# Patient Record
Sex: Female | Born: 1950 | Race: White | Hispanic: No | Smoking: Former smoker
Health system: Southern US, Community
[De-identification: ages and names within clinical notes are randomized; demographics above are authoritative.]

## PROBLEM LIST (undated history)

## (undated) DIAGNOSIS — J449 Chronic obstructive pulmonary disease, unspecified: Secondary | ICD-10-CM

## (undated) DIAGNOSIS — H9190 Unspecified hearing loss, unspecified ear: Secondary | ICD-10-CM

## (undated) DIAGNOSIS — C801 Malignant (primary) neoplasm, unspecified: Secondary | ICD-10-CM

## (undated) DIAGNOSIS — I10 Essential (primary) hypertension: Secondary | ICD-10-CM

## (undated) DIAGNOSIS — E119 Type 2 diabetes mellitus without complications: Secondary | ICD-10-CM

## (undated) DIAGNOSIS — M549 Dorsalgia, unspecified: Secondary | ICD-10-CM

## (undated) DIAGNOSIS — M5416 Radiculopathy, lumbar region: Secondary | ICD-10-CM

## (undated) DIAGNOSIS — G8929 Other chronic pain: Secondary | ICD-10-CM

## (undated) DIAGNOSIS — F419 Anxiety disorder, unspecified: Secondary | ICD-10-CM

## (undated) HISTORY — PX: TONSILLECTOMY: SUR1361

---

## 2000-08-24 ENCOUNTER — Encounter: Admission: RE | Admit: 2000-08-24 | Discharge: 2000-08-24 | Payer: Self-pay | Admitting: Internal Medicine

## 2000-09-06 ENCOUNTER — Ambulatory Visit (HOSPITAL_COMMUNITY): Admission: RE | Admit: 2000-09-06 | Discharge: 2000-09-06 | Payer: Self-pay | Admitting: Internal Medicine

## 2000-09-14 ENCOUNTER — Encounter: Admission: RE | Admit: 2000-09-14 | Discharge: 2000-09-14 | Payer: Self-pay | Admitting: Internal Medicine

## 2000-09-20 ENCOUNTER — Encounter: Admission: RE | Admit: 2000-09-20 | Discharge: 2000-09-27 | Payer: Self-pay | Admitting: Internal Medicine

## 2000-10-19 ENCOUNTER — Encounter: Admission: RE | Admit: 2000-10-19 | Discharge: 2000-10-19 | Payer: Self-pay | Admitting: Hematology and Oncology

## 2001-01-05 ENCOUNTER — Encounter: Admission: RE | Admit: 2001-01-05 | Discharge: 2001-01-05 | Payer: Self-pay | Admitting: Internal Medicine

## 2001-02-27 ENCOUNTER — Encounter: Payer: Self-pay | Admitting: Emergency Medicine

## 2001-02-27 ENCOUNTER — Emergency Department (HOSPITAL_COMMUNITY): Admission: EM | Admit: 2001-02-27 | Discharge: 2001-02-27 | Payer: Self-pay | Admitting: Emergency Medicine

## 2001-03-01 ENCOUNTER — Encounter: Admission: RE | Admit: 2001-03-01 | Discharge: 2001-03-01 | Payer: Self-pay | Admitting: Internal Medicine

## 2001-03-01 ENCOUNTER — Ambulatory Visit (HOSPITAL_COMMUNITY): Admission: RE | Admit: 2001-03-01 | Discharge: 2001-03-01 | Payer: Self-pay | Admitting: Internal Medicine

## 2001-05-17 ENCOUNTER — Encounter: Admission: RE | Admit: 2001-05-17 | Discharge: 2001-05-17 | Payer: Self-pay | Admitting: Internal Medicine

## 2001-06-12 ENCOUNTER — Emergency Department (HOSPITAL_COMMUNITY): Admission: EM | Admit: 2001-06-12 | Discharge: 2001-06-12 | Payer: Self-pay | Admitting: Emergency Medicine

## 2001-06-12 ENCOUNTER — Encounter: Payer: Self-pay | Admitting: *Deleted

## 2001-08-05 ENCOUNTER — Emergency Department (HOSPITAL_COMMUNITY): Admission: EM | Admit: 2001-08-05 | Discharge: 2001-08-05 | Payer: Self-pay | Admitting: Emergency Medicine

## 2001-08-27 ENCOUNTER — Encounter: Admission: RE | Admit: 2001-08-27 | Discharge: 2001-08-27 | Payer: Self-pay | Admitting: Internal Medicine

## 2001-08-30 ENCOUNTER — Emergency Department (HOSPITAL_COMMUNITY): Admission: EM | Admit: 2001-08-30 | Discharge: 2001-08-30 | Payer: Self-pay

## 2001-11-12 ENCOUNTER — Encounter: Admission: RE | Admit: 2001-11-12 | Discharge: 2001-11-12 | Payer: Self-pay | Admitting: Internal Medicine

## 2002-06-07 ENCOUNTER — Encounter: Admission: RE | Admit: 2002-06-07 | Discharge: 2002-06-07 | Payer: Self-pay | Admitting: Internal Medicine

## 2002-07-08 ENCOUNTER — Encounter: Admission: RE | Admit: 2002-07-08 | Discharge: 2002-07-08 | Payer: Self-pay | Admitting: Internal Medicine

## 2002-09-09 ENCOUNTER — Emergency Department (HOSPITAL_COMMUNITY): Admission: EM | Admit: 2002-09-09 | Discharge: 2002-09-09 | Payer: Self-pay | Admitting: Emergency Medicine

## 2002-11-18 ENCOUNTER — Encounter: Admission: RE | Admit: 2002-11-18 | Discharge: 2002-11-18 | Payer: Self-pay | Admitting: Internal Medicine

## 2003-10-02 ENCOUNTER — Encounter: Admission: RE | Admit: 2003-10-02 | Discharge: 2003-10-02 | Payer: Self-pay | Admitting: Internal Medicine

## 2004-02-02 ENCOUNTER — Emergency Department (HOSPITAL_COMMUNITY): Admission: EM | Admit: 2004-02-02 | Discharge: 2004-02-02 | Payer: Self-pay | Admitting: Emergency Medicine

## 2004-02-02 IMAGING — CR DG KNEE COMPLETE 4+V*R*
4 series · 4 of 4 positions shown · non-contrast
Comparison: none

CLINICAL DATA: Right knee pain.  
 RIGHT KNEE FOUR VIEWS
 Mild to moderate degenerative changes with joint space narrowing, osteophytosis in all three compartments.  Minimal irregularity of the tibial spines, likely degenerative.  Small knee effusion is present.  No acute fracture, subluxation or dislocation.  
 IMPRESSION
 Mild to moderate degenerative changes in the right knee with small knee effusion.  No definite acute bony abnormality.  Consider further evaluation or follow-up as indicated.

[view not recorded (1 of 4)]
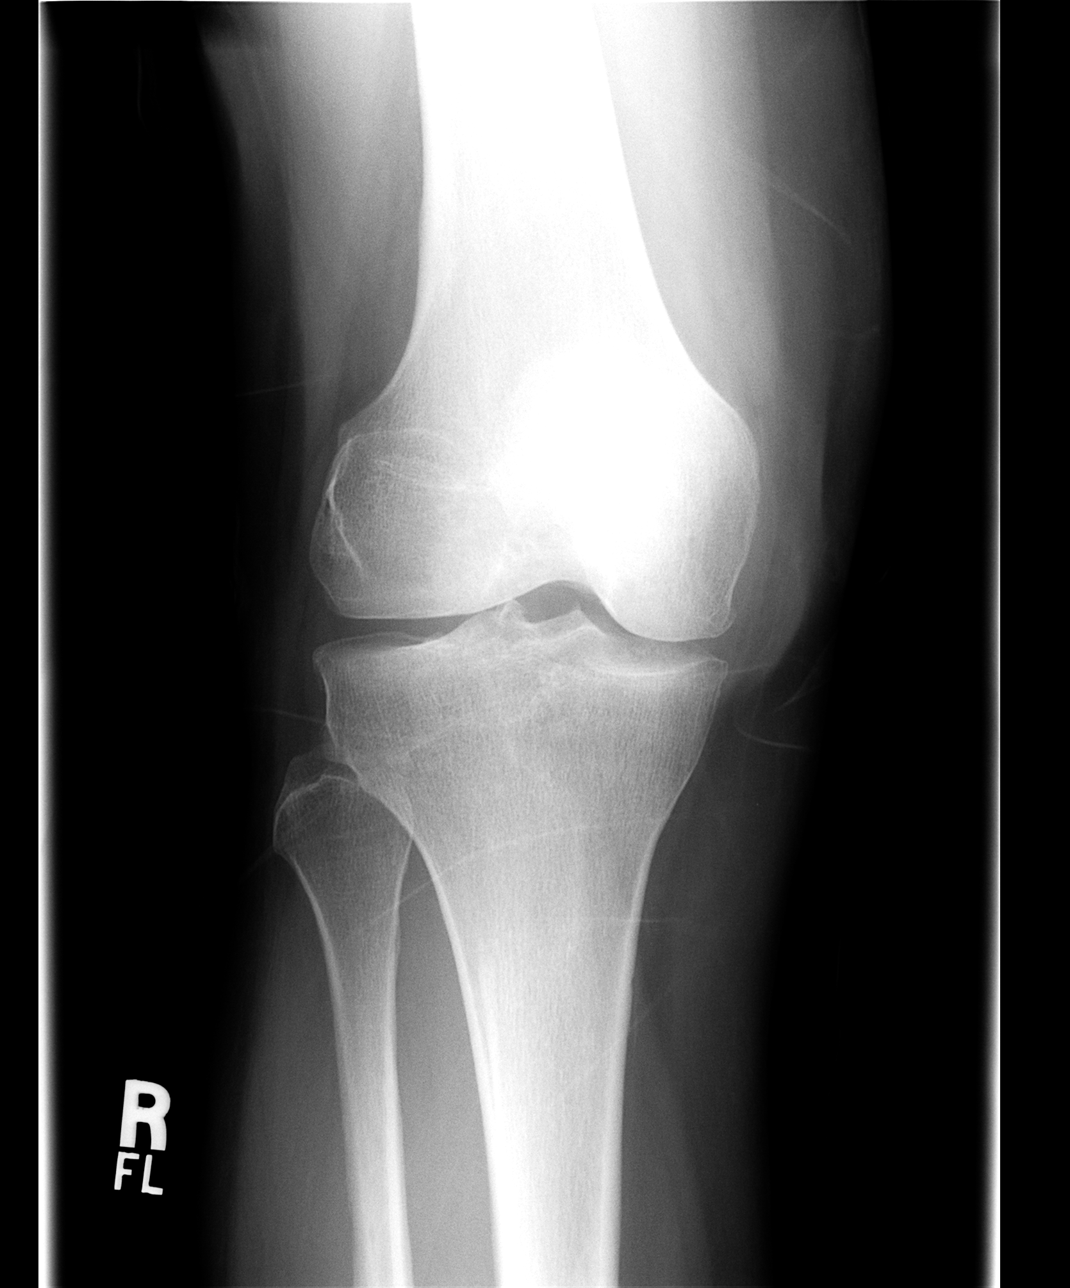

[view not recorded (2 of 4)]
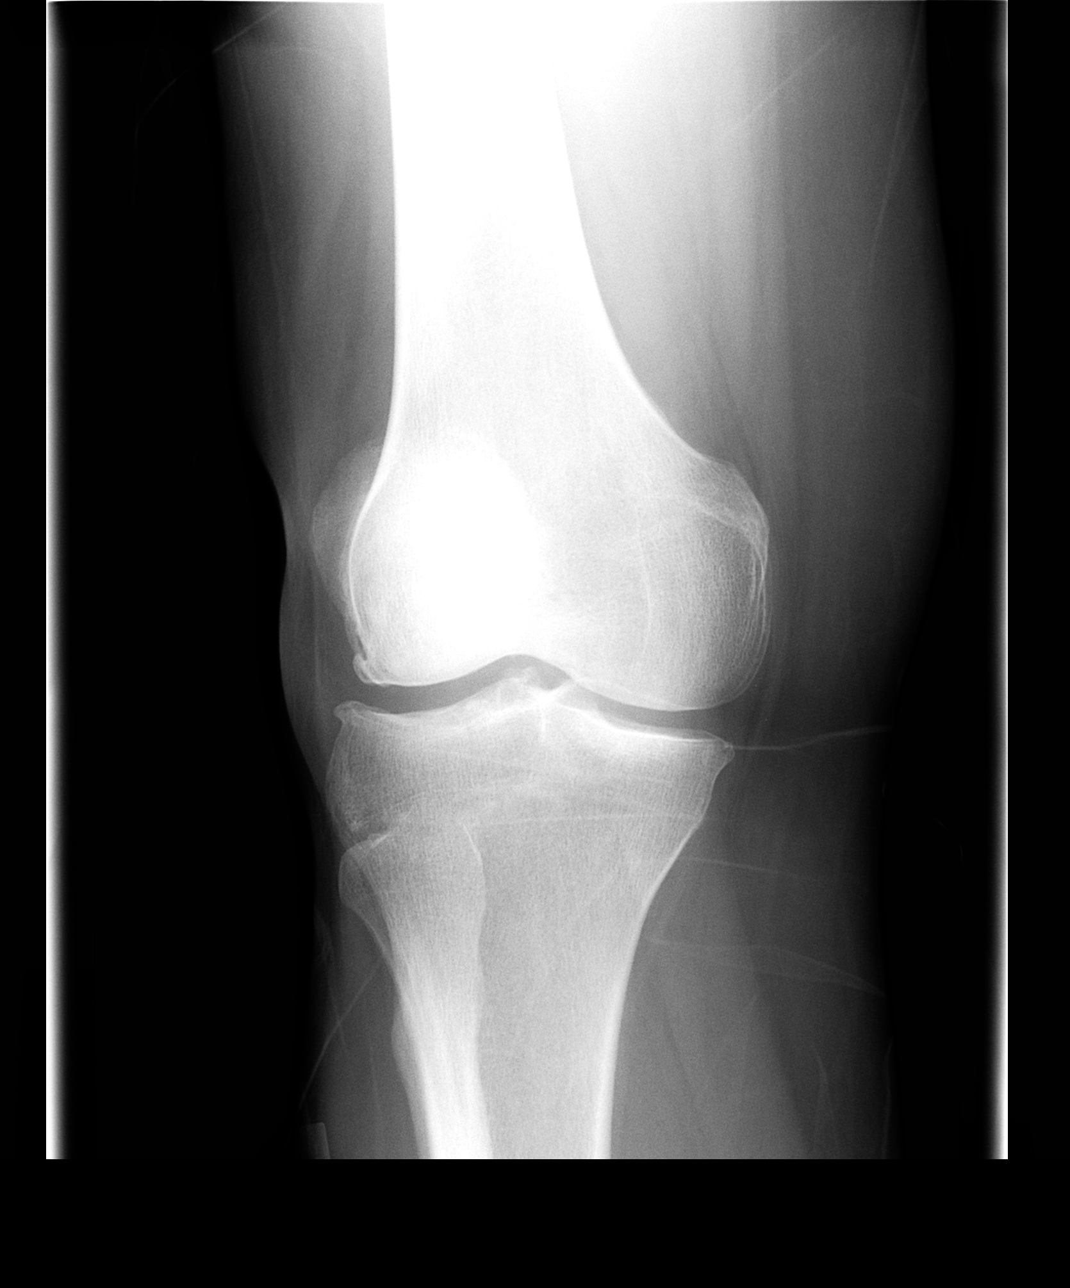

[view not recorded (3 of 4)]
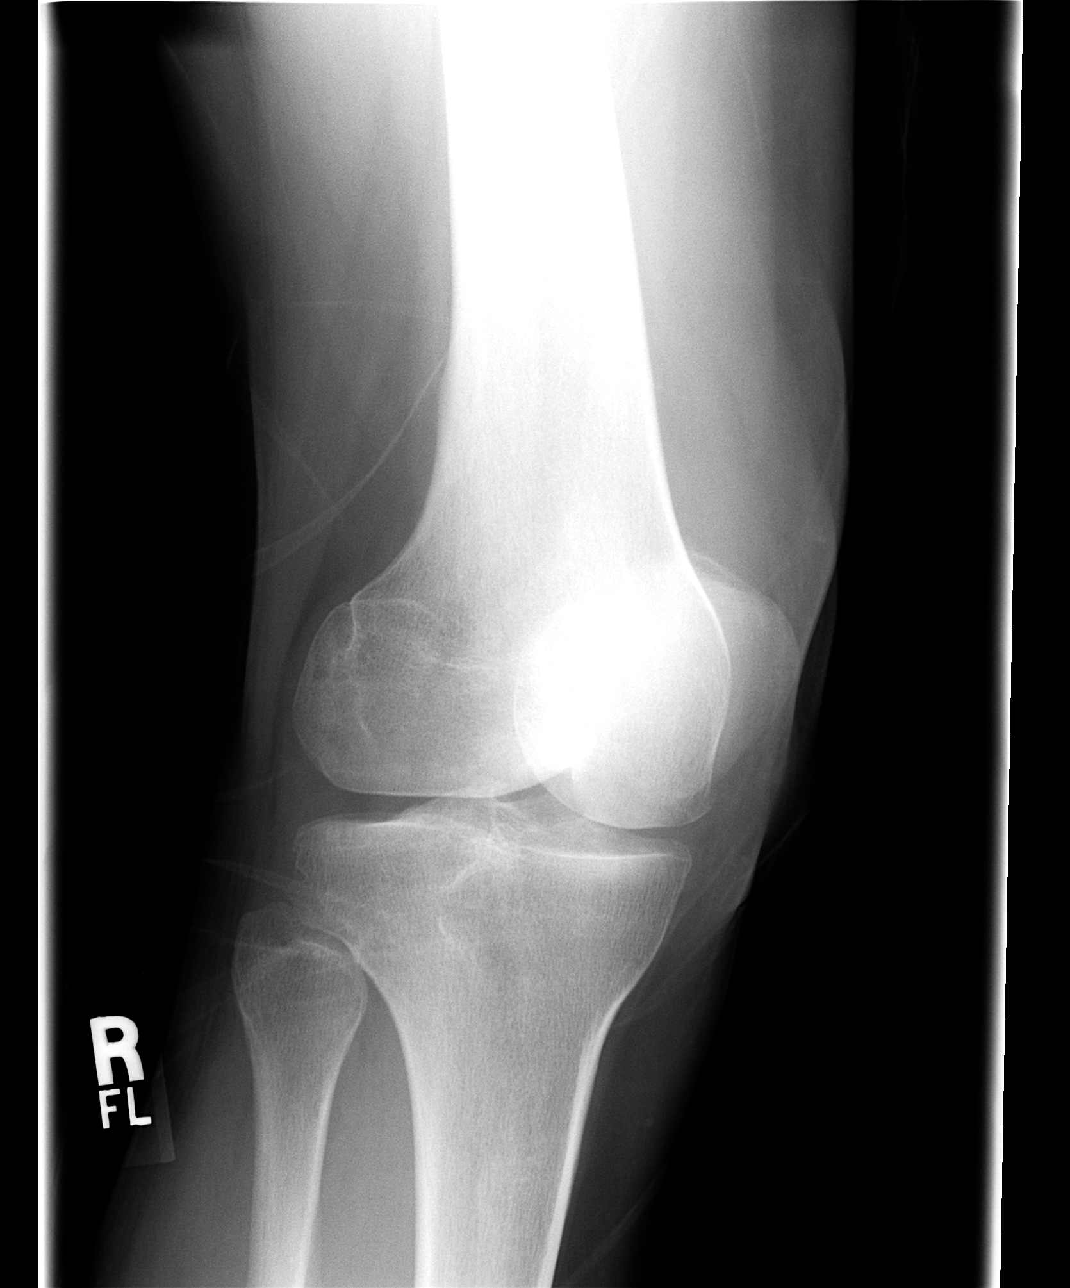

[view not recorded (4 of 4)]
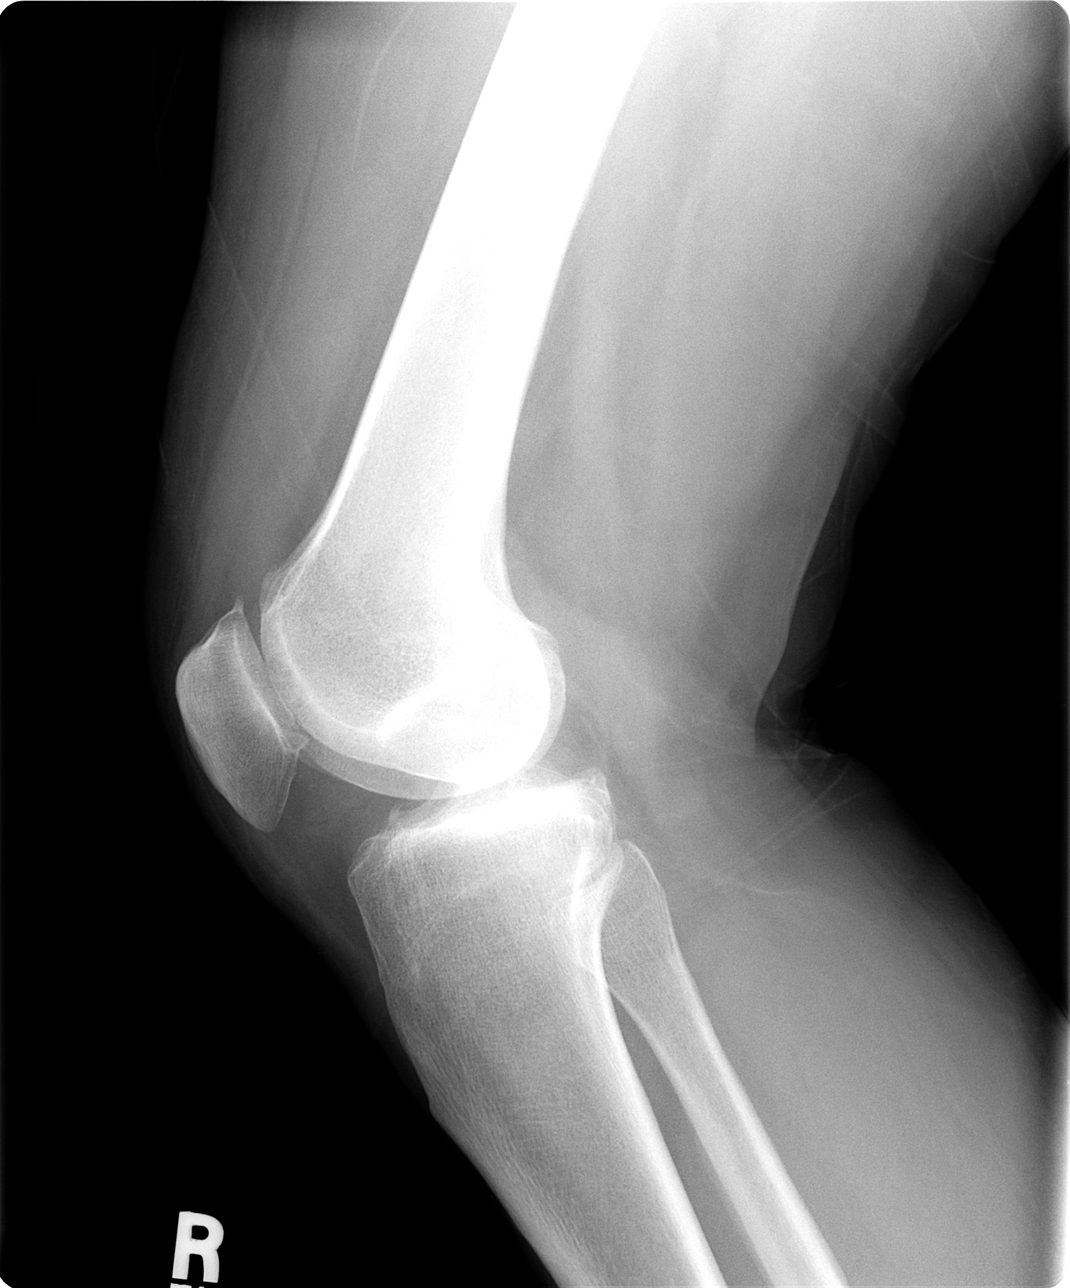

[4 of 4 positions shown; findings below may reference images not displayed]

## 2004-06-21 ENCOUNTER — Ambulatory Visit (HOSPITAL_COMMUNITY): Admission: RE | Admit: 2004-06-21 | Discharge: 2004-06-21 | Payer: Self-pay | Admitting: Internal Medicine

## 2004-06-21 IMAGING — CR DG LUMBAR SPINE COMPLETE 4+V
5 series · 5 of 5 positions shown · non-contrast
Comparison: none

CLINICAL DATA: Low back and knee pain.
RIGHT KNEE FOUR VIEW
There are mild degenerative changes present with early joint space narrowing and osteophyte formation.  Small joint effusion present.  No acute bony abnormality.  Specifically, no evidence of fracture, subluxation, or dislocation.  Degenerative changes are stable since [DATE].  Previously described small joint effusion has decreased on today?s study.
IMPRESSION
Early degenerative changes.  Small joint effusion.  No acute bony abnormality.
LUMBAR SPINE FOUR VIEWS
There are degenerative disc changes with disc space narrowing and osteophyte formation most pronounced at L2-3 and L5-S1.  Early vacuum disc noted at these levels as well.  No evidence of fracture or malalignment.  
Degenerative disc disease L2-3 and L5-S1.  No acute bony abnormality.

[view not recorded (1 of 5)]
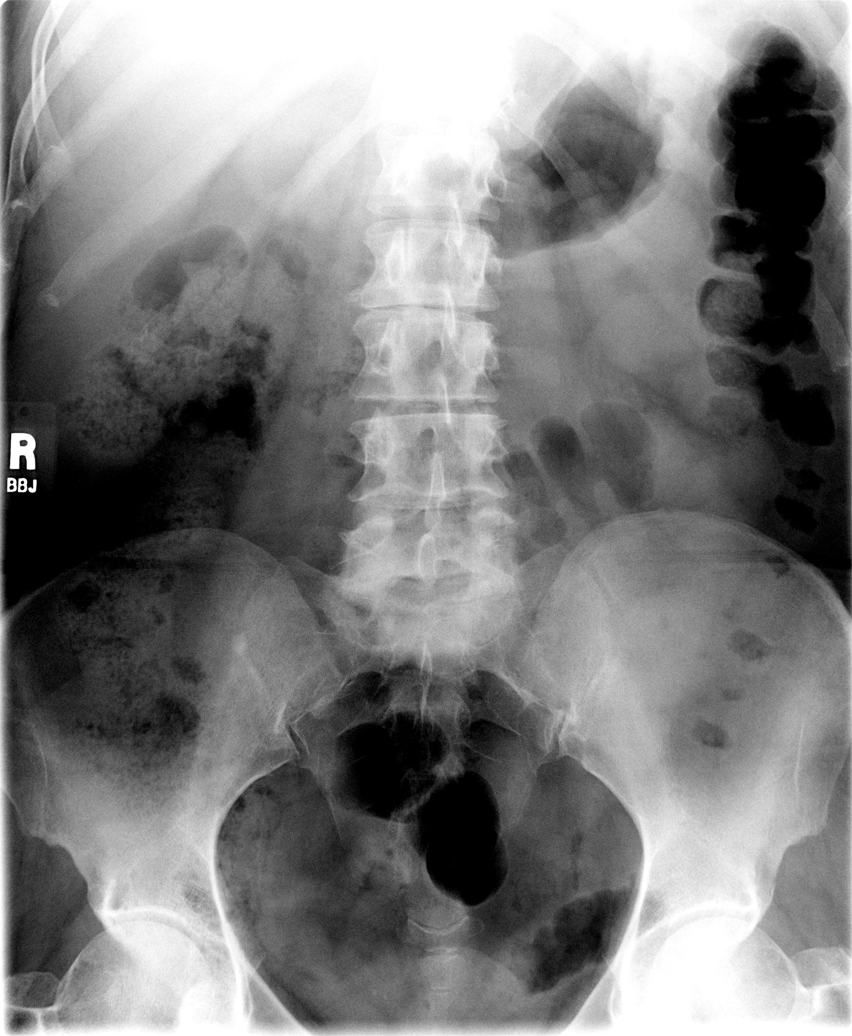

[view not recorded (2 of 5)]
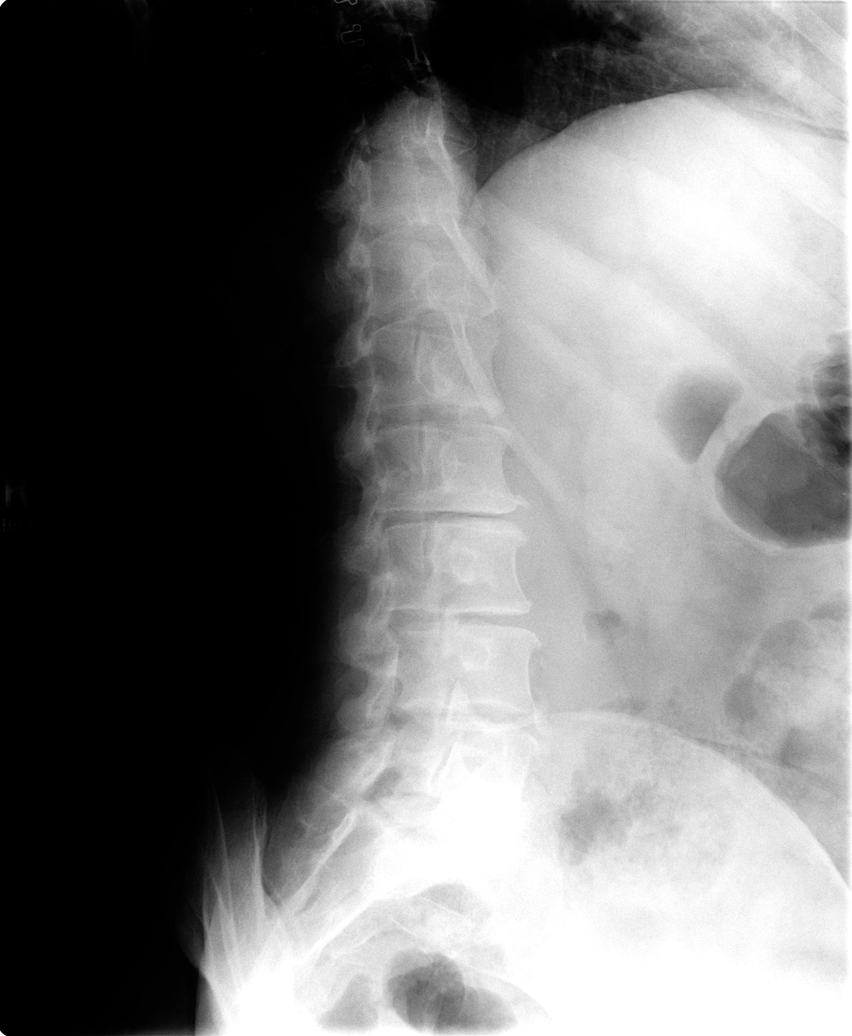

[view not recorded (3 of 5)]
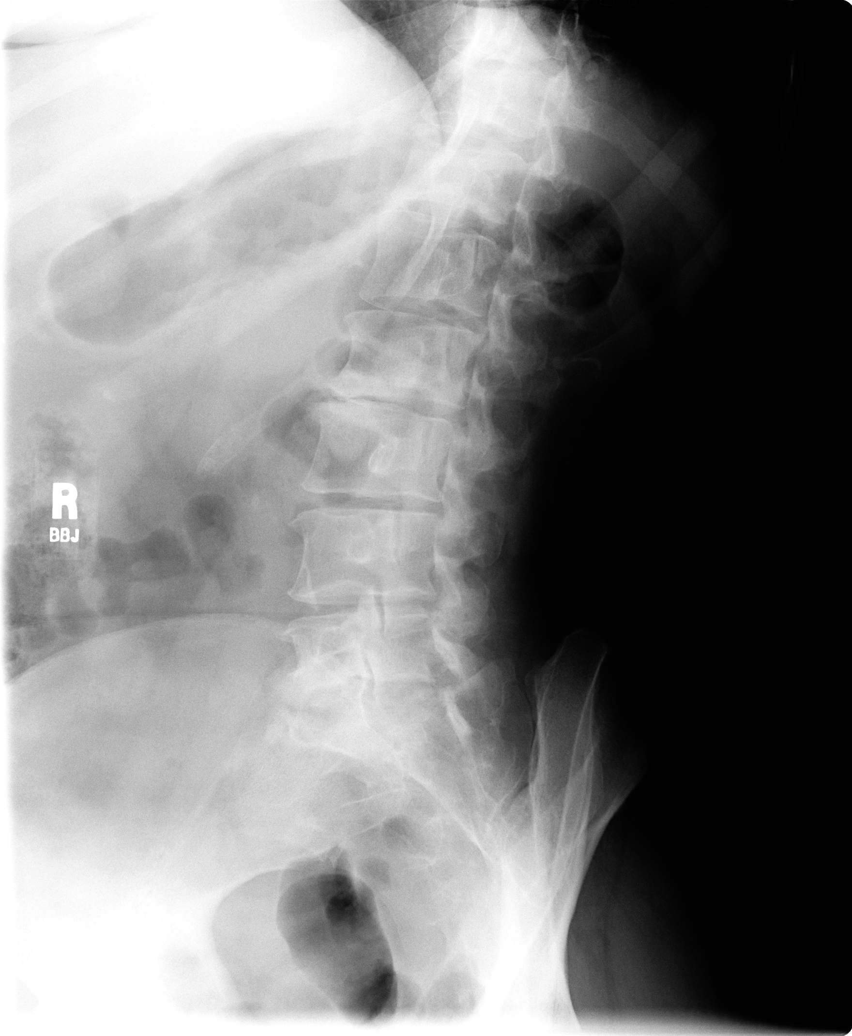

[view not recorded (4 of 5)]
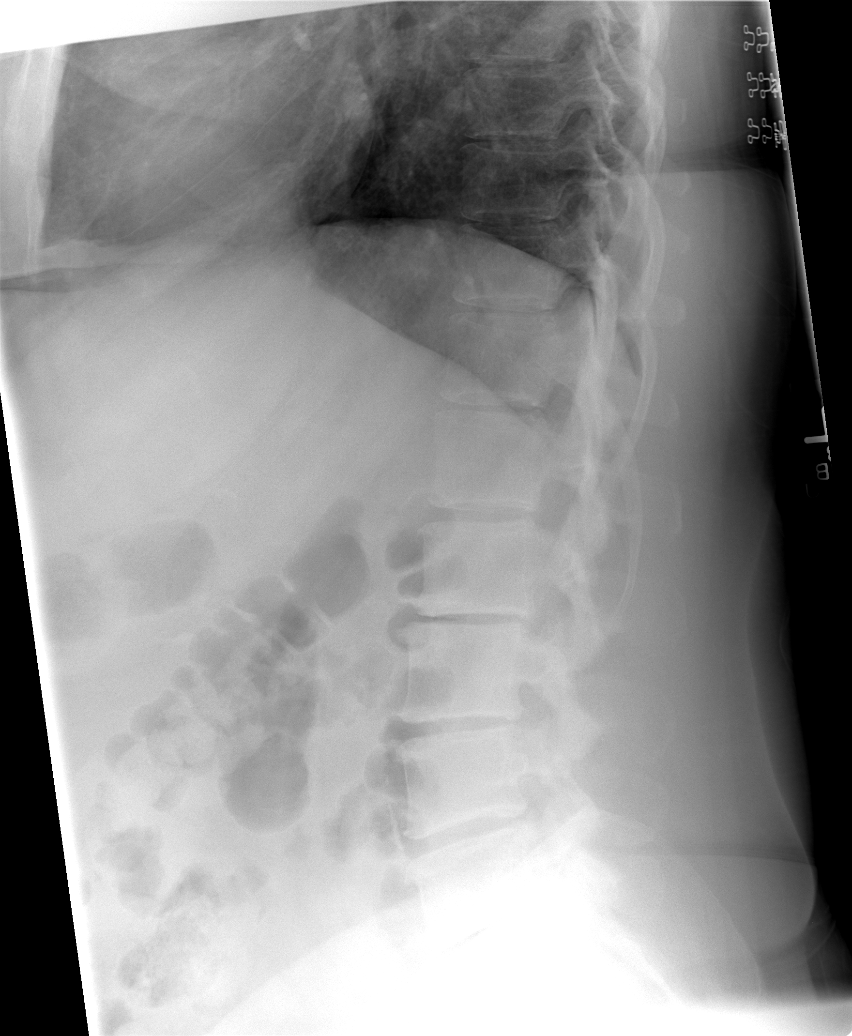

[view not recorded (5 of 5)]
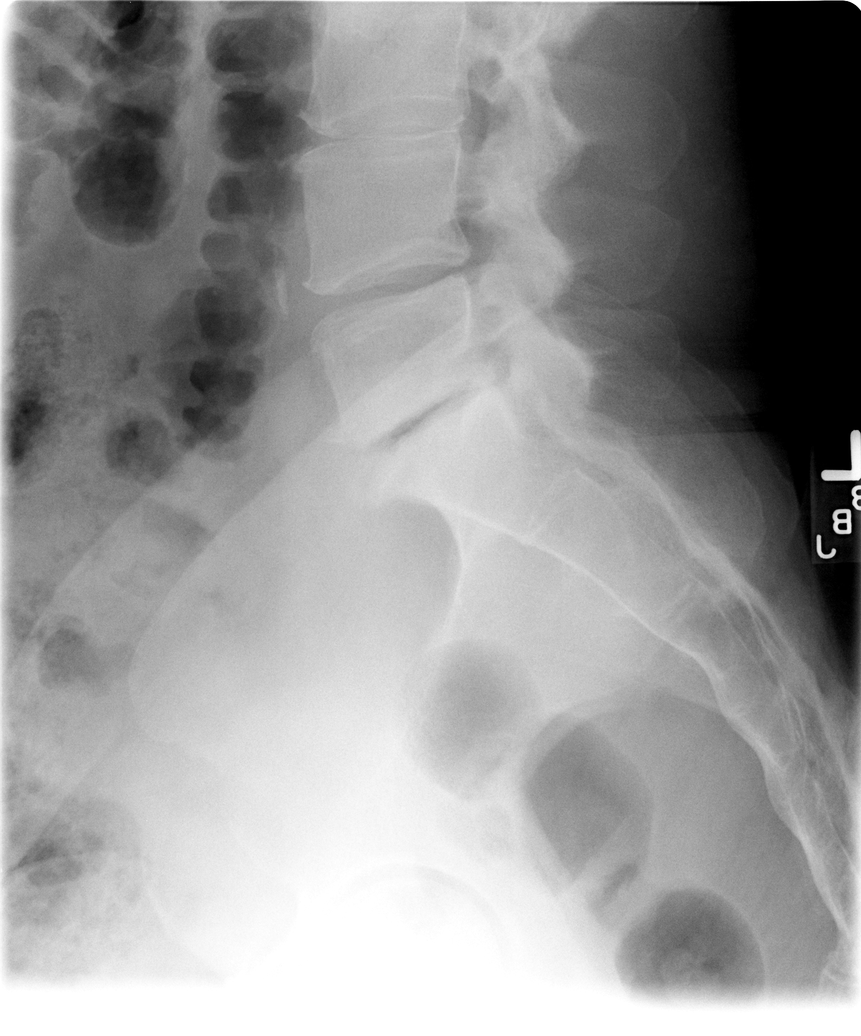

[5 of 5 positions shown; findings below may reference images not displayed]

## 2004-06-21 IMAGING — CR DG KNEE COMPLETE 4+V*R*
4 series · 4 of 4 positions shown · non-contrast
Comparison: none

CLINICAL DATA: Low back and knee pain.
RIGHT KNEE FOUR VIEW
There are mild degenerative changes present with early joint space narrowing and osteophyte formation.  Small joint effusion present.  No acute bony abnormality.  Specifically, no evidence of fracture, subluxation, or dislocation.  Degenerative changes are stable since [DATE].  Previously described small joint effusion has decreased on today?s study.
IMPRESSION
Early degenerative changes.  Small joint effusion.  No acute bony abnormality.
LUMBAR SPINE FOUR VIEWS
There are degenerative disc changes with disc space narrowing and osteophyte formation most pronounced at L2-3 and L5-S1.  Early vacuum disc noted at these levels as well.  No evidence of fracture or malalignment.  
Degenerative disc disease L2-3 and L5-S1.  No acute bony abnormality.

[view not recorded (1 of 4)]
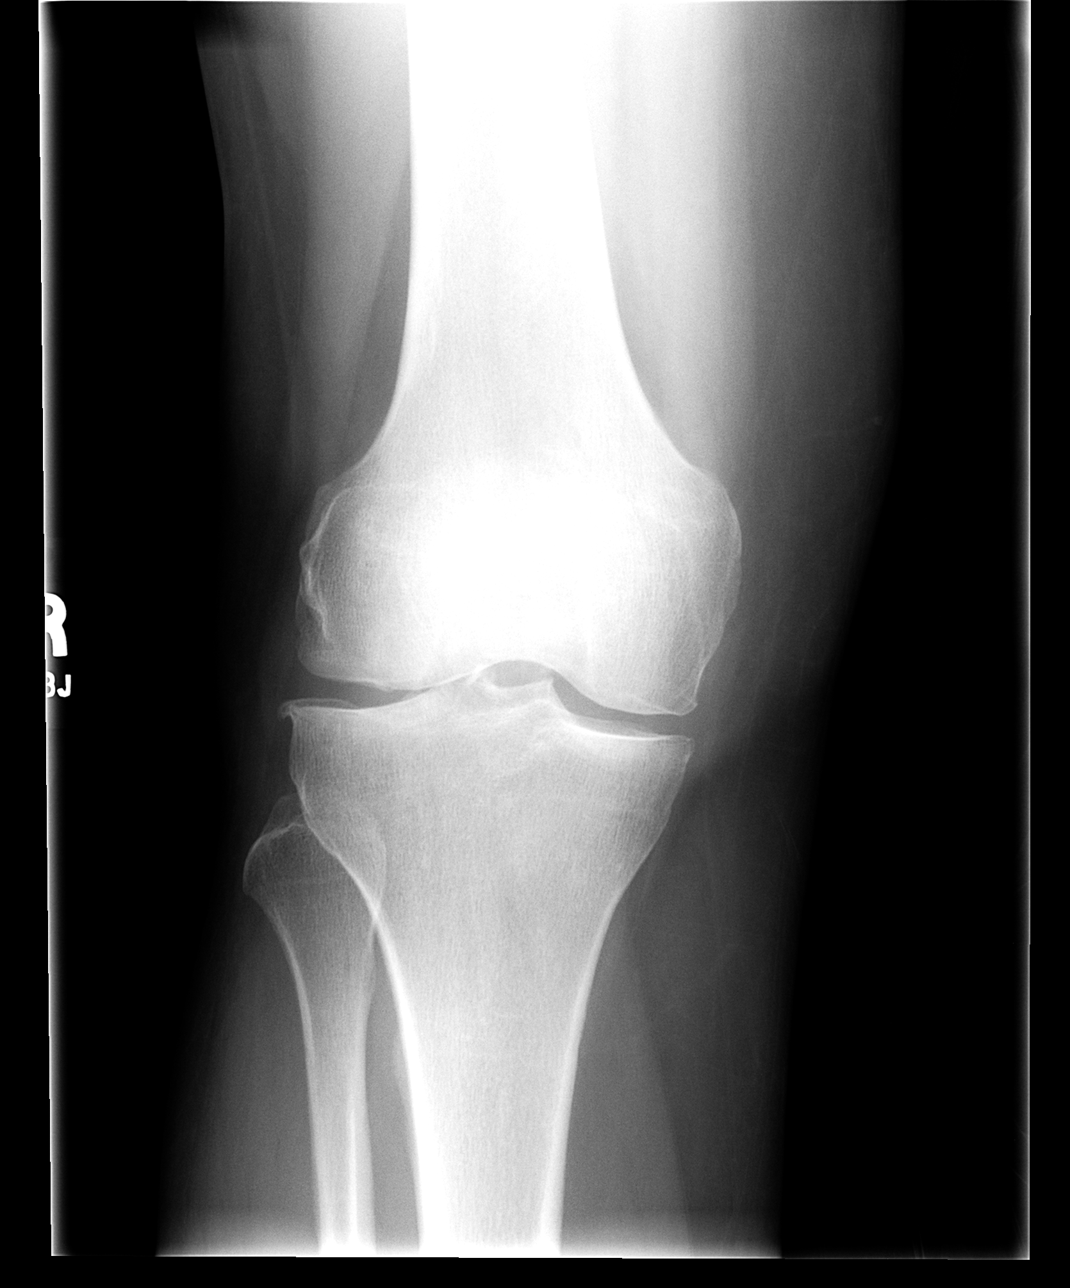

[view not recorded (2 of 4)]
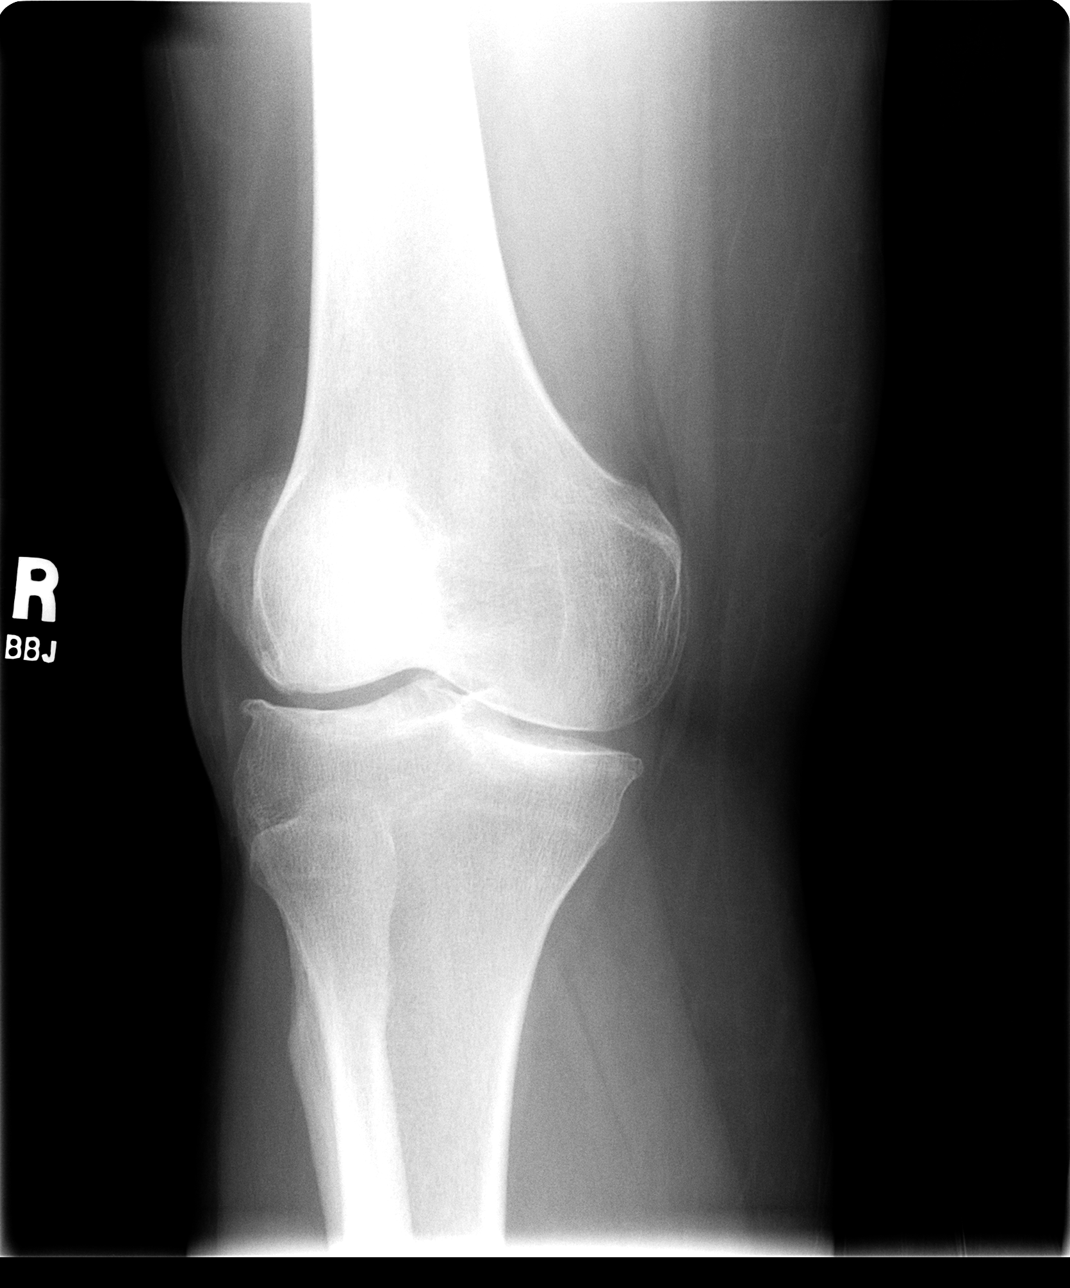

[view not recorded (3 of 4)]
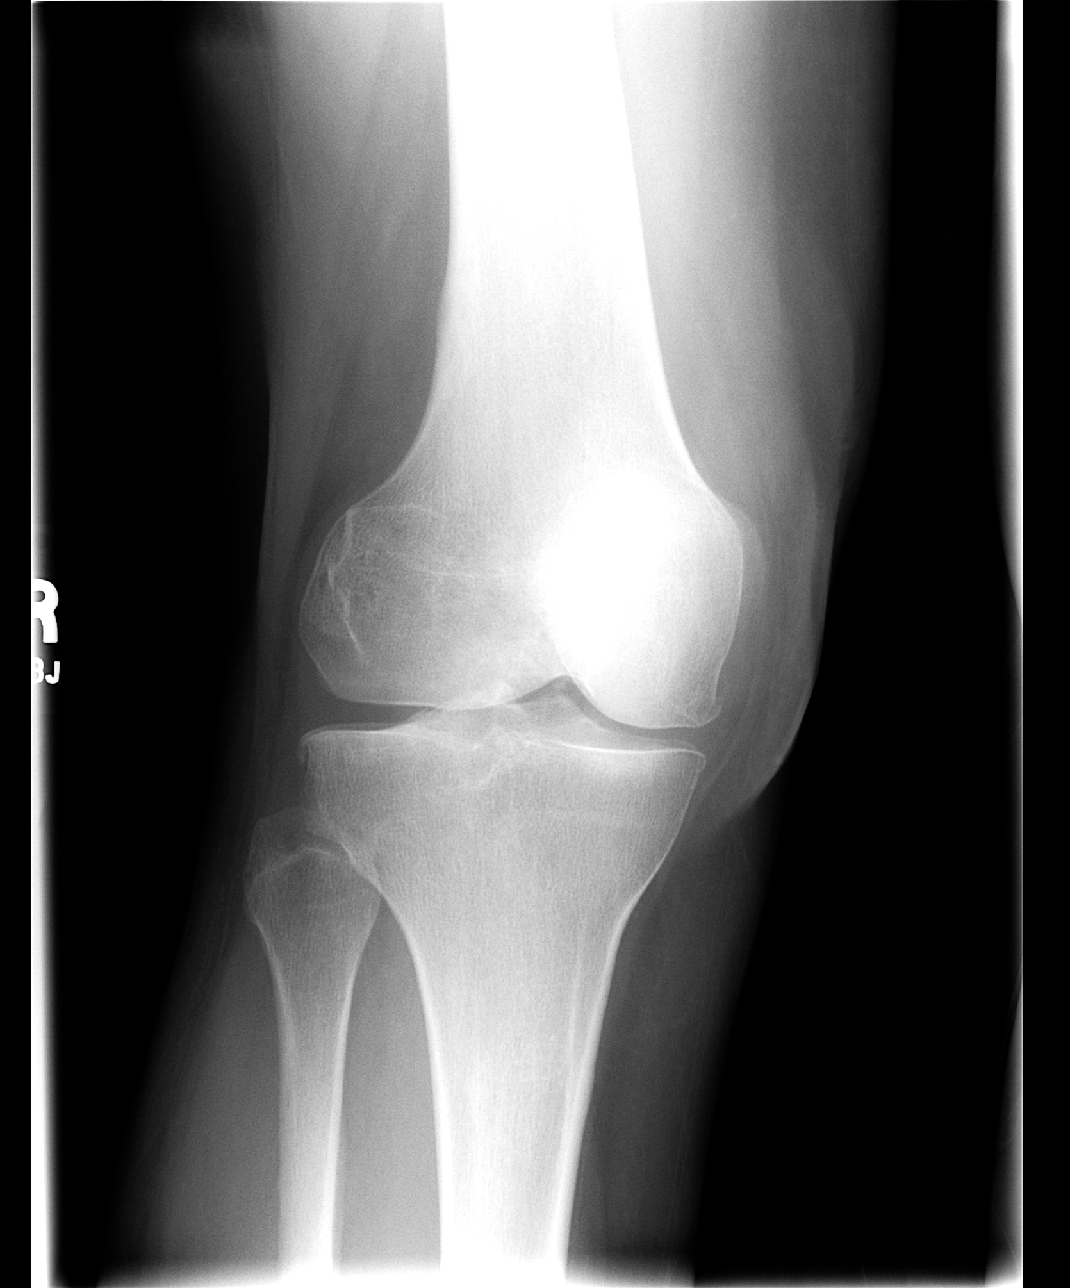

[view not recorded (4 of 4)]
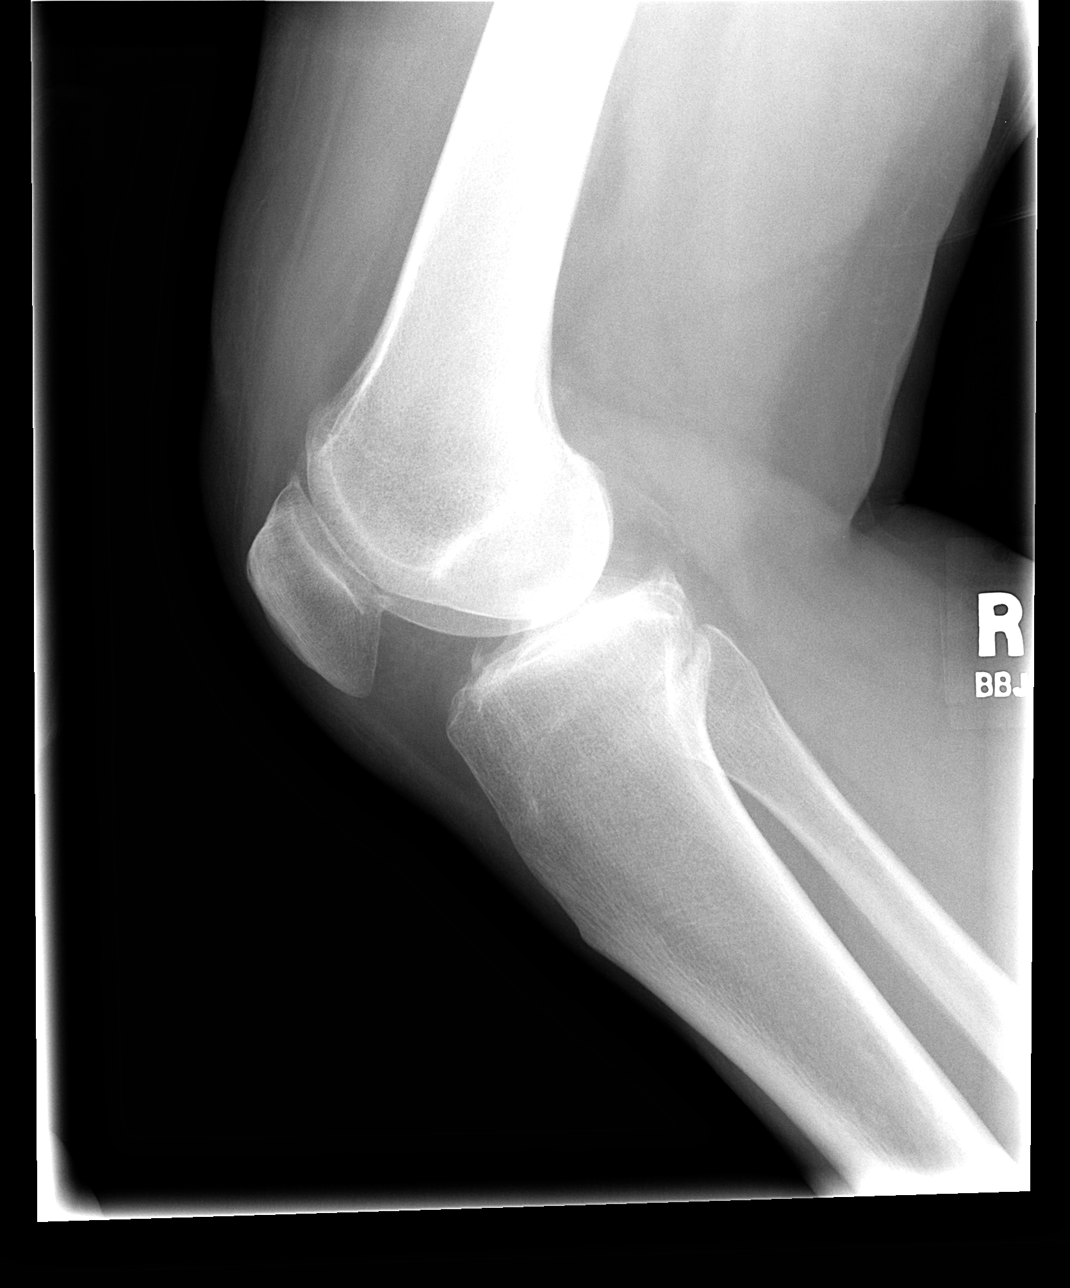

[4 of 4 positions shown; findings below may reference images not displayed]

## 2004-08-31 ENCOUNTER — Emergency Department (HOSPITAL_COMMUNITY): Admission: EM | Admit: 2004-08-31 | Discharge: 2004-08-31 | Payer: Self-pay | Admitting: Emergency Medicine

## 2004-08-31 IMAGING — CR DG RIBS W/ CHEST 3+V*L*
4 series · 4 of 4 positions shown · non-contrast
Comparison: none

CLINICAL DATA: Left rib pain, status post fall.
 LEFT RIBS WITH CHEST ? [DATE] 
 The heart and mediastinal contours are within normal limits.  The lungs are clear.  No effusions.  
 No left-sided rib fracture is identified.  
 IMPRESSION
 No evidence of left rib fracture.
 CERVICAL SPINE ? 5 VIEWS ? [DATE] 
 There are degenerative disk disease changes most pronounced at C5-6 and C6-7 with disk space narrowing and osteophyte formation.  Left-sided neural foraminal narrowing is noted at C5-6 and C6-7.  No acute bony abnormality.  Specifically no evidence of fracture or malalignment.
 1.  Significant cervical spondylosis particularly at C5-6 and C6-7.  This does cause left-sided neural foraminal narrowing at these levels.
 2.  No acute bony abnormality.

[view not recorded (1 of 4)]
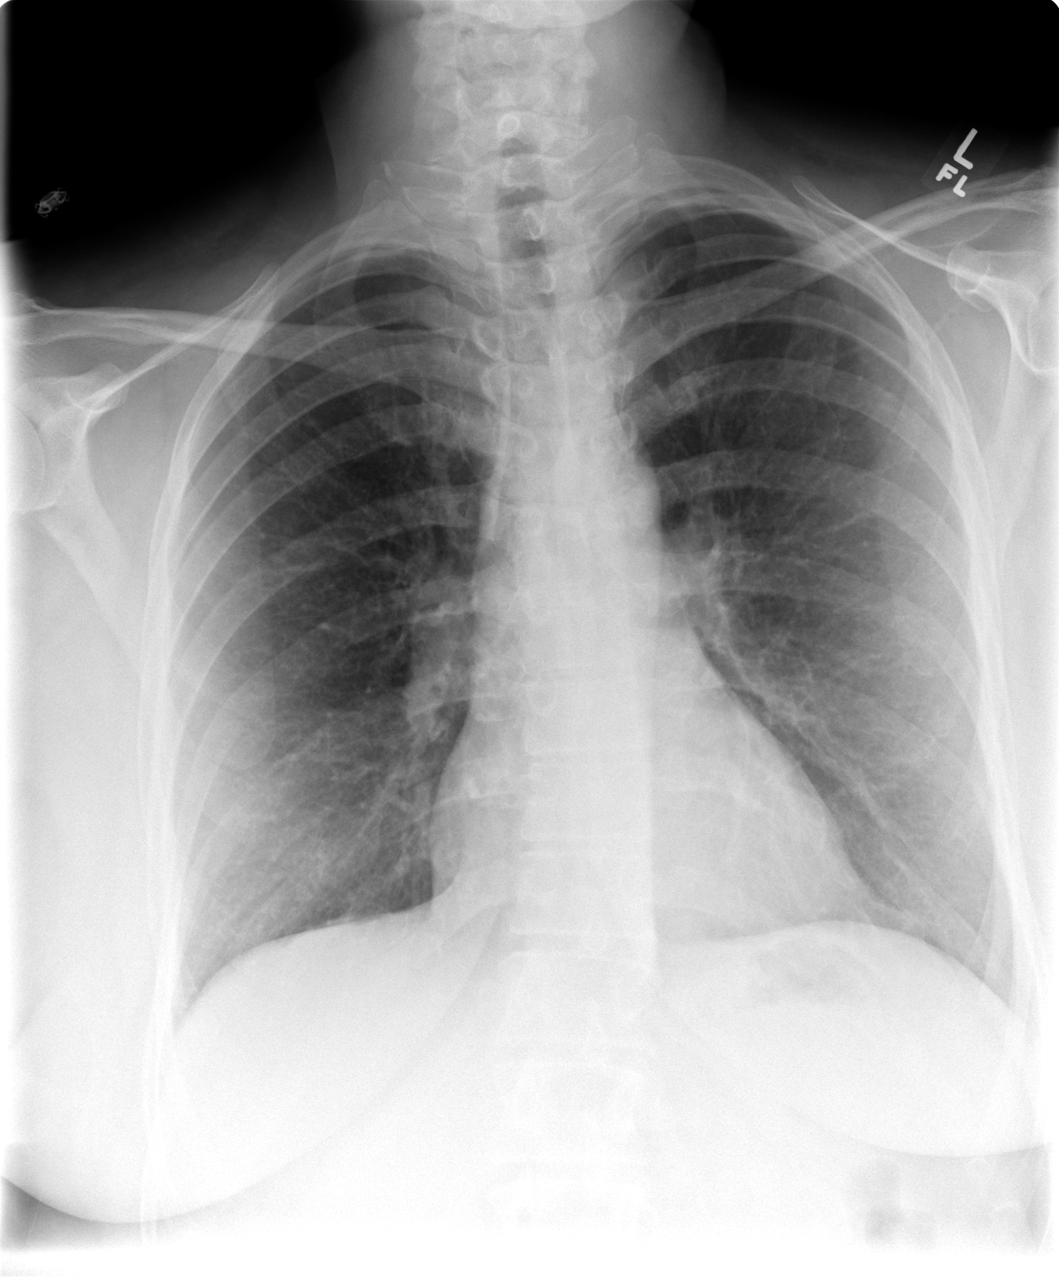

[view not recorded (2 of 4)]
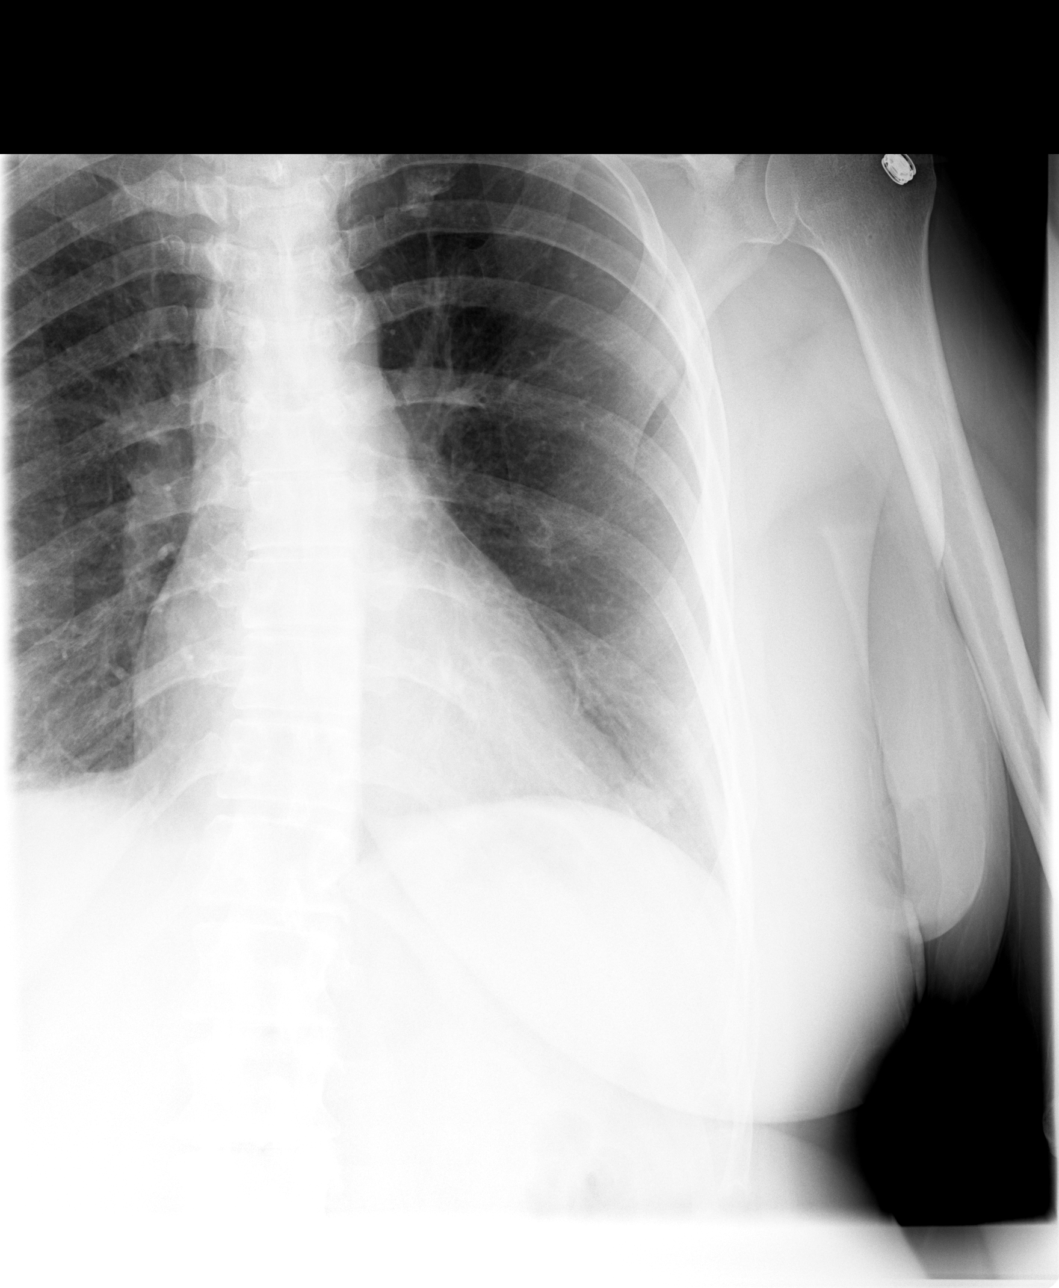

[view not recorded (3 of 4)]
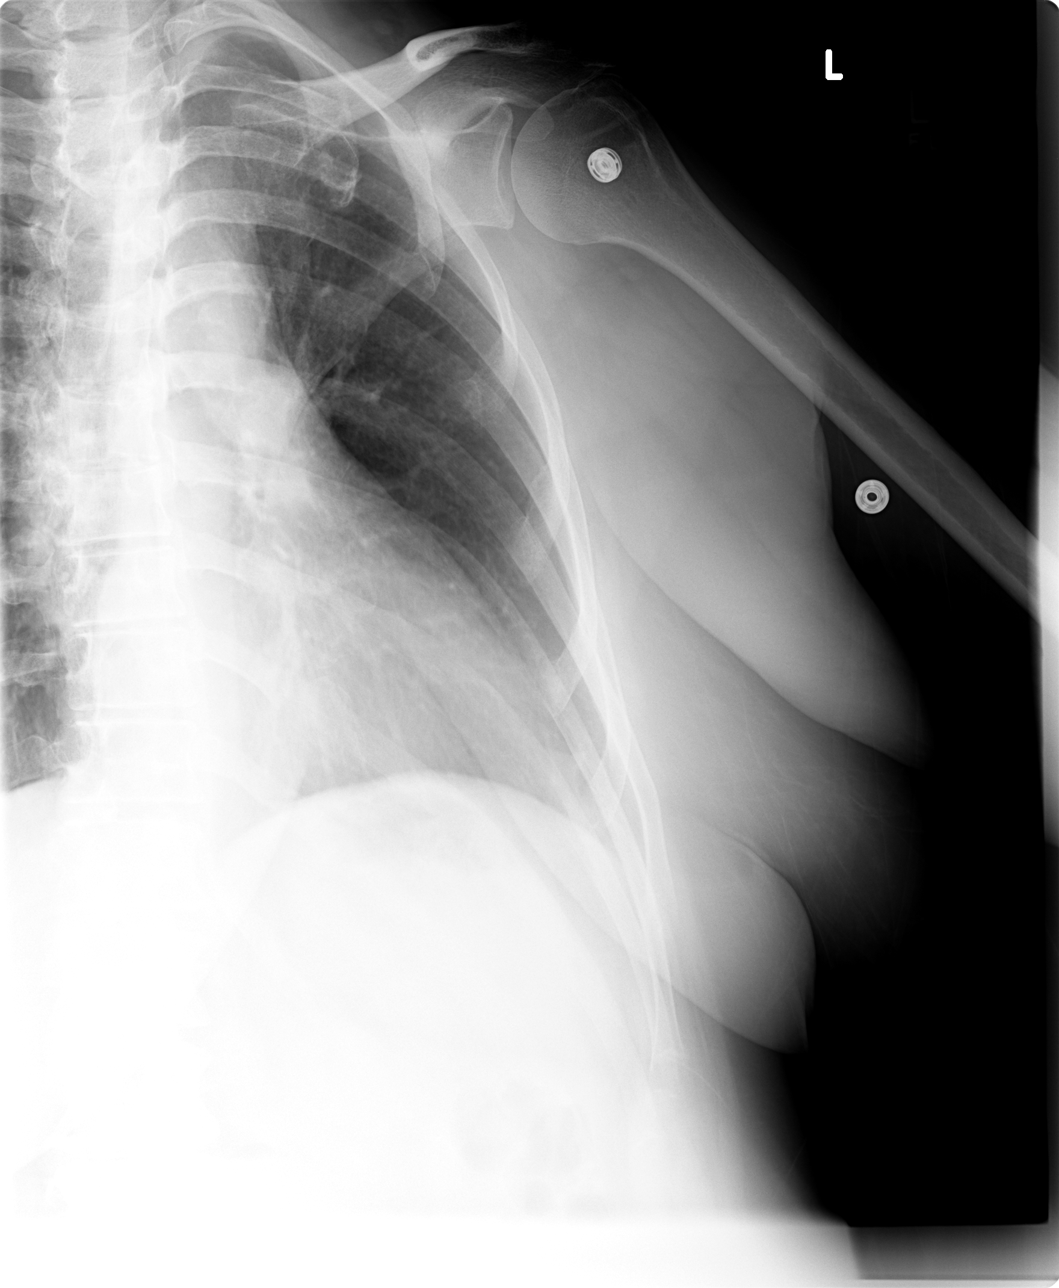

[view not recorded (4 of 4)]
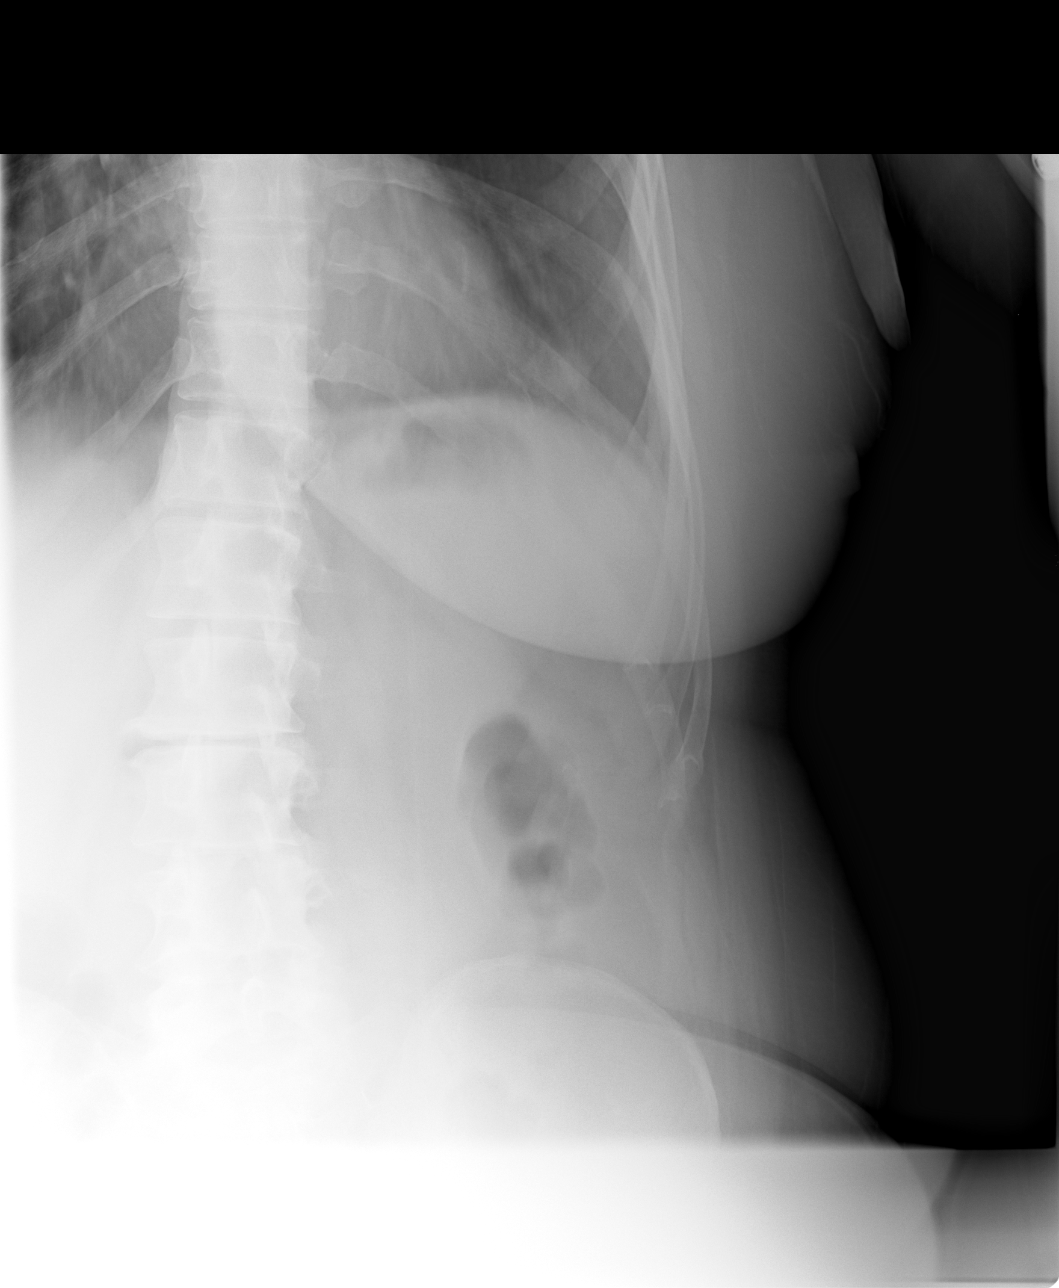

[4 of 4 positions shown; findings below may reference images not displayed]

## 2004-08-31 IMAGING — CR DG SHOULDER 2+V*L*
3 series · 3 of 3 positions shown · non-contrast
Comparison: none

CLINICAL DATA: Status post fall with left shoulder pain.
 LEFT SHOULDER (THREE VIEWS)
 There is significant degenerative changes at the left acromioclavicular joint.  Irregularity within the humeral head at the rotator cuff insertion is felt to represent degenerative changes as well rather than acute bony abnormality.  No evidence of fracture, subluxation or dislocation.
CLINICAL DATA: Fell.  Hip pain.

 PELVIS (ONE VIEW)
 There is no evidence of fracture or diastasis. No other significant bone or soft tissue abnormalities are identified.
 IMPRESSION
 Normal study.

[view not recorded (1 of 3)]
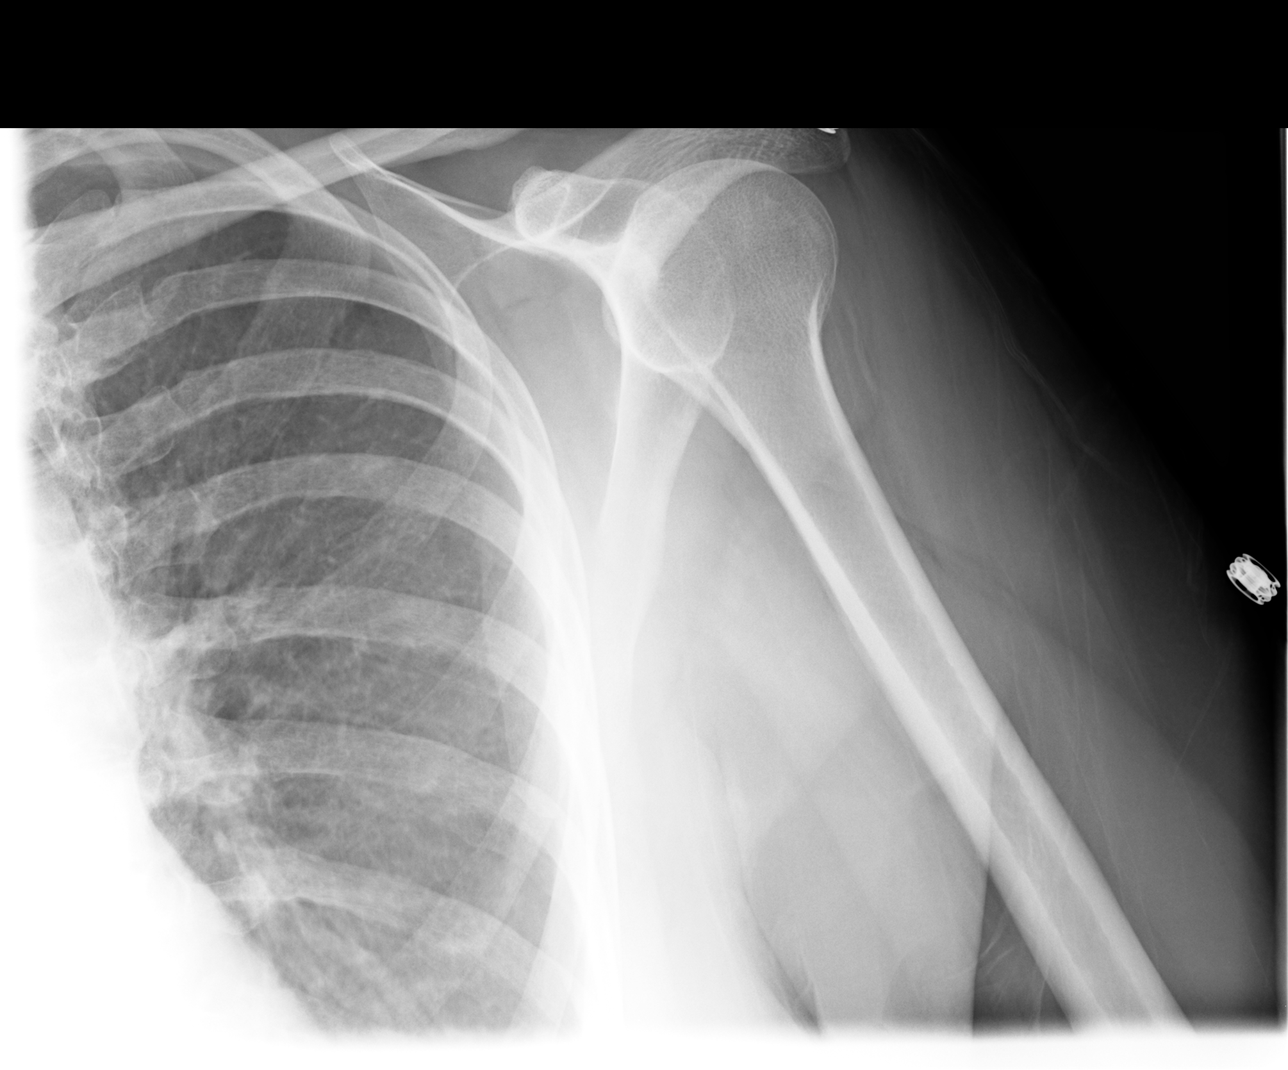

[view not recorded (2 of 3)]
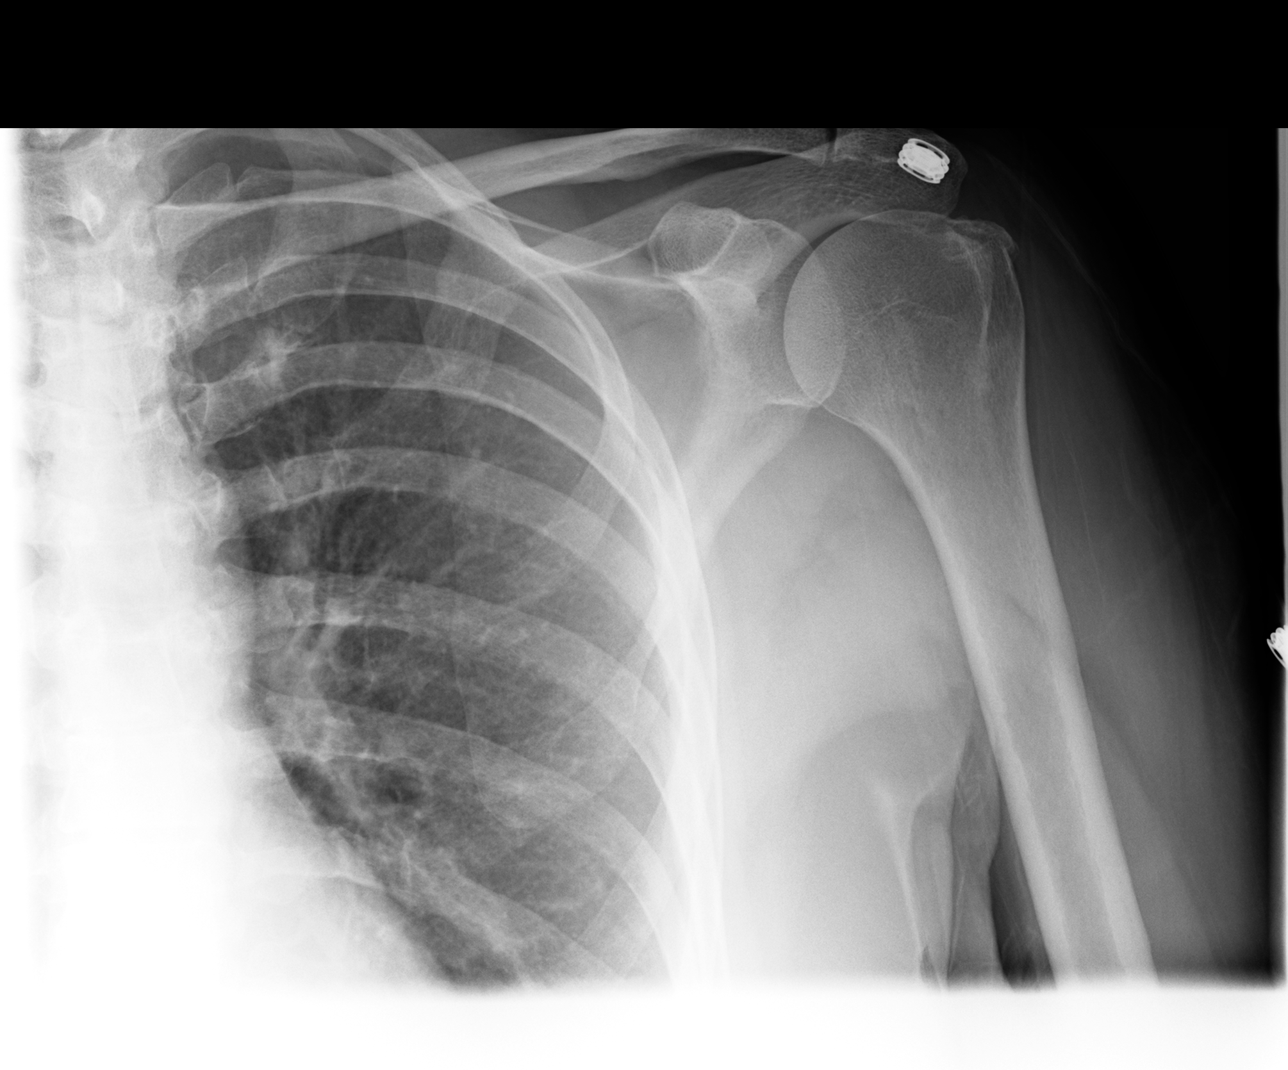

[view not recorded (3 of 3)]
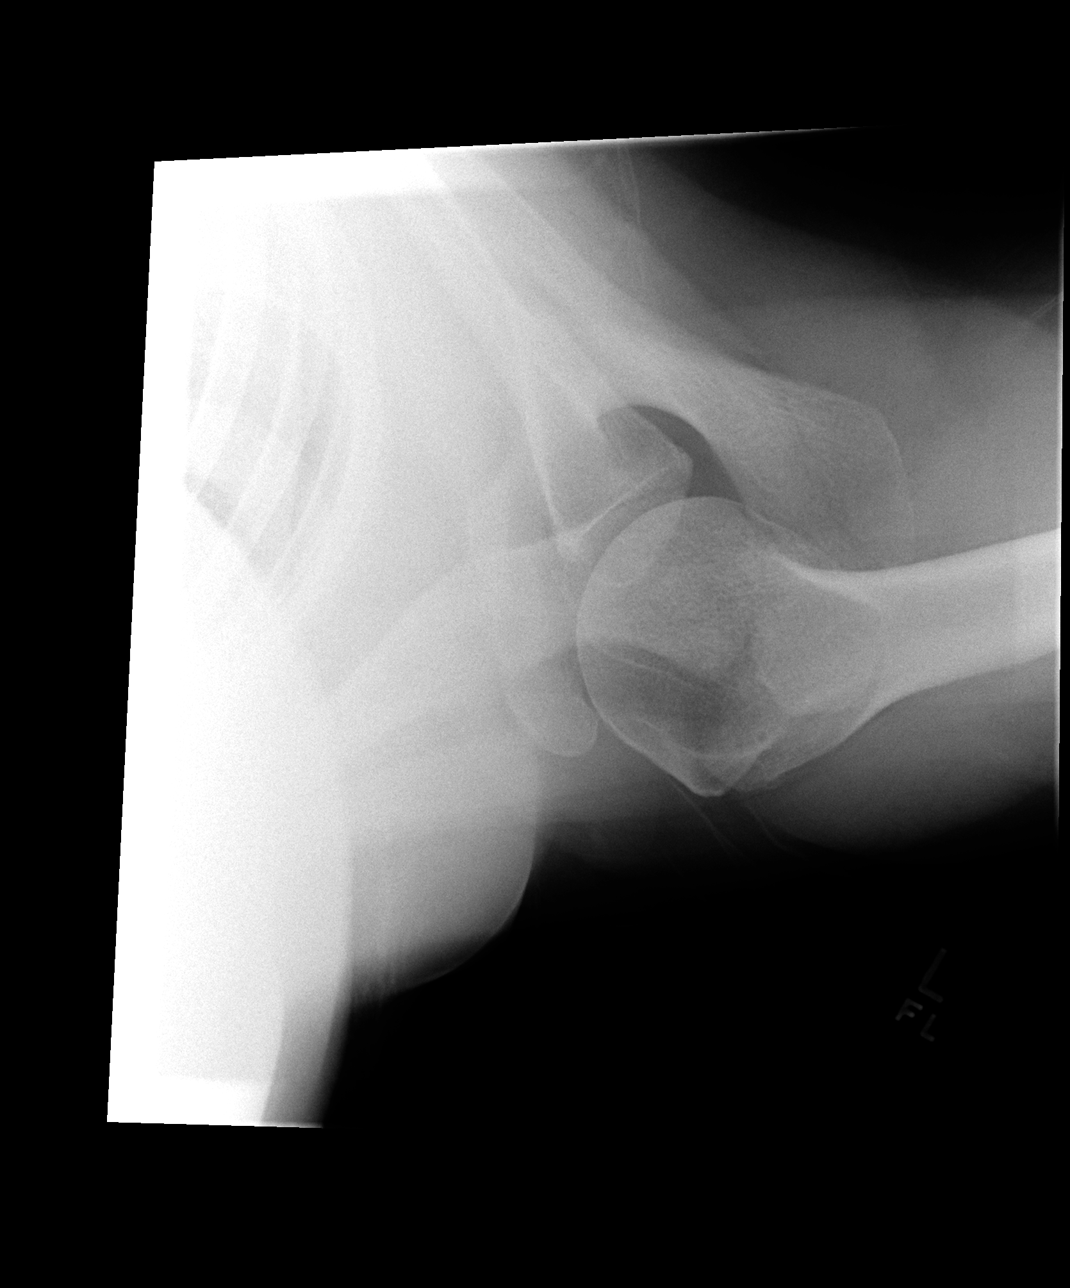

[3 of 3 positions shown; findings below may reference images not displayed]

IMPRESSION: Degenerative changes in the left AC joint.  No acute bony abnormality.

## 2004-08-31 IMAGING — CR DG CERVICAL SPINE COMPLETE 4+V
6 series · 6 of 6 positions shown · non-contrast
Comparison: none

CLINICAL DATA: Left rib pain, status post fall.
 LEFT RIBS WITH CHEST ? [DATE] 
 The heart and mediastinal contours are within normal limits.  The lungs are clear.  No effusions.  
 No left-sided rib fracture is identified.  
 IMPRESSION
 No evidence of left rib fracture.
 CERVICAL SPINE ? 5 VIEWS ? [DATE] 
 There are degenerative disk disease changes most pronounced at C5-6 and C6-7 with disk space narrowing and osteophyte formation.  Left-sided neural foraminal narrowing is noted at C5-6 and C6-7.  No acute bony abnormality.  Specifically no evidence of fracture or malalignment.
 1.  Significant cervical spondylosis particularly at C5-6 and C6-7.  This does cause left-sided neural foraminal narrowing at these levels.
 2.  No acute bony abnormality.

[view not recorded (1 of 6)]
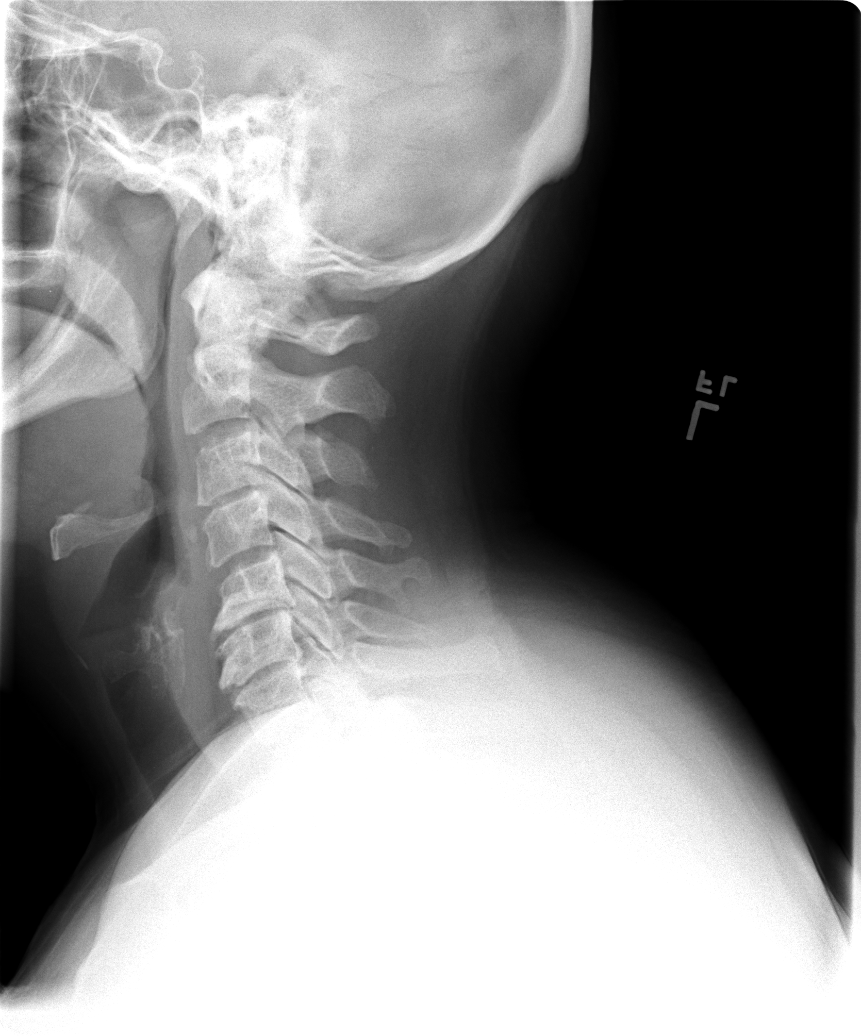

[view not recorded (2 of 6)]
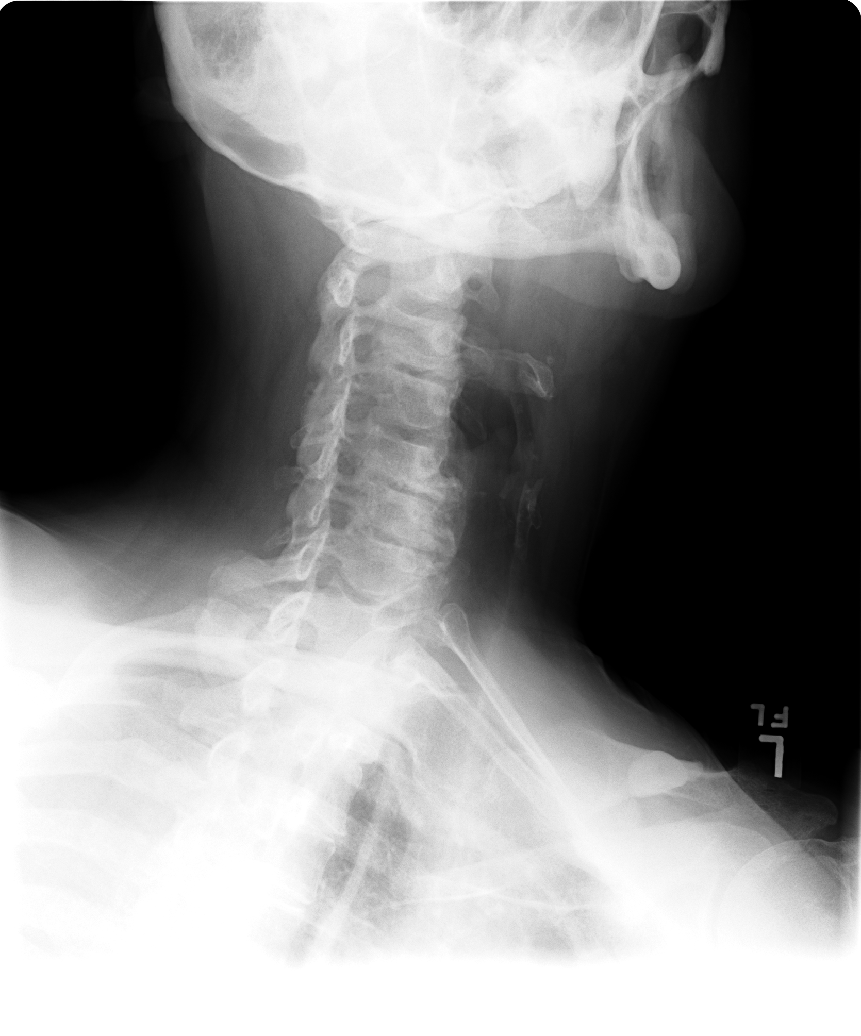

[view not recorded (3 of 6)]
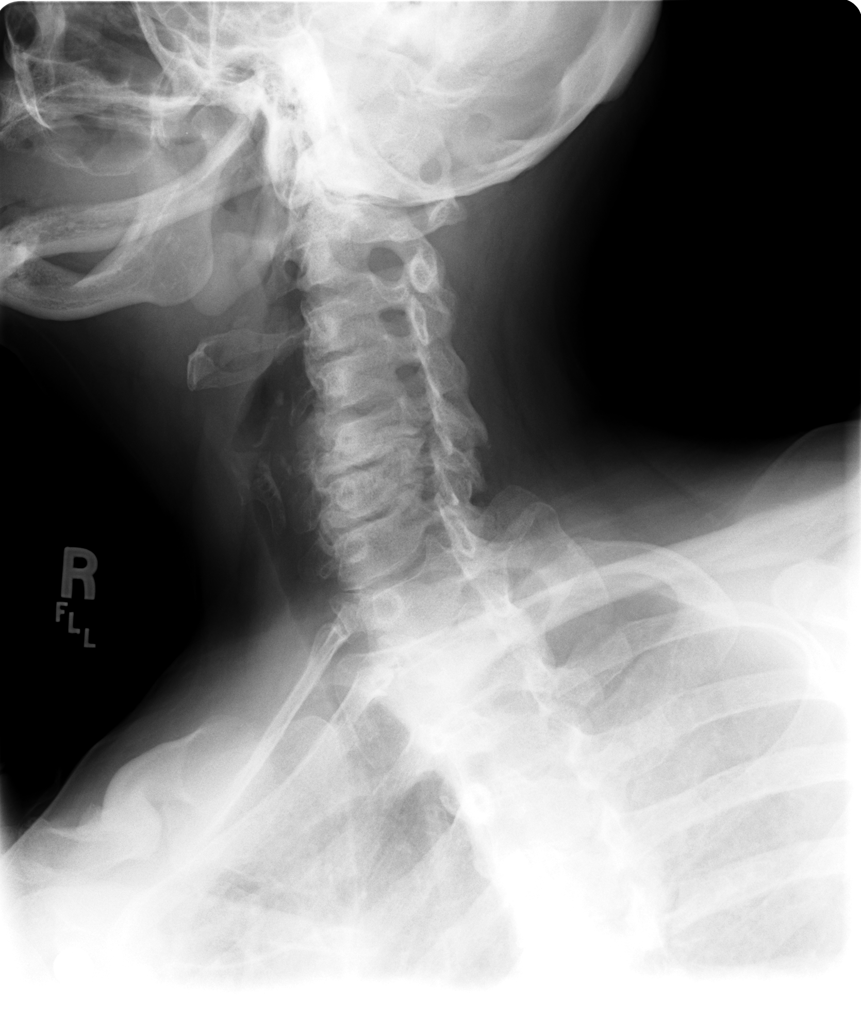

[view not recorded (4 of 6)]
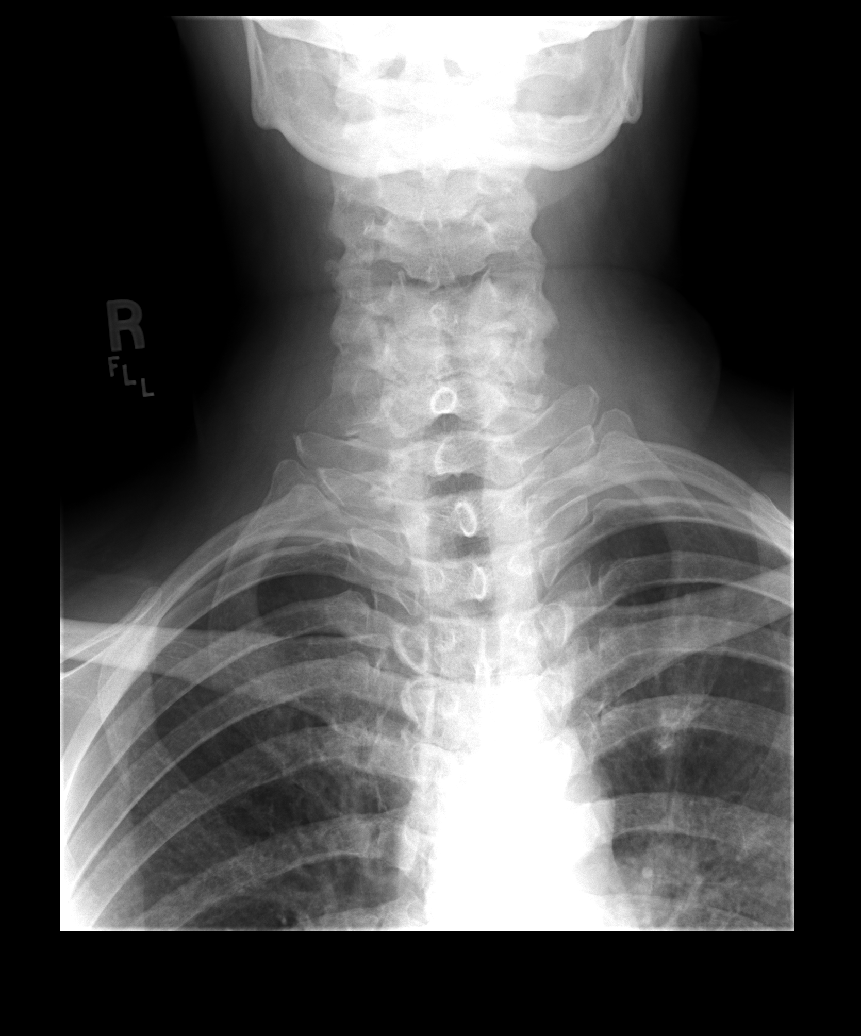

[view not recorded (5 of 6)]
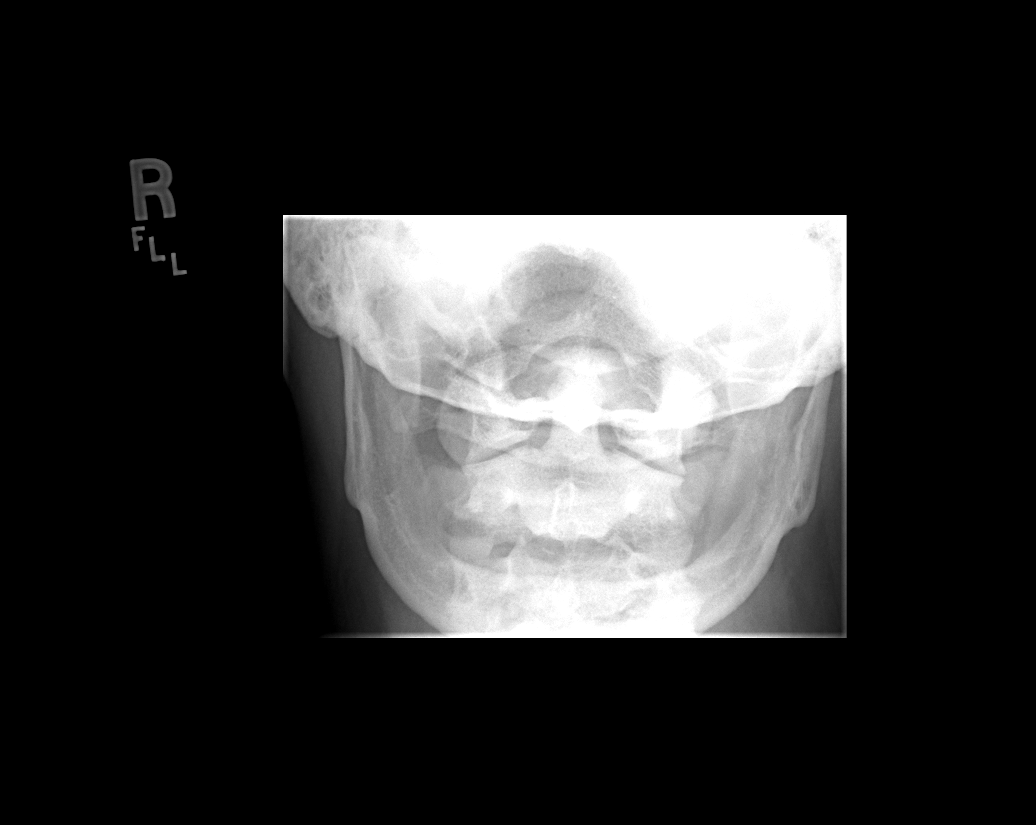

[view not recorded (6 of 6)]
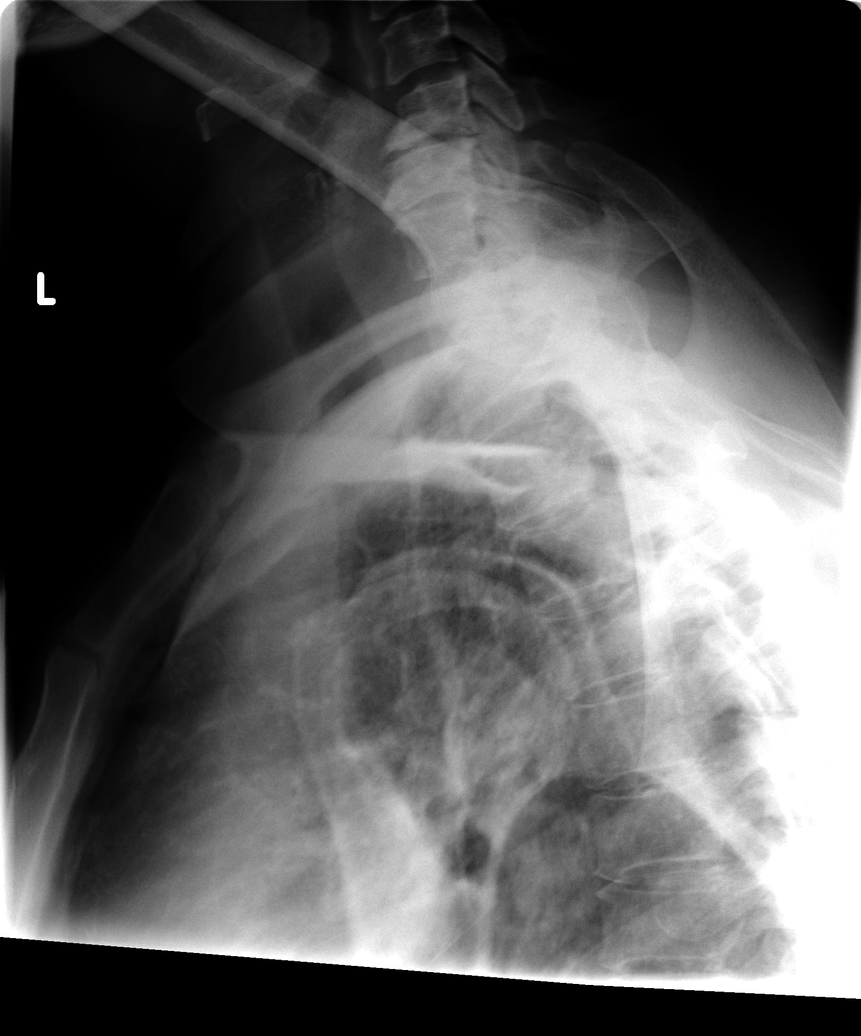

[6 of 6 positions shown; findings below may reference images not displayed]

## 2004-08-31 IMAGING — CR DG PELVIS 1-2V
1 series · 1 of 1 positions shown · non-contrast
Comparison: none

CLINICAL DATA: Status post fall with left shoulder pain.
 LEFT SHOULDER (THREE VIEWS)
 There is significant degenerative changes at the left acromioclavicular joint.  Irregularity within the humeral head at the rotator cuff insertion is felt to represent degenerative changes as well rather than acute bony abnormality.  No evidence of fracture, subluxation or dislocation.
CLINICAL DATA: Fell.  Hip pain.

 PELVIS (ONE VIEW)
 There is no evidence of fracture or diastasis. No other significant bone or soft tissue abnormalities are identified.
 IMPRESSION
 Normal study.

[view not recorded]
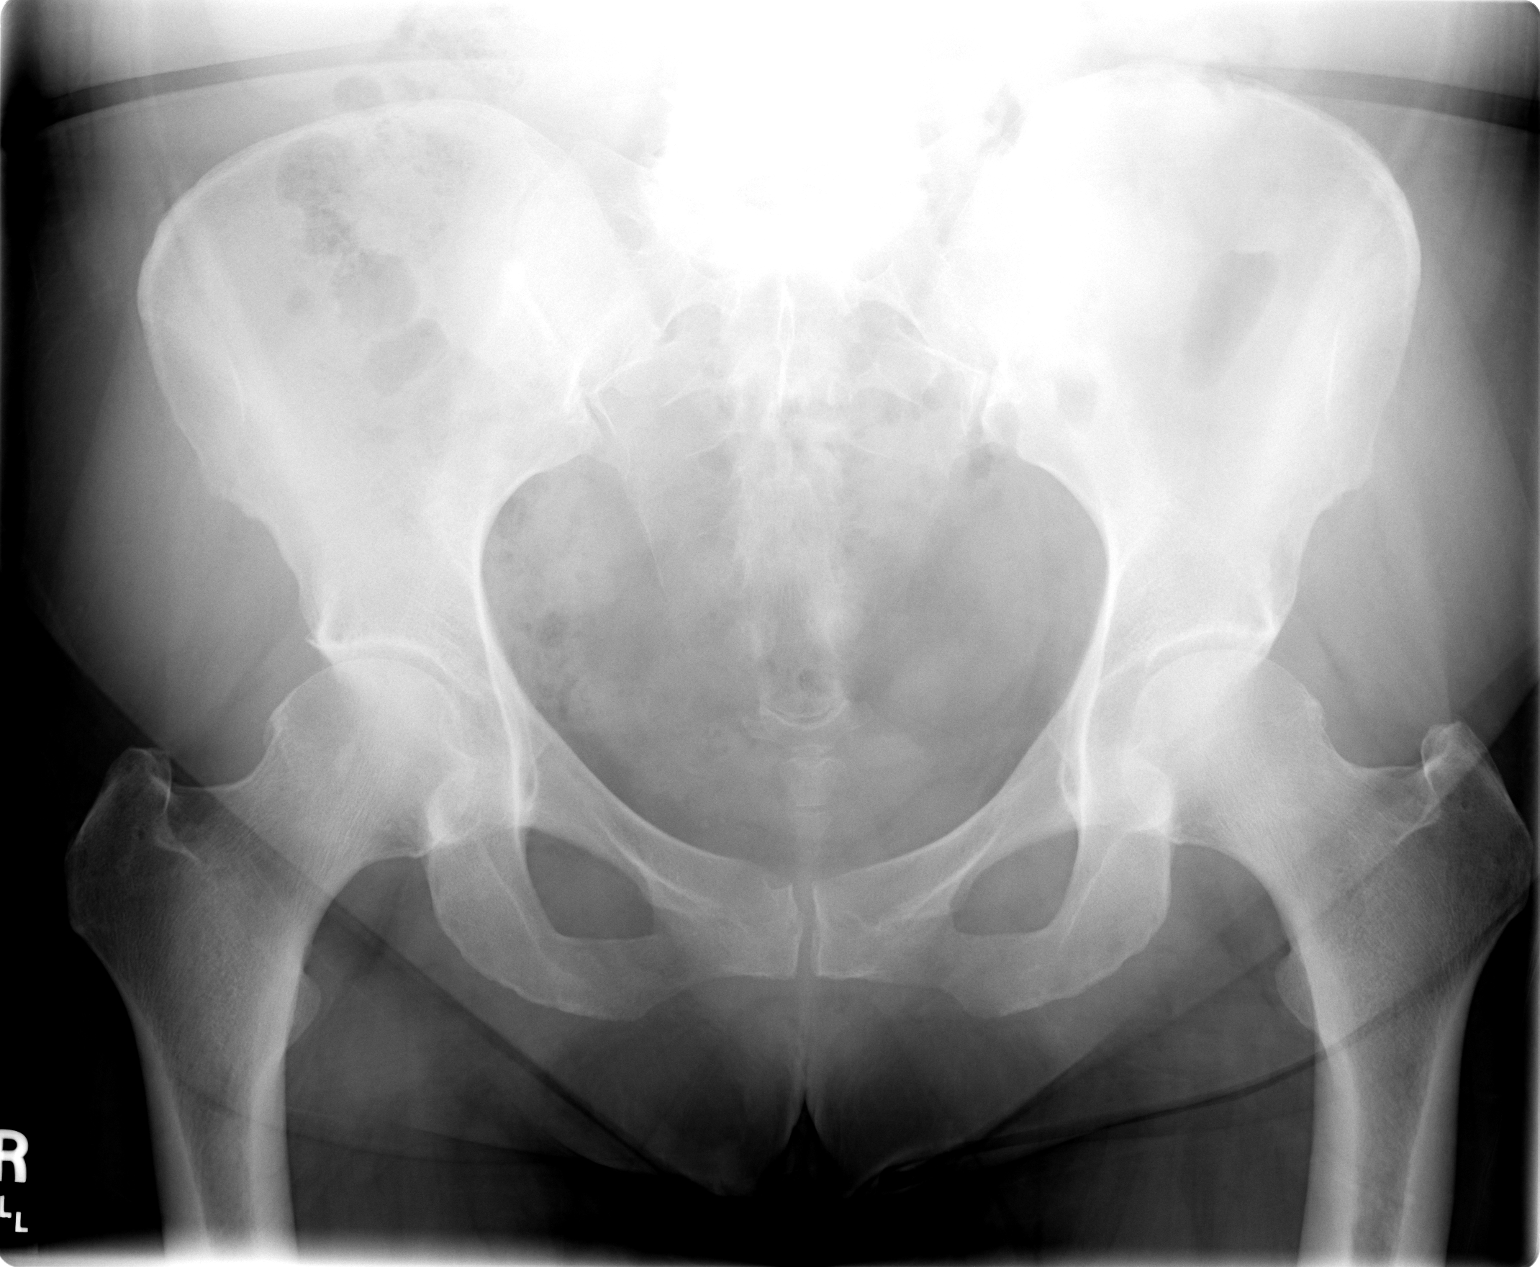

[1 of 1 positions shown; findings below may reference images not displayed]

IMPRESSION: Degenerative changes in the left AC joint.  No acute bony abnormality.

## 2004-09-28 ENCOUNTER — Ambulatory Visit (HOSPITAL_COMMUNITY): Admission: RE | Admit: 2004-09-28 | Discharge: 2004-09-28 | Payer: Self-pay | Admitting: Internal Medicine

## 2006-06-04 ENCOUNTER — Emergency Department (HOSPITAL_COMMUNITY): Admission: EM | Admit: 2006-06-04 | Discharge: 2006-06-04 | Payer: Self-pay | Admitting: Emergency Medicine

## 2006-07-31 ENCOUNTER — Emergency Department (HOSPITAL_COMMUNITY): Admission: EM | Admit: 2006-07-31 | Discharge: 2006-07-31 | Payer: Self-pay | Admitting: Emergency Medicine

## 2006-08-08 ENCOUNTER — Emergency Department (HOSPITAL_COMMUNITY): Admission: EM | Admit: 2006-08-08 | Discharge: 2006-08-08 | Payer: Self-pay | Admitting: Emergency Medicine

## 2006-08-27 ENCOUNTER — Emergency Department (HOSPITAL_COMMUNITY): Admission: EM | Admit: 2006-08-27 | Discharge: 2006-08-27 | Payer: Self-pay | Admitting: Emergency Medicine

## 2006-11-28 ENCOUNTER — Emergency Department (HOSPITAL_COMMUNITY): Admission: EM | Admit: 2006-11-28 | Discharge: 2006-11-28 | Payer: Self-pay | Admitting: Emergency Medicine

## 2006-12-22 ENCOUNTER — Emergency Department (HOSPITAL_COMMUNITY): Admission: EM | Admit: 2006-12-22 | Discharge: 2006-12-22 | Payer: Self-pay | Admitting: Emergency Medicine

## 2007-01-26 ENCOUNTER — Emergency Department (HOSPITAL_COMMUNITY): Admission: EM | Admit: 2007-01-26 | Discharge: 2007-01-26 | Payer: Self-pay | Admitting: Emergency Medicine

## 2007-01-26 IMAGING — CR DG CHEST 1V PORT
1 series · 1 of 1 positions shown · non-contrast
Comparison: none

HISTORY: Asthma, dyspnea

PORTABLE CHEST ONE VIEW:
Portable exam [P5] hours compared to [DATE]
Normal heart size, mediastinal contours, and pulmonary vascularity.
Minimal chronic peribronchial thickening.
Minimal subsegmental atelectasis left base.
Lungs otherwise clear.
Bones unremarkable.

[view not recorded]
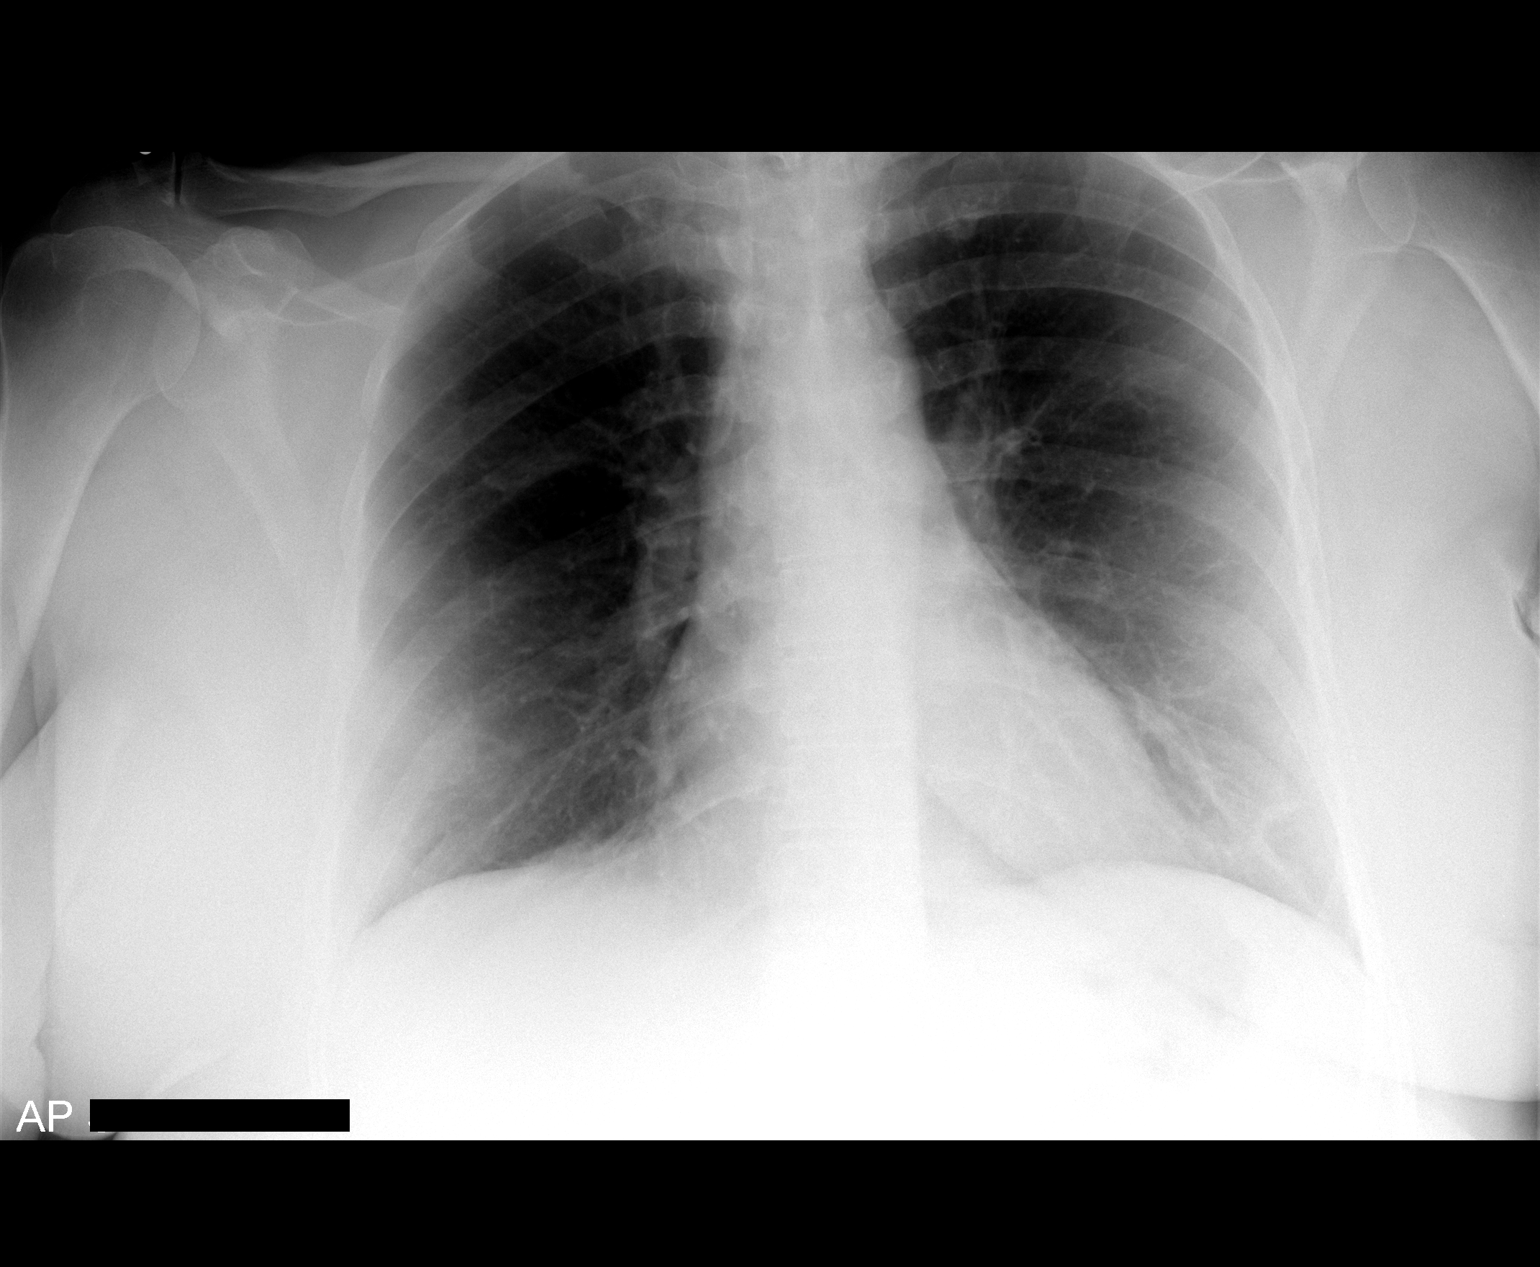

[1 of 1 positions shown; findings below may reference images not displayed]

IMPRESSION: Minimal bronchitic changes.
Minimal subsegmental atelectasis left base.

## 2007-02-01 ENCOUNTER — Emergency Department (HOSPITAL_COMMUNITY): Admission: EM | Admit: 2007-02-01 | Discharge: 2007-02-01 | Payer: Self-pay | Admitting: Emergency Medicine

## 2007-02-18 ENCOUNTER — Emergency Department (HOSPITAL_COMMUNITY): Admission: EM | Admit: 2007-02-18 | Discharge: 2007-02-18 | Payer: Self-pay | Admitting: Emergency Medicine

## 2007-02-18 IMAGING — CR DG WRIST COMPLETE 3+V*L*
4 series · 4 of 4 positions shown · non-contrast
Comparison: none

CLINICAL DATA: Left wrist pain, trauma.  
 LEFT WRIST - 4 VIEW:

[view not recorded (1 of 4)]
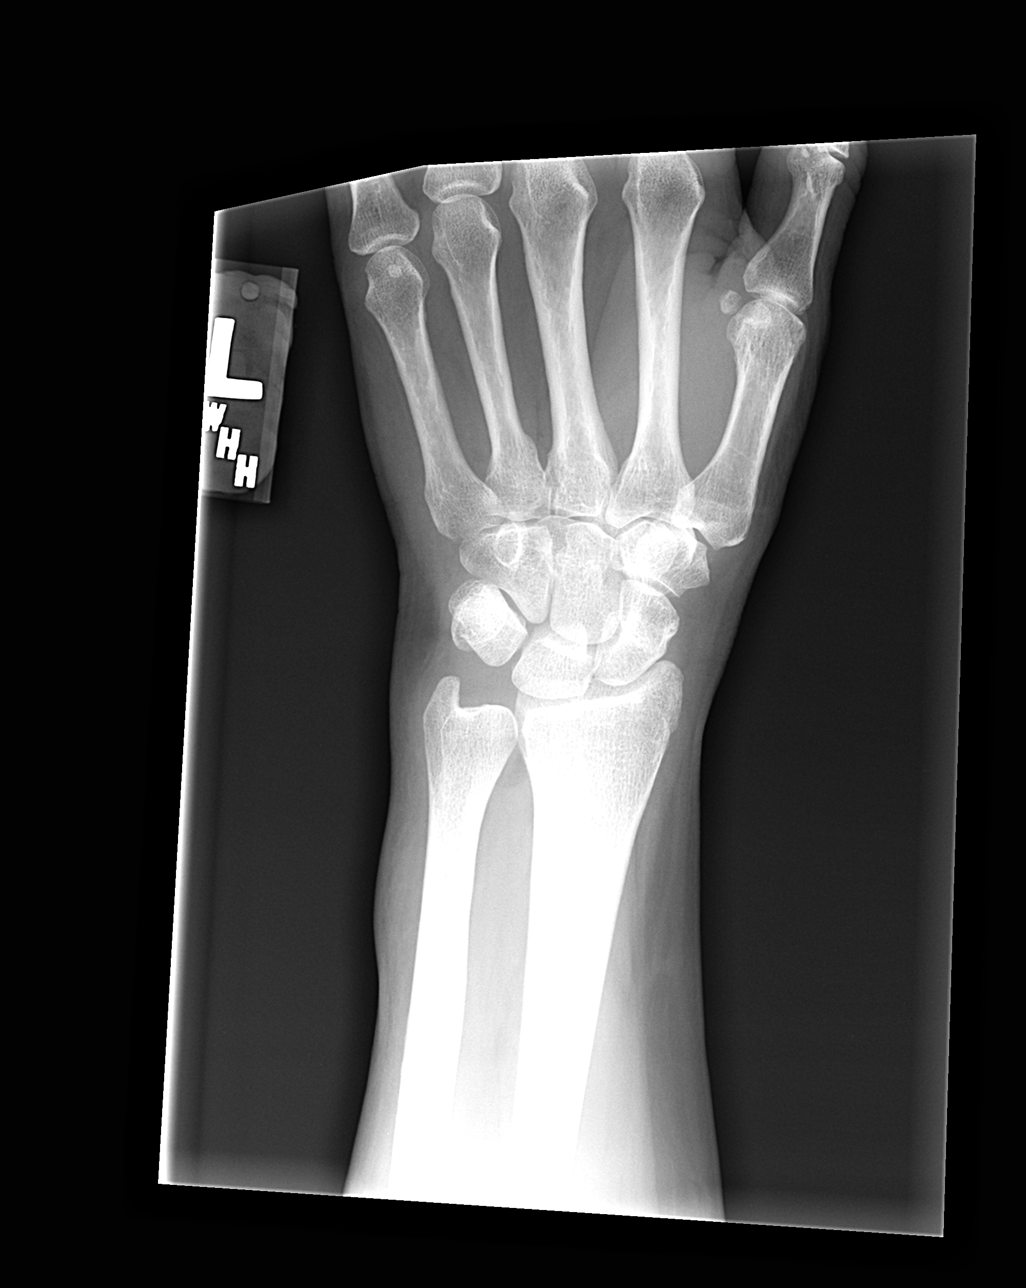

[view not recorded (2 of 4)]
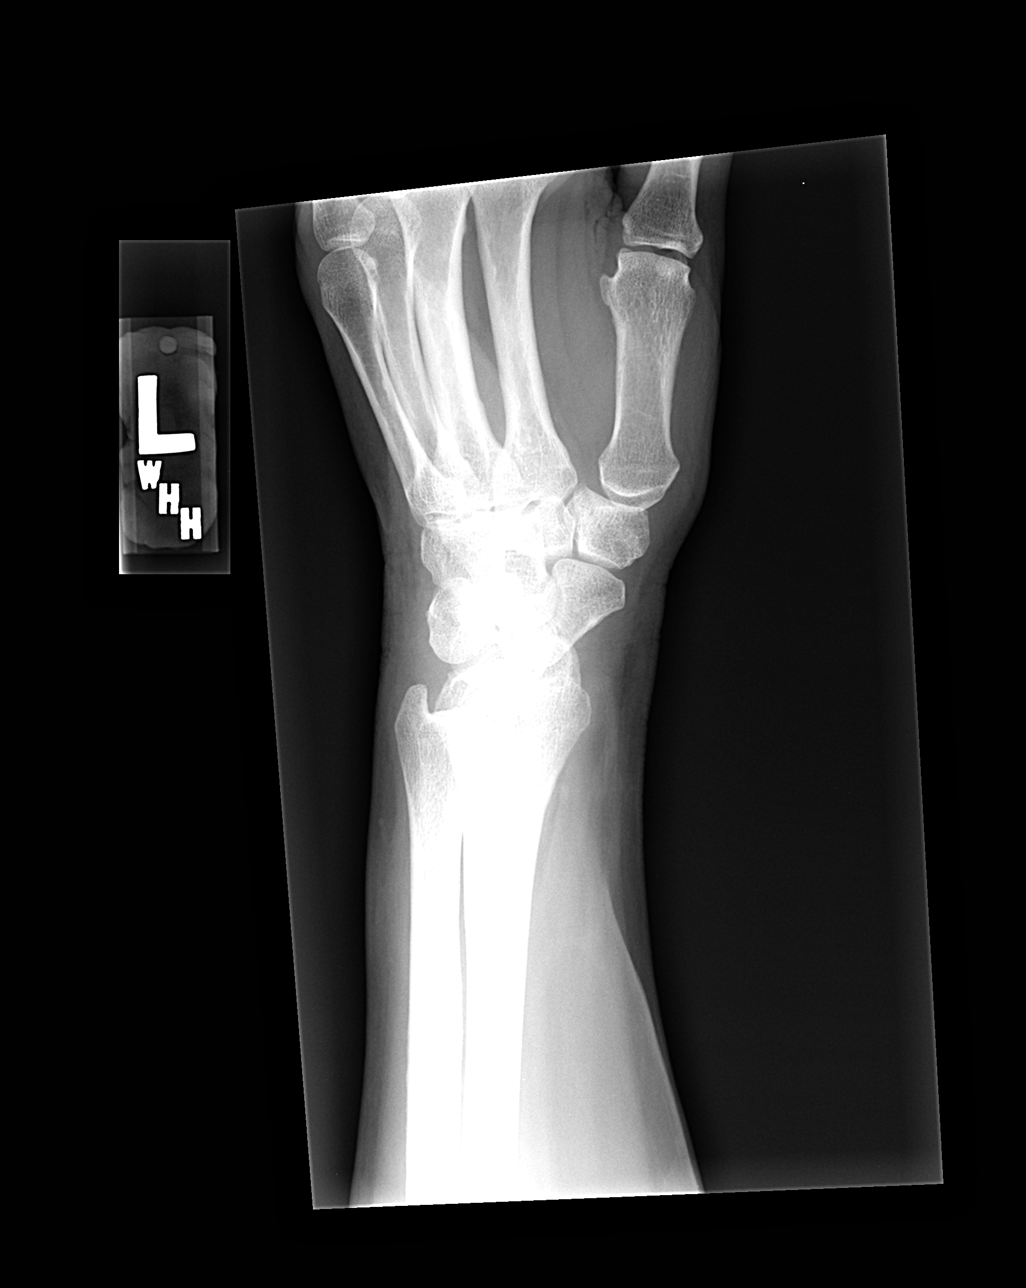

[view not recorded (3 of 4)]
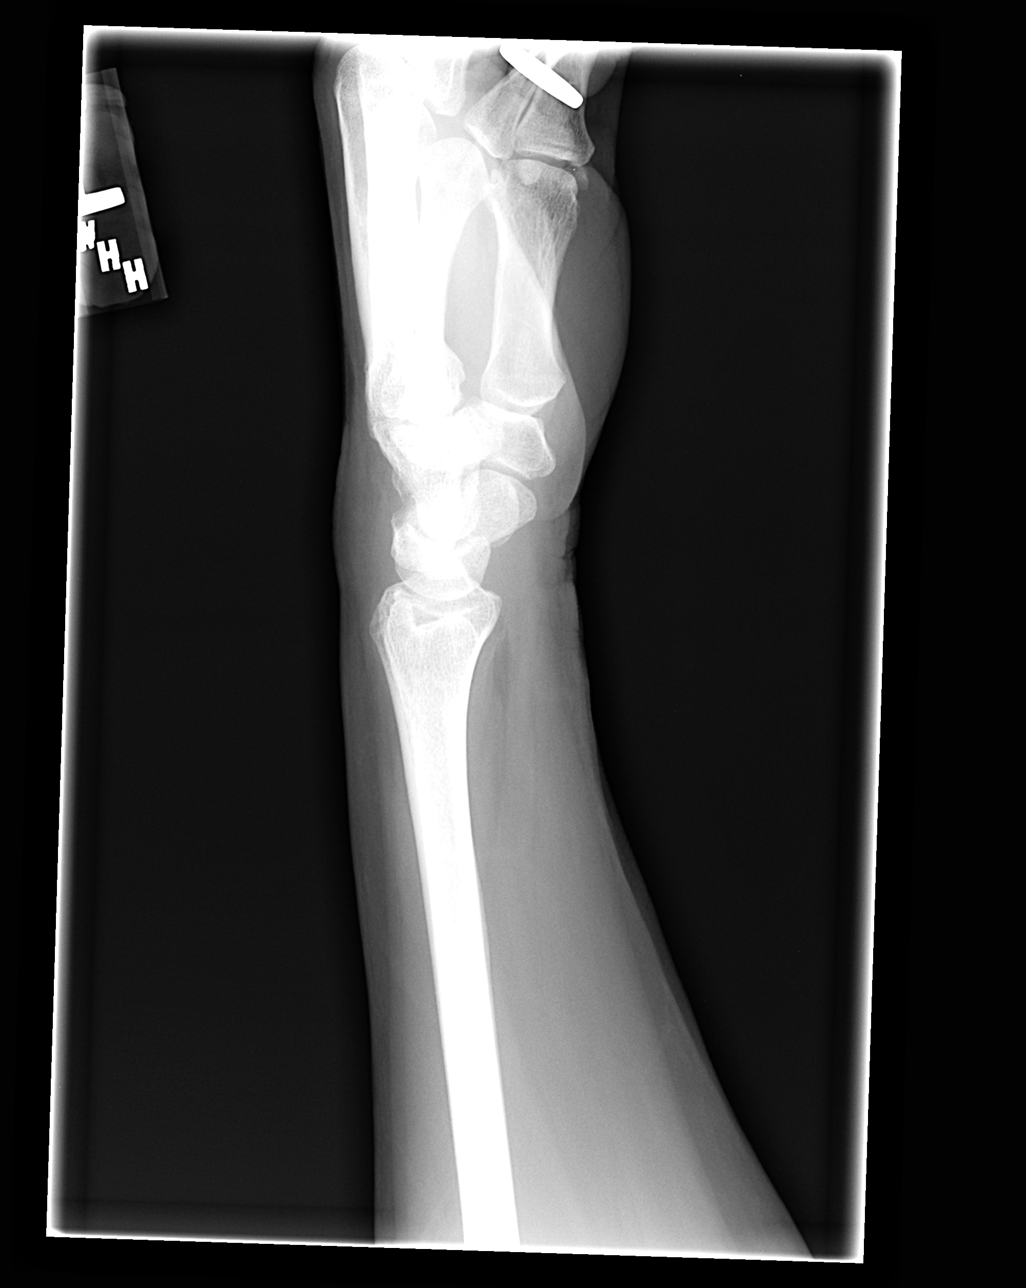

[view not recorded (4 of 4)]
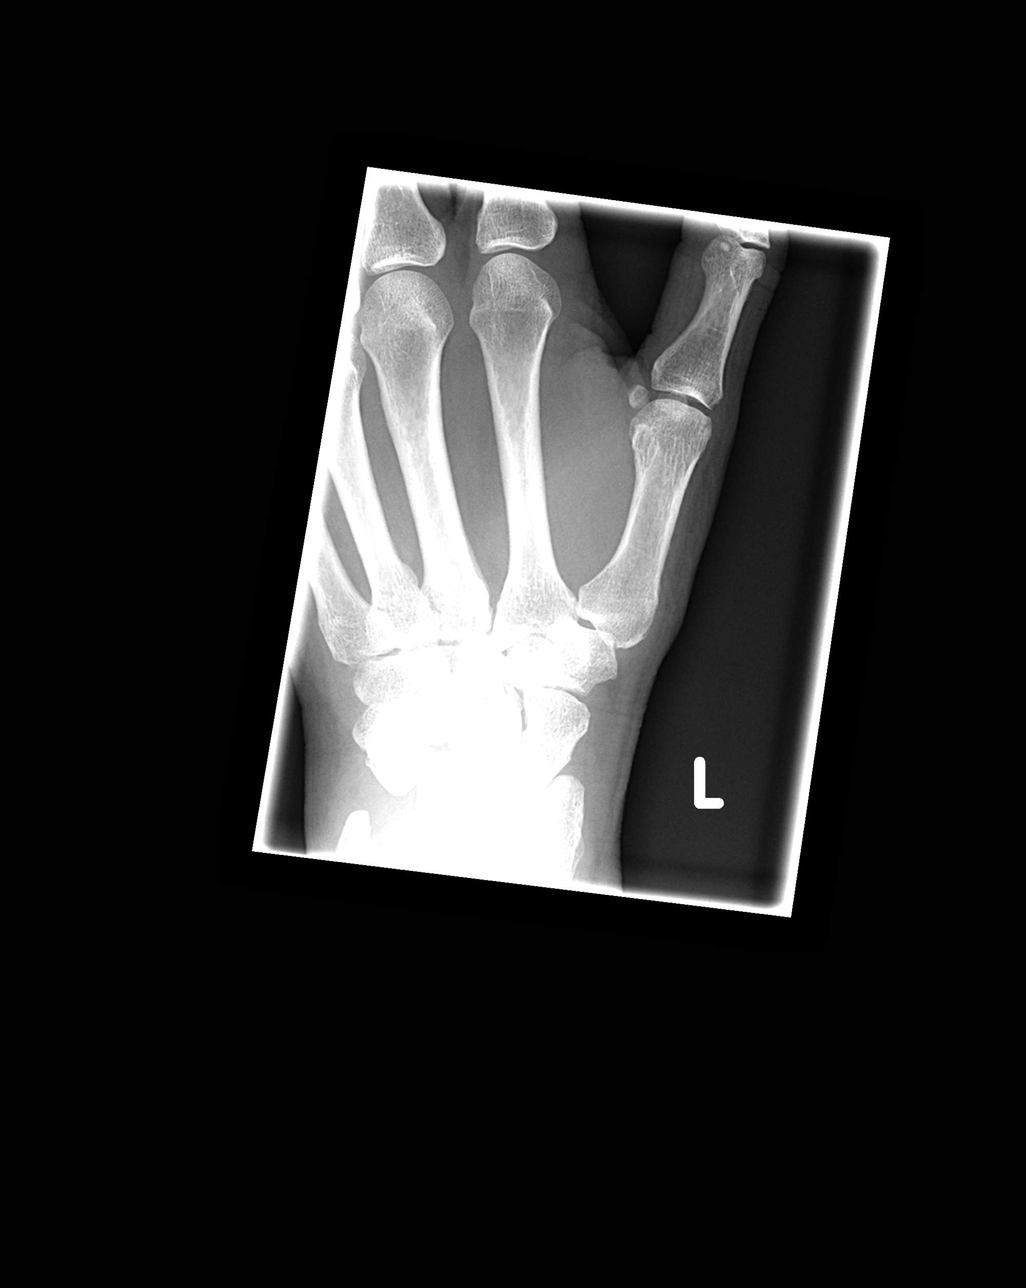

[4 of 4 positions shown; findings below may reference images not displayed]

FINDINGS: There is no evidence of fracture or dislocation.  There is no evidence of arthropathy or other focal bone abnormality.  Soft tissues are unremarkable.
IMPRESSION: Negative.

## 2007-02-21 ENCOUNTER — Emergency Department (HOSPITAL_COMMUNITY): Admission: EM | Admit: 2007-02-21 | Discharge: 2007-02-21 | Payer: Self-pay | Admitting: Emergency Medicine

## 2007-02-24 ENCOUNTER — Emergency Department (HOSPITAL_COMMUNITY): Admission: EM | Admit: 2007-02-24 | Discharge: 2007-02-24 | Payer: Self-pay | Admitting: Emergency Medicine

## 2007-02-27 ENCOUNTER — Ambulatory Visit (HOSPITAL_COMMUNITY): Payer: Self-pay | Admitting: Emergency Medicine

## 2007-02-27 ENCOUNTER — Encounter (HOSPITAL_COMMUNITY): Admission: RE | Admit: 2007-02-27 | Discharge: 2007-03-29 | Payer: Self-pay | Admitting: Emergency Medicine

## 2007-03-02 ENCOUNTER — Emergency Department (HOSPITAL_COMMUNITY): Admission: EM | Admit: 2007-03-02 | Discharge: 2007-03-02 | Payer: Self-pay | Admitting: Emergency Medicine

## 2007-04-08 ENCOUNTER — Emergency Department (HOSPITAL_COMMUNITY): Admission: EM | Admit: 2007-04-08 | Discharge: 2007-04-08 | Payer: Self-pay | Admitting: Emergency Medicine

## 2007-04-29 ENCOUNTER — Emergency Department (HOSPITAL_COMMUNITY): Admission: EM | Admit: 2007-04-29 | Discharge: 2007-04-30 | Payer: Self-pay | Admitting: Emergency Medicine

## 2007-05-13 ENCOUNTER — Emergency Department (HOSPITAL_COMMUNITY): Admission: EM | Admit: 2007-05-13 | Discharge: 2007-05-13 | Payer: Self-pay | Admitting: Emergency Medicine

## 2007-05-28 ENCOUNTER — Emergency Department (HOSPITAL_COMMUNITY): Admission: EM | Admit: 2007-05-28 | Discharge: 2007-05-28 | Payer: Self-pay | Admitting: Emergency Medicine

## 2007-06-30 ENCOUNTER — Emergency Department (HOSPITAL_COMMUNITY): Admission: EM | Admit: 2007-06-30 | Discharge: 2007-06-30 | Payer: Self-pay | Admitting: Emergency Medicine

## 2007-08-25 ENCOUNTER — Emergency Department (HOSPITAL_COMMUNITY): Admission: EM | Admit: 2007-08-25 | Discharge: 2007-08-25 | Payer: Self-pay | Admitting: Emergency Medicine

## 2007-08-25 IMAGING — CR DG CHEST 1V PORT
1 series · 1 of 1 positions shown · non-contrast
Comparison: [DATE].

CLINICAL DATA: Short of breath.
 PORTABLE CHEST ? 1 VIEW ? [DATE] ? [QQ] HOURS:

[view not recorded]
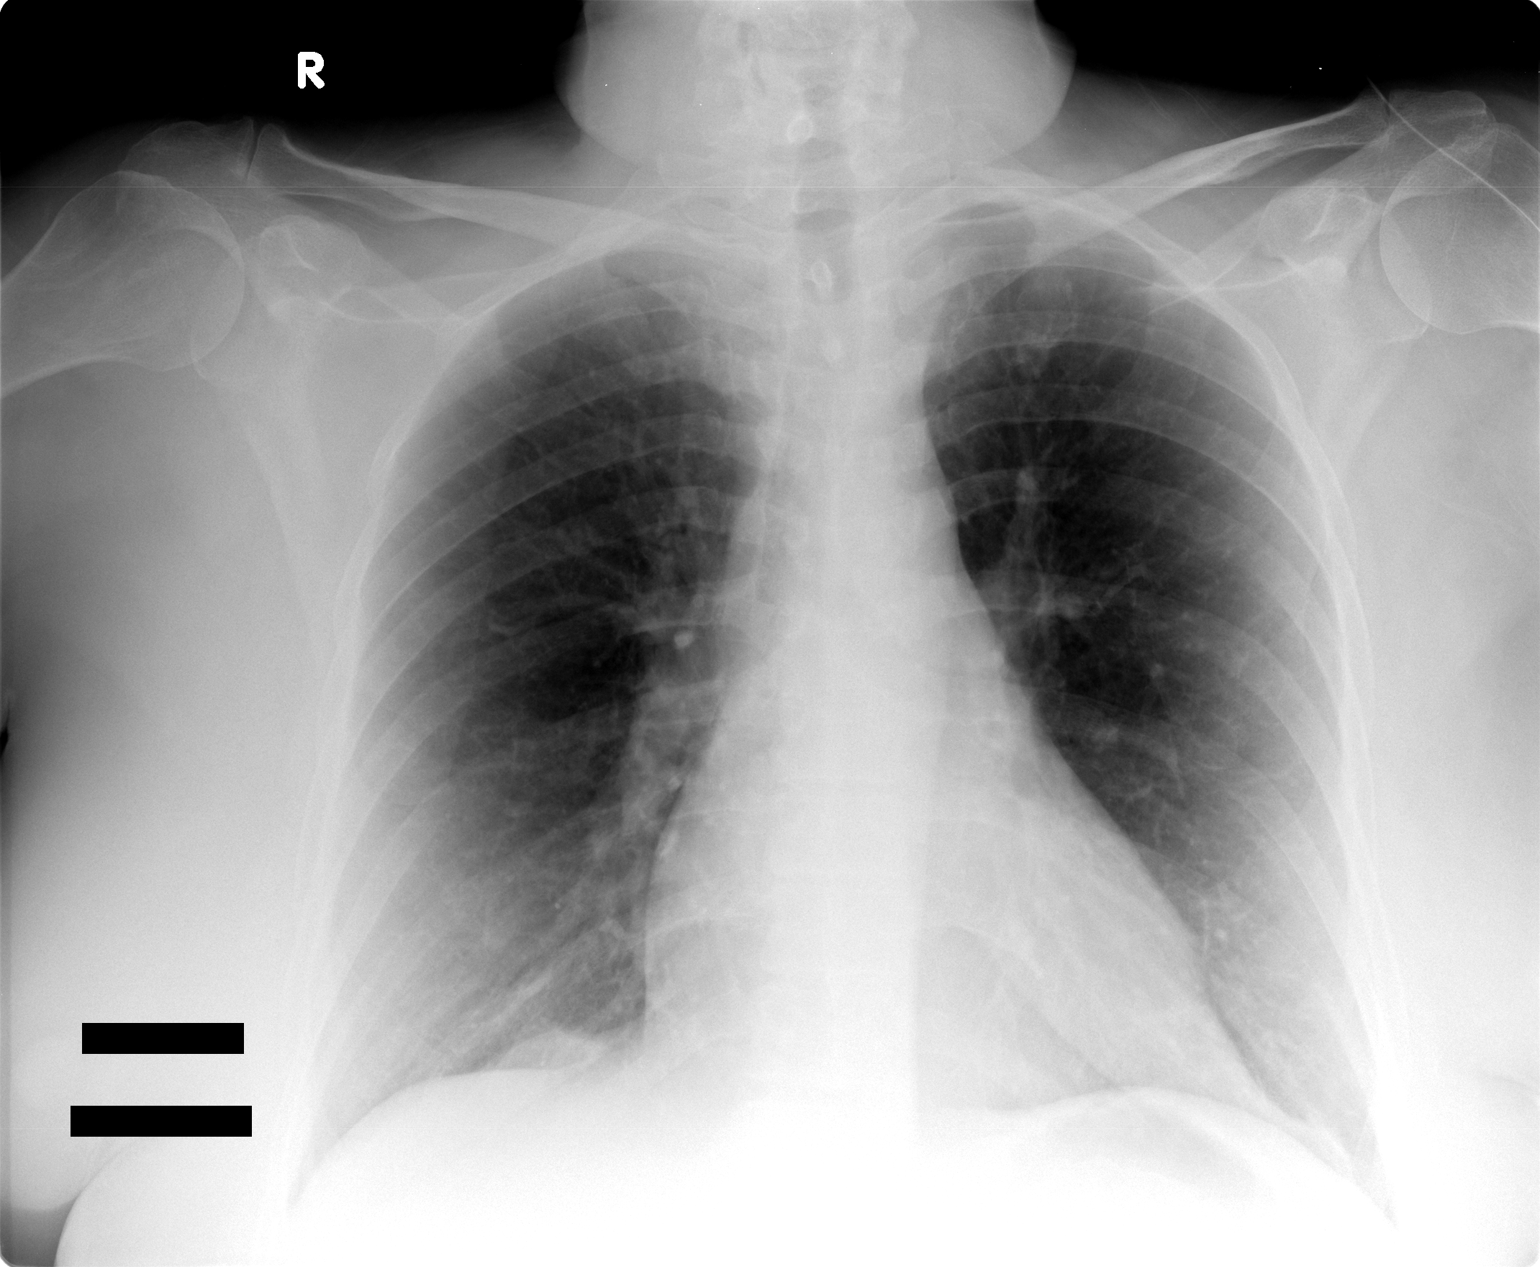

[1 of 1 positions shown; findings below may reference images not displayed]

FINDINGS: The lungs are clear.  The heart is within normal limits in size.  No bony abnormality is seen.
IMPRESSION: No active lung disease.

## 2007-09-16 ENCOUNTER — Emergency Department (HOSPITAL_COMMUNITY): Admission: EM | Admit: 2007-09-16 | Discharge: 2007-09-16 | Payer: Self-pay | Admitting: Hematology and Oncology

## 2008-05-23 ENCOUNTER — Emergency Department (HOSPITAL_COMMUNITY): Admission: EM | Admit: 2008-05-23 | Discharge: 2008-05-23 | Payer: Self-pay | Admitting: Emergency Medicine

## 2008-08-24 ENCOUNTER — Emergency Department (HOSPITAL_COMMUNITY): Admission: EM | Admit: 2008-08-24 | Discharge: 2008-08-24 | Payer: Self-pay | Admitting: Emergency Medicine

## 2008-08-24 IMAGING — CR DG CHEST 2V
2 series · 2 of 2 positions shown · non-contrast
Comparison: [DATE]

CLINICAL DATA: Shortness of breath

CHEST - 2 VIEW

[view not recorded (1 of 2)]
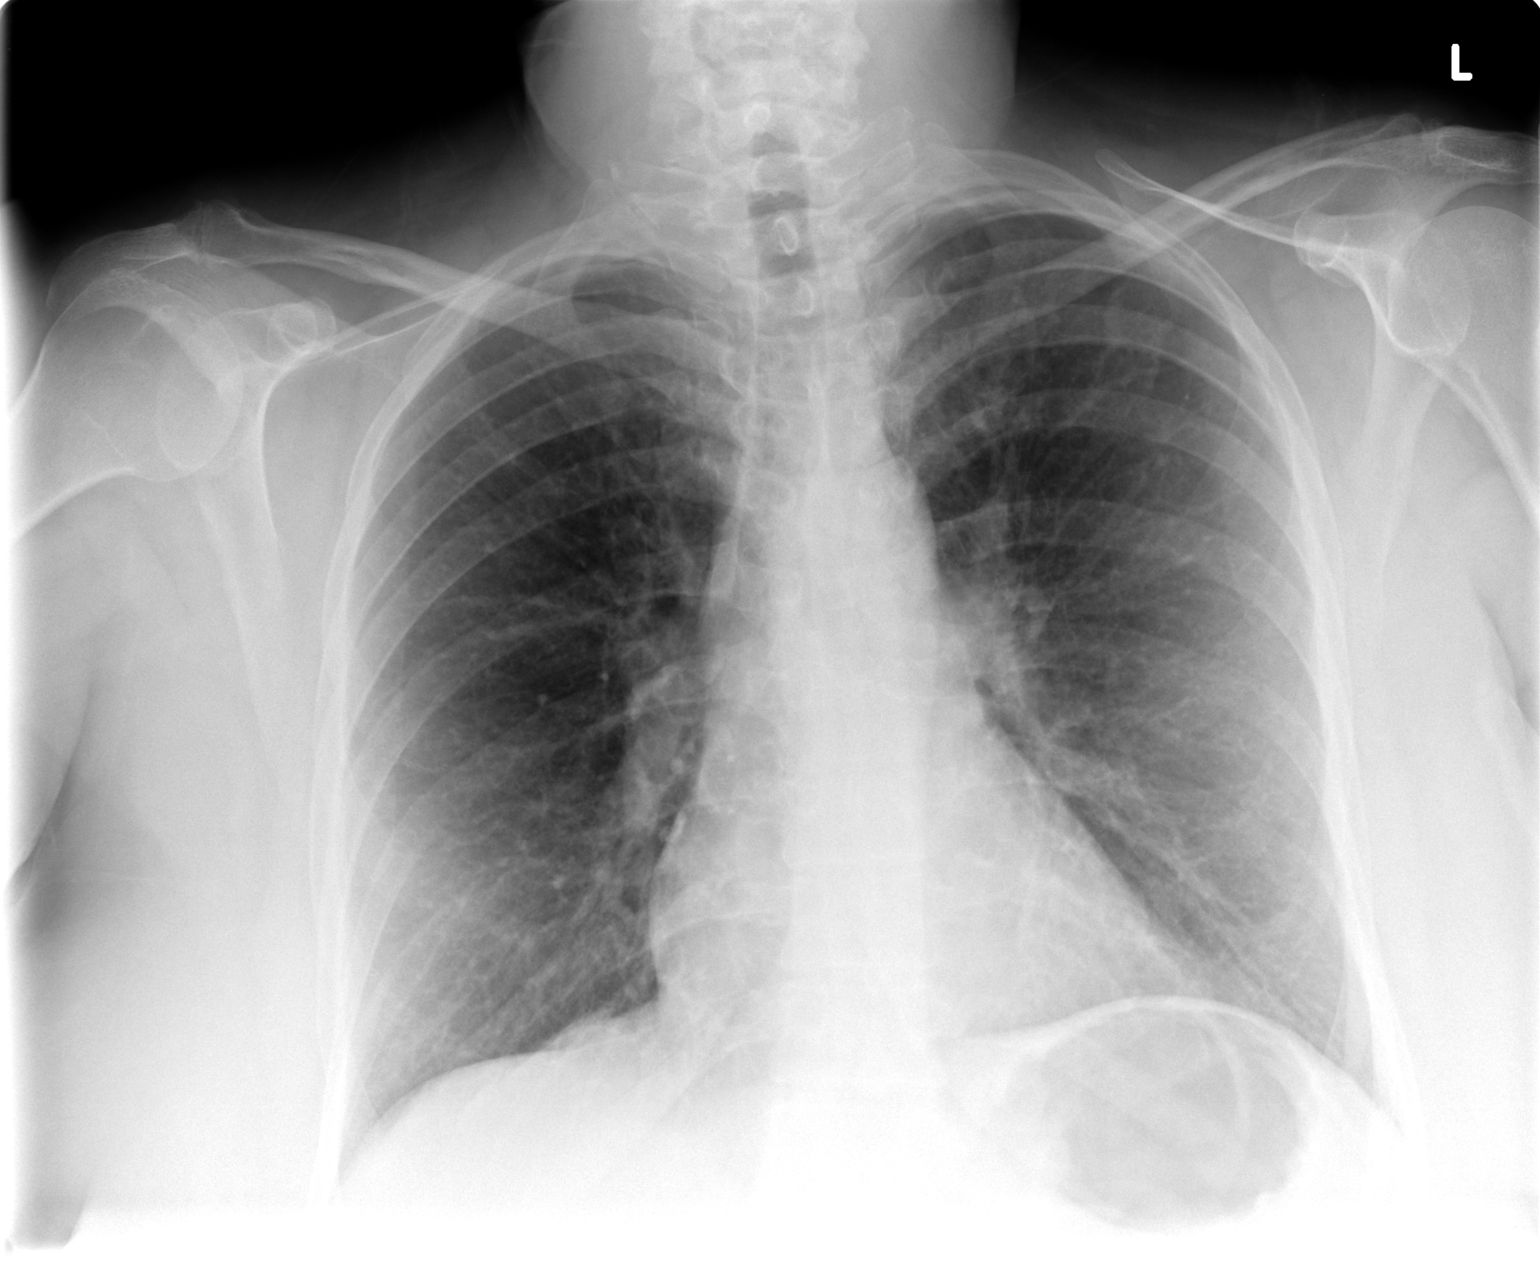

[view not recorded (2 of 2)]
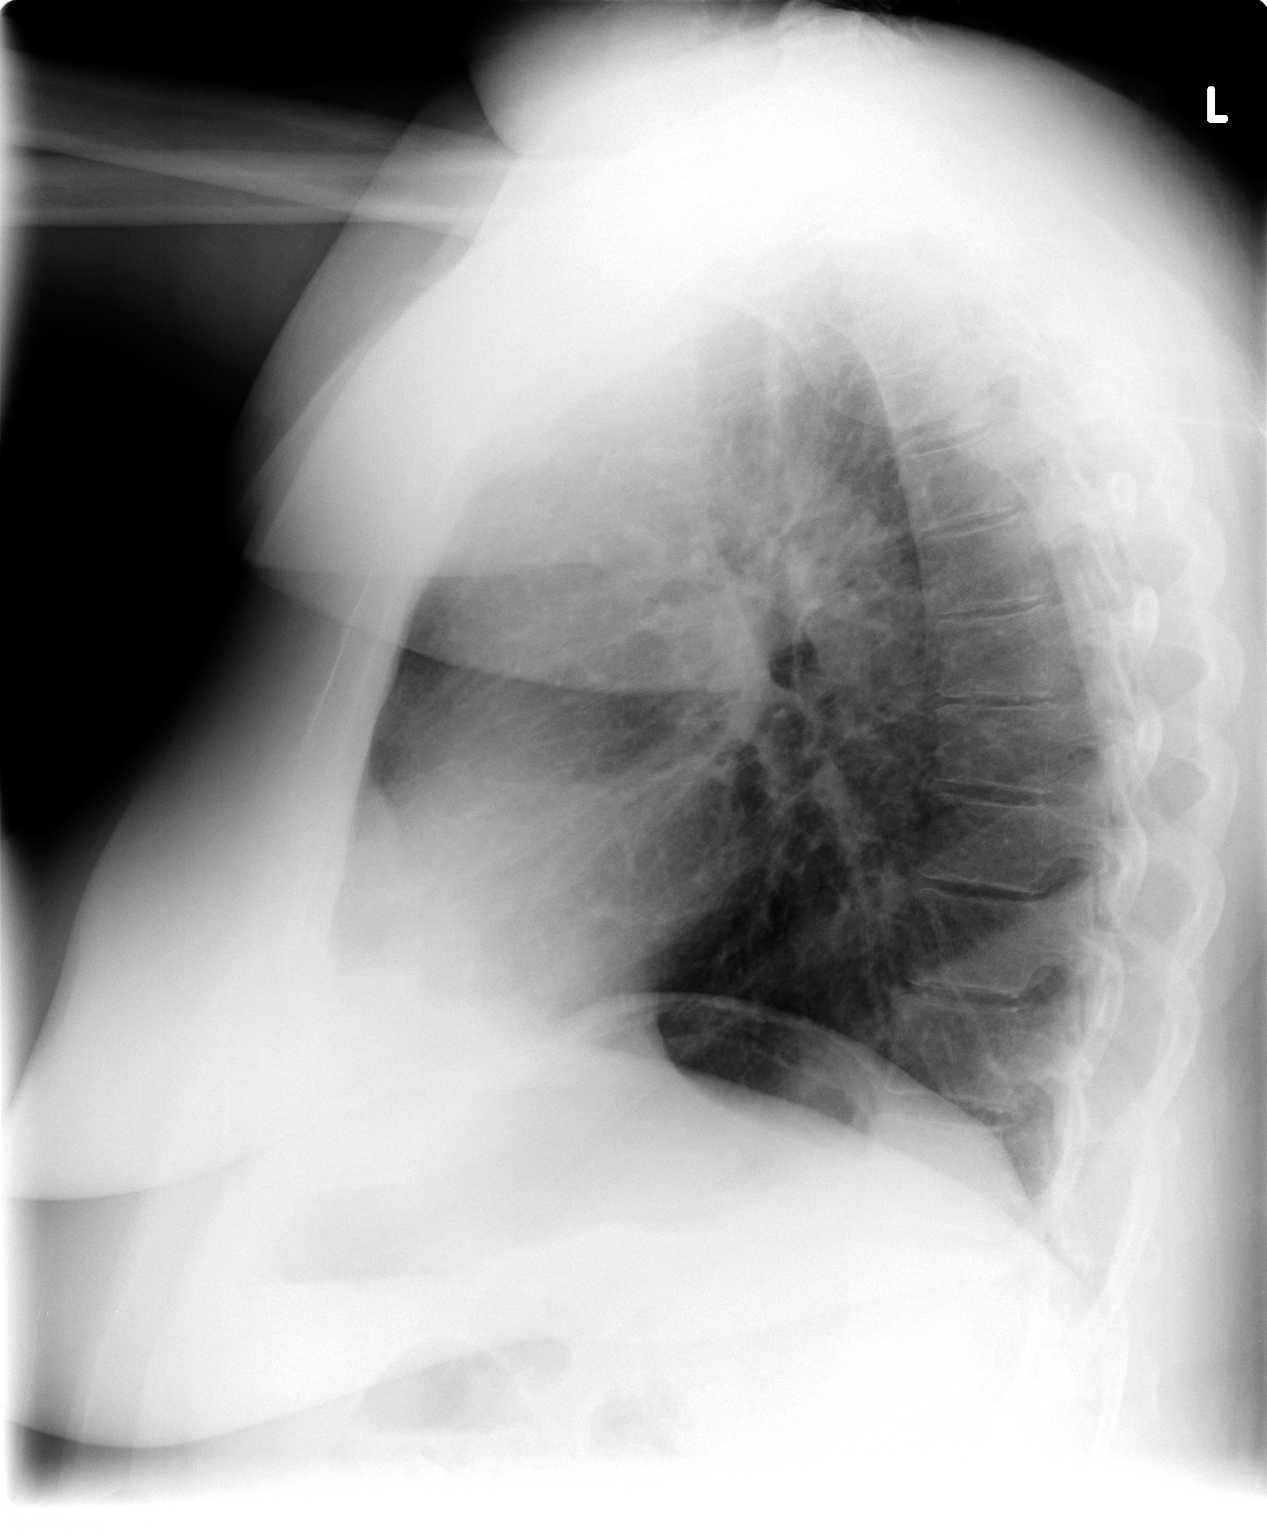

[2 of 2 positions shown; findings below may reference images not displayed]

FINDINGS: Cardiomediastinal silhouette is stable.  No acute
infiltrate or pleural effusion.  No pulmonary edema.  Bony thorax
is unremarkable.
IMPRESSION: Stable chest.  No active disease.

## 2009-01-11 ENCOUNTER — Emergency Department (HOSPITAL_COMMUNITY): Admission: EM | Admit: 2009-01-11 | Discharge: 2009-01-11 | Payer: Self-pay | Admitting: Emergency Medicine

## 2009-01-11 IMAGING — CR DG CHEST 2V
2 series · 2 of 2 positions shown · non-contrast
Comparison: [DATE]

CLINICAL DATA: Shortness of breath.  Cough.

CHEST - 2 VIEW

[view not recorded (1 of 2)]
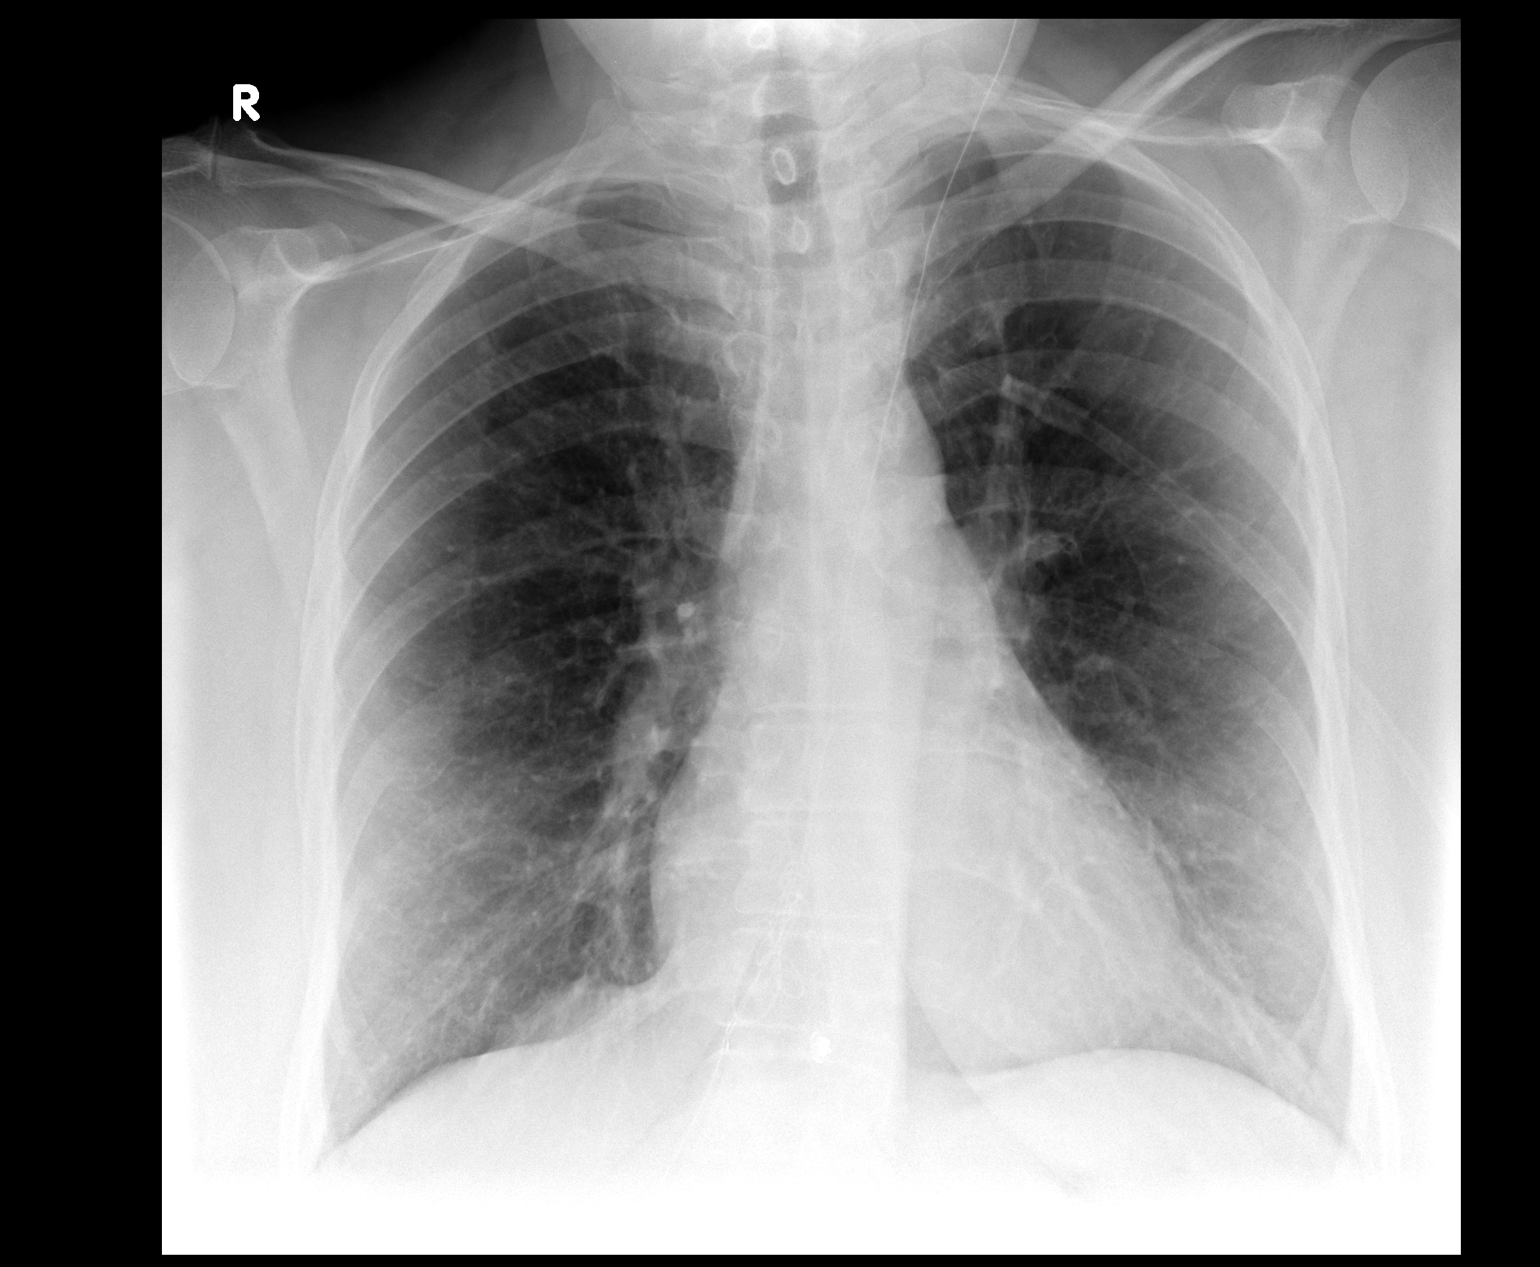

[view not recorded (2 of 2)]
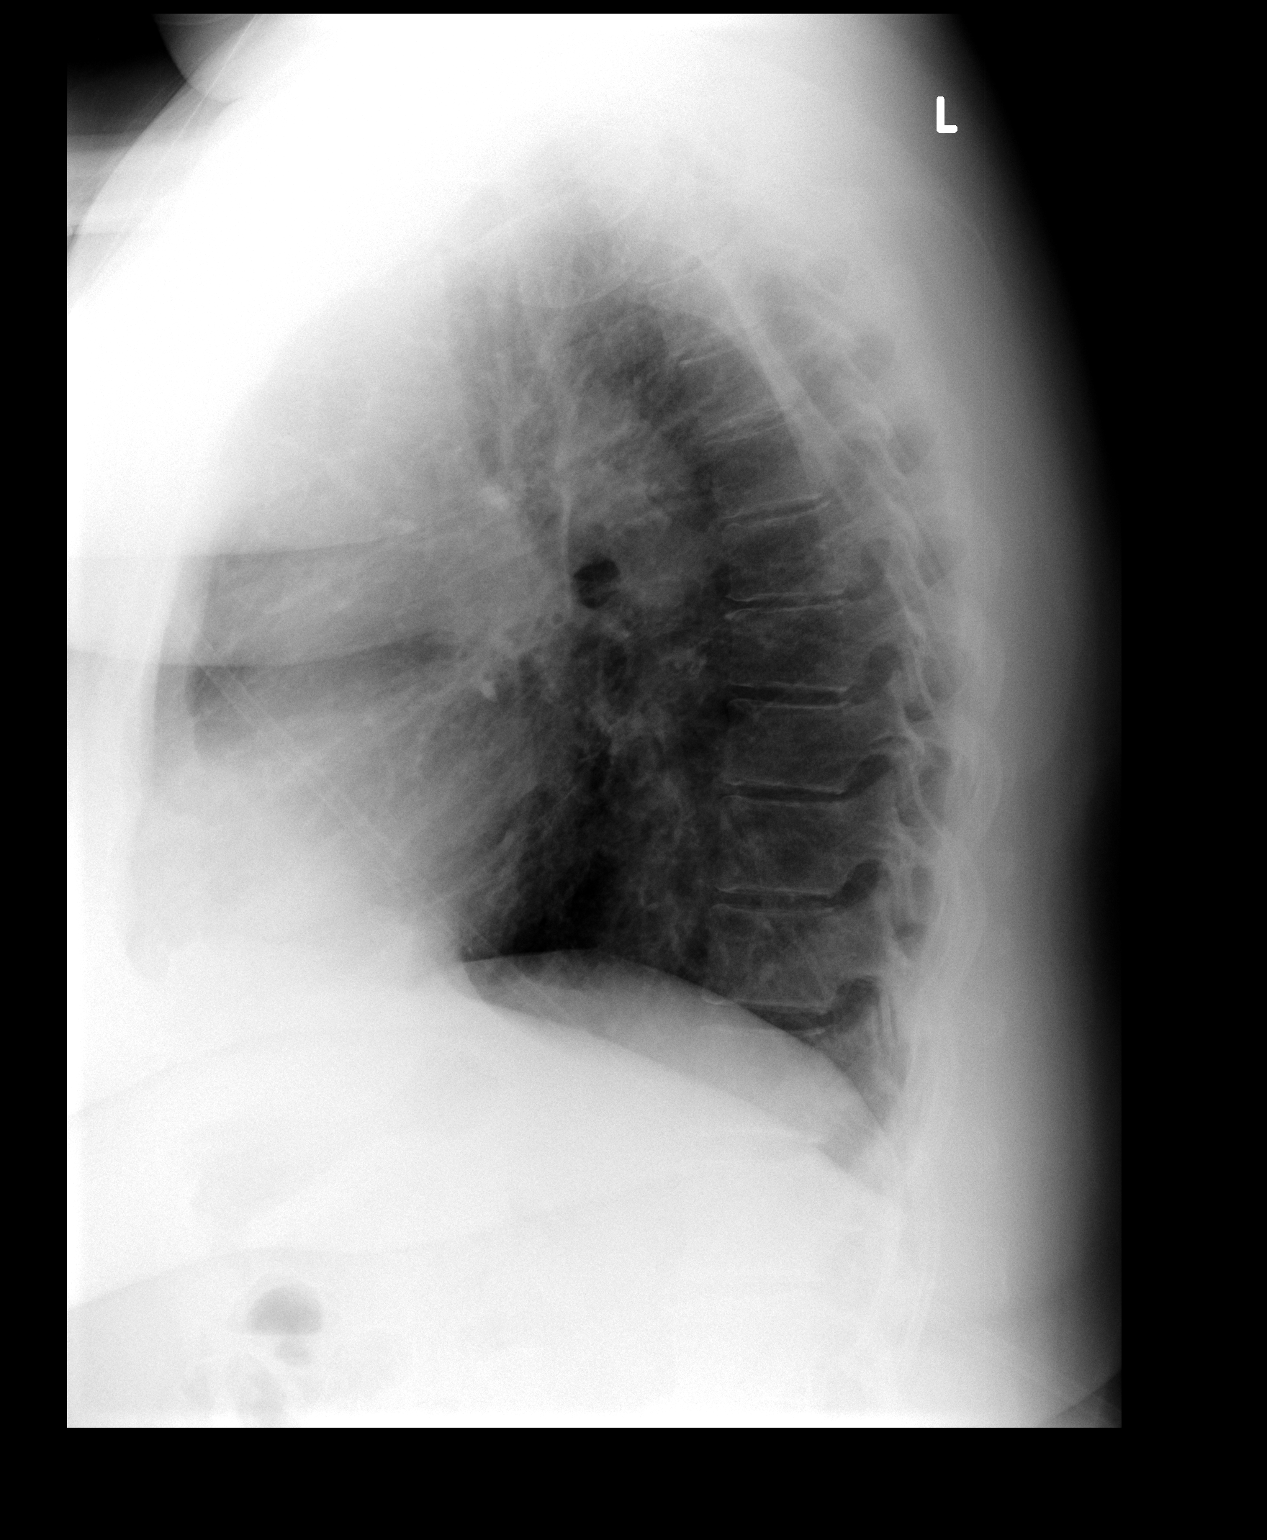

[2 of 2 positions shown; findings below may reference images not displayed]

FINDINGS: The heart size and mediastinal contours are within normal
limits.  Both lungs are clear.  The visualized skeletal structures
are unremarkable.
IMPRESSION: No active cardiopulmonary disease.

## 2009-04-29 ENCOUNTER — Ambulatory Visit: Payer: Self-pay | Admitting: Internal Medicine

## 2009-04-29 ENCOUNTER — Encounter: Admission: RE | Admit: 2009-04-29 | Discharge: 2009-04-29 | Payer: Self-pay | Admitting: Otolaryngology

## 2009-04-29 DIAGNOSIS — J4489 Other specified chronic obstructive pulmonary disease: Secondary | ICD-10-CM | POA: Insufficient documentation

## 2009-04-29 DIAGNOSIS — I10 Essential (primary) hypertension: Secondary | ICD-10-CM

## 2009-04-29 DIAGNOSIS — J449 Chronic obstructive pulmonary disease, unspecified: Secondary | ICD-10-CM

## 2009-04-29 DIAGNOSIS — R635 Abnormal weight gain: Secondary | ICD-10-CM

## 2009-04-29 IMAGING — CT CT ORBIT/TEMPORAL/IAC W/O CM
4 of 5 series · 17 of 30 positions shown, 19 images · IV contrast (agent unspecified)
Comparison: None.

CLINICAL DATA: Decreased hearing, with pain and drainage from left
ear.

CT TEMPORAL BONES WITHOUT CONTRAST:
TECHNIQUE: Axial and coronal plane CT imaging of the petrous
temporal bones was performed with thin-collimation image
reconstruction.  No intravenous contrast was administered.
Multiplanar CT image recontructions were also generated.

[Series 3: rt/mag/bone · axial · 0.19mm/px · z∈[-24,+11]mm · 5 of 168 slices shown, 7 images]
[im 28/168  brain]
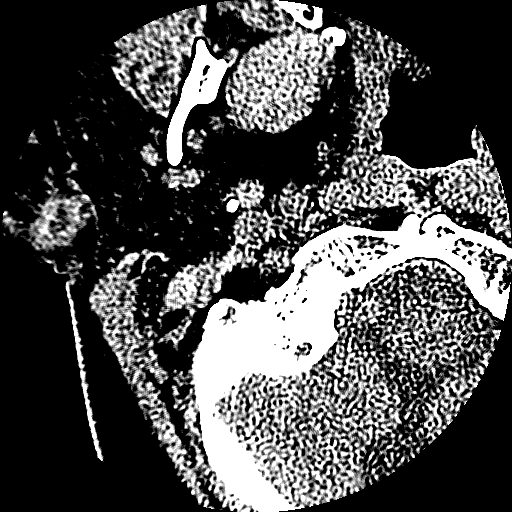
[im 28/168  bone]
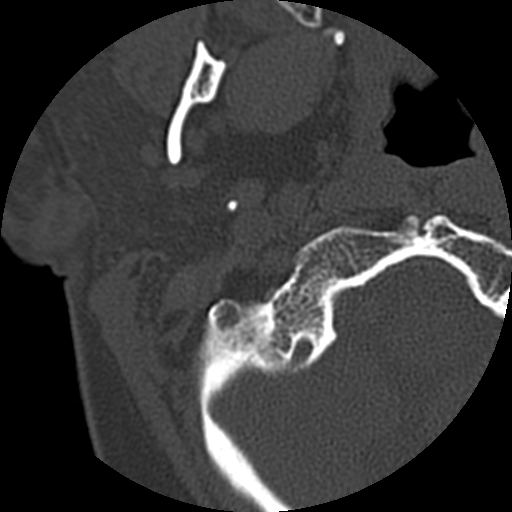
[im 56/168  bone]
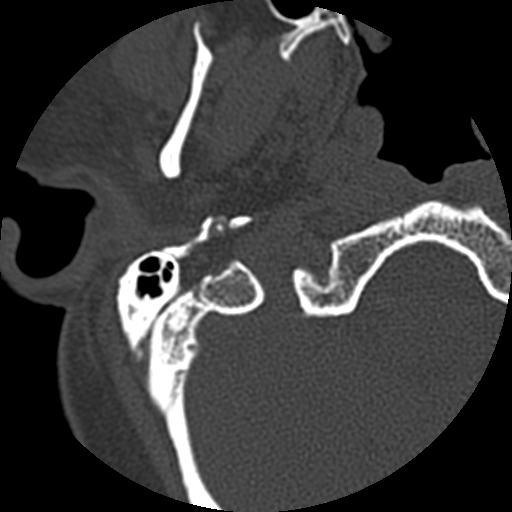
[im 84/168  bone]
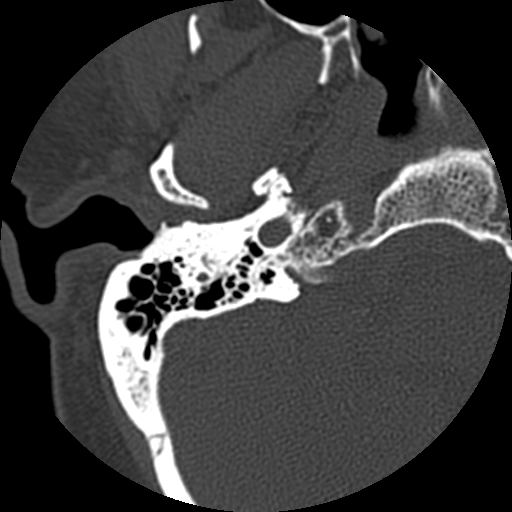
[im 112/168  bone]
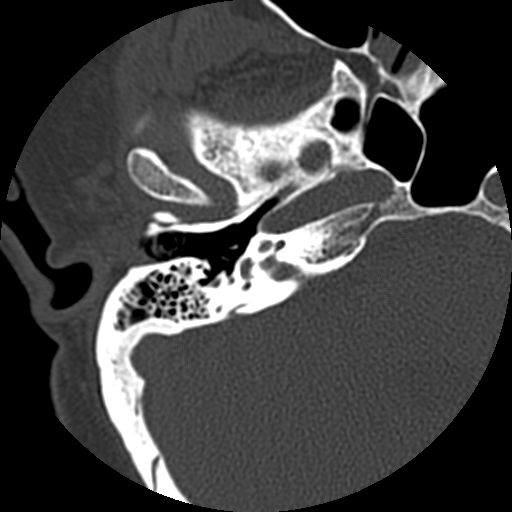
[im 140/168  brain]
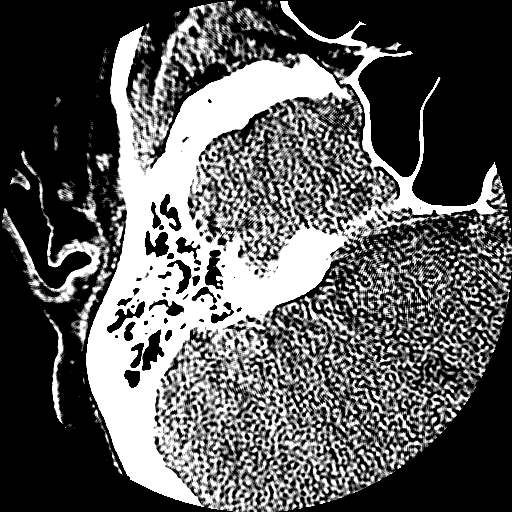
[im 140/168  bone]
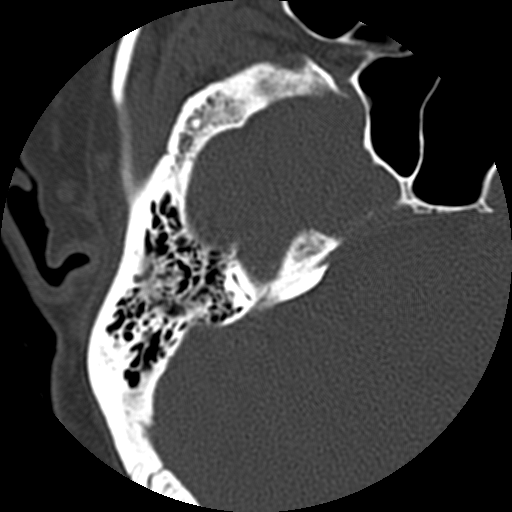

[Series 4: lt/mag/bone · axial · 0.19mm/px · z∈[-24,+11]mm · 5 of 168 slices shown]
[im 28/168  bone]
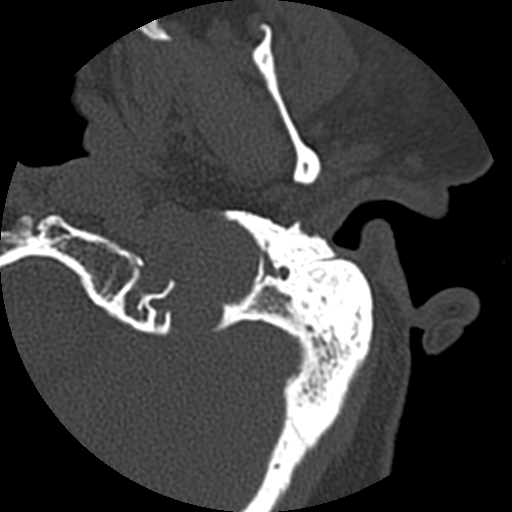
[im 56/168  bone]
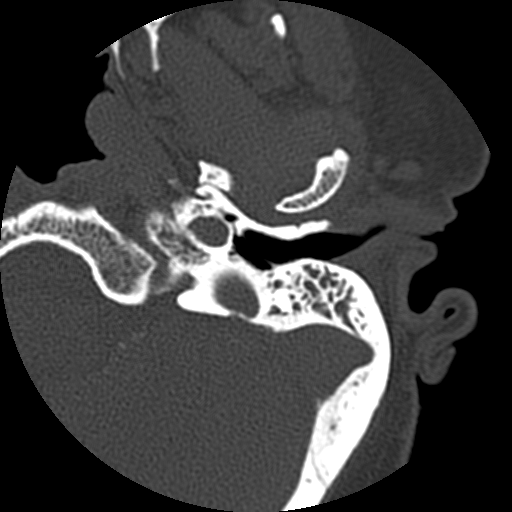
[im 84/168  bone]
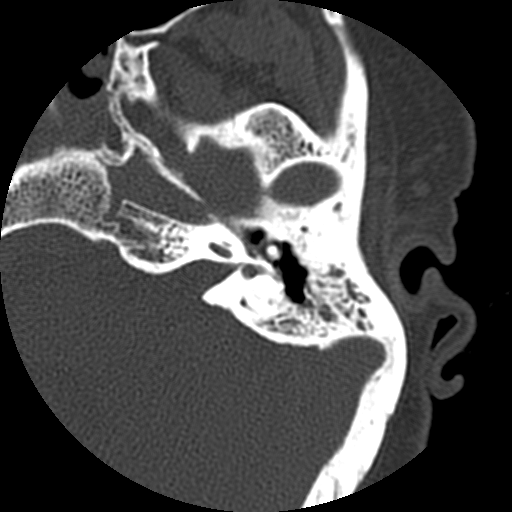
[im 112/168  bone]
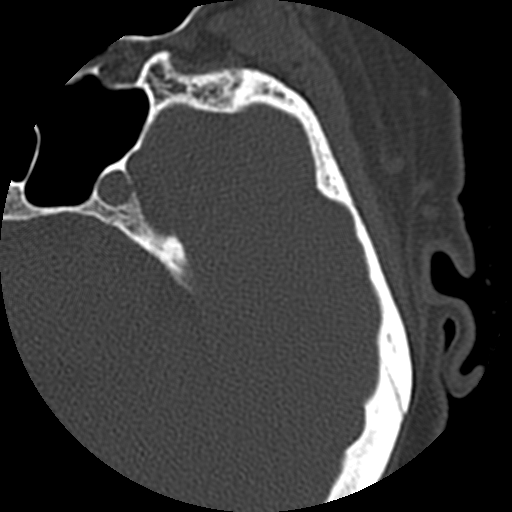
[im 140/168  bone]
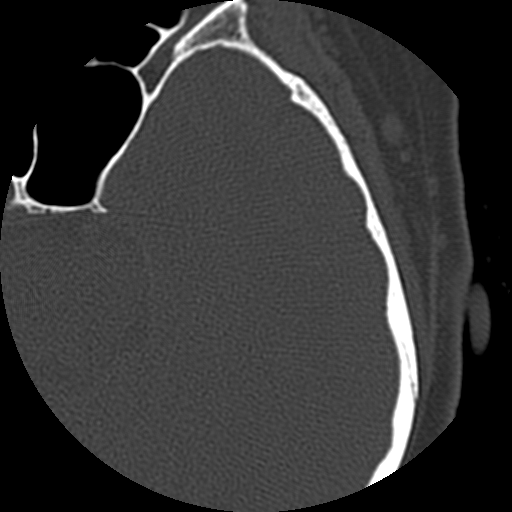

[Series 300: rt cor temp bone · coronal · 0.19mm/px · 2 of 158 slices shown]
[im 32/158  bone]
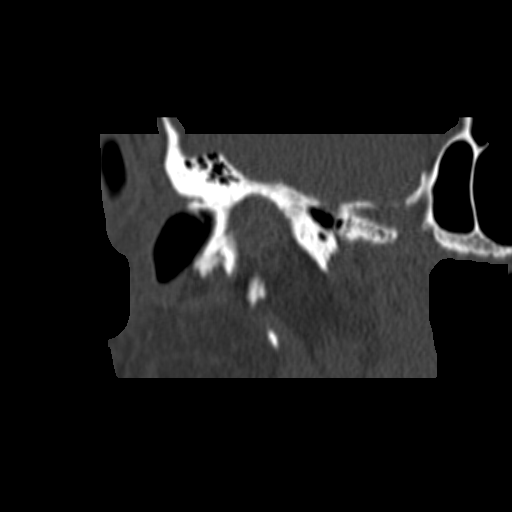
[im 63/158  bone]
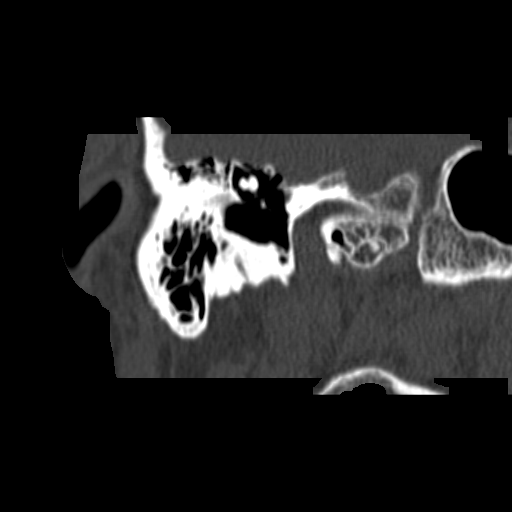

[Series 400: lt cor temp bone · coronal · 0.19mm/px · 5 of 162 slices shown]
[im 27/162  bone]
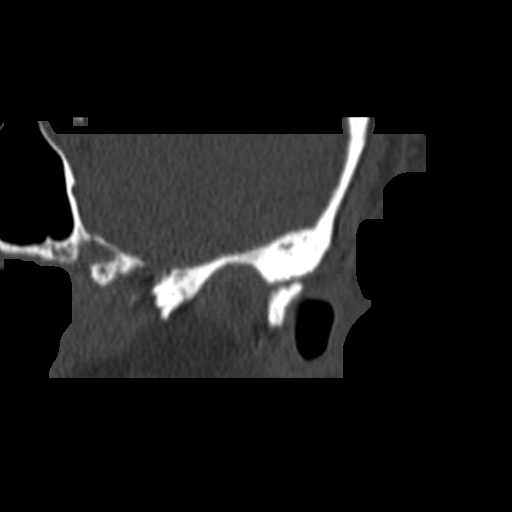
[im 54/162  bone]
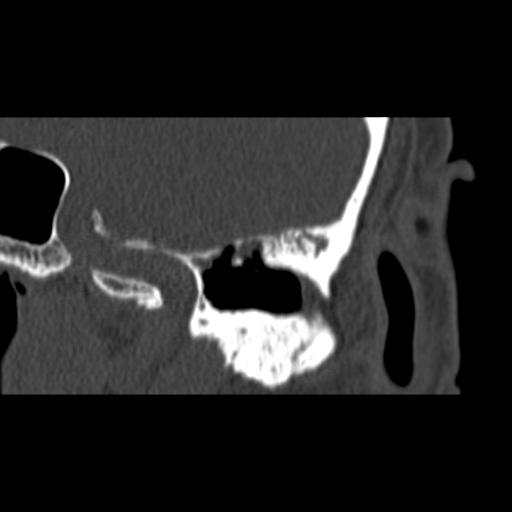
[im 81/162  bone]
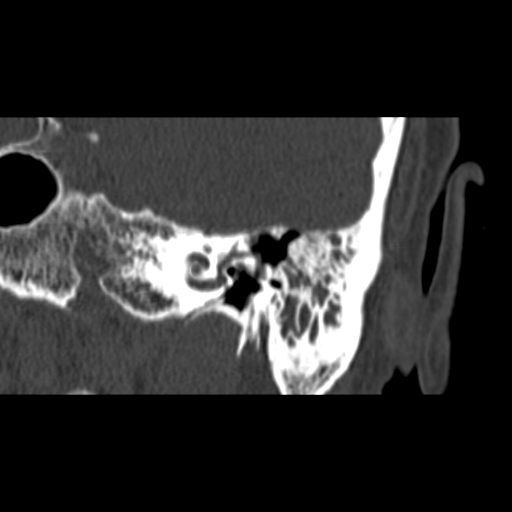
[im 108/162  bone]
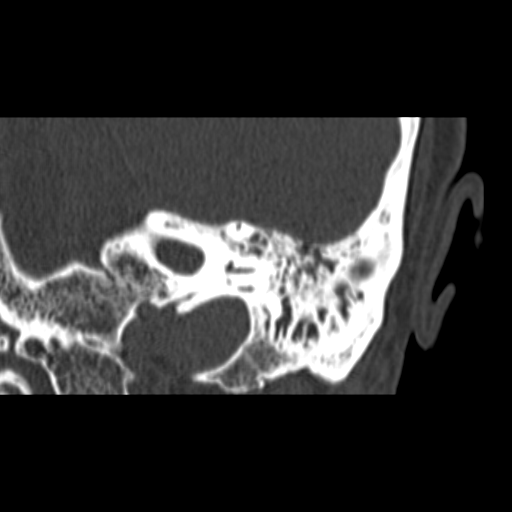
[im 135/162  bone]
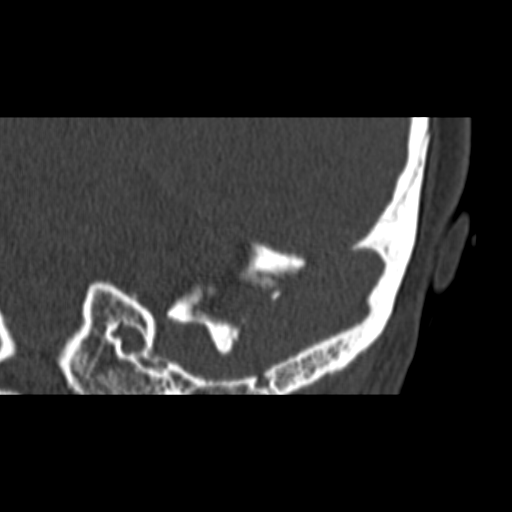

[17 of 30 positions shown; findings below may reference images not displayed]

FINDINGS: On the RIGHT, there is moderate cerumen in the external
canal.  The tympanic membrane is   normally positioned without
perforation.  The ossicles are normal.  There is no middle ear
fluid.  There is a minimal amount of uncomplicated mastoid fluid.
Inner ear structures are normal.

On the LEFT, the external canal is widely patent.  The left
tympanic membrane is thin but retracted. There may be a perforation
superiorly. There is fluid  and/or cholesteatoma surrounding the
head of the malleus.  I do not see definite erosion of the malleus
or other ossicles, however there is soft tissue is adjacent to the
tegmen tympani.  There is no definite bony tegmental destruction.
The mastoid is filled with a significant amount of fluid.  There is
some coalescence of mastoid air cells with a likely cholesteatoma.
The inner ear structures on the left are normal.
IMPRESSION: Constellation of findings in the left middle ear and mastoid
suggest cholesteatoma in one or both locations; while there is
definite coalescence of mastoid air cells, I see no clear-cut
destruction of the ossicles or tegmen tympani/mastoideum.  See
comments above.

## 2009-04-29 IMAGING — CR DG CHEST 2V
2 series · 2 of 2 positions shown · non-contrast
Comparison: [DATE]

CLINICAL DATA: COPD, cough, shortness of breath, chest pain,
smoker, hypertension

CHEST - 2 VIEW

[view not recorded (1 of 2)]
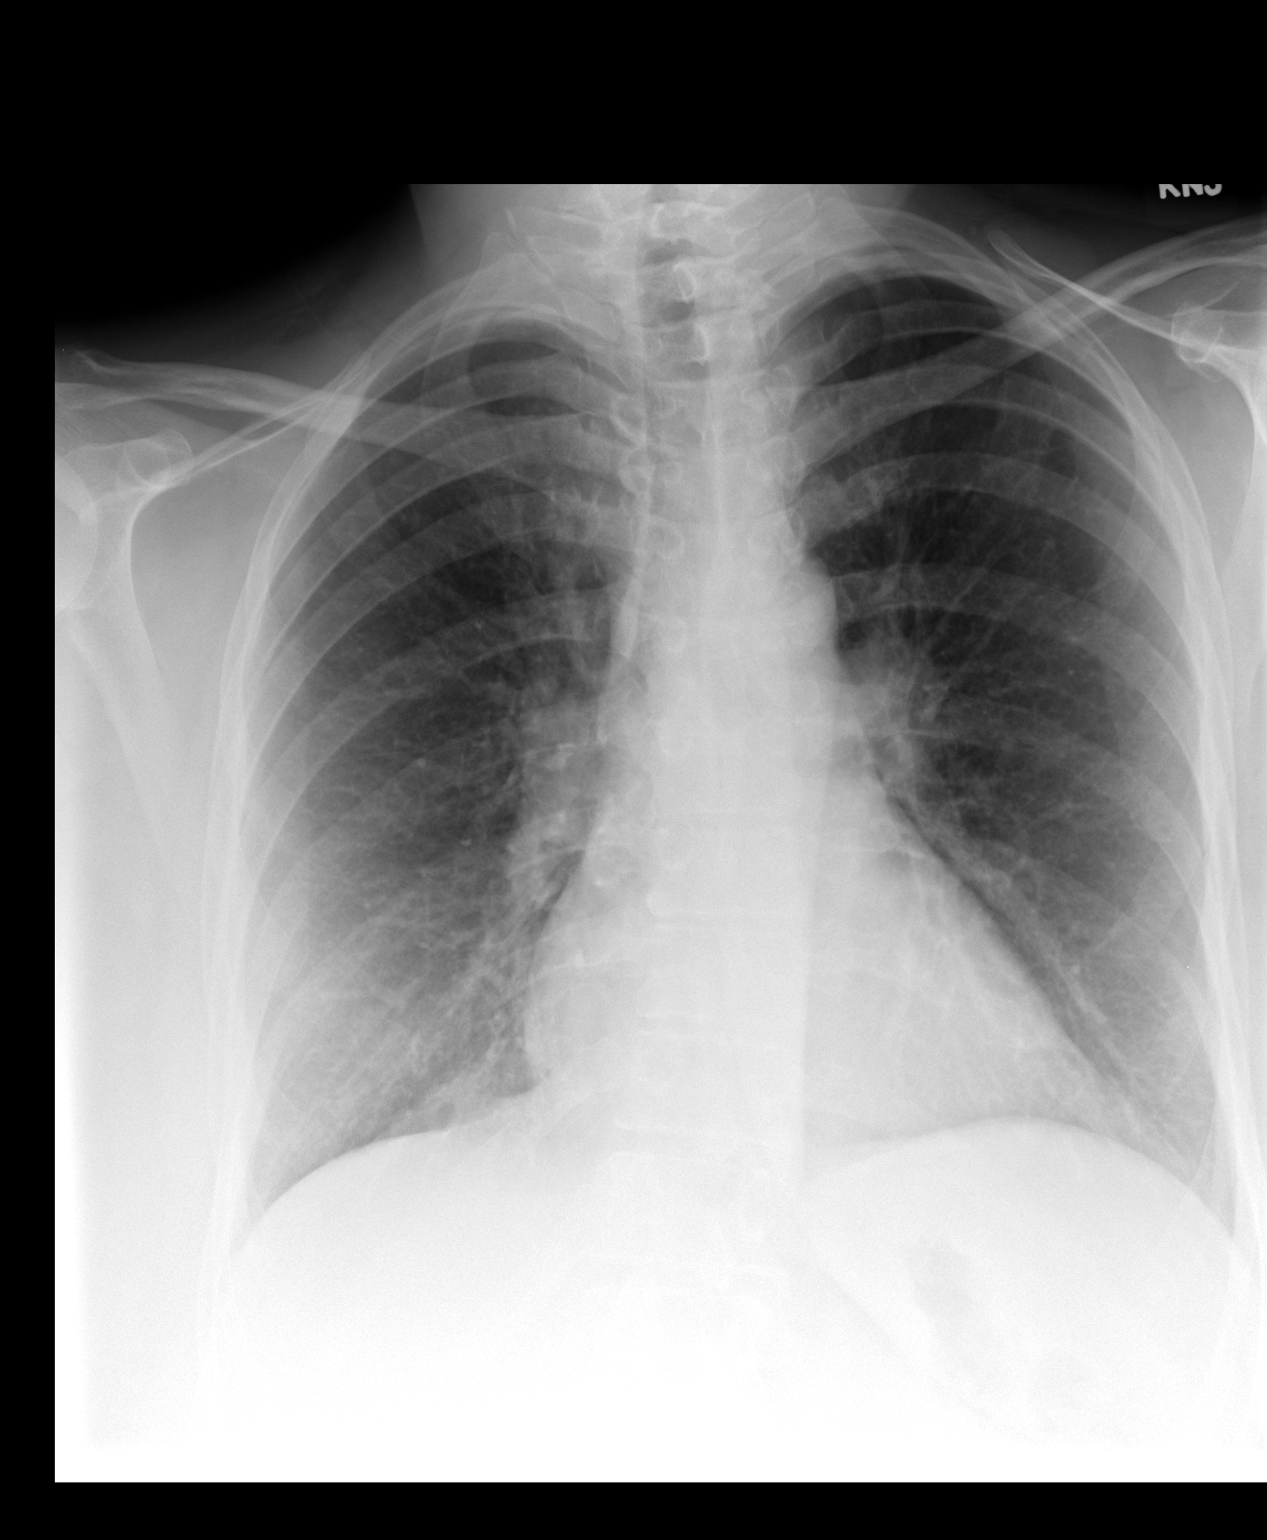

[view not recorded (2 of 2)]
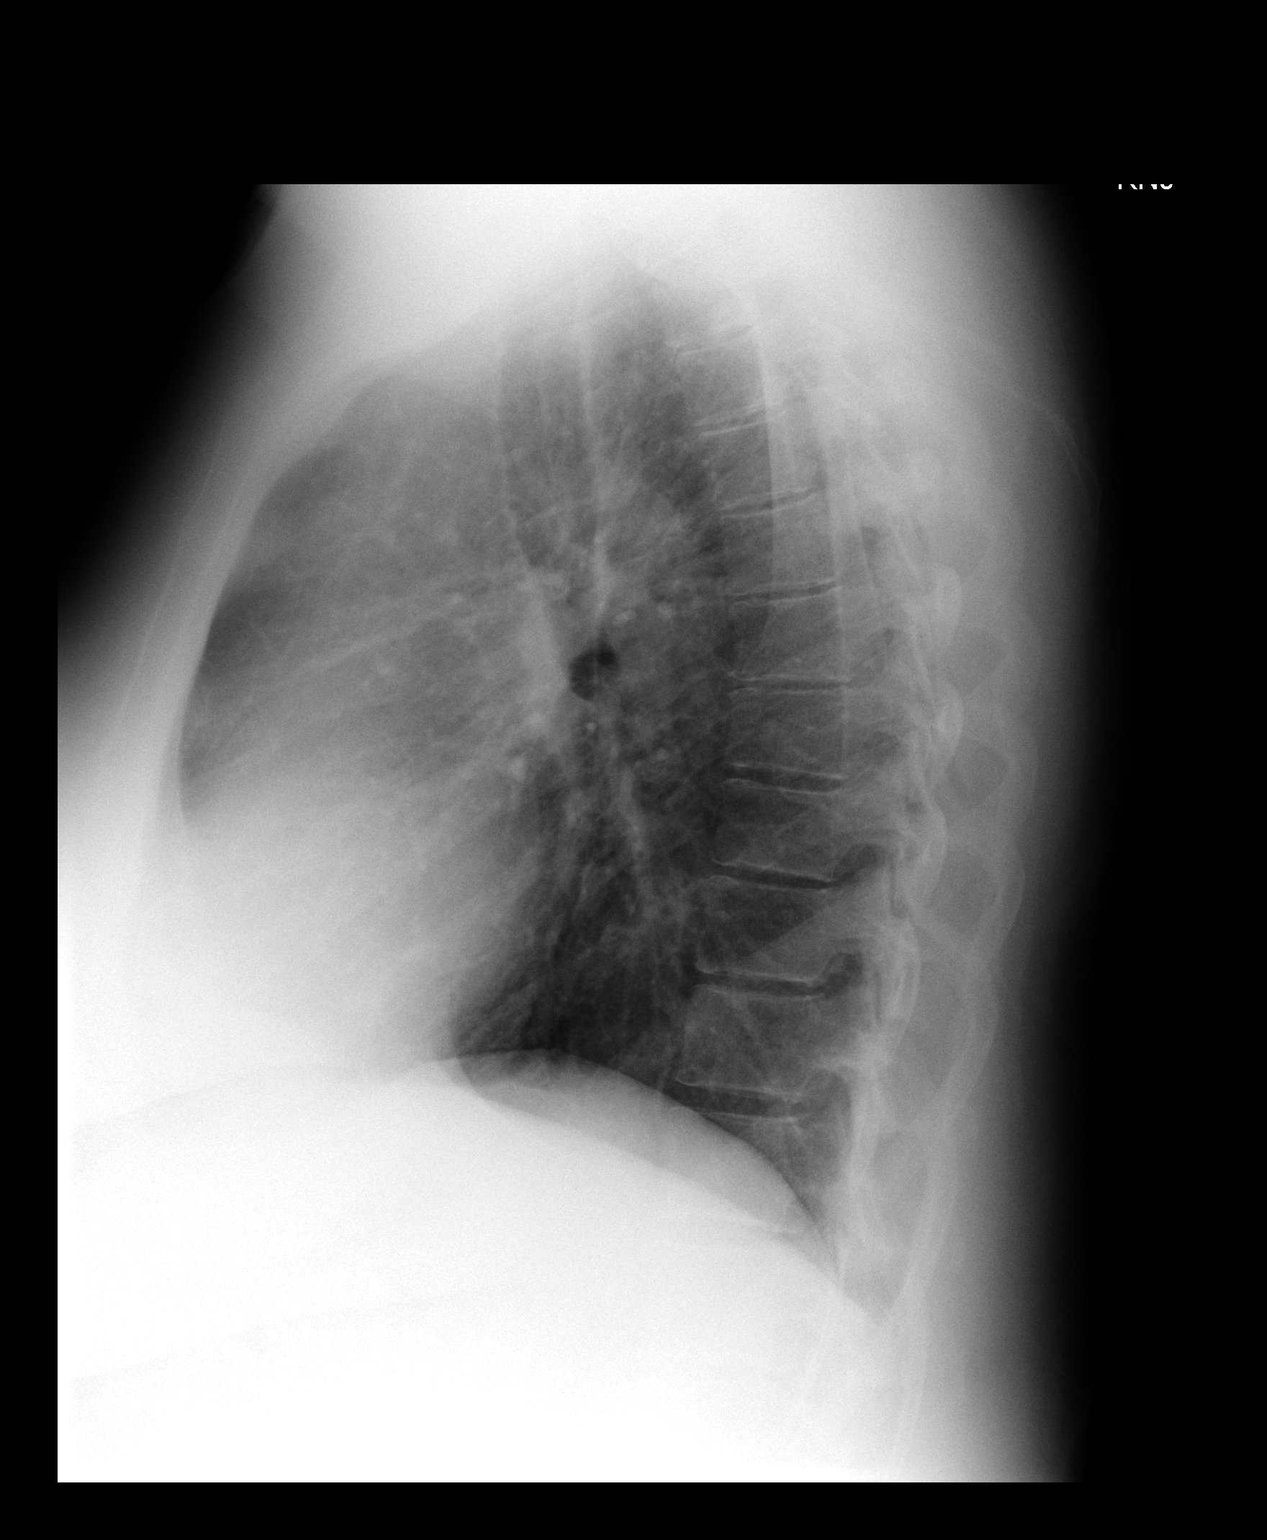

[2 of 2 positions shown; findings below may reference images not displayed]

FINDINGS: Upper normal heart size.
Normal mediastinal contours and pulmonary vascularity.
Lungs clear.
Bones unremarkable.
IMPRESSION: No acute abnormalities.

## 2009-12-12 ENCOUNTER — Emergency Department (HOSPITAL_COMMUNITY): Admission: EM | Admit: 2009-12-12 | Discharge: 2009-12-12 | Payer: Self-pay | Admitting: Emergency Medicine

## 2009-12-18 ENCOUNTER — Emergency Department (HOSPITAL_COMMUNITY): Admission: EM | Admit: 2009-12-18 | Discharge: 2009-12-18 | Payer: Self-pay | Admitting: Emergency Medicine

## 2009-12-18 IMAGING — CT CT CHEST W/O CM
2 of 3 series · 15 of 36 positions shown, 18 images · non-contrast
Comparison: Chest radiograph [DATE]

CLINICAL DATA: Abnormal chest x-ray, question left upper lobe
pulmonary nodule

CT CHEST WITHOUT CONTRAST
TECHNIQUE: Multidetector CT imaging of the chest was performed
following the standard protocol without IV contrast. Breast shield
utilized.  Sagittal and coronal MPR images reconstructed from axial
data set.

[Series 2: chestroutine 5.0 b40f · axial · 0.80mm/px · z∈[+770,+1020]mm · 12 of 60 slices shown, 15 images]
[im 5/60  mediastinal]
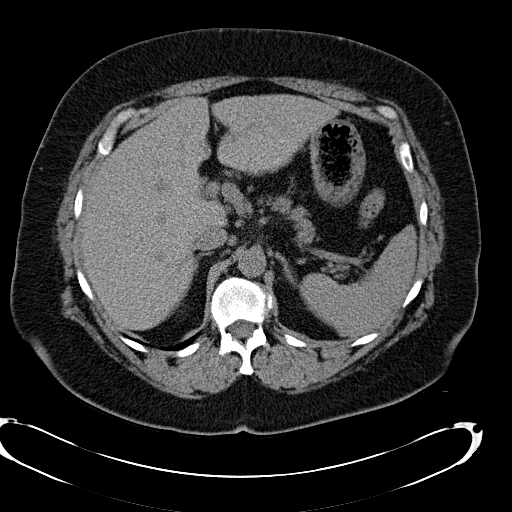
[im 5/60  lung]
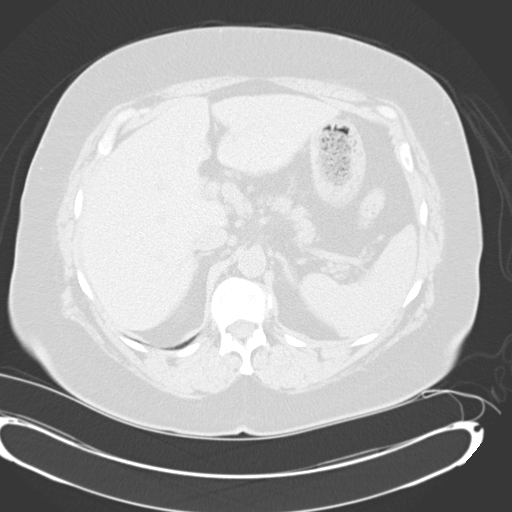
[im 9/60  lung]
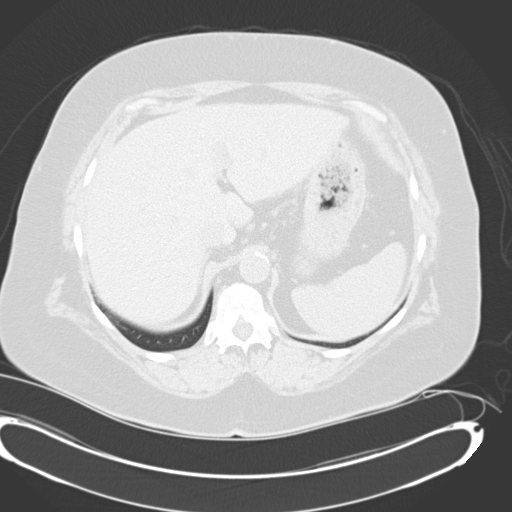
[im 14/60  lung]
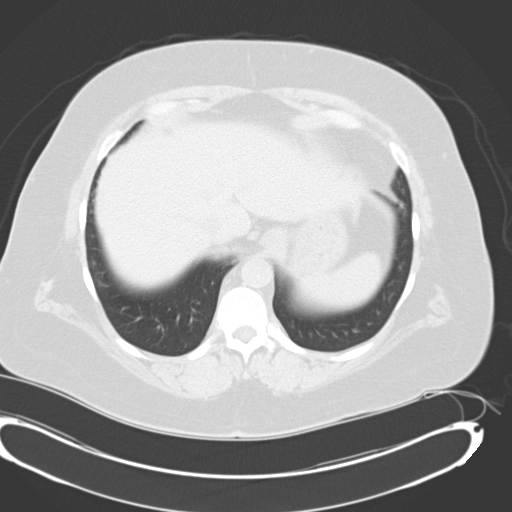
[im 18/60  lung]
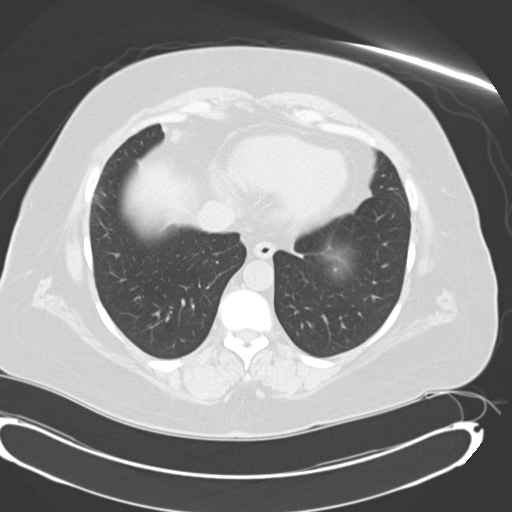
[im 22/60  mediastinal]
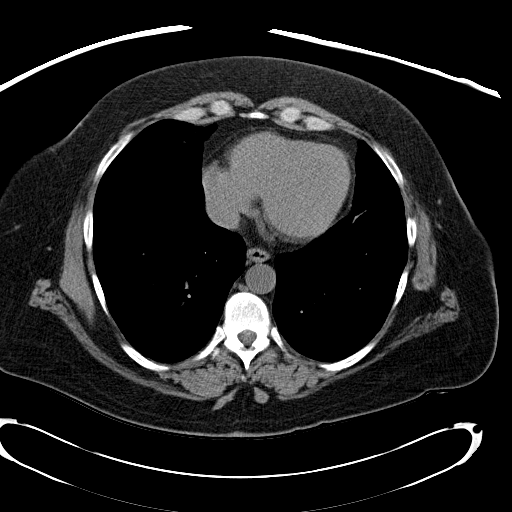
[im 22/60  lung]
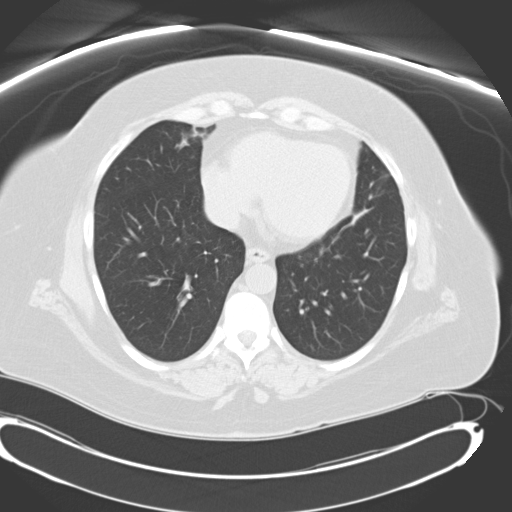
[im 27/60  lung]
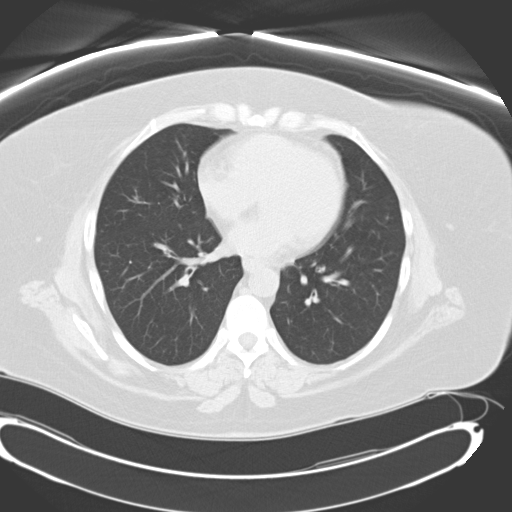
[im 33/60  lung]
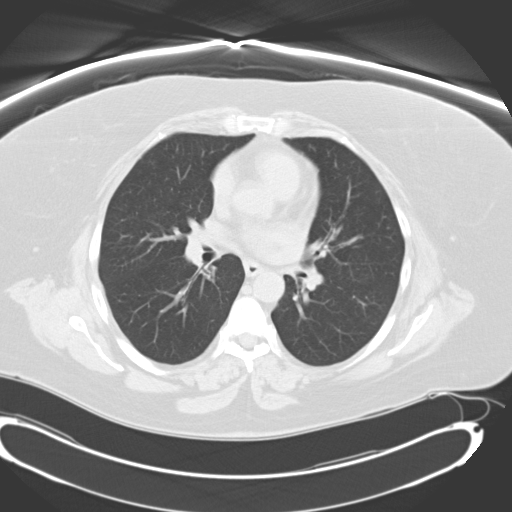
[im 38/60  lung]
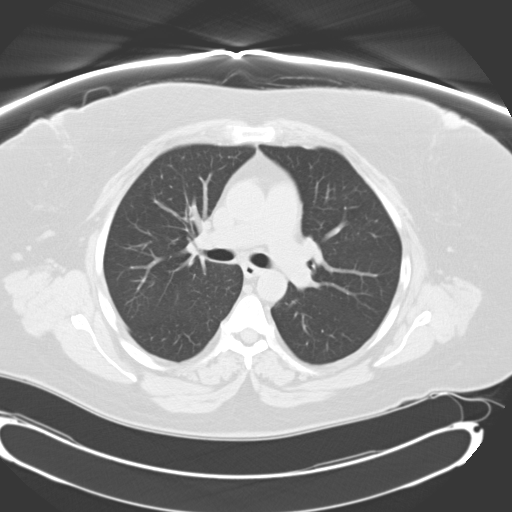
[im 42/60  mediastinal]
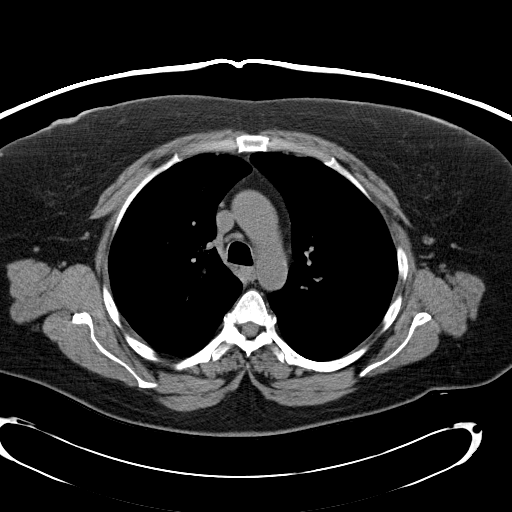
[im 42/60  lung]
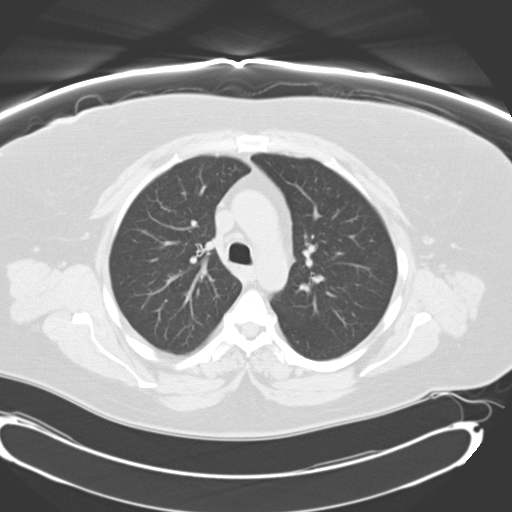
[im 46/60  lung]
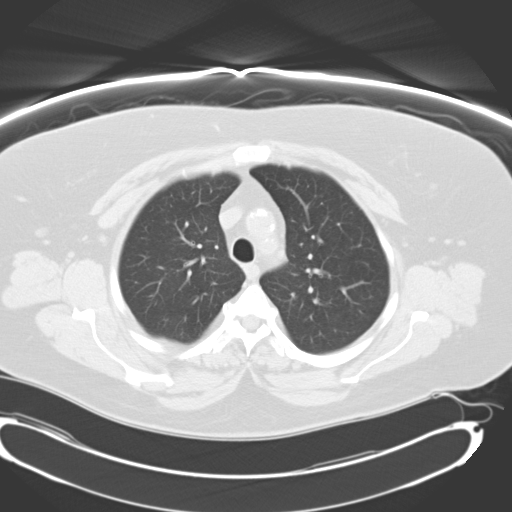
[im 51/60  lung]
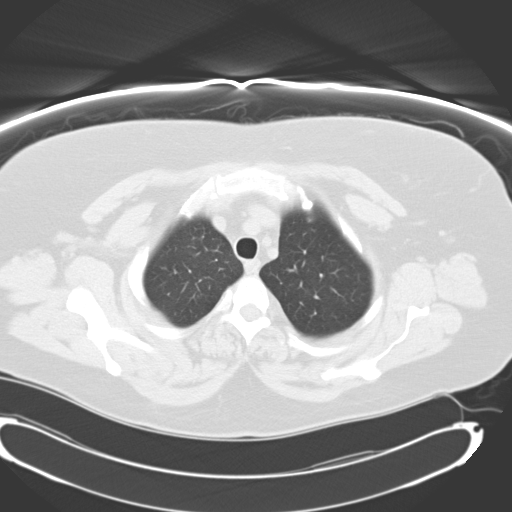
[im 55/60  lung]
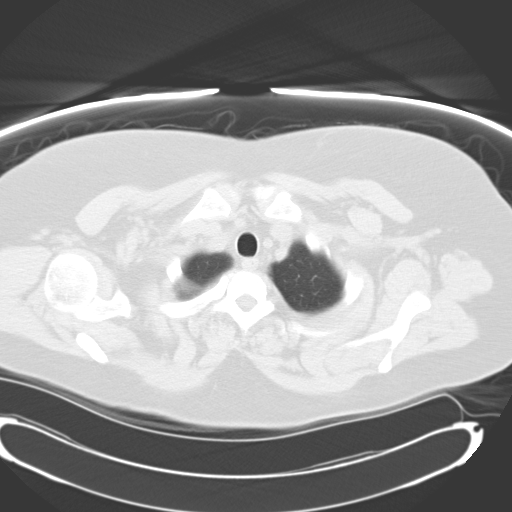

[Series 4: mpr coro 3mm · coronal · 0.61mm/px · 3 of 76 slices shown]
[im 16/76  lung]
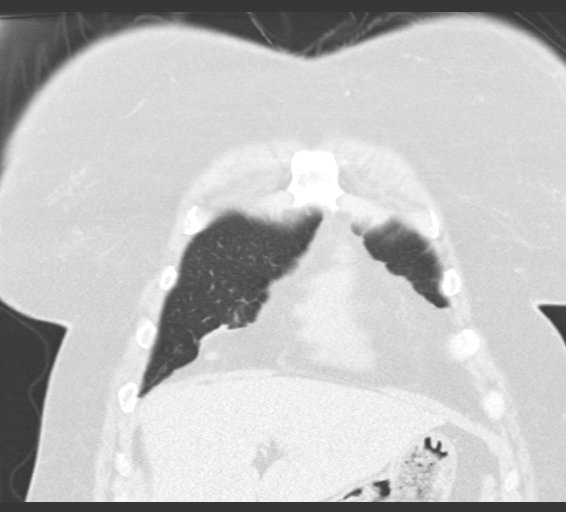
[im 31/76  lung]
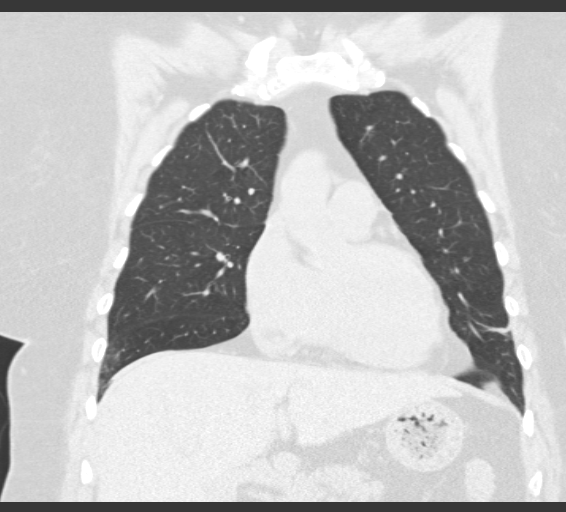
[im 46/76  lung]
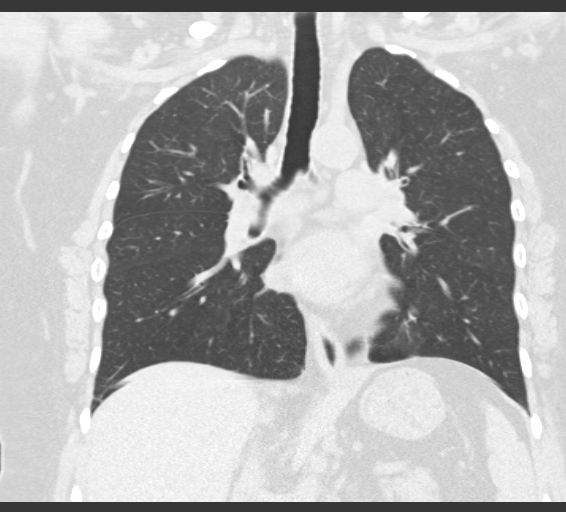

[15 of 36 positions shown; findings below may reference images not displayed]

FINDINGS: Mild atherosclerotic calcifications of normal caliber thoracic
aorta.
No definite thoracic adenopathy, hilar assessment limited by lack
of IV contrast.
Visualized portion of upper abdomen unremarkable.
Minimal atelectasis at bases of right middle lobe and lingula.
Very minimal subpleural atelectasis or infiltrate at lateral right
lung base anteriorly.
Mild peribronchial thickening centrally.
No pulmonary mass or nodule.
Small spur at first costochondral junction, likely accounting for
chest radiographic findings.
No acute bony findings.
IMPRESSION: No evidence of pulmonary nodule or mass.
Minimal bibasilar atelectasis anteriorly.
Minimal atelectasis or infiltrate at anterolateral right lung base.

## 2009-12-25 ENCOUNTER — Emergency Department (HOSPITAL_COMMUNITY): Admission: EM | Admit: 2009-12-25 | Discharge: 2009-12-25 | Payer: Self-pay | Admitting: Emergency Medicine

## 2009-12-29 ENCOUNTER — Ambulatory Visit (HOSPITAL_BASED_OUTPATIENT_CLINIC_OR_DEPARTMENT_OTHER): Admission: RE | Admit: 2009-12-29 | Discharge: 2009-12-29 | Payer: Self-pay | Admitting: Otolaryngology

## 2010-01-25 ENCOUNTER — Emergency Department (HOSPITAL_COMMUNITY): Admission: EM | Admit: 2010-01-25 | Discharge: 2010-01-25 | Payer: Self-pay | Admitting: Emergency Medicine

## 2010-02-12 ENCOUNTER — Emergency Department (HOSPITAL_COMMUNITY): Admission: EM | Admit: 2010-02-12 | Discharge: 2010-02-12 | Payer: Self-pay | Admitting: Emergency Medicine

## 2010-03-21 ENCOUNTER — Ambulatory Visit: Admission: RE | Admit: 2010-03-21 | Discharge: 2010-03-21 | Payer: Self-pay | Admitting: Neurology

## 2010-08-17 ENCOUNTER — Emergency Department (HOSPITAL_COMMUNITY): Admission: EM | Admit: 2010-08-17 | Discharge: 2010-08-17 | Payer: Self-pay | Admitting: Emergency Medicine

## 2010-08-17 IMAGING — CR DG CHEST 2V
2 series · 2 of 2 positions shown · non-contrast
Comparison: CT [DATE], chest radiograph [DATE]

CLINICAL DATA: Cough, shortness of breath

CHEST - 2 VIEW

[view not recorded (1 of 2)]
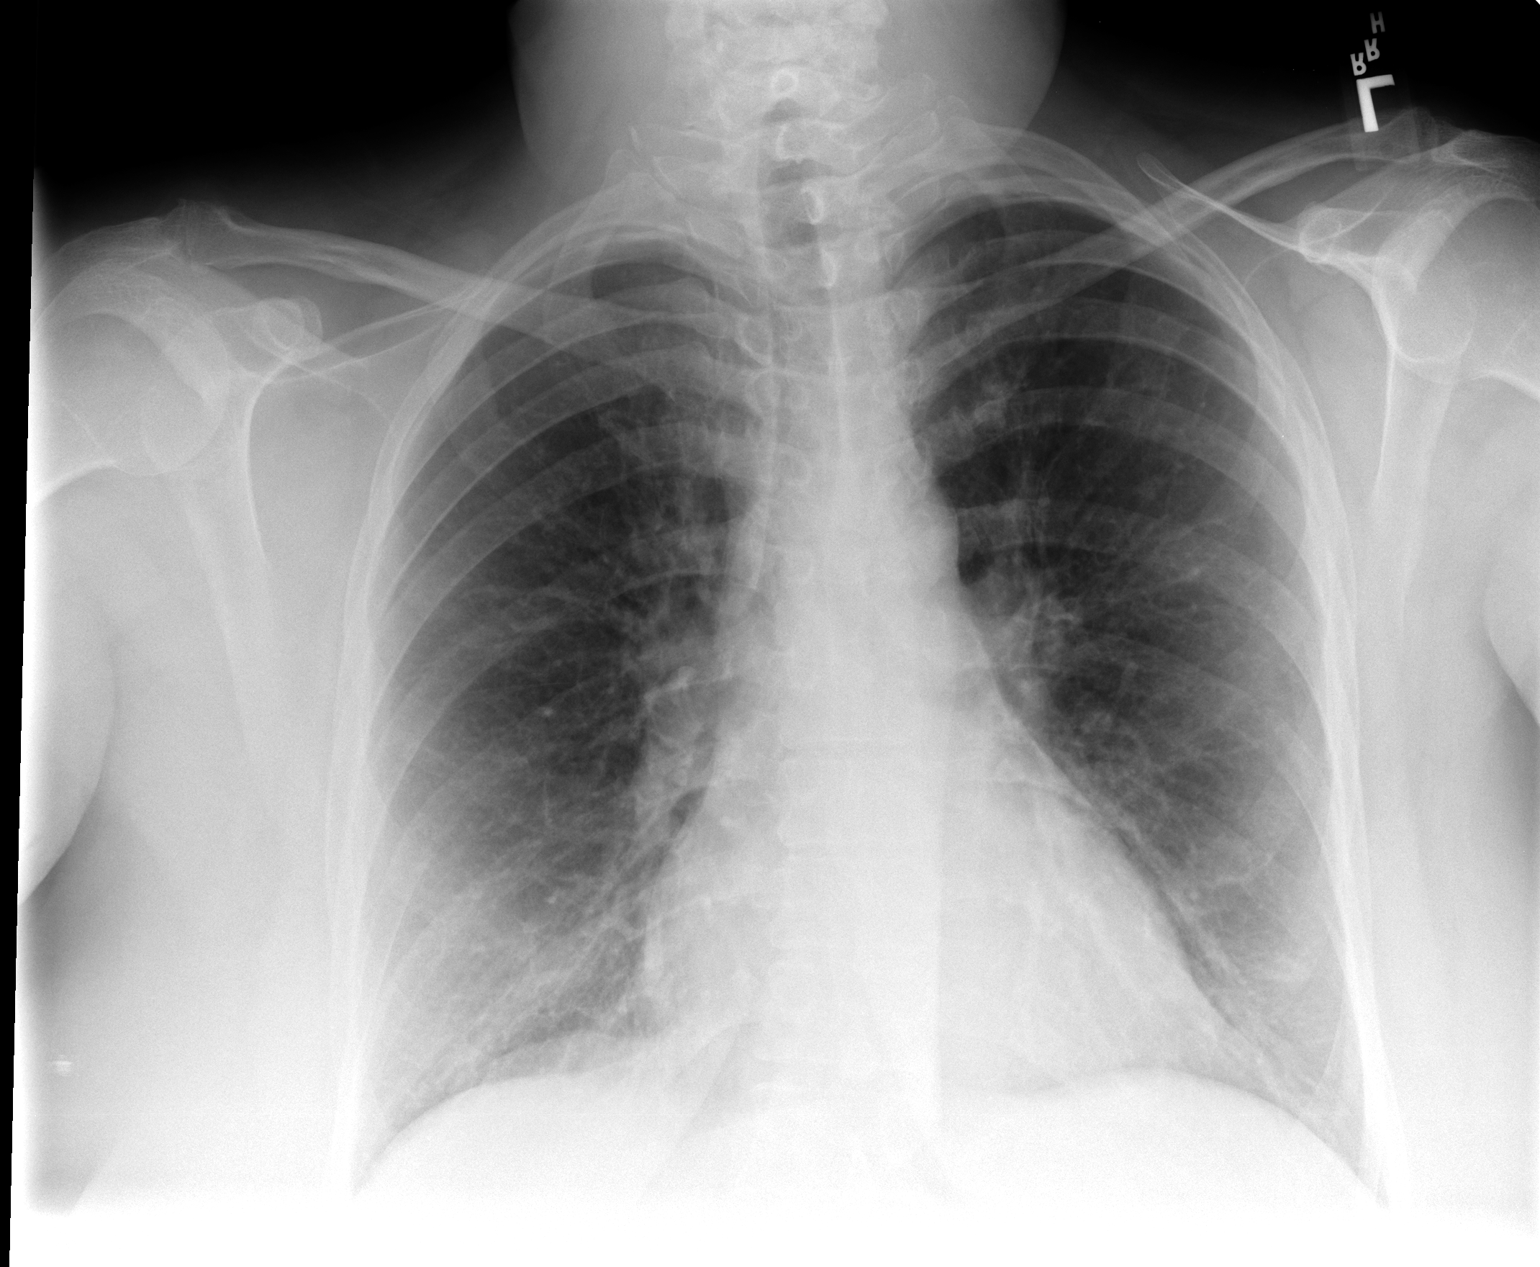

[view not recorded (2 of 2)]
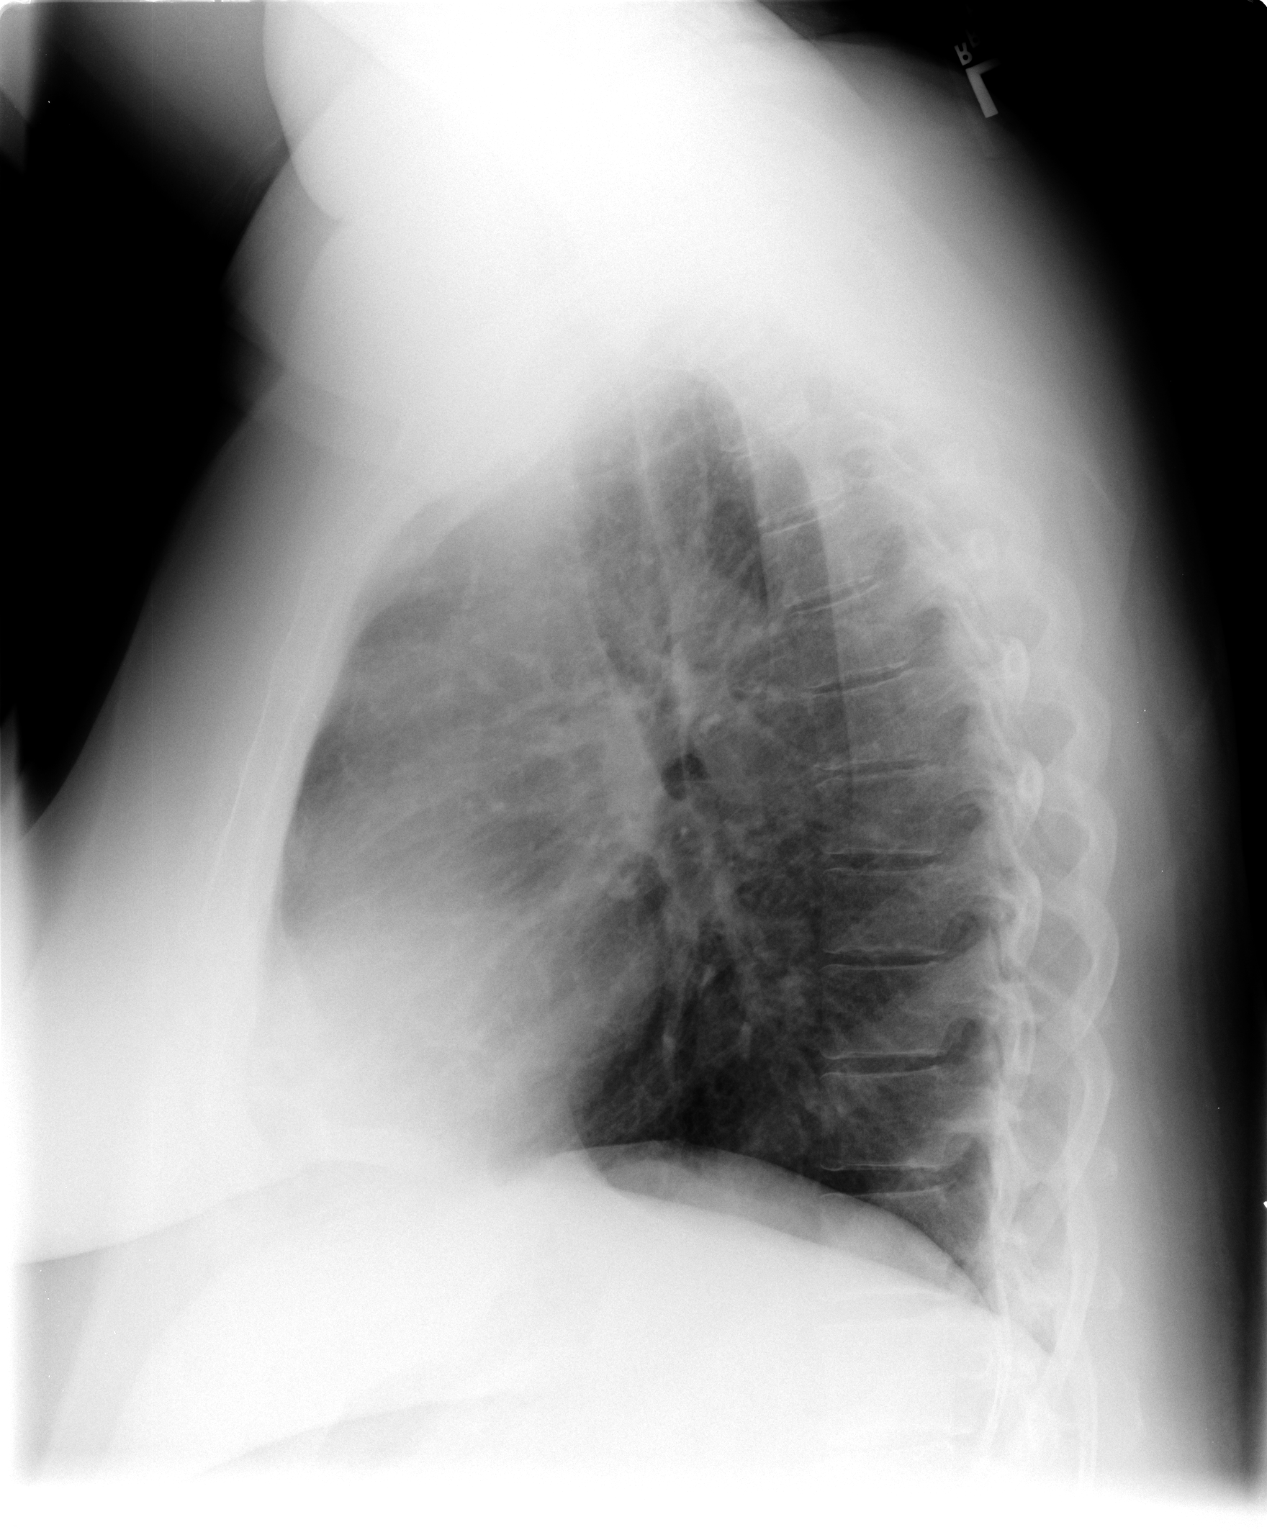

[2 of 2 positions shown; findings below may reference images not displayed]

FINDINGS: Cardiomediastinal silhouette is within normal limits. The
lungs are clear. No pleural effusion.  No pneumothorax.  No acute
osseous abnormality.
IMPRESSION: Normal chest.

## 2010-09-08 ENCOUNTER — Emergency Department (HOSPITAL_COMMUNITY): Admission: EM | Admit: 2010-09-08 | Discharge: 2010-09-08 | Payer: Self-pay | Admitting: Emergency Medicine

## 2010-09-08 IMAGING — CR DG CHEST 2V
2 series · 2 of 2 positions shown · non-contrast
Comparison: [DATE]

CLINICAL DATA: Short of breath.  Back pain.  Smoking history.

CHEST - 2 VIEW

[view not recorded (1 of 2)]
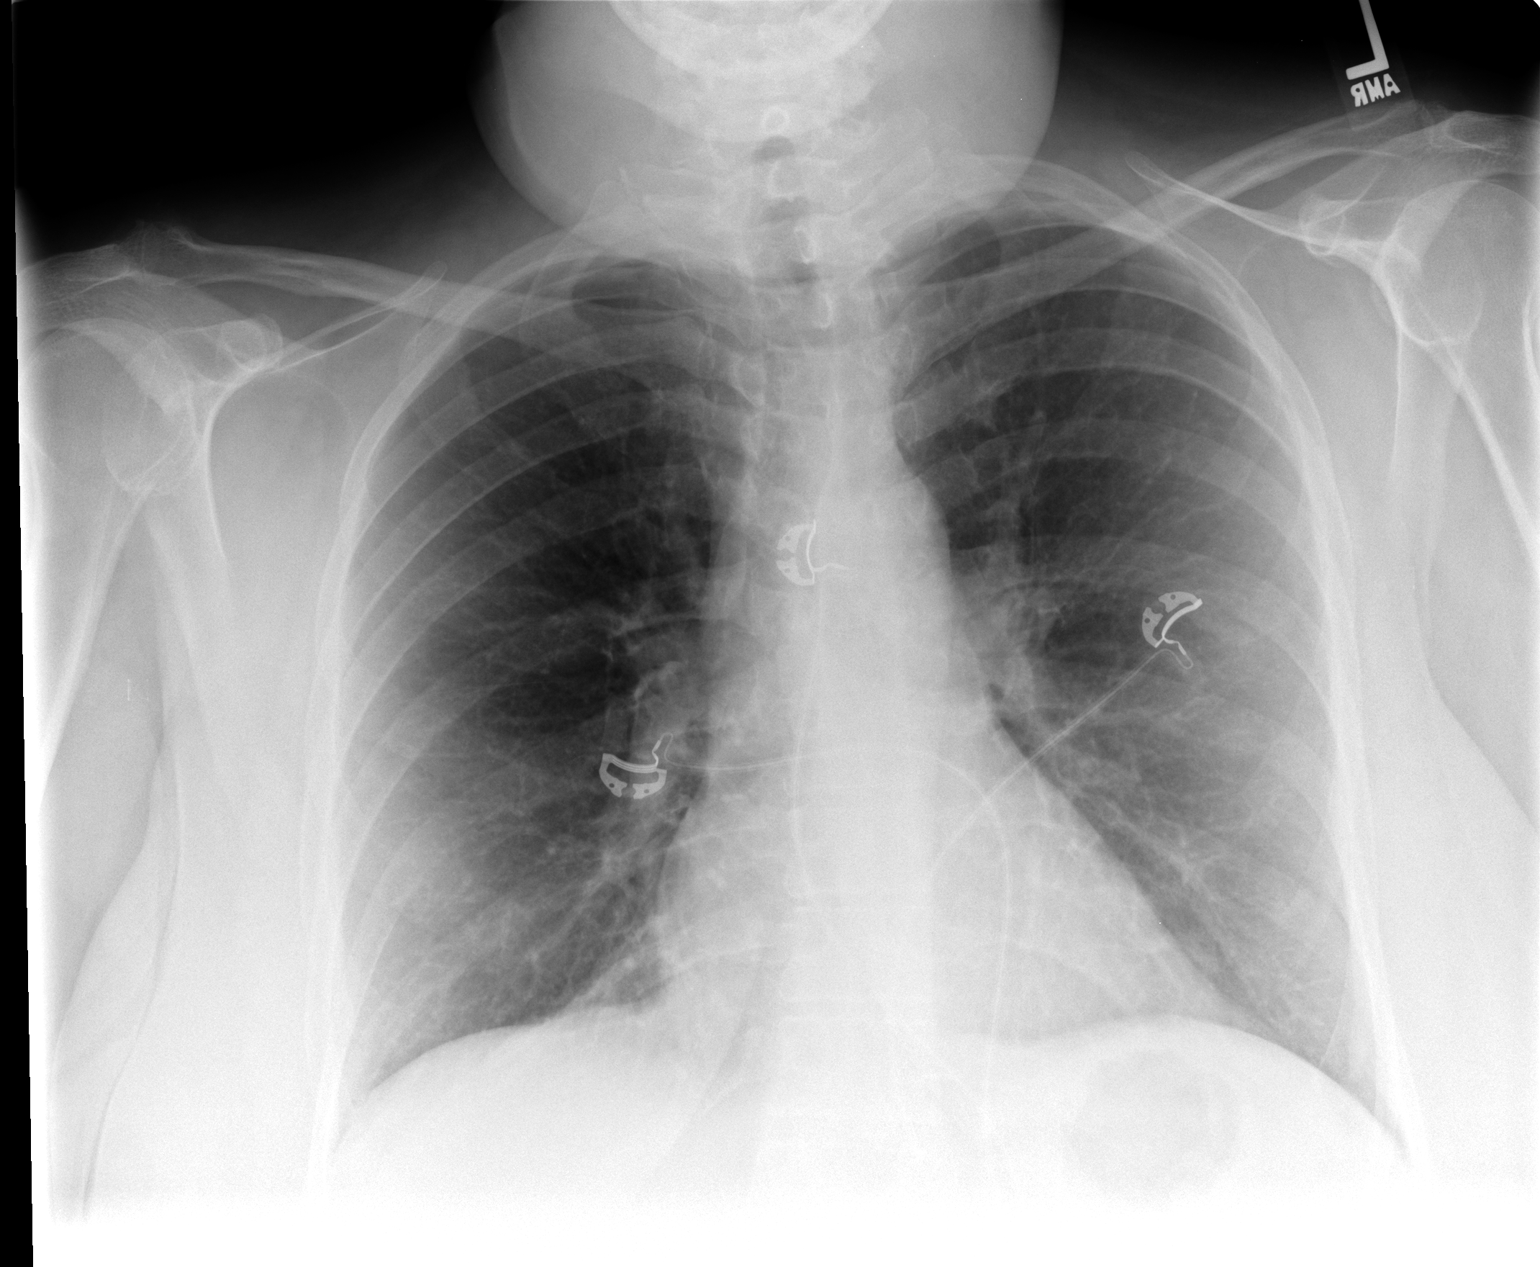

[view not recorded (2 of 2)]
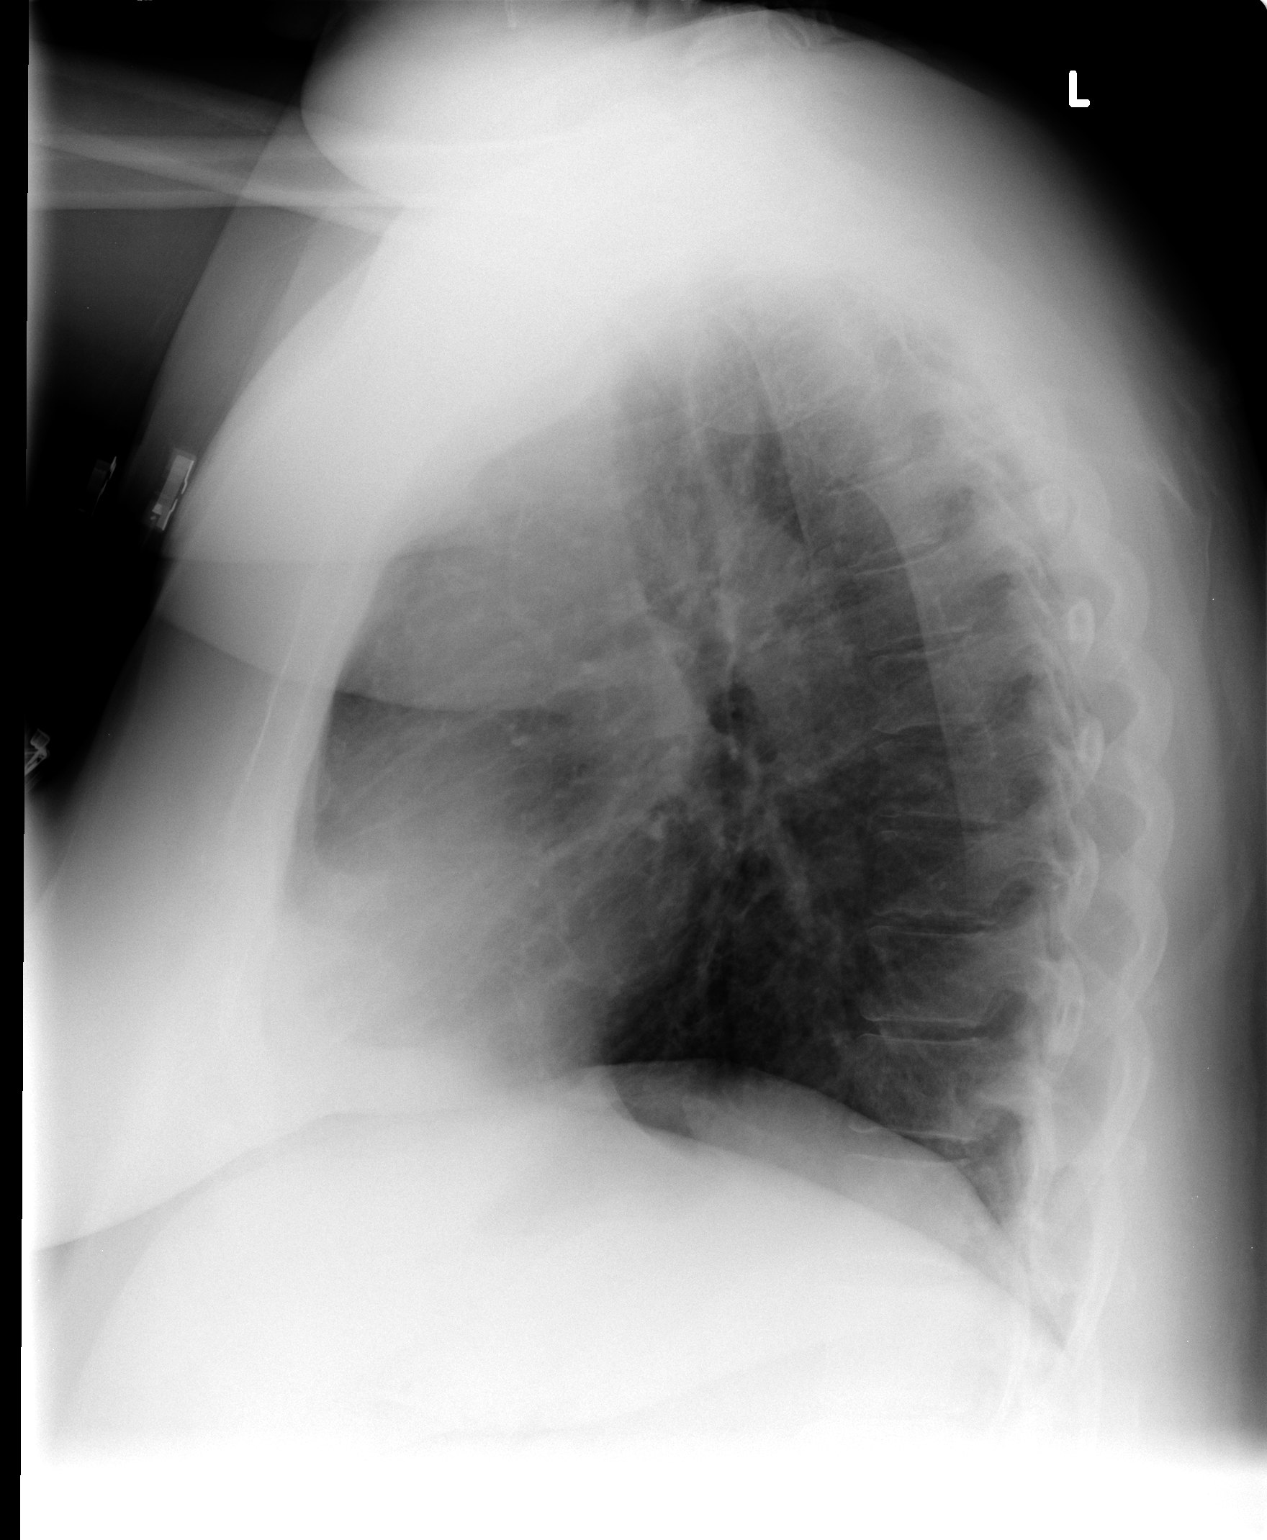

[2 of 2 positions shown; findings below may reference images not displayed]

FINDINGS: Artifact overlies chest.  Heart size is normal.  Lungs
are clear.  No effusions.  No bony abnormality.
IMPRESSION: Normal chest

## 2011-01-01 ENCOUNTER — Encounter: Payer: Self-pay | Admitting: Internal Medicine

## 2011-02-27 LAB — POCT I-STAT, CHEM 8
Calcium, Ion: 1.19 mmol/L (ref 1.12–1.32)
HCT: 44 % (ref 36.0–46.0)
Hemoglobin: 15 g/dL (ref 12.0–15.0)
Potassium: 3.8 mEq/L (ref 3.5–5.1)
TCO2: 29 mmol/L (ref 0–100)

## 2011-09-11 ENCOUNTER — Emergency Department (HOSPITAL_COMMUNITY)
Admission: EM | Admit: 2011-09-11 | Discharge: 2011-09-12 | Disposition: A | Discharge status: Home / Self Care | Payer: Medicaid Other | Attending: Emergency Medicine | Admitting: Emergency Medicine

## 2011-09-11 ENCOUNTER — Encounter: Payer: Self-pay | Admitting: *Deleted

## 2011-09-11 DIAGNOSIS — F17200 Nicotine dependence, unspecified, uncomplicated: Secondary | ICD-10-CM | POA: Insufficient documentation

## 2011-09-11 DIAGNOSIS — T63461A Toxic effect of venom of wasps, accidental (unintentional), initial encounter: Secondary | ICD-10-CM | POA: Insufficient documentation

## 2011-09-11 DIAGNOSIS — J449 Chronic obstructive pulmonary disease, unspecified: Secondary | ICD-10-CM | POA: Insufficient documentation

## 2011-09-11 DIAGNOSIS — T6391XA Toxic effect of contact with unspecified venomous animal, accidental (unintentional), initial encounter: Secondary | ICD-10-CM | POA: Insufficient documentation

## 2011-09-11 DIAGNOSIS — J4489 Other specified chronic obstructive pulmonary disease: Secondary | ICD-10-CM | POA: Insufficient documentation

## 2011-09-11 DIAGNOSIS — T63441A Toxic effect of venom of bees, accidental (unintentional), initial encounter: Secondary | ICD-10-CM

## 2011-09-11 HISTORY — DX: Chronic obstructive pulmonary disease, unspecified: J44.9

## 2011-09-11 LAB — DIFFERENTIAL
Basophils Absolute: 0.1 10*3/uL (ref 0.0–0.1)
Basophils Relative: 0 % (ref 0–1)
Eosinophils Relative: 10 % — ABNORMAL HIGH (ref 0–5)
Monocytes Absolute: 1.1 10*3/uL — ABNORMAL HIGH (ref 0.1–1.0)

## 2011-09-11 LAB — CBC
HCT: 43.3 % (ref 36.0–46.0)
MCH: 30.8 pg (ref 26.0–34.0)
MCHC: 32.6 g/dL (ref 30.0–36.0)
MCV: 94.5 fL (ref 78.0–100.0)
RDW: 13.1 % (ref 11.5–15.5)

## 2011-09-11 LAB — BASIC METABOLIC PANEL
CO2: 31 mEq/L (ref 19–32)
Calcium: 8.8 mg/dL (ref 8.4–10.5)
Creatinine, Ser: 0.89 mg/dL (ref 0.50–1.10)
GFR calc Af Amer: >60 mL/min (ref >60)

## 2011-09-11 MED ORDER — FAMOTIDINE IN NACL 20-0.9 MG/50ML-% IV SOLN: 20.0000 mg | Freq: Once | INTRAVENOUS | Status: DC

## 2011-09-11 MED ORDER — DIPHENHYDRAMINE HCL 50 MG/ML IJ SOLN: 50.0000 mg | Freq: Once | INTRAMUSCULAR | Status: DC

## 2011-09-11 MED ORDER — FAMOTIDINE IN NACL 20-0.9 MG/50ML-% IV SOLN
INTRAVENOUS | Status: AC
  Filled 2011-09-11: qty 50

## 2011-09-11 MED ORDER — SODIUM CHLORIDE 0.9 % IV SOLN
Freq: Once | INTRAVENOUS | Status: AC
  Administered 2011-09-11: 23:00:00 via INTRAVENOUS

## 2011-09-11 MED ORDER — METHYLPREDNISOLONE SODIUM SUCC 125 MG IJ SOLR: 125.0000 mg | Freq: Once | INTRAMUSCULAR | Status: DC

## 2011-09-11 MED ORDER — IPRATROPIUM BROMIDE 0.02 % IN SOLN: 0.5000 mg | Freq: Once | RESPIRATORY_TRACT | Status: DC

## 2011-09-11 MED ORDER — DIPHENHYDRAMINE HCL 50 MG/ML IJ SOLN
INTRAMUSCULAR | Status: AC
  Filled 2011-09-11: qty 1

## 2011-09-11 MED ORDER — EPINEPHRINE 0.15 MG/0.3ML IJ DEVI
INTRAMUSCULAR | Status: AC
  Filled 2011-09-11: qty 0.3

## 2011-09-11 MED ORDER — SODIUM CHLORIDE 0.9 % IV SOLN: Freq: Once | INTRAVENOUS | Status: DC

## 2011-09-11 MED ORDER — METHYLPREDNISOLONE SODIUM SUCC 125 MG IJ SOLR
INTRAMUSCULAR | Status: AC
  Filled 2011-09-11: qty 2

## 2011-09-11 MED ORDER — IPRATROPIUM BROMIDE 0.02 % IN SOLN
0.5000 mg | Freq: Once | RESPIRATORY_TRACT | Status: AC
  Administered 2011-09-11: 0.5 mg via RESPIRATORY_TRACT
  Filled 2011-09-11: qty 2.5

## 2011-09-11 MED ORDER — ALBUTEROL SULFATE (5 MG/ML) 0.5% IN NEBU: 5.0000 mg | INHALATION_SOLUTION | Freq: Once | RESPIRATORY_TRACT | Status: DC

## 2011-09-11 MED ORDER — ALBUTEROL SULFATE (5 MG/ML) 0.5% IN NEBU
5.0000 mg | INHALATION_SOLUTION | Freq: Once | RESPIRATORY_TRACT | Status: AC
  Administered 2011-09-11: 5 mg via RESPIRATORY_TRACT
  Filled 2011-09-11: qty 1

## 2011-09-11 MED ORDER — DIPHENHYDRAMINE HCL 50 MG/ML IJ SOLN: 25.0000 mg | Freq: Once | INTRAMUSCULAR | Status: DC

## 2011-09-11 NOTE — ED Notes (Signed)
MD at bedside. 

## 2011-09-11 NOTE — ED Provider Notes (Signed)
Scribed for Benny Lennert, MD, the patient was seen in room APA02/[facility]02 . This chart was scribed by Ellie Lunch. This patient's care was started at 10:28 PM.   CSN: [id number] Arrival date & time: 09/11/2011 10:26 PM  Chief Complaint  Patient presents with  . Allergic Reaction    (Consider location/radiation/quality/duration/timing/severity/associated sxs/prior treatment) HPIPt seen at 22:29 Tammy Blair is a 60 y.o. female who presents to the Emergency Department complaining of an allergic reaction after being stung by a yellow jacket inside her mouth about 6 hours ago. Pt states the yellow jacket was in her drink straw, she sipped it into her mouth and spit it out. In that time she believes she was stung. Pt reports having some pain, swelling in her mouth and throat, and difficulty swallowing after the sting. Sx have been constant since onset. No hx of allergic rxn to bee sting.  Pt uses O2 supplementation at home and albuterol inhaler  Past Medical History  Diagnosis Date  . Asthma   . COPD (chronic obstructive pulmonary disease)     Past Surgical History  Procedure Date  . Cesarean section     No family history on file.  History  Substance Use Topics  . Smoking status: Current Everyday Smoker  . Smokeless tobacco: Not on file  . Alcohol Use: No    Review of Systems  Constitutional: Negative for fatigue.  HENT: Positive for facial swelling (face and throat) and trouble swallowing. Negative for congestion, sinus pressure and ear discharge.   Eyes: Negative for discharge.  Respiratory: Negative for cough.   Cardiovascular: Negative for chest pain.  Gastrointestinal: Negative for abdominal pain and diarrhea.  Genitourinary: Negative for frequency and hematuria.  Musculoskeletal: Negative for back pain.  Skin: Negative for rash.  Neurological: Negative for seizures and headaches.  Hematological: Negative.   Psychiatric/Behavioral: Negative for hallucinations.     Allergies  Review of patient's allergies indicates no known allergies.  Home Medications  No current outpatient prescriptions on file.  BP 120/108  Pulse 94  Temp(Src) 98 F (36.7 C) (Oral)  Resp 18  Ht 5\' 9"  (1.753 m)  Wt 202 lb (91.627 kg)  BMI 29.83 kg/m2  SpO2 97%  Physical Exam  Nursing note and vitals reviewed. Constitutional: She is oriented to person, place, and time. She appears well-developed.  HENT:  Head: Normocephalic and atraumatic.       Swollen uvula  Eyes: Conjunctivae and EOM are normal. No scleral icterus.  Neck: Neck supple. No thyromegaly present.  Cardiovascular: Normal rate and regular rhythm.  Exam reveals no gallop and no friction rub.   No murmur heard. Pulmonary/Chest: No stridor. She has wheezes (slight). She has no rales. She exhibits no tenderness.  Abdominal: She exhibits no distension. There is no tenderness. There is no rebound.  Musculoskeletal: Normal range of motion. She exhibits no edema.  Lymphadenopathy:    She has no cervical adenopathy.  Neurological: She is oriented to person, place, and time. Coordination normal.  Skin: No rash noted. No erythema.  Psychiatric: She has a normal mood and affect. Her behavior is normal.   Procedures   OTHER DATA REVIEWED: Nursing notes, vital signs, and past medical records reviewed.  DIAGNOSTIC STUDIES: Oxygen Saturation is 97% on 2 liters/min via Patient connected to nasal cannula oxygen, normal by my interpretation.    LABS / RADIOLOGY:   Labs Reviewed  CBC  DIFFERENTIAL  BASIC METABOLIC PANEL   ED COURSE / COORDINATION OF CARE: Allergic  reaction The chart was scribed for me under my direct supervision.  I personally performed the history, physical, and medical decision making and all procedures in the evaluation of this patient.Benny Lennert, MD 09/11/11 2350

## 2011-09-11 NOTE — ED Notes (Signed)
Pt just received a phone call from family telling her that her nephew shot and killed himself. Pt very upset, husband found and brought to the room. Pt requesting a medication to help calm herself down. Dr zammit notified.

## 2011-09-11 NOTE — ED Notes (Signed)
Pt reports she got stung in her mouth by a yellow jacket approx 6 hs ago, pt reports she went to sleep and woke up with diff swallowing

## 2011-09-11 NOTE — ED Notes (Signed)
Pt self ambulated to rest room and back with a steady gait

## 2011-09-12 MED ORDER — PREDNISONE 10 MG PO TABS: 20.0000 mg | ORAL_TABLET | Freq: Every day | ORAL | Status: AC

## 2011-09-12 NOTE — ED Notes (Signed)
Pt left the er stating no needs 

## 2011-09-12 NOTE — ED Notes (Signed)
Pt requesting to go home, no noted distress after self ambulating to rest room and back. Dr strand notified.

## 2011-09-12 NOTE — Progress Notes (Signed)
2354 Assumed care/disposition of patient. Patient may have swallowed a bee/wasp that was in a canned drink. Developed soreness to the back of her throat, mild swelling of uvula. No difficulty swallowing or breathing. Was given benedryl, solumedrol and pepcid. Intent was to watch in the ER for several hours. She received a phone call from her sister advising that her nephew had just shot himself. She is emotional upset and asking to be allowed to go home. VSS. Posterior pharynx is clear. Continued mild swelling of the uvula. No airway compromise, no stridor. Will allow discharge of patient home.

## 2012-04-13 ENCOUNTER — Emergency Department (INDEPENDENT_AMBULATORY_CARE_PROVIDER_SITE_OTHER): Payer: Medicaid Other

## 2012-04-13 ENCOUNTER — Encounter (HOSPITAL_BASED_OUTPATIENT_CLINIC_OR_DEPARTMENT_OTHER): Payer: Self-pay | Admitting: *Deleted

## 2012-04-13 ENCOUNTER — Emergency Department (HOSPITAL_BASED_OUTPATIENT_CLINIC_OR_DEPARTMENT_OTHER)
Admission: EM | Admit: 2012-04-13 | Discharge: 2012-04-13 | Disposition: A | Discharge status: Home / Self Care | Payer: Medicaid Other | Attending: Emergency Medicine | Admitting: Emergency Medicine

## 2012-04-13 DIAGNOSIS — R0602 Shortness of breath: Secondary | ICD-10-CM | POA: Insufficient documentation

## 2012-04-13 DIAGNOSIS — J449 Chronic obstructive pulmonary disease, unspecified: Secondary | ICD-10-CM

## 2012-04-13 DIAGNOSIS — R918 Other nonspecific abnormal finding of lung field: Secondary | ICD-10-CM

## 2012-04-13 DIAGNOSIS — R0902 Hypoxemia: Secondary | ICD-10-CM

## 2012-04-13 DIAGNOSIS — J4489 Other specified chronic obstructive pulmonary disease: Secondary | ICD-10-CM | POA: Insufficient documentation

## 2012-04-13 DIAGNOSIS — J40 Bronchitis, not specified as acute or chronic: Secondary | ICD-10-CM

## 2012-04-13 DIAGNOSIS — F17200 Nicotine dependence, unspecified, uncomplicated: Secondary | ICD-10-CM | POA: Insufficient documentation

## 2012-04-13 IMAGING — CR DG CHEST 1V PORT
1 series · 1 of 1 positions shown · non-contrast
Comparison: Two-view chest x-ray [DATE], [DATE] and
[DATE] [HOSPITAL].

CLINICAL DATA: Shortness of breath.  Hypoxemia.  History of oxygen
dependent COPD.

PORTABLE CHEST - 1 VIEW [DATE]/[PHONE_NUMBER] hours:

[view not recorded]
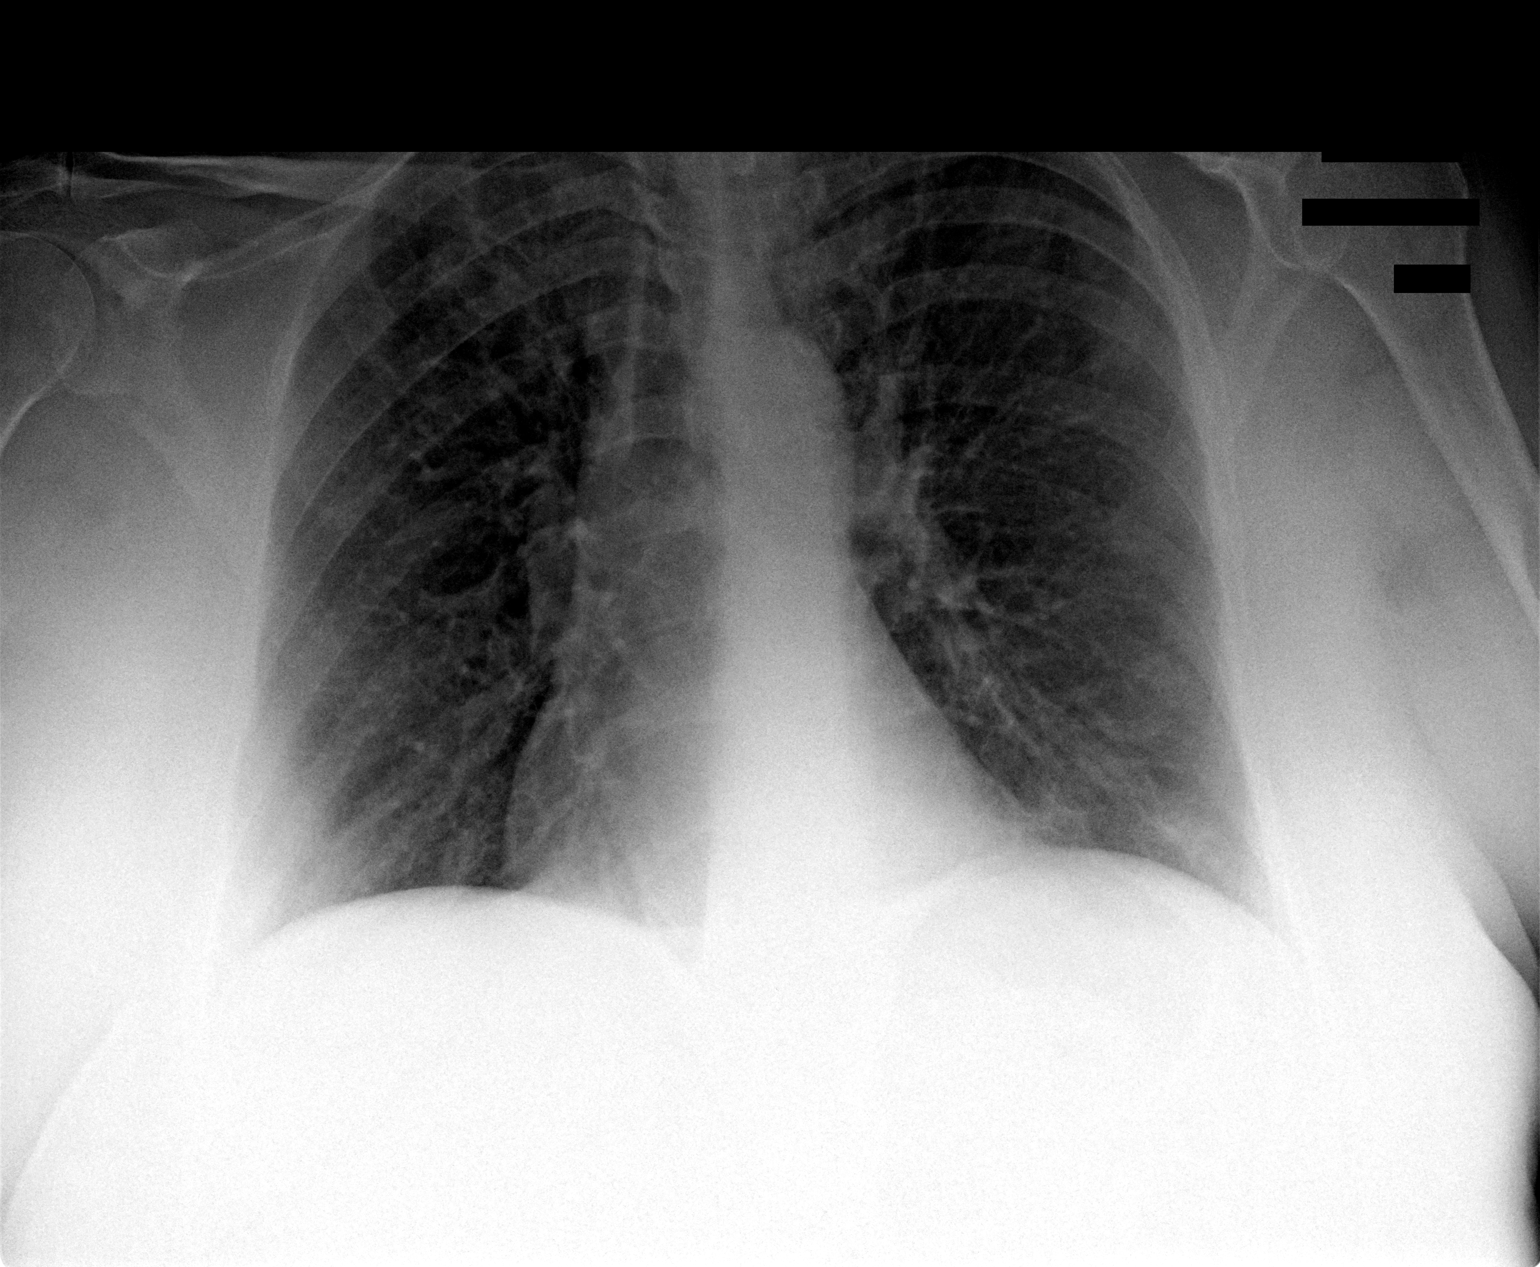

[1 of 1 positions shown; findings below may reference images not displayed]

FINDINGS: Cardiac silhouette upper normal in size for the AP
portable technique, unchanged.  Prominent bronchovascular markings
diffusely and central peribronchial thickening, more so than on the
prior examinations.  Focal patchy opacity at the left lung base.
Lungs otherwise clear.
IMPRESSION: Focal bronchopneumonia suspected at the left lung base,
superimposed upon moderate changes of acute bronchitis and/or
asthma.

## 2012-04-13 MED ORDER — IPRATROPIUM BROMIDE 0.02 % IN SOLN
0.5000 mg | Freq: Once | RESPIRATORY_TRACT | Status: AC
  Administered 2012-04-13: 0.5 mg via RESPIRATORY_TRACT
  Filled 2012-04-13: qty 2.5

## 2012-04-13 MED ORDER — ALBUTEROL SULFATE (5 MG/ML) 0.5% IN NEBU
5.0000 mg | INHALATION_SOLUTION | Freq: Once | RESPIRATORY_TRACT | Status: AC
  Administered 2012-04-13: 5 mg via RESPIRATORY_TRACT
  Filled 2012-04-13: qty 1

## 2012-04-13 MED ORDER — PREDNISONE 10 MG PO TABS: 20.0000 mg | ORAL_TABLET | Freq: Two times a day (BID) | ORAL | Status: DC

## 2012-04-13 MED ORDER — AZITHROMYCIN 250 MG PO TABS: 250.0000 mg | ORAL_TABLET | Freq: Every day | ORAL | Status: AC

## 2012-04-13 NOTE — Discharge Instructions (Signed)

## 2012-04-13 NOTE — ED Notes (Signed)
Pt sent here by PCP for low sa02. Pt denies SOB or difficulty breathing. Sa02 96% on RA

## 2012-04-13 NOTE — ED Provider Notes (Signed)
History     CSN: [id number]  Arrival date & time 04/13/12  1355   First MD Initiated Contact with Patient 04/13/12 1402      Chief Complaint  Patient presents with  . Shortness of Breath    (Consider location/radiation/quality/duration/timing/severity/associated sxs/prior treatment) HPI Comments: Was sent from pcp office for eval of low oxygen sats.  Patient was seeing Dr. Lovell Sheehan for a routine appointment.  She has a history of COPD and is on home oxygen at night and nebs.  She denies feeling sob, cough, chest pain, or diaphoresis.  She received a steroid injection at office prior to coming here.  She feels fine and stopped by [employer] on the way here for lunch without any difficulty.    The history is provided by the patient.    Past Medical History  Diagnosis Date  . Asthma   . COPD (chronic obstructive pulmonary disease)     Past Surgical History  Procedure Date  . Cesarean section     No family history on file.  History  Substance Use Topics  . Smoking status: Current Everyday Smoker -- 0.5 packs/day    Types: Cigarettes  . Smokeless tobacco: Not on file  . Alcohol Use: No    OB History    Grav Para Term Preterm Abortions TAB SAB Ect Mult Living                  Review of Systems  All other systems reviewed and are negative.    Allergies  Review of patient's allergies indicates no known allergies.  Home Medications   Current Outpatient Rx  Name Route Sig Dispense Refill  . ALBUTEROL SULFATE (2.5 MG/3ML) 0.083% IN NEBU Nebulization Take 2.5 mg by nebulization every 6 (six) hours as needed.    . ALPRAZOLAM 1 MG PO TABS Oral Take 1 mg by mouth at bedtime as needed.    . BUDESONIDE-FORMOTEROL FUMARATE 160-4.5 MCG/ACT IN AERO Inhalation Inhale 2 puffs into the lungs 2 (two) times daily.    Marland Kitchen METOPROLOL TARTRATE 50 MG PO TABS Oral Take 50 mg by mouth 2 (two) times daily.    . OXYCODONE HCL 30 MG PO TABS Oral Take 30 mg by mouth every 4 (four) hours as  needed.      BP 169/83  Pulse 93  Temp(Src) 98.1 F (36.7 C) (Oral)  Resp 18  Ht 5\' 9"  (1.753 m)  Wt 195 lb (88.451 kg)  BMI 28.80 kg/m2  SpO2 98%  Physical Exam  Nursing note and vitals reviewed. Constitutional: She is oriented to person, place, and time. She appears well-developed and well-nourished. No distress.  HENT:  Head: Normocephalic and atraumatic.  Neck: Normal range of motion. Neck supple.  Cardiovascular: Normal rate and regular rhythm.  Exam reveals no gallop and no friction rub.   No murmur heard. Pulmonary/Chest: Effort normal. No respiratory distress. She has wheezes.       Scattered rhonchi bilaterally.    Abdominal: Soft. Bowel sounds are normal. She exhibits no distension. There is no tenderness.  Musculoskeletal: Normal range of motion.  Neurological: She is alert and oriented to person, place, and time.  Skin: Skin is warm and dry. She is not diaphoretic.    ED Course  Procedures (including critical care time)  Labs Reviewed - No data to display No results found.   No diagnosis found.    MDM  The patient arrived with oxygen saturations of 96% with a good waveform.  She is  in no distress and does not feel more short of breath than normal.  She was actually quite surprised when she was instructed to come here.  She stopped by [employer] for lunch before coming here and had no problems.  She was given a neb here and her chest xray showed a subtle suspected bronchopneumonia.  Her saturations remain very stable and she wants to return home.  I feel this appropriate.  She will be prescribed Zmax and prednisone and discharged.  To return prn if she worsens.        Geoffery Lyons, MD 04/13/12 1444

## 2012-09-25 ENCOUNTER — Emergency Department (HOSPITAL_COMMUNITY): Payer: Medicaid Other

## 2012-09-25 ENCOUNTER — Emergency Department (HOSPITAL_COMMUNITY)
Admission: EM | Admit: 2012-09-25 | Discharge: 2012-09-25 | Disposition: A | Discharge status: Home / Self Care | Payer: Medicaid Other | Attending: Emergency Medicine | Admitting: Emergency Medicine

## 2012-09-25 ENCOUNTER — Encounter (HOSPITAL_COMMUNITY): Payer: Self-pay

## 2012-09-25 DIAGNOSIS — F17200 Nicotine dependence, unspecified, uncomplicated: Secondary | ICD-10-CM | POA: Insufficient documentation

## 2012-09-25 DIAGNOSIS — R0609 Other forms of dyspnea: Secondary | ICD-10-CM | POA: Insufficient documentation

## 2012-09-25 DIAGNOSIS — R05 Cough: Secondary | ICD-10-CM | POA: Insufficient documentation

## 2012-09-25 DIAGNOSIS — R0989 Other specified symptoms and signs involving the circulatory and respiratory systems: Secondary | ICD-10-CM | POA: Insufficient documentation

## 2012-09-25 DIAGNOSIS — R0602 Shortness of breath: Secondary | ICD-10-CM | POA: Insufficient documentation

## 2012-09-25 DIAGNOSIS — R059 Cough, unspecified: Secondary | ICD-10-CM | POA: Insufficient documentation

## 2012-09-25 DIAGNOSIS — J4489 Other specified chronic obstructive pulmonary disease: Secondary | ICD-10-CM | POA: Insufficient documentation

## 2012-09-25 DIAGNOSIS — J40 Bronchitis, not specified as acute or chronic: Secondary | ICD-10-CM | POA: Insufficient documentation

## 2012-09-25 DIAGNOSIS — IMO0001 Reserved for inherently not codable concepts without codable children: Secondary | ICD-10-CM | POA: Insufficient documentation

## 2012-09-25 DIAGNOSIS — J9801 Acute bronchospasm: Secondary | ICD-10-CM

## 2012-09-25 DIAGNOSIS — J449 Chronic obstructive pulmonary disease, unspecified: Secondary | ICD-10-CM | POA: Insufficient documentation

## 2012-09-25 LAB — CBC WITH DIFFERENTIAL/PLATELET
Basophils Absolute: 0.1 10*3/uL (ref 0.0–0.1)
Eosinophils Absolute: 0.8 10*3/uL — ABNORMAL HIGH (ref 0.0–0.7)
Lymphs Abs: 2.3 10*3/uL (ref 0.7–4.0)
MCHC: 33.3 g/dL (ref 30.0–36.0)
MCV: 94 fL (ref 78.0–100.0)
Monocytes Relative: 13 % — ABNORMAL HIGH (ref 3–12)
Platelets: 282 10*3/uL (ref 150–400)
RDW: 13.2 % (ref 11.5–15.5)
WBC: 12 10*3/uL — ABNORMAL HIGH (ref 4.0–10.5)

## 2012-09-25 LAB — BASIC METABOLIC PANEL
BUN: 8 mg/dL (ref 6–23)
Calcium: 9.4 mg/dL (ref 8.4–10.5)
Creatinine, Ser: 0.79 mg/dL (ref 0.50–1.10)
GFR calc Af Amer: >90 mL/min (ref >90)
GFR calc non Af Amer: 89 mL/min — ABNORMAL LOW (ref >90)
Potassium: 4.1 mEq/L (ref 3.5–5.1)

## 2012-09-25 IMAGING — CR DG CHEST 2V
2 series · 2 of 2 positions shown · non-contrast
Comparison: [DATE] and prior chest radiographs

CLINICAL DATA: 61-year-old female with shortness of breath, cough
and congestion.

CHEST - 2 VIEW

[view not recorded (1 of 2)]
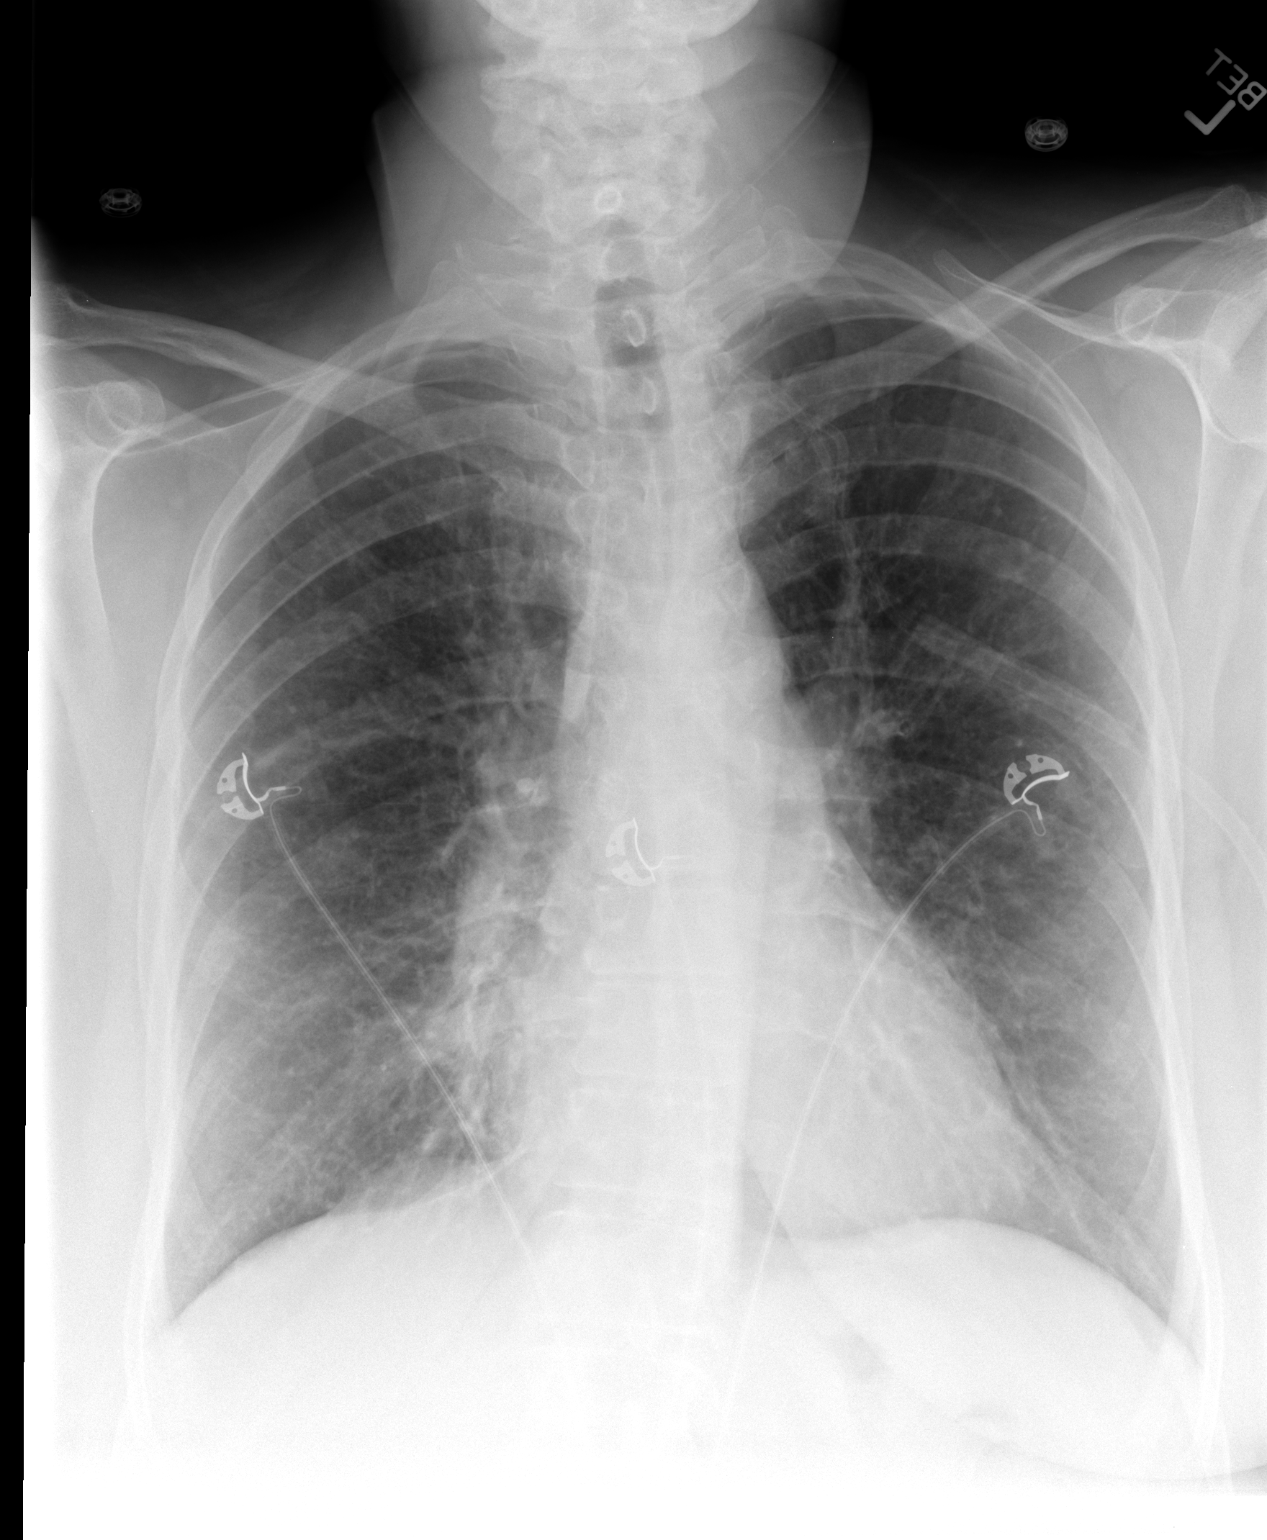

[view not recorded (2 of 2)]
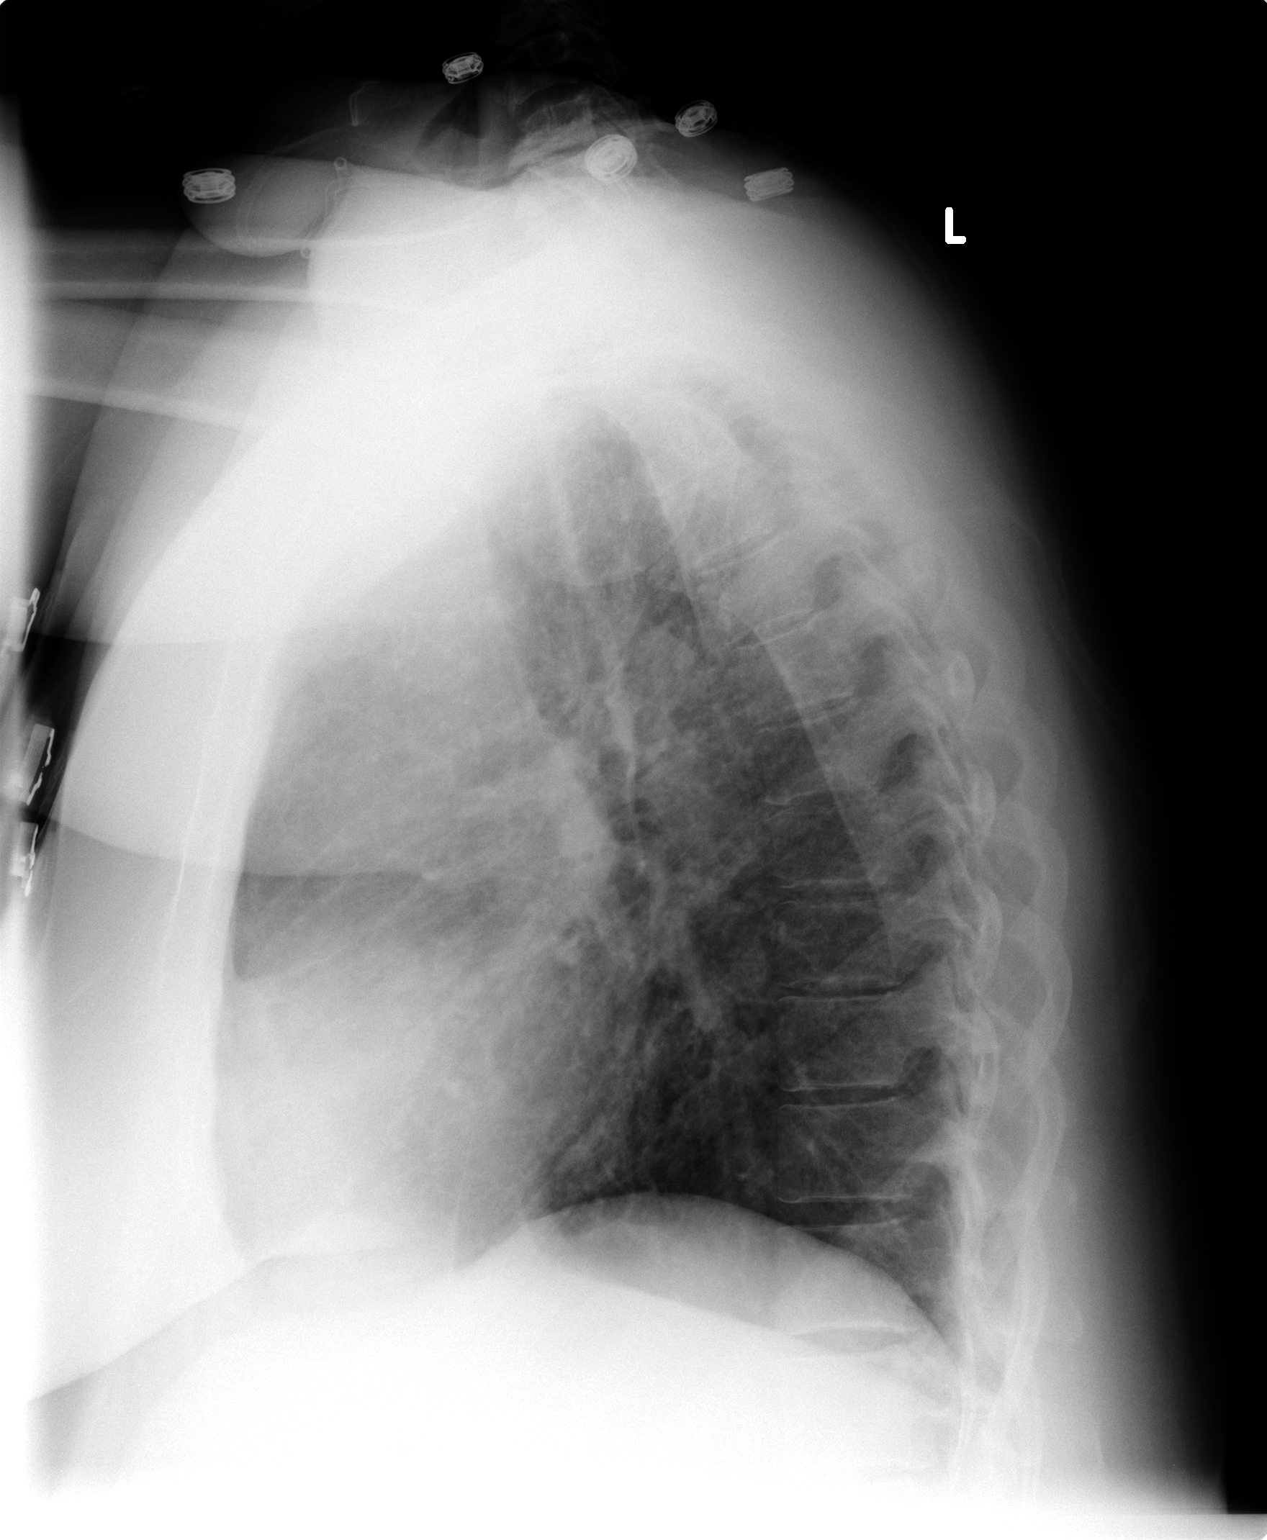

[2 of 2 positions shown; findings below may reference images not displayed]

FINDINGS: The cardiomediastinal silhouette is unremarkable.
Mild peribronchial thickening again noted.
There is no evidence of focal airspace disease, pulmonary edema,
suspicious pulmonary nodule/mass, pleural effusion, or
pneumothorax.
No acute bony abnormalities are identified.
IMPRESSION: No evidence of acute cardiopulmonary disease.

Chronic peribronchial thickening.

## 2012-09-25 MED ORDER — METHYLPREDNISOLONE SODIUM SUCC 125 MG IJ SOLR
125.0000 mg | Freq: Once | INTRAMUSCULAR | Status: AC
  Administered 2012-09-25: 125 mg via INTRAVENOUS
  Filled 2012-09-25 (×2): qty 2

## 2012-09-25 MED ORDER — AZITHROMYCIN 250 MG PO TABS: ORAL_TABLET | ORAL | Status: DC

## 2012-09-25 MED ORDER — ALBUTEROL SULFATE (5 MG/ML) 0.5% IN NEBU
5.0000 mg | INHALATION_SOLUTION | Freq: Once | RESPIRATORY_TRACT | Status: AC
  Administered 2012-09-25: 5 mg via RESPIRATORY_TRACT
  Filled 2012-09-25: qty 1

## 2012-09-25 MED ORDER — PREDNISONE 10 MG PO TABS: 20.0000 mg | ORAL_TABLET | Freq: Every day | ORAL | Status: DC

## 2012-09-25 MED ORDER — IPRATROPIUM BROMIDE 0.02 % IN SOLN
0.5000 mg | Freq: Once | RESPIRATORY_TRACT | Status: AC
  Administered 2012-09-25: 0.5 mg via RESPIRATORY_TRACT
  Filled 2012-09-25: qty 2.5

## 2012-09-25 NOTE — ED Notes (Signed)
Pt c/o hurting all over, nonproductive cough, and SOB x 3 days.

## 2012-09-25 NOTE — ED Provider Notes (Signed)
History   This chart was scribed for Tammy Lennert, MD by Gerlean Ren. This patient was seen in room APA09/APA09 and the patient's care was started at 16:16.   CSN: [id number]  Arrival date & time 09/25/12  1551   First MD Initiated Contact with Patient 09/25/12 1613      Chief Complaint  Patient presents with  . Shortness of Breath    (Consider location/radiation/quality/duration/timing/severity/associated sxs/prior treatment) The history is provided by the patient. No language interpreter was used.   Tammy Blair is a 61 y.o. female with h/o COPD and asthma who presents to the Emergency Department complaining of 3 days of dyspnea with associated non-productive cough and myalgias.  Pt denies fever, sore throat, visual disturbance, CP, nausea, emesis, diarrhea, urinary symptoms, HA, weakness, numbness and rash as associated symptoms.  Pt is on 3L O2 at home via [place] at all times.  Pt is a current everyday smoker but denies alcohol use.   Past Medical History  Diagnosis Date  . Asthma   . COPD (chronic obstructive pulmonary disease)   . Back pain     Past Surgical History  Procedure Date  . Cesarean section   . Tonsillectomy     No family history on file.  History  Substance Use Topics  . Smoking status: Current Every Day Smoker -- 0.5 packs/day    Types: Cigarettes  . Smokeless tobacco: Not on file  . Alcohol Use: No    No OB history provided.  Review of Systems  Constitutional: Negative for fatigue.  HENT: Negative for congestion, sinus pressure and ear discharge.   Eyes: Negative for discharge.  Respiratory: Positive for cough and shortness of breath.   Cardiovascular: Negative for chest pain.  Gastrointestinal: Negative for abdominal pain and diarrhea.  Genitourinary: Negative for frequency and hematuria.  Musculoskeletal: Negative for back pain.  Skin: Negative for rash.  Neurological: Negative for seizures and headaches.  Hematological: Negative.     Psychiatric/Behavioral: Negative for hallucinations.    Allergies  Review of patient's allergies indicates no known allergies.  Home Medications   Current Outpatient Rx  Name Route Sig Dispense Refill  . ALBUTEROL SULFATE (2.5 MG/3ML) 0.083% IN NEBU Nebulization Take 2.5 mg by nebulization every 6 (six) hours as needed.    . ALPRAZOLAM 1 MG PO TABS Oral Take 1 mg by mouth at bedtime as needed.    . BUDESONIDE-FORMOTEROL FUMARATE 160-4.5 MCG/ACT IN AERO Inhalation Inhale 2 puffs into the lungs 2 (two) times daily.    Marland Kitchen METOPROLOL TARTRATE 50 MG PO TABS Oral Take 50 mg by mouth 2 (two) times daily.    . OXYCODONE HCL 30 MG PO TABS Oral Take 30 mg by mouth every 4 (four) hours as needed.    Marland Kitchen PREDNISONE 10 MG PO TABS Oral Take 2 tablets (20 mg total) by mouth 2 (two) times daily. 20 tablet 0    BP 114/92  Pulse 97  Temp 98.5 F (36.9 C) (Oral)  Resp 20  Ht 5\' 9"  (1.753 m)  Wt 203 lb (92.08 kg)  BMI 29.98 kg/m2  SpO2 93%  Physical Exam  Nursing note and vitals reviewed. Constitutional: She is oriented to person, place, and time. She appears well-developed.  HENT:  Head: Normocephalic and atraumatic.       Dry mucous membranes.  Eyes: Conjunctivae normal and EOM are normal. No scleral icterus.  Neck: Neck supple. No thyromegaly present.  Cardiovascular: Normal rate and regular rhythm.  Exam reveals  no gallop and no friction rub.   No murmur heard. Pulmonary/Chest: No stridor. She has wheezes. She has no rales. She exhibits no tenderness.       Wheezes in bilateral lung fields.  Abdominal: She exhibits no distension. There is no tenderness. There is no rebound.  Musculoskeletal: Normal range of motion. She exhibits no edema.  Lymphadenopathy:    She has no cervical adenopathy.  Neurological: She is oriented to person, place, and time. Coordination normal.  Skin: No rash noted. No erythema.  Psychiatric: She has a normal mood and affect. Her behavior is normal.    ED  Course  Procedures (including critical care time) DIAGNOSTIC STUDIES: Oxygen Saturation is 93% on [place], adequate by my interpretation.    COORDINATION OF CARE: 16:18- Patient informed of clinical course, understands medical decision-making process, and agrees with plan.  Ordered breathing treatment, IV solu-medrol, CBC, b-met, and chest XR.     Labs Reviewed  CBC WITH DIFFERENTIAL  BASIC METABOLIC PANEL   Results for orders placed during the hospital encounter of 09/25/12  CBC WITH DIFFERENTIAL      Component Value Range   WBC 12.0 (*) 4.0 - 10.5 K/uL   RBC 4.69  3.87 - 5.11 MIL/uL   Hemoglobin 14.7  12.0 - 15.0 g/dL   HCT 62.9  52.8 - 41.3 %   MCV 94.0  78.0 - 100.0 fL   MCH 31.3  26.0 - 34.0 pg   MCHC 33.3  30.0 - 36.0 g/dL   RDW 24.4  01.0 - 27.2 %   Platelets 282  150 - 400 K/uL   Neutrophils Relative 60  43 - 77 %   Lymphocytes Relative 19  12 - 46 %   Monocytes Relative 13 (*) 3 - 12 %   Eosinophils Relative 7 (*) 0 - 5 %   Basophils Relative 1  0 - 1 %   Neutro Abs 7.2  1.7 - 7.7 K/uL   Lymphs Abs 2.3  0.7 - 4.0 K/uL   Monocytes Absolute 1.6 (*) 0.1 - 1.0 K/uL   Eosinophils Absolute 0.8 (*) 0.0 - 0.7 K/uL   Basophils Absolute 0.1  0.0 - 0.1 K/uL   WBC Morphology ATYPICAL LYMPHOCYTES     Smear Review LARGE PLATELETS PRESENT    BASIC METABOLIC PANEL      Component Value Range   Sodium 137  135 - 145 mEq/L   Potassium 4.1  3.5 - 5.1 mEq/L   Chloride 98  96 - 112 mEq/L   CO2 31  19 - 32 mEq/L   Glucose, Bld 105 (*) 70 - 99 mg/dL   BUN 8  6 - 23 mg/dL   Creatinine, Ser 5.36  0.50 - 1.10 mg/dL   Calcium 9.4  8.4 - 64.4 mg/dL   GFR calc non Af Amer 89 (*) >90 mL/min   GFR calc Af Amer >90  >90 mL/min    Dg Chest 2 View  09/25/2012  *RADIOLOGY REPORT*  Clinical Data: 61 year old female with shortness of breath, cough and congestion.  CHEST - 2 VIEW  Comparison: 04/13/2012 and prior chest radiographs  Findings: The cardiomediastinal silhouette is unremarkable. Mild  peribronchial thickening again noted. There is no evidence of focal airspace disease, pulmonary edema, suspicious pulmonary nodule/mass, pleural effusion, or pneumothorax. No acute bony abnormalities are identified.  IMPRESSION: No evidence of acute cardiopulmonary disease.  Chronic peribronchial thickening.   Original Report Authenticated By: Rosendo Gros, M.D.      No  diagnosis found.    MDM   The chart was scribed for me under my direct supervision.  I personally performed the history, physical, and medical decision making and all procedures in the evaluation of this patient.. t}        Tammy Lennert, MD 09/26/12 [phone]

## 2012-11-29 ENCOUNTER — Ambulatory Visit (INDEPENDENT_AMBULATORY_CARE_PROVIDER_SITE_OTHER): Payer: Medicaid Other | Admitting: Otolaryngology

## 2012-11-29 DIAGNOSIS — H95129 Granulation of postmastoidectomy cavity, unspecified ear: Secondary | ICD-10-CM

## 2013-02-17 ENCOUNTER — Observation Stay (HOSPITAL_COMMUNITY)
Admission: EM | Admit: 2013-02-17 | Discharge: 2013-02-19 | Disposition: A | Discharge status: Home / Self Care | Payer: Medicaid Other | Attending: Internal Medicine | Admitting: Internal Medicine

## 2013-02-17 ENCOUNTER — Emergency Department (HOSPITAL_COMMUNITY): Payer: Medicaid Other

## 2013-02-17 ENCOUNTER — Encounter (HOSPITAL_COMMUNITY): Payer: Self-pay

## 2013-02-17 DIAGNOSIS — J441 Chronic obstructive pulmonary disease with (acute) exacerbation: Principal | ICD-10-CM | POA: Insufficient documentation

## 2013-02-17 DIAGNOSIS — F17200 Nicotine dependence, unspecified, uncomplicated: Secondary | ICD-10-CM | POA: Insufficient documentation

## 2013-02-17 DIAGNOSIS — G894 Chronic pain syndrome: Secondary | ICD-10-CM | POA: Insufficient documentation

## 2013-02-17 DIAGNOSIS — I10 Essential (primary) hypertension: Secondary | ICD-10-CM | POA: Insufficient documentation

## 2013-02-17 DIAGNOSIS — E669 Obesity, unspecified: Secondary | ICD-10-CM | POA: Insufficient documentation

## 2013-02-17 DIAGNOSIS — R0602 Shortness of breath: Secondary | ICD-10-CM | POA: Insufficient documentation

## 2013-02-17 DIAGNOSIS — J9610 Chronic respiratory failure, unspecified whether with hypoxia or hypercapnia: Secondary | ICD-10-CM | POA: Insufficient documentation

## 2013-02-17 DIAGNOSIS — F411 Generalized anxiety disorder: Secondary | ICD-10-CM | POA: Diagnosis present

## 2013-02-17 DIAGNOSIS — Z23 Encounter for immunization: Secondary | ICD-10-CM | POA: Insufficient documentation

## 2013-02-17 LAB — BLOOD GAS, ARTERIAL
Acid-Base Excess: 3.9 mmol/L — ABNORMAL HIGH (ref 0.0–2.0)
O2 Content: 3 L/min
O2 Saturation: 96.3 %
pO2, Arterial: 84.8 mmHg (ref 80.0–100.0)

## 2013-02-17 IMAGING — CR DG CHEST 2V
2 series · 2 of 2 positions shown · non-contrast
Comparison: [DATE]

CLINICAL DATA: Shortness of breath, cough, congestion, fever

CHEST - 2 VIEW

[view not recorded (1 of 2)]
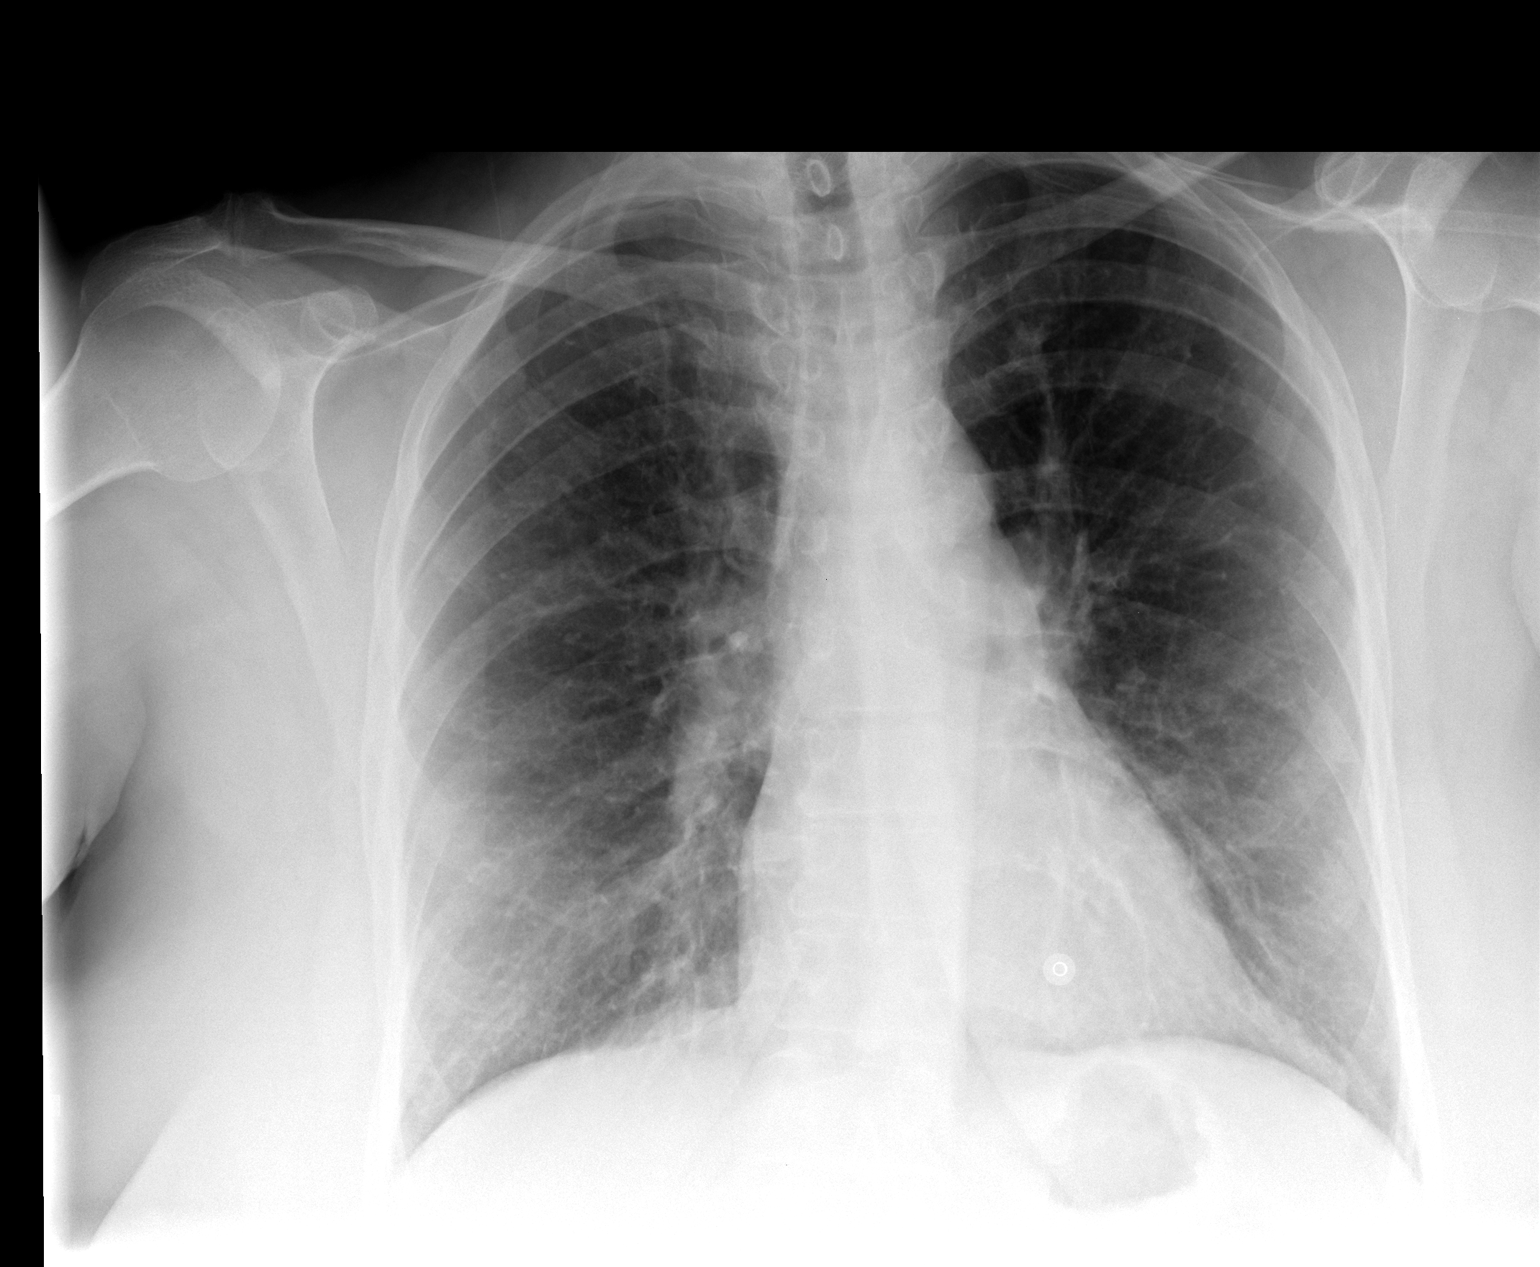

[view not recorded (2 of 2)]
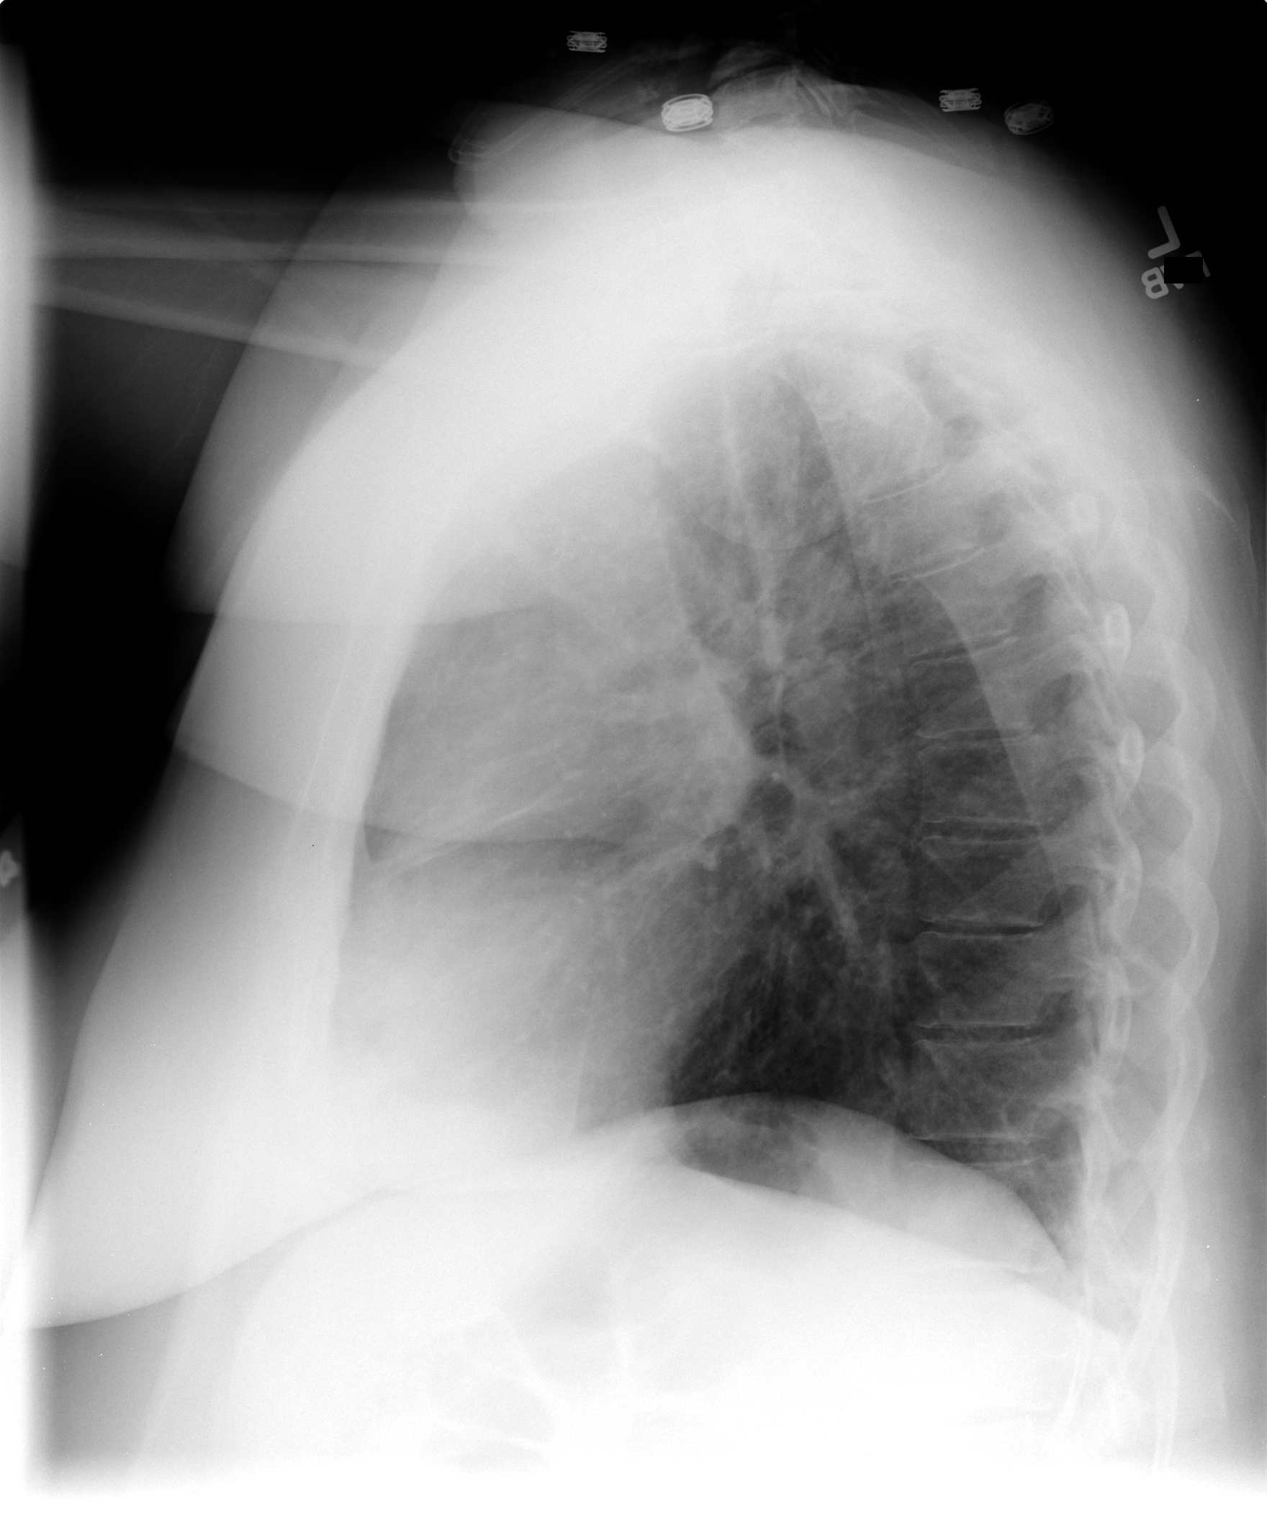

[2 of 2 positions shown; findings below may reference images not displayed]

FINDINGS: Normal heart size, mediastinal contours, and pulmonary vascularity.
Lungs clear.
No pleural effusion or pneumothorax.
Bones unremarkable.
IMPRESSION: No acute abnormalities.

## 2013-02-17 MED ORDER — ONDANSETRON HCL 4 MG/2ML IJ SOLN
4.0000 mg | Freq: Once | INTRAMUSCULAR | Status: AC
  Administered 2013-02-17: 4 mg via INTRAVENOUS
  Filled 2013-02-17: qty 2

## 2013-02-17 MED ORDER — ALBUTEROL SULFATE (5 MG/ML) 0.5% IN NEBU
5.0000 mg | INHALATION_SOLUTION | Freq: Once | RESPIRATORY_TRACT | Status: AC
  Administered 2013-02-18: 5 mg via RESPIRATORY_TRACT
  Filled 2013-02-17: qty 1

## 2013-02-17 MED ORDER — ALBUTEROL SULFATE (5 MG/ML) 0.5% IN NEBU
INHALATION_SOLUTION | RESPIRATORY_TRACT | Status: AC
  Administered 2013-02-17: 5 mg
  Filled 2013-02-17: qty 1

## 2013-02-17 MED ORDER — IPRATROPIUM BROMIDE 0.02 % IN SOLN
RESPIRATORY_TRACT | Status: AC
  Filled 2013-02-17: qty 2.5

## 2013-02-17 MED ORDER — ALBUTEROL SULFATE (5 MG/ML) 0.5% IN NEBU: 2.5000 mg | INHALATION_SOLUTION | Freq: Once | RESPIRATORY_TRACT | Status: DC

## 2013-02-17 MED ORDER — FENTANYL CITRATE 0.05 MG/ML IJ SOLN
50.0000 ug | Freq: Once | INTRAMUSCULAR | Status: AC
  Administered 2013-02-17: 50 ug via INTRAVENOUS
  Filled 2013-02-17: qty 2

## 2013-02-17 MED ORDER — IPRATROPIUM BROMIDE 0.02 % IN SOLN
0.5000 mg | Freq: Once | RESPIRATORY_TRACT | Status: AC
  Administered 2013-02-17: 0.5 mg via RESPIRATORY_TRACT

## 2013-02-17 MED ORDER — PREDNISONE 50 MG PO TABS
60.0000 mg | ORAL_TABLET | Freq: Once | ORAL | Status: AC
  Administered 2013-02-17: 60 mg via ORAL
  Filled 2013-02-17: qty 1

## 2013-02-17 MED ORDER — DIPHENHYDRAMINE HCL 50 MG/ML IJ SOLN
12.5000 mg | Freq: Once | INTRAMUSCULAR | Status: AC
  Administered 2013-02-17: 12.5 mg via INTRAVENOUS
  Filled 2013-02-17: qty 1

## 2013-02-17 NOTE — ED Notes (Signed)
Pt c/o SOB x3-4 days. Pt has been using O2 and nebulizer at home without relief. Pt also report productive cough. Pt describes sputum as clear.

## 2013-02-17 NOTE — ED Notes (Signed)
Short of breath, my oxygen doesn't help per pt. Itching all over per pt.

## 2013-02-17 NOTE — Progress Notes (Addendum)
Pt still smoking, uses 3 lpm/[place] at home all the time

## 2013-02-17 NOTE — ED Provider Notes (Signed)
History    This chart was scribed for [name], MD by Sofie Rower, ED Scribe. The patient was seen in room APA06/APA06 and the patient's care was started at 11:02PM.    CSN: [id number]  Arrival date & time 02/17/13  2203   First MD Initiated Contact with Patient 02/17/13 2302      Chief Complaint  Patient presents with  . Shortness of Breath    (Consider location/radiation/quality/duration/timing/severity/associated sxs/prior treatment) Patient is a 62 y.o. female presenting with shortness of breath. The history is provided by the patient. No language interpreter was used.  Shortness of Breath Severity:  Moderate Onset quality:  Sudden Duration:  1 day Timing:  Constant Progression:  Worsening Chronicity:  Recurrent Relieved by:  Oxygen Worsened by:  Activity Associated symptoms: chest pain and cough     Tammy Blair is a 62 y.o. female , with a hx of COPD, asthma, and back pain, who presents to the Emergency Department complaining of sudden, progressively worsening, shortness of breath, onset today (02/17/13) .  Associated symptoms include productive yellow cough, diffuse chest pain, and diffuse itching across the entire body. The pt reports she has a hx of COPD which she believes she may have currently exacerbated. The pt informs she is unaware of what may have triggered her COPD exacerbation, however, she does recall she purchased her daughter a new brand of laundry detergent earlier this week. The pt reports she breaths 3L of oxygen at home at baseline.   The pt is a current everyday smoker, however, she does not drink alcohol.   PCP is Dr. Lovell Sheehan.     Past Medical History  Diagnosis Date  . Asthma   . COPD (chronic obstructive pulmonary disease)   . Back pain     Past Surgical History  Procedure Laterality Date  . Cesarean section    . Tonsillectomy      No family history on file.  History  Substance Use Topics  . Smoking status: Current Every Day Smoker --  0.50 packs/day    Types: Cigarettes  . Smokeless tobacco: Not on file  . Alcohol Use: No    OB History   Grav Para Term Preterm Abortions TAB SAB Ect Mult Living                  Review of Systems  Respiratory: Positive for cough and shortness of breath.   Cardiovascular: Positive for chest pain.  All other systems reviewed and are negative.    Allergies  Review of patient's allergies indicates no known allergies.  Home Medications   Current Outpatient Rx  Name  Route  Sig  Dispense  Refill  . albuterol (PROAIR HFA) 108 (90 BASE) MCG/ACT inhaler   Inhalation   Inhale 2 puffs into the lungs 4 (four) times daily as needed. For shortness of breath         . albuterol (PROVENTIL) (2.5 MG/3ML) 0.083% nebulizer solution   Nebulization   Take 2.5 mg by nebulization every 4 (four) hours as needed. For shortness of breath         . ALPRAZolam (XANAX) 1 MG tablet   Oral   Take 1 mg by mouth 4 (four) times daily as needed. For anxiety         . budesonide-formoterol (SYMBICORT) 160-4.5 MCG/ACT inhaler   Inhalation   Inhale 1 puff into the lungs 2 (two) times daily.          . cyclobenzaprine (FLEXERIL)  10 MG tablet   Oral   Take 10 mg by mouth 2 (two) times daily as needed. For muscle spasm         . oxycodone (ROXICODONE) 30 MG immediate release tablet   Oral   Take 30 mg by mouth every 6 (six) hours as needed. For severe pain         . PRESCRIPTION MEDICATION   Oral   Take 1 tablet by mouth 2 (two) times daily. For Heartburn. NAME IS UNKNOWN           BP 163/82  Pulse 84  Temp(Src) 97.6 F (36.4 C) (Oral)  Resp 20  Ht 5\' 9"  (1.753 m)  Wt 215 lb (97.523 kg)  BMI 31.74 kg/m2  SpO2 96%  Physical Exam  Nursing note and vitals reviewed. Constitutional: She is oriented to person, place, and time. She appears well-developed and well-nourished. No distress.  HENT:  Head: Normocephalic and atraumatic.  No oral swelling.   Eyes: EOM are normal.  Pupils are equal, round, and reactive to light.  Neck: Neck supple. No tracheal deviation present.  Cardiovascular: Normal rate.   Pulmonary/Chest: Effort normal. No respiratory distress. She has wheezes.  Lung sounds decreased bilaterally. Mild tachypnea. No stridor.   Abdominal: Soft. She exhibits no distension.  Musculoskeletal: Normal range of motion. She exhibits no edema.  Calves soft, non tender, no chords.   Neurological: She is alert and oriented to person, place, and time. No sensory deficit.  Skin: Skin is warm and dry.  Psychiatric: She has a normal mood and affect. Her behavior is normal.    ED Course  Procedures (including critical care time)  DIAGNOSTIC STUDIES: Oxygen Saturation is 96% on [place], normal by my interpretation.    COORDINATION OF CARE:   11:21 PM- Treatment plan concerning application of breathing treatment discussed with patient. Pt agrees with treatment.   Results for orders placed during the hospital encounter of 02/17/13  BLOOD GAS, ARTERIAL      Result Value Range   O2 Content 3.0     Delivery systems NASAL CANNULA     pH, Arterial 7.358  7.350 - 7.450   pCO2 arterial 52.9 (*) 35.0 - 45.0 mmHg   pO2, Arterial 84.8  80.0 - 100.0 mmHg   Bicarbonate 29.0 (*) 20.0 - 24.0 mEq/L   TCO2 25.8  0 - 100 mmol/L   Acid-Base Excess 3.9 (*) 0.0 - 2.0 mmol/L   O2 Saturation 96.3     Patient temperature 37.0     Collection site RADIAL     Drawn by COLLECTED BY RT     Sample type ARTERIAL     Allens test (pass/fail) PASS  PASS  CBC      Result Value Range   WBC 8.9  4.0 - 10.5 K/uL   RBC 4.68  3.87 - 5.11 MIL/uL   Hemoglobin 14.6  12.0 - 15.0 g/dL   HCT 16.1  09.6 - 04.5 %   MCV 94.9  78.0 - 100.0 fL   MCH 31.2  26.0 - 34.0 pg   MCHC 32.9  30.0 - 36.0 g/dL   RDW 40.9  81.1 - 91.4 %   Platelets 284  150 - 400 K/uL  BASIC METABOLIC PANEL      Result Value Range   Sodium 140  135 - 145 mEq/L   Potassium 3.4 (*) 3.5 - 5.1 mEq/L   Chloride 100  96 -  112 mEq/L   CO2 32  19 - 32 mEq/L   Glucose, Bld 101 (*) 70 - 99 mg/dL   BUN 9  6 - 23 mg/dL   Creatinine, Ser 1.61  0.50 - 1.10 mg/dL   Calcium 9.5  8.4 - 09.6 mg/dL   GFR calc non Af Amer 72 (*) >90 mL/min   GFR calc Af Amer 83 (*) >90 mL/min  TROPONIN I      Result Value Range   Troponin I <0.30  <0.30 ng/mL   Dg Chest 2 View  02/18/2013  *RADIOLOGY REPORT*  Clinical Data: Shortness of breath, cough, congestion, fever  CHEST - 2 VIEW  Comparison: 09/25/2012  Findings: Normal heart size, mediastinal contours, and pulmonary vascularity. Lungs clear. No pleural effusion or pneumothorax. Bones unremarkable.  IMPRESSION: No acute abnormalities.   Original Report Authenticated By: Ulyses Southward, M.D.    Steroids, albuterol x 2 , still wheezing, ambulates 3L O2 without hypoxia but has sig Dyspne and persistent wheezing  MED consult - Dr Orvan Falconer agrees to admit MDM  SOB/ wheezing with h/o COPD not improved with medications in the ED. CXR and labs reviewed.   MED admit    I personally performed the services described in this documentation, which was scribed in my presence. The recorded information has been reviewed and is accurate.     [name], MD 02/18/13 [phone]

## 2013-02-18 ENCOUNTER — Encounter (HOSPITAL_COMMUNITY): Payer: Self-pay | Admitting: Internal Medicine

## 2013-02-18 DIAGNOSIS — J9610 Chronic respiratory failure, unspecified whether with hypoxia or hypercapnia: Secondary | ICD-10-CM | POA: Diagnosis present

## 2013-02-18 DIAGNOSIS — F17200 Nicotine dependence, unspecified, uncomplicated: Secondary | ICD-10-CM | POA: Diagnosis present

## 2013-02-18 DIAGNOSIS — J441 Chronic obstructive pulmonary disease with (acute) exacerbation: Secondary | ICD-10-CM | POA: Diagnosis present

## 2013-02-18 DIAGNOSIS — E669 Obesity, unspecified: Secondary | ICD-10-CM | POA: Diagnosis present

## 2013-02-18 DIAGNOSIS — F411 Generalized anxiety disorder: Secondary | ICD-10-CM | POA: Diagnosis present

## 2013-02-18 DIAGNOSIS — G894 Chronic pain syndrome: Secondary | ICD-10-CM | POA: Diagnosis present

## 2013-02-18 LAB — CBC
MCH: 31.2 pg (ref 26.0–34.0)
MCV: 94.9 fL (ref 78.0–100.0)
Platelets: 284 10*3/uL (ref 150–400)
RBC: 4.68 MIL/uL (ref 3.87–5.11)

## 2013-02-18 LAB — HEPATIC FUNCTION PANEL
ALT: 32 U/L (ref 0–35)
AST: 25 U/L (ref 0–37)
Alkaline Phosphatase: 73 U/L (ref 39–117)
Bilirubin, Direct: <0.1 mg/dL (ref 0.0–0.3)
Total Protein: 8.4 g/dL — ABNORMAL HIGH (ref 6.0–8.3)

## 2013-02-18 LAB — BASIC METABOLIC PANEL
CO2: 32 mEq/L (ref 19–32)
Calcium: 9.5 mg/dL (ref 8.4–10.5)
Creatinine, Ser: 0.86 mg/dL (ref 0.50–1.10)

## 2013-02-18 LAB — URINALYSIS, ROUTINE W REFLEX MICROSCOPIC
Glucose, UA: NEGATIVE mg/dL
Hgb urine dipstick: NEGATIVE
Ketones, ur: NEGATIVE mg/dL
Leukocytes, UA: NEGATIVE
pH: 6 (ref 5.0–8.0)

## 2013-02-18 LAB — TROPONIN I: Troponin I: <0.3 ng/mL (ref <0.30)

## 2013-02-18 MED ORDER — ENOXAPARIN SODIUM 40 MG/0.4ML ~~LOC~~ SOLN
40.0000 mg | SUBCUTANEOUS | Status: DC
  Administered 2013-02-18 – 2013-02-19 (×2): 40 mg via SUBCUTANEOUS
  Filled 2013-02-18 (×2): qty 0.4

## 2013-02-18 MED ORDER — ONDANSETRON HCL 4 MG/2ML IJ SOLN: 4.0000 mg | INTRAMUSCULAR | Status: DC | PRN

## 2013-02-18 MED ORDER — GUAIFENESIN ER 600 MG PO TB12
1200.0000 mg | ORAL_TABLET | Freq: Two times a day (BID) | ORAL | Status: DC
  Administered 2013-02-18 – 2013-02-19 (×3): 1200 mg via ORAL
  Filled 2013-02-18 (×3): qty 2

## 2013-02-18 MED ORDER — ALBUTEROL SULFATE (5 MG/ML) 0.5% IN NEBU: 2.5000 mg | INHALATION_SOLUTION | RESPIRATORY_TRACT | Status: DC | PRN

## 2013-02-18 MED ORDER — BUDESONIDE-FORMOTEROL FUMARATE 160-4.5 MCG/ACT IN AERO
1.0000 | INHALATION_SPRAY | Freq: Two times a day (BID) | RESPIRATORY_TRACT | Status: DC
  Administered 2013-02-18 – 2013-02-19 (×3): 1 via RESPIRATORY_TRACT
  Filled 2013-02-18: qty 6

## 2013-02-18 MED ORDER — INFLUENZA VIRUS VACC SPLIT PF IM SUSP
0.5000 mL | INTRAMUSCULAR | Status: AC
  Administered 2013-02-19: 0.5 mL via INTRAMUSCULAR
  Filled 2013-02-18: qty 0.5

## 2013-02-18 MED ORDER — HYDROMORPHONE HCL PF 1 MG/ML IJ SOLN: 0.5000 mg | INTRAMUSCULAR | Status: DC | PRN

## 2013-02-18 MED ORDER — ALPRAZOLAM 1 MG PO TABS
1.0000 mg | ORAL_TABLET | Freq: Four times a day (QID) | ORAL | Status: DC | PRN
  Administered 2013-02-18 – 2013-02-19 (×4): 1 mg via ORAL
  Filled 2013-02-18 (×4): qty 1

## 2013-02-18 MED ORDER — FLEET ENEMA 7-19 GM/118ML RE ENEM: 1.0000 | ENEMA | Freq: Once | RECTAL | Status: AC | PRN

## 2013-02-18 MED ORDER — ACETAMINOPHEN 325 MG PO TABS: 650.0000 mg | ORAL_TABLET | ORAL | Status: DC | PRN

## 2013-02-18 MED ORDER — PANTOPRAZOLE SODIUM 40 MG PO TBEC
40.0000 mg | DELAYED_RELEASE_TABLET | Freq: Two times a day (BID) | ORAL | Status: DC
  Administered 2013-02-18 – 2013-02-19 (×3): 40 mg via ORAL
  Filled 2013-02-18 (×3): qty 1

## 2013-02-18 MED ORDER — ALBUTEROL SULFATE (5 MG/ML) 0.5% IN NEBU
2.5000 mg | INHALATION_SOLUTION | RESPIRATORY_TRACT | Status: DC
  Administered 2013-02-18 – 2013-02-19 (×7): 2.5 mg via RESPIRATORY_TRACT
  Filled 2013-02-18 (×7): qty 0.5

## 2013-02-18 MED ORDER — PNEUMOCOCCAL VAC POLYVALENT 25 MCG/0.5ML IJ INJ
0.5000 mL | INJECTION | INTRAMUSCULAR | Status: AC
  Administered 2013-02-19: 0.5 mL via INTRAMUSCULAR
  Filled 2013-02-18: qty 0.5

## 2013-02-18 MED ORDER — CYCLOBENZAPRINE HCL 10 MG PO TABS
10.0000 mg | ORAL_TABLET | Freq: Two times a day (BID) | ORAL | Status: DC | PRN
  Administered 2013-02-19: 10 mg via ORAL
  Filled 2013-02-18: qty 1

## 2013-02-18 MED ORDER — DEXTROSE 5 % IV SOLN
500.0000 mg | INTRAVENOUS | Status: DC
  Administered 2013-02-18 – 2013-02-19 (×2): 500 mg via INTRAVENOUS
  Filled 2013-02-18 (×4): qty 500

## 2013-02-18 MED ORDER — BENZONATATE 100 MG PO CAPS
200.0000 mg | ORAL_CAPSULE | Freq: Three times a day (TID) | ORAL | Status: DC
  Administered 2013-02-18 – 2013-02-19 (×3): 200 mg via ORAL
  Filled 2013-02-18 (×3): qty 2

## 2013-02-18 MED ORDER — POTASSIUM CHLORIDE IN NACL 20-0.9 MEQ/L-% IV SOLN
INTRAVENOUS | Status: DC
  Administered 2013-02-18 – 2013-02-19 (×2): via INTRAVENOUS

## 2013-02-18 MED ORDER — OXYCODONE HCL 5 MG PO TABS
30.0000 mg | ORAL_TABLET | Freq: Four times a day (QID) | ORAL | Status: DC | PRN
  Administered 2013-02-18 – 2013-02-19 (×6): 30 mg via ORAL
  Filled 2013-02-18 (×6): qty 6

## 2013-02-18 MED ORDER — SORBITOL 70 % SOLN: 30.0000 mL | Freq: Every day | Status: DC | PRN

## 2013-02-18 MED ORDER — PREDNISONE 10 MG PO TABS: 10.0000 mg | ORAL_TABLET | Freq: Every day | ORAL | Status: DC

## 2013-02-18 MED ORDER — PREDNISONE 20 MG PO TABS
50.0000 mg | ORAL_TABLET | Freq: Every day | ORAL | Status: DC
  Administered 2013-02-18 – 2013-02-19 (×2): 50 mg via ORAL
  Filled 2013-02-18: qty 1
  Filled 2013-02-18: qty 2

## 2013-02-18 MED ORDER — GUAIFENESIN-DM 100-10 MG/5ML PO SYRP: 5.0000 mL | ORAL_SOLUTION | ORAL | Status: DC | PRN

## 2013-02-18 MED ORDER — IPRATROPIUM BROMIDE 0.02 % IN SOLN
0.5000 mg | RESPIRATORY_TRACT | Status: DC
  Administered 2013-02-18 – 2013-02-19 (×7): 0.5 mg via RESPIRATORY_TRACT
  Filled 2013-02-18 (×6): qty 2.5

## 2013-02-18 NOTE — H&P (Signed)
[facility] History and Physical  Tammy Blair  ZOX:[id number]  DOB: 1951-05-19   DOA: 02/18/2013   PCP:   No primary provider on file.   Chief Complaint:  Difficulty breathing for 4 days  HPI: Tammy Blair is an 62 y.o. female.   History of asthma and COPD, on 3 L of home O2, with ongoing tobacco use, as when having persistent shortness of breath and wheezing for the past 4 days despite use of her home nebulizer. Eventually she came to the emergency room to be evaluated and failed to respond adequately in the emergency room and the hospitalist service was called for admission. She denies fever or chills but admits a cough initially productive of white sputum and eventually yellow sputum. Denies chest pain, has chronic back pain and is now having pains in both legs.  Patient admits to having anxiety and depression.  She reports ongoing wheezing and shortness of breath right now, but she seems to be extremely anxious.   Rewiew of Systems:   All systems negative except as marked bold or noted in the HPI;  Constitutional:    malaise, fever and chills. ;  Eyes:   eye pain, redness and discharge. ;  ENMT:   ear pain, hoarseness, nasal congestion, sinus pressure and sore throat. ;  Cardiovascular:    chest pain, palpitations, diaphoresis,  peripheral edema.  Respiratory:   cough, hemoptysis, wheezing and stridor. ;  Gastrointestinal:  nausea, vomiting, diarrhea, constipation, abdominal pain, melena, blood in stool, hematemesis, jaundice and rectal bleeding. unusual weight loss..   Genitourinary:    frequency, dysuria, incontinence,flank pain and hematuria; Musculoskeletal:  Chronic back pain and neck pain.  swelling and trauma.;  Skin: .  pruritus, rash, abrasions, bruising and skin lesion.; ulcerations Neuro:    headache, lightheadedness and neck stiffness.  weakness, altered level of consciousness, altered mental status, extremity weakness, burning feet, involuntary movement, seizure and  syncope.  Psych:    anxiety, depression, insomnia, tearfulness, panic attacks, hallucinations, paranoia, suicidal or homicidal ideation    Past Medical History  Diagnosis Date  . Asthma   . COPD (chronic obstructive pulmonary disease)   . Back pain     Past Surgical History  Procedure Laterality Date  . Cesarean section    . Tonsillectomy      Medications:  HOME MEDS: Prior to Admission medications   Medication Sig Start Date End Date Taking? Authorizing Provider  albuterol (PROAIR HFA) 108 (90 BASE) MCG/ACT inhaler Inhale 2 puffs into the lungs 4 (four) times daily as needed. For shortness of breath   Yes Historical Provider, MD  albuterol (PROVENTIL) (2.5 MG/3ML) 0.083% nebulizer solution Take 2.5 mg by nebulization every 4 (four) hours as needed. For shortness of breath   Yes Historical Provider, MD  ALPRAZolam Prudy Feeler) 1 MG tablet Take 1 mg by mouth 4 (four) times daily as needed. For anxiety   Yes Historical Provider, MD  budesonide-formoterol (SYMBICORT) 160-4.5 MCG/ACT inhaler Inhale 1 puff into the lungs 2 (two) times daily.    Yes Historical Provider, MD  cyclobenzaprine (FLEXERIL) 10 MG tablet Take 10 mg by mouth 2 (two) times daily as needed. For muscle spasm   Yes Historical Provider, MD  oxycodone (ROXICODONE) 30 MG immediate release tablet Take 30 mg by mouth every 6 (six) hours as needed. For severe pain   Yes Historical Provider, MD  PRESCRIPTION MEDICATION Take 1 tablet by mouth 2 (two) times daily. For Heartburn. NAME IS UNKNOWN   Yes  Historical Provider, MD     Allergies:  No Known Allergies  Social History:   reports that she has been smoking Cigarettes.  She has a 20 pack-year smoking history. She does not have any smokeless tobacco history on file. She reports that she does not drink alcohol or use illicit drugs.  Family History: Family History  Problem Relation Age of Onset  . COPD Mother   . Cancer Mother     kidney  . Stroke Brother   . Asthma  Sister      Physical Exam: Filed Vitals:   02/18/13 0016 02/18/13 0236 02/18/13 0300 02/18/13 0414  BP:  128/67 138/70 138/72  Pulse:  84 89 88  Temp:    97.9 F (36.6 C)  TempSrc:    Oral  Resp:  14  15  Height:    5\' 9"  (1.753 m)  Weight:    95.6 kg (210 lb 12.2 oz)  SpO2: 97% 96% 96% 94%   Blood pressure 138/72, pulse 88, temperature 97.9 F (36.6 C), temperature source Oral, resp. rate 15, height 5\' 9"  (1.753 m), weight 95.6 kg (210 lb 12.2 oz), SpO2 94.00%.  GEN:  Pleasant but extremely nervous middle-aged Caucasian lady lying in bed ; cooperative with exam PSYCH:  alert and oriented x4;   affect is appropriate. HEENT: Mucous membranes pink and anicteric; PERRLA; EOM intact; no cervical lymphadenopathy nor thyromegaly or carotid bruit; no JVD; Breasts:: Not examined CHEST WALL: No tenderness CHEST: Normal respiration, clear to auscultation bilaterally HEART: Regular rate and rhythm; no murmurs rubs or gallops BACK:  no scoliosis; no CVA tenderness ABDOMEN: Obese, soft non-tender; no masses, no organomegaly, normal abdominal bowel sounds; ; no intertriginous candida. Rectal Exam: Not done EXTREMITIES: age-appropriate arthropathy of the hands and knees; no edema; no ulcerations. Genitalia: not examined PULSES: 2+ and symmetric SKIN: Normal hydration no rash or ulceration CNS: Cranial nerves 2-12 grossly intact no focal lateralizing neurologic deficit   Labs on Admission:  Basic Metabolic Panel:  Recent Labs Lab 02/18/13 0035  NA 140  K 3.4*  CL 100  CO2 32  GLUCOSE 101*  BUN 9  CREATININE 0.86  CALCIUM 9.5   Liver Function Tests: No results found for this basename: AST, ALT, ALKPHOS, BILITOT, PROT, ALBUMIN,  in the last 168 hours No results found for this basename: LIPASE, AMYLASE,  in the last 168 hours No results found for this basename: AMMONIA,  in the last 168 hours CBC:  Recent Labs Lab 02/18/13 0035  WBC 8.9  HGB 14.6  HCT 44.4  MCV 94.9  PLT  284   Cardiac Enzymes:  Recent Labs Lab 02/18/13 0035  TROPONINI <0.30   BNP: No components found with this basename: POCBNP,  D-dimer: No components found with this basename: D-DIMER,  CBG: No results found for this basename: GLUCAP,  in the last 168 hours  Radiological Exams on Admission: Dg Chest 2 View  02/18/2013  *RADIOLOGY REPORT*  Clinical Data: Shortness of breath, cough, congestion, fever  CHEST - 2 VIEW  Comparison: 09/25/2012  Findings: Normal heart size, mediastinal contours, and pulmonary vascularity. Lungs clear. No pleural effusion or pneumothorax. Bones unremarkable.  IMPRESSION: No acute abnormalities.   Original Report Authenticated By: Ulyses Southward, M.D.        Assessment/Plan Present on Admission:  . COPD exacerbation, improved  . GAD (generalized anxiety disorder) . HYPERTENSION . Chronic pain syndrome . Tobacco use disorder . Chronic respiratory failure . Obesity, unspecified   PLAN:  bring this lady on observation to continue treatment of her COPD with nebulizers and oral steroids; Will add Zithromax Anxiety seems to be a predominant symptom; will continue her alprazolam. Counsel on nicotine cessation Continue respiratory support Other plans as per orders.  Code Status: FULL CODE Family Communication: Discussed with patient  Disposition Plan: Home when stable    CAMPBELL,LEOPOLD Nocturnist [facility] Pager [phone]  02/18/2013, 6:01 AM

## 2013-02-18 NOTE — Progress Notes (Signed)
Utilization Review Complete  

## 2013-02-18 NOTE — Progress Notes (Signed)
Patient was admitted earlier today by Dr. Orvan Falconer.  Patient seen and examined, database reviewed  She has been admitted with COPD exacerbation.  She is on 3L of oxygen at home and has very poor functional capacity.  She is moving air adequately.  She is coughing and is mildly wheezing.  Will continue current treatments and plan for possible discharge home tomorrow with services.

## 2013-02-19 LAB — CBC
HCT: 38.7 % (ref 36.0–46.0)
Hemoglobin: 12.7 g/dL (ref 12.0–15.0)
MCH: 31.3 pg (ref 26.0–34.0)
MCHC: 32.8 g/dL (ref 30.0–36.0)

## 2013-02-19 LAB — BASIC METABOLIC PANEL
BUN: 7 mg/dL (ref 6–23)
Calcium: 8.5 mg/dL (ref 8.4–10.5)
GFR calc non Af Amer: >90 mL/min (ref >90)
Glucose, Bld: 141 mg/dL — ABNORMAL HIGH (ref 70–99)

## 2013-02-19 MED ORDER — PREDNISONE 10 MG PO TABS: ORAL_TABLET | ORAL | Status: DC

## 2013-02-19 MED ORDER — AZITHROMYCIN 250 MG PO TABS: ORAL_TABLET | ORAL | Status: DC

## 2013-02-19 MED ORDER — NICOTINE 21-14-7 MG/24HR TD KIT: PACK | TRANSDERMAL | Status: DC

## 2013-02-19 MED ORDER — DEXTROMETHORPHAN-GUAIFENESIN 5-100 MG/5ML PO LIQD: 5.0000 mL | Freq: Four times a day (QID) | ORAL | Status: DC | PRN

## 2013-02-19 MED ORDER — BENZONATATE 200 MG PO CAPS: 200.0000 mg | ORAL_CAPSULE | Freq: Three times a day (TID) | ORAL | Status: DC | PRN

## 2013-02-19 NOTE — Progress Notes (Signed)
Prescriptions given, states understanding of discharge instructions.

## 2013-02-19 NOTE — Care Management Note (Signed)
    Page 1 of 1   02/19/2013     3:15:30 PM   CARE MANAGEMENT NOTE 02/19/2013  Patient:  Tammy Blair, Tammy Blair   Account Number:  [id number]  Date Initiated:  02/19/2013  Documentation initiated by:  Rosemary Holms  Subjective/Objective Assessment:   Pt admitted form home with SOB, COPD exacerbation. On 3L O2 at home. Pt to be DC'd with RN, Aide and SW. Previously with [facility].     Action/Plan:   Anticipated DC Date:  02/19/2013   Anticipated DC Plan:  HOME W HOME HEALTH SERVICES      DC Planning Services  CM consult      [facility] Choice  HOME HEALTH   Choice offered to / List presented to:  C-1 Patient        HH arranged  HH-1 RN  HH-10 DISEASE MANAGEMENT  HH-4 NURSE'S AIDE  HH-6 SOCIAL WORKER      HH agency  [facility]   Status of service:  Completed, signed off Medicare Important Message given?   (If response is "NO", the following Medicare IM given date fields will be blank) Date Medicare IM given:   Date Additional Medicare IM given:    Discharge Disposition:  HOME W HOME HEALTH SERVICES  Per UR Regulation:    If discussed at Long Length of Stay Meetings, dates discussed:    Comments:  02/19/13 Rosemary Holms RN BSN CM Confirmed with [facility] Penna that they received referral and will see pt tomorrow.

## 2013-02-19 NOTE — Discharge Summary (Signed)
Physician Discharge Summary  Tammy Blair WUJ:[id number] DOB: June 19, 1951 DOA: 02/17/2013  PCP: No primary provider on file.  Admit date: 02/17/2013 Discharge date: 02/19/2013  Time spent: 35 minutes  Recommendations for Outpatient Follow-up:  1. Patient has been set up with home health RN, aide, social worker 2. Followup with primary care doctor in 2 weeks  Discharge Diagnoses:  Active Problems:   HYPERTENSION   COPD exacerbation   GAD (generalized anxiety disorder)   Chronic pain syndrome   Tobacco use disorder   Chronic respiratory failure   Obesity, unspecified   Discharge Condition: Improved  Diet recommendation: Low-salt  Filed Weights   02/18/13 0414 02/19/13 0526 02/19/13 0557  Weight: 95.6 kg (210 lb 12.2 oz) 98.7 kg (217 lb 9.5 oz) 98.6 kg (217 lb 6 oz)    History of present illness:  Tammy Blair is an 62 y.o. female. History of asthma and COPD, on 3 L of home O2, with ongoing tobacco use, as when having persistent shortness of breath and wheezing for the past 4 days despite use of her home nebulizer. Eventually she came to the emergency room to be evaluated and failed to respond adequately in the emergency room and the hospitalist service was called for admission.  She denies fever or chills but admits a cough initially productive of white sputum and eventually yellow sputum. Denies chest pain, has chronic back pain and is now having pains in both legs.  Patient admits to having anxiety and depression.  She reports ongoing wheezing and shortness of breath right now, but she seems to be extremely anxious.   Hospital Course:  This lady was admitted with shortness of breath due to COPD exacerbation. She continues to smoke. She was started on steroids, antibiotics and nebulizer treatments. She was given antitussives for her persistent cough. Her chest x-ray did not indicate any pneumonia. Per hospital course she did improve. Her wheezing has completely resolved. Her respiratory  status has improved. She appears to have severe COPD at baseline, is chronically on 3 L of oxygen, and has very poor functional capacity. She is unable to walk to her mailbox to get her mail without becoming severely short of breath. She has been advised to quit smoking. She's been set up with home health services. She appears to be back to baseline and can be discharged home for further care. She does have a nebulizer at home as well as oxygen.  Procedures:  None  Consultations:  None  Discharge Exam: Filed Vitals:   02/19/13 0557 02/19/13 0711 02/19/13 1103 02/19/13 1117  BP:   125/77   Pulse:   75   Temp:   97.5 F (36.4 C)   TempSrc:   Oral   Resp:   18   Height:      Weight: 98.6 kg (217 lb 6 oz)     SpO2:  97% 95% 95%    General: No acute distress Cardiovascular: S1, S2, regular rate and rhythm Respiratory: Clear to auscultation bilaterally  Discharge Instructions  Discharge Orders   Future Orders Complete By Expires     Call MD for:  difficulty breathing, headache or visual disturbances  As directed     Call MD for:  temperature >100.4  As directed     Diet - low sodium heart healthy  As directed     Face-to-face encounter (required for Medicare/Medicaid patients)  As directed     Comments:      I MEMON,JEHANZEB certify that this  patient is under my care and that I, or a nurse practitioner or physician's assistant working with me, had a face-to-face encounter that meets the physician face-to-face encounter requirements with this patient on 02/19/2013. The encounter with the patient was in whole, or in part for the following medical condition(s) which is the primary reason for home health care (List medical condition): Patient admitted with COPD exacerbation and has very poor functional capacity at baseline.  She would benefit from home health nursing for disease management and education.    Questions:      The encounter with the patient was in whole, or in part, for the  following medical condition, which is the primary reason for home health care:  copd exacerbation    I certify that, based on my findings, the following services are medically necessary home health services:  Nursing    My clinical findings support the need for the above services:  Shortness of breath with activity    Further, I certify that my clinical findings support that this patient is homebound due to:  Shortness of Breath with activity    Reason for Medically Necessary Home Health Services:  Skilled Nursing- Teaching of Disease Process/Symptom Management    Home Health  As directed     Questions:      To provide the following care/treatments:  RN    Home Health Aide    Social work    Increase activity slowly  As directed         Medication List    TAKE these medications       ALPRAZolam 1 MG tablet  Commonly known as:  XANAX  Take 1 mg by mouth 4 (four) times daily as needed. For anxiety     azithromycin 250 MG tablet  Commonly known as:  ZITHROMAX Z-PAK  Take one tablet po daily until done     benzonatate 200 MG capsule  Commonly known as:  TESSALON  Take 1 capsule (200 mg total) by mouth 3 (three) times daily as needed for cough.     budesonide-formoterol 160-4.5 MCG/ACT inhaler  Commonly known as:  SYMBICORT  Inhale 1 puff into the lungs 2 (two) times daily.     cyclobenzaprine 10 MG tablet  Commonly known as:  FLEXERIL  Take 10 mg by mouth 2 (two) times daily as needed. For muscle spasm     Dextromethorphan-Guaifenesin 5-100 MG/5ML Liqd  Commonly known as:  MUCINEX FAST-MAX DM MAX  Take 5 mLs by mouth every 6 (six) hours as needed (cough).     Nicotine 21-14-7 MG/24HR Kit  Place one patch on skin daily, use as directed     oxycodone 30 MG immediate release tablet  Commonly known as:  ROXICODONE  Take 30 mg by mouth every 6 (six) hours as needed. For severe pain     predniSONE 10 MG tablet  Commonly known as:  DELTASONE  Take 40mg  po daily for 2 days then  30mg  po daily for 2 days then 20mg  po daily for 2 days then 10mg  po daily for 2 days then stop     PRESCRIPTION MEDICATION  Take 1 tablet by mouth 2 (two) times daily. For Heartburn. NAME IS UNKNOWN     PROAIR HFA 108 (90 BASE) MCG/ACT inhaler  Generic drug:  albuterol  Inhale 2 puffs into the lungs 4 (four) times daily as needed. For shortness of breath     albuterol (2.5 MG/3ML) 0.083% nebulizer solution  Commonly known as:  PROVENTIL  Take 2.5 mg by nebulization every 4 (four) hours as needed. For shortness of breath           Follow-up Information   Follow up with follow up with primary care doctor in 1-2 weeks.       The results of significant diagnostics from this hospitalization (including imaging, microbiology, ancillary and laboratory) are listed below for reference.    Significant Diagnostic Studies: Dg Chest 2 View  02/18/2013  *RADIOLOGY REPORT*  Clinical Data: Shortness of breath, cough, congestion, fever  CHEST - 2 VIEW  Comparison: 09/25/2012  Findings: Normal heart size, mediastinal contours, and pulmonary vascularity. Lungs clear. No pleural effusion or pneumothorax. Bones unremarkable.  IMPRESSION: No acute abnormalities.   Original Report Authenticated By: Ulyses Southward, M.D.     Microbiology: No results found for this or any previous visit (from the past 240 hour(s)).   Labs: Basic Metabolic Panel:  Recent Labs Lab 02/18/13 0035 02/19/13 0444  NA 140 140  K 3.4* 3.4*  CL 100 102  CO2 32 29  GLUCOSE 101* 141*  BUN 9 7  CREATININE 0.86 0.73  CALCIUM 9.5 8.5   Liver Function Tests:  Recent Labs Lab 02/18/13 0500  AST 25  ALT 32  ALKPHOS 73  BILITOT 0.2*  PROT 8.4*  ALBUMIN 4.0   No results found for this basename: LIPASE, AMYLASE,  in the last 168 hours No results found for this basename: AMMONIA,  in the last 168 hours CBC:  Recent Labs Lab 02/18/13 0035 02/19/13 0444  WBC 8.9 12.7*  HGB 14.6 12.7  HCT 44.4 38.7  MCV 94.9 95.3   PLT 284 288   Cardiac Enzymes:  Recent Labs Lab 02/18/13 0035  TROPONINI <0.30   BNP: BNP (last 3 results) No results found for this basename: PROBNP,  in the last 8760 hours CBG: No results found for this basename: GLUCAP,  in the last 168 hours     Signed:  MEMON,JEHANZEB  [facility] 02/19/2013, 8:17 PM

## 2013-05-15 ENCOUNTER — Other Ambulatory Visit (HOSPITAL_COMMUNITY): Payer: Self-pay | Admitting: Internal Medicine

## 2013-05-15 DIAGNOSIS — Z139 Encounter for screening, unspecified: Secondary | ICD-10-CM

## 2013-05-17 ENCOUNTER — Ambulatory Visit (HOSPITAL_COMMUNITY)
Admission: RE | Admit: 2013-05-17 | Discharge: 2013-05-17 | Disposition: A | Discharge status: Home / Self Care | Payer: Medicaid Other | Source: Ambulatory Visit | Attending: Internal Medicine | Admitting: Internal Medicine

## 2013-05-17 DIAGNOSIS — Z1231 Encounter for screening mammogram for malignant neoplasm of breast: Secondary | ICD-10-CM | POA: Insufficient documentation

## 2013-05-17 DIAGNOSIS — Z139 Encounter for screening, unspecified: Secondary | ICD-10-CM

## 2013-05-20 ENCOUNTER — Other Ambulatory Visit: Payer: Self-pay | Admitting: Internal Medicine

## 2013-05-20 DIAGNOSIS — R928 Other abnormal and inconclusive findings on diagnostic imaging of breast: Secondary | ICD-10-CM

## 2013-05-22 ENCOUNTER — Inpatient Hospital Stay (HOSPITAL_COMMUNITY): Admission: RE | Admit: 2013-05-22 | Payer: Medicaid Other | Source: Ambulatory Visit

## 2013-05-23 ENCOUNTER — Ambulatory Visit (HOSPITAL_COMMUNITY): Payer: Medicaid Other

## 2013-05-29 ENCOUNTER — Ambulatory Visit (HOSPITAL_COMMUNITY)
Admission: RE | Admit: 2013-05-29 | Discharge: 2013-05-29 | Disposition: A | Discharge status: Home / Self Care | Payer: Medicaid Other | Source: Ambulatory Visit | Attending: Internal Medicine | Admitting: Internal Medicine

## 2013-05-29 ENCOUNTER — Encounter (HOSPITAL_COMMUNITY): Payer: Medicaid Other

## 2013-05-29 ENCOUNTER — Other Ambulatory Visit (HOSPITAL_COMMUNITY): Payer: Self-pay | Admitting: Internal Medicine

## 2013-05-29 DIAGNOSIS — R928 Other abnormal and inconclusive findings on diagnostic imaging of breast: Secondary | ICD-10-CM

## 2013-05-29 IMAGING — US US BREAST*L*
1 series · 5 of 5 positions shown · non-contrast
Comparison: Baseline screening mammogram dated [DATE]

CLINICAL DATA: The patient was called back from screening
mammogram for a possible mass in the left breast.

DIGITAL DIAGNOSTIC LEFT MAMMOGRAM  AND LEFT BREAST ULTRASOUND:

[Series 1: us breast*left* · 0.07mm/px · 5 of 5 slices shown]
[im 1/5]
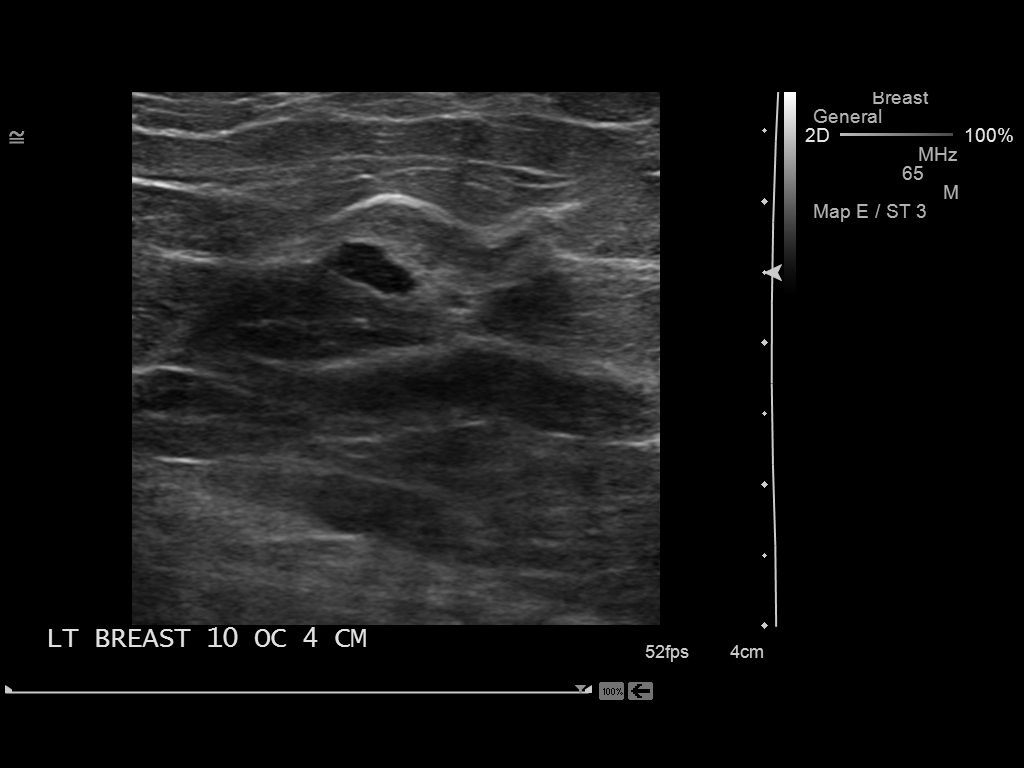
[im 2/5]
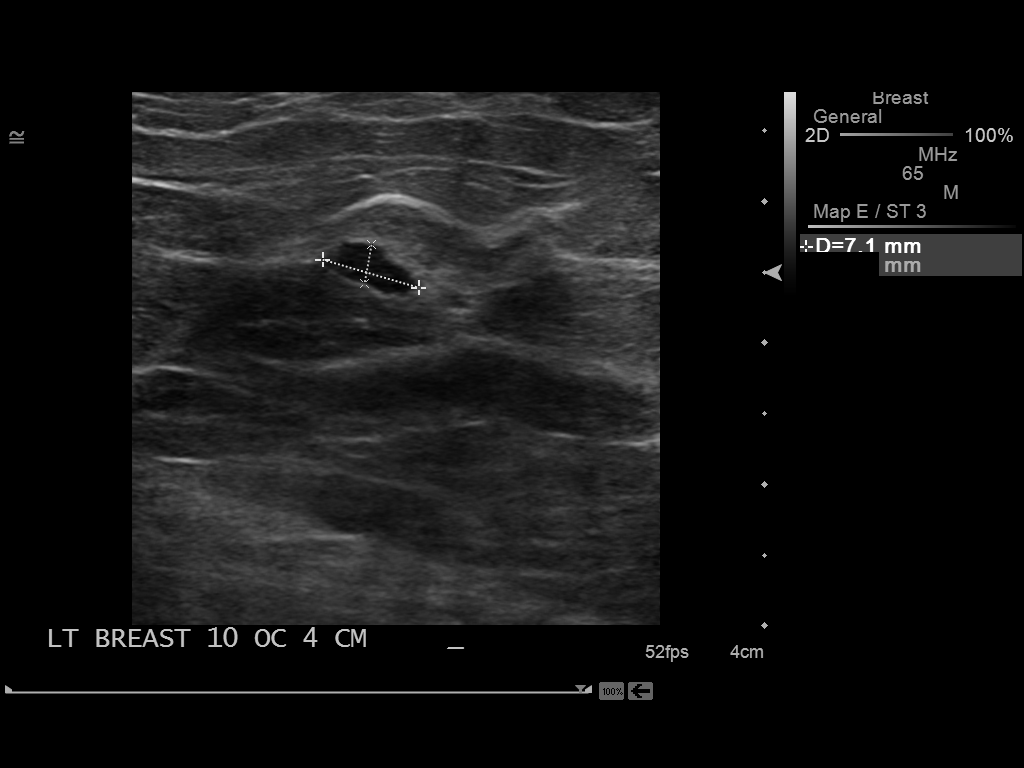
[im 3/5]
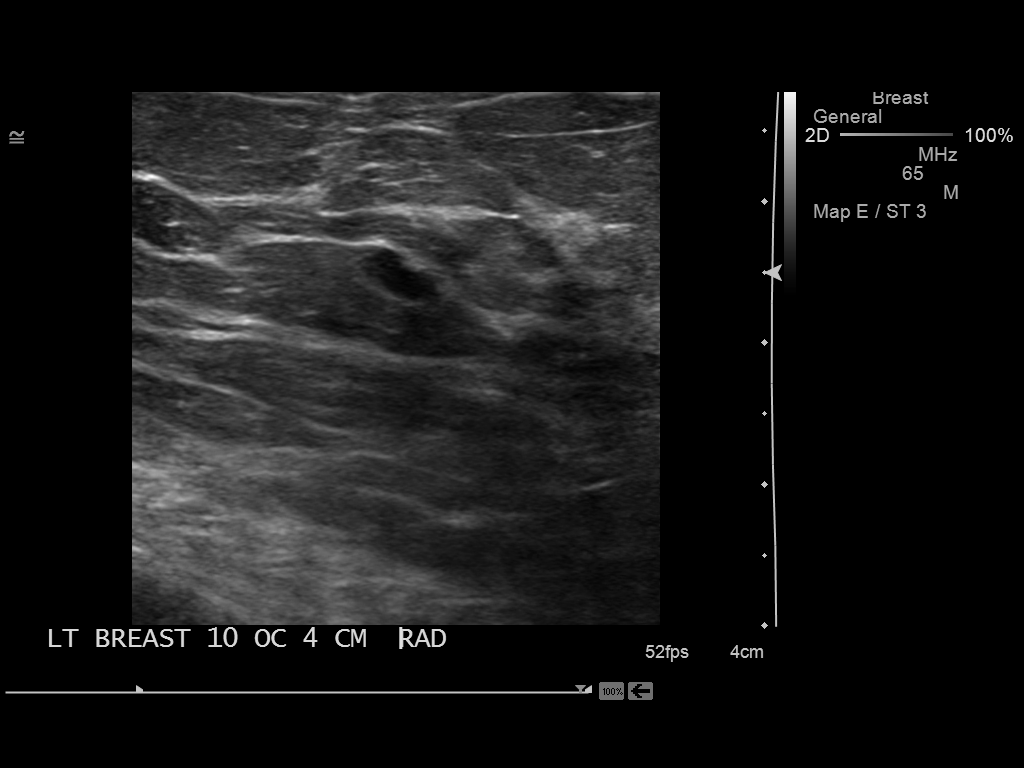
[im 4/5]
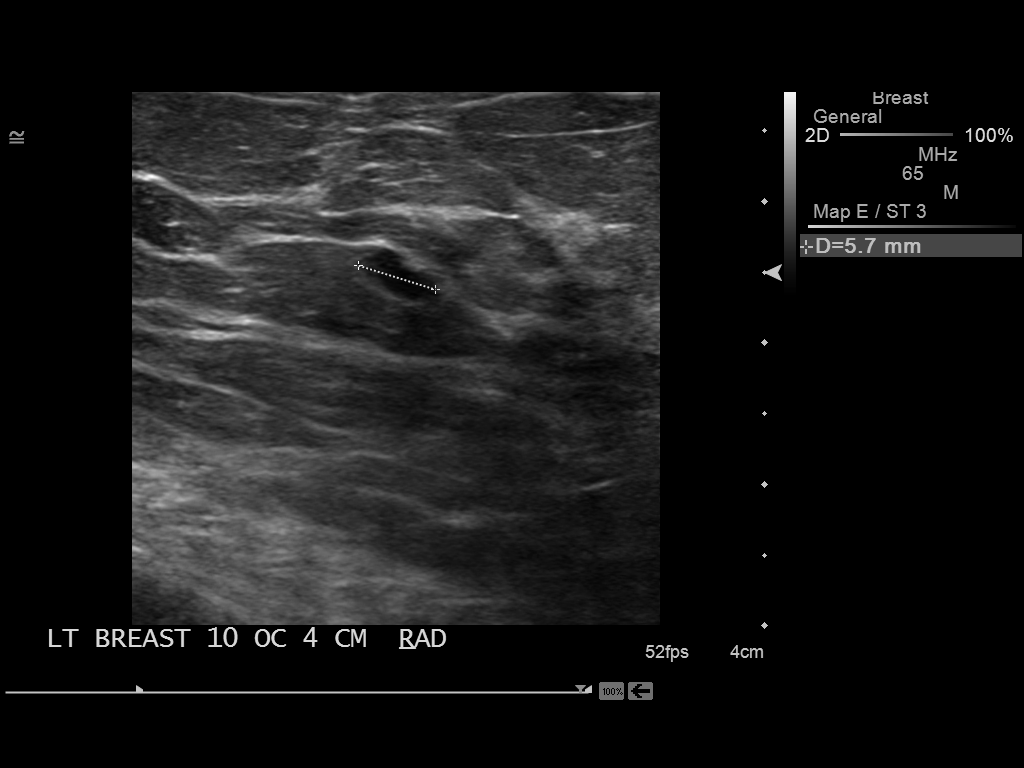
[im 5/5]
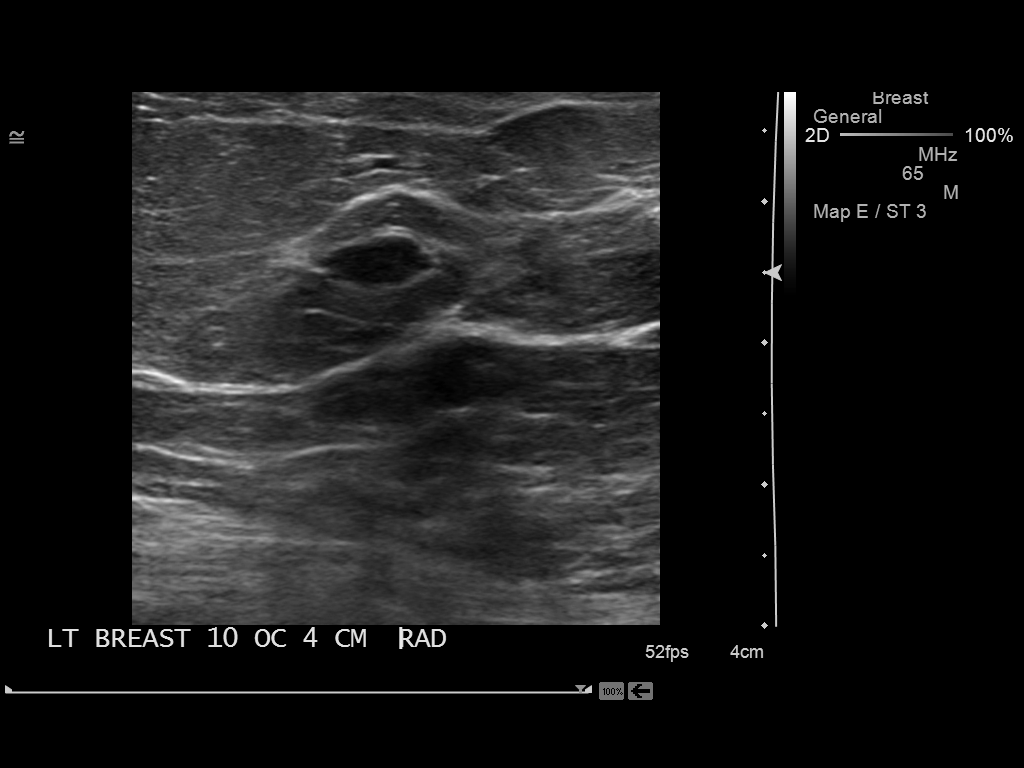

[5 of 5 positions shown; findings below may reference images not displayed]

FINDINGS: ACR Breast Density Category 1: The breast tissue is almost entirely
fatty.

Spot compression views of the left breast were performed.  There is
persistence of a 9 x 8 x 8 mm well circumscribed nodule.

On physical exam, I do not palpate a mass in the left breast.

Ultrasound is performed, showing there is a hypoechoic, well-
circumscribed nodule the left breast at 10 o'clock 4 cm from the
nipple measuring 7 x 3 x 6 mm.
IMPRESSION: Probable benign fibroadenoma in the left breast.

RECOMMENDATION:
Short-term interval follow-up left mammogram and ultrasound in 6
months is recommended.  Ultrasound-guided core biopsy was also
discussed with the patient.  The patient would like to have a 6-
month follow-up diagnostic study.

I have discussed the findings and recommendations with the patient.
Results were also provided in writing at the conclusion of the
visit.  If applicable, a reminder letter will be sent to the
patient regarding the next appointment.

BI-RADS CATEGORY 3:  Probably benign finding(s) - short interval
follow-up suggested.

## 2013-05-30 ENCOUNTER — Ambulatory Visit (INDEPENDENT_AMBULATORY_CARE_PROVIDER_SITE_OTHER): Payer: Medicaid Other | Admitting: Otolaryngology

## 2013-06-13 ENCOUNTER — Ambulatory Visit (INDEPENDENT_AMBULATORY_CARE_PROVIDER_SITE_OTHER): Payer: Medicaid Other | Admitting: Otolaryngology

## 2013-06-13 DIAGNOSIS — H95129 Granulation of postmastoidectomy cavity, unspecified ear: Secondary | ICD-10-CM

## 2013-08-21 ENCOUNTER — Encounter (HOSPITAL_COMMUNITY): Payer: Self-pay

## 2013-08-21 ENCOUNTER — Inpatient Hospital Stay (HOSPITAL_COMMUNITY)
Admission: EM | Admit: 2013-08-21 | Discharge: 2013-08-22 | DRG: 191 | Disposition: A | Discharge status: Home / Self Care | Payer: Medicaid Other | Attending: Internal Medicine | Admitting: Internal Medicine

## 2013-08-21 ENCOUNTER — Emergency Department (HOSPITAL_COMMUNITY): Payer: Medicaid Other

## 2013-08-21 ENCOUNTER — Other Ambulatory Visit: Payer: Self-pay | Admitting: Obstetrics and Gynecology

## 2013-08-21 DIAGNOSIS — E669 Obesity, unspecified: Secondary | ICD-10-CM | POA: Diagnosis present

## 2013-08-21 DIAGNOSIS — I10 Essential (primary) hypertension: Secondary | ICD-10-CM | POA: Diagnosis present

## 2013-08-21 DIAGNOSIS — J449 Chronic obstructive pulmonary disease, unspecified: Secondary | ICD-10-CM

## 2013-08-21 DIAGNOSIS — R635 Abnormal weight gain: Secondary | ICD-10-CM

## 2013-08-21 DIAGNOSIS — Z6831 Body mass index (BMI) 31.0-31.9, adult: Secondary | ICD-10-CM

## 2013-08-21 DIAGNOSIS — R739 Hyperglycemia, unspecified: Secondary | ICD-10-CM | POA: Diagnosis present

## 2013-08-21 DIAGNOSIS — F17200 Nicotine dependence, unspecified, uncomplicated: Secondary | ICD-10-CM | POA: Diagnosis present

## 2013-08-21 DIAGNOSIS — Z9981 Dependence on supplemental oxygen: Secondary | ICD-10-CM

## 2013-08-21 DIAGNOSIS — G894 Chronic pain syndrome: Secondary | ICD-10-CM | POA: Diagnosis present

## 2013-08-21 DIAGNOSIS — T380X5A Adverse effect of glucocorticoids and synthetic analogues, initial encounter: Secondary | ICD-10-CM | POA: Diagnosis present

## 2013-08-21 DIAGNOSIS — H9190 Unspecified hearing loss, unspecified ear: Secondary | ICD-10-CM | POA: Diagnosis present

## 2013-08-21 DIAGNOSIS — J441 Chronic obstructive pulmonary disease with (acute) exacerbation: Principal | ICD-10-CM | POA: Diagnosis present

## 2013-08-21 DIAGNOSIS — R7309 Other abnormal glucose: Secondary | ICD-10-CM | POA: Diagnosis present

## 2013-08-21 DIAGNOSIS — J9610 Chronic respiratory failure, unspecified whether with hypoxia or hypercapnia: Secondary | ICD-10-CM | POA: Diagnosis present

## 2013-08-21 DIAGNOSIS — F411 Generalized anxiety disorder: Secondary | ICD-10-CM | POA: Diagnosis present

## 2013-08-21 LAB — BASIC METABOLIC PANEL
BUN: 8 mg/dL (ref 6–23)
CO2: 32 mEq/L (ref 19–32)
Calcium: 9.6 mg/dL (ref 8.4–10.5)
Calcium: 9.2 mg/dL (ref 8.4–10.5)
Chloride: 101 mEq/L (ref 96–112)
Creatinine, Ser: 0.89 mg/dL (ref 0.50–1.10)
Creatinine, Ser: 0.79 mg/dL (ref 0.50–1.10)
GFR calc Af Amer: >90 mL/min (ref >90)
GFR calc non Af Amer: 69 mL/min — ABNORMAL LOW (ref >90)
GFR calc non Af Amer: 89 mL/min — ABNORMAL LOW (ref >90)
Glucose, Bld: 189 mg/dL — ABNORMAL HIGH (ref 70–99)
Sodium: 137 mEq/L (ref 135–145)

## 2013-08-21 LAB — GLUCOSE, CAPILLARY
Glucose-Capillary: 231 mg/dL — ABNORMAL HIGH (ref 70–99)
Glucose-Capillary: 271 mg/dL — ABNORMAL HIGH (ref 70–99)

## 2013-08-21 LAB — URINALYSIS, ROUTINE W REFLEX MICROSCOPIC
Bilirubin Urine: NEGATIVE
Ketones, ur: NEGATIVE mg/dL
Leukocytes, UA: NEGATIVE
Nitrite: NEGATIVE
Specific Gravity, Urine: 1.01 (ref 1.005–1.030)
Urobilinogen, UA: 0.2 mg/dL (ref 0.0–1.0)
pH: 6 (ref 5.0–8.0)

## 2013-08-21 LAB — CBC WITH DIFFERENTIAL/PLATELET
Basophils Absolute: 0 10*3/uL (ref 0.0–0.1)
Eosinophils Absolute: 1.5 10*3/uL — ABNORMAL HIGH (ref 0.0–0.7)
Eosinophils Relative: 14 % — ABNORMAL HIGH (ref 0–5)
HCT: 43 % (ref 36.0–46.0)
Lymphocytes Relative: 25 % (ref 12–46)
Lymphs Abs: 2.6 10*3/uL (ref 0.7–4.0)
MCH: 31.9 pg (ref 26.0–34.0)
MCV: 95.1 fL (ref 78.0–100.0)
Monocytes Absolute: 0.6 10*3/uL (ref 0.1–1.0)
Platelets: 273 10*3/uL (ref 150–400)
RDW: 13.1 % (ref 11.5–15.5)
WBC: 10.5 10*3/uL (ref 4.0–10.5)

## 2013-08-21 LAB — BLOOD GAS, ARTERIAL
Bicarbonate: 27.5 mEq/L — ABNORMAL HIGH (ref 20.0–24.0)
O2 Content: 4 L/min
O2 Saturation: 94.5 %
Patient temperature: 37
pH, Arterial: 7.324 — ABNORMAL LOW (ref 7.350–7.450)

## 2013-08-21 LAB — HEMOGLOBIN A1C
Hgb A1c MFr Bld: 6.3 % — ABNORMAL HIGH (ref <5.7)
Mean Plasma Glucose: 134 mg/dL — ABNORMAL HIGH (ref <117)

## 2013-08-21 LAB — CBC
MCHC: 33.2 g/dL (ref 30.0–36.0)
Platelets: 253 10*3/uL (ref 150–400)
RDW: 13.1 % (ref 11.5–15.5)
WBC: 8.7 10*3/uL (ref 4.0–10.5)

## 2013-08-21 LAB — URINE MICROSCOPIC-ADD ON

## 2013-08-21 IMAGING — CR DG CHEST 1V PORT
1 series · 1 of 1 positions shown · non-contrast
Comparison: [DATE]

CLINICAL DATA: Shortness of breath and COPD

PORTABLE CHEST - 1 VIEW

[portable]
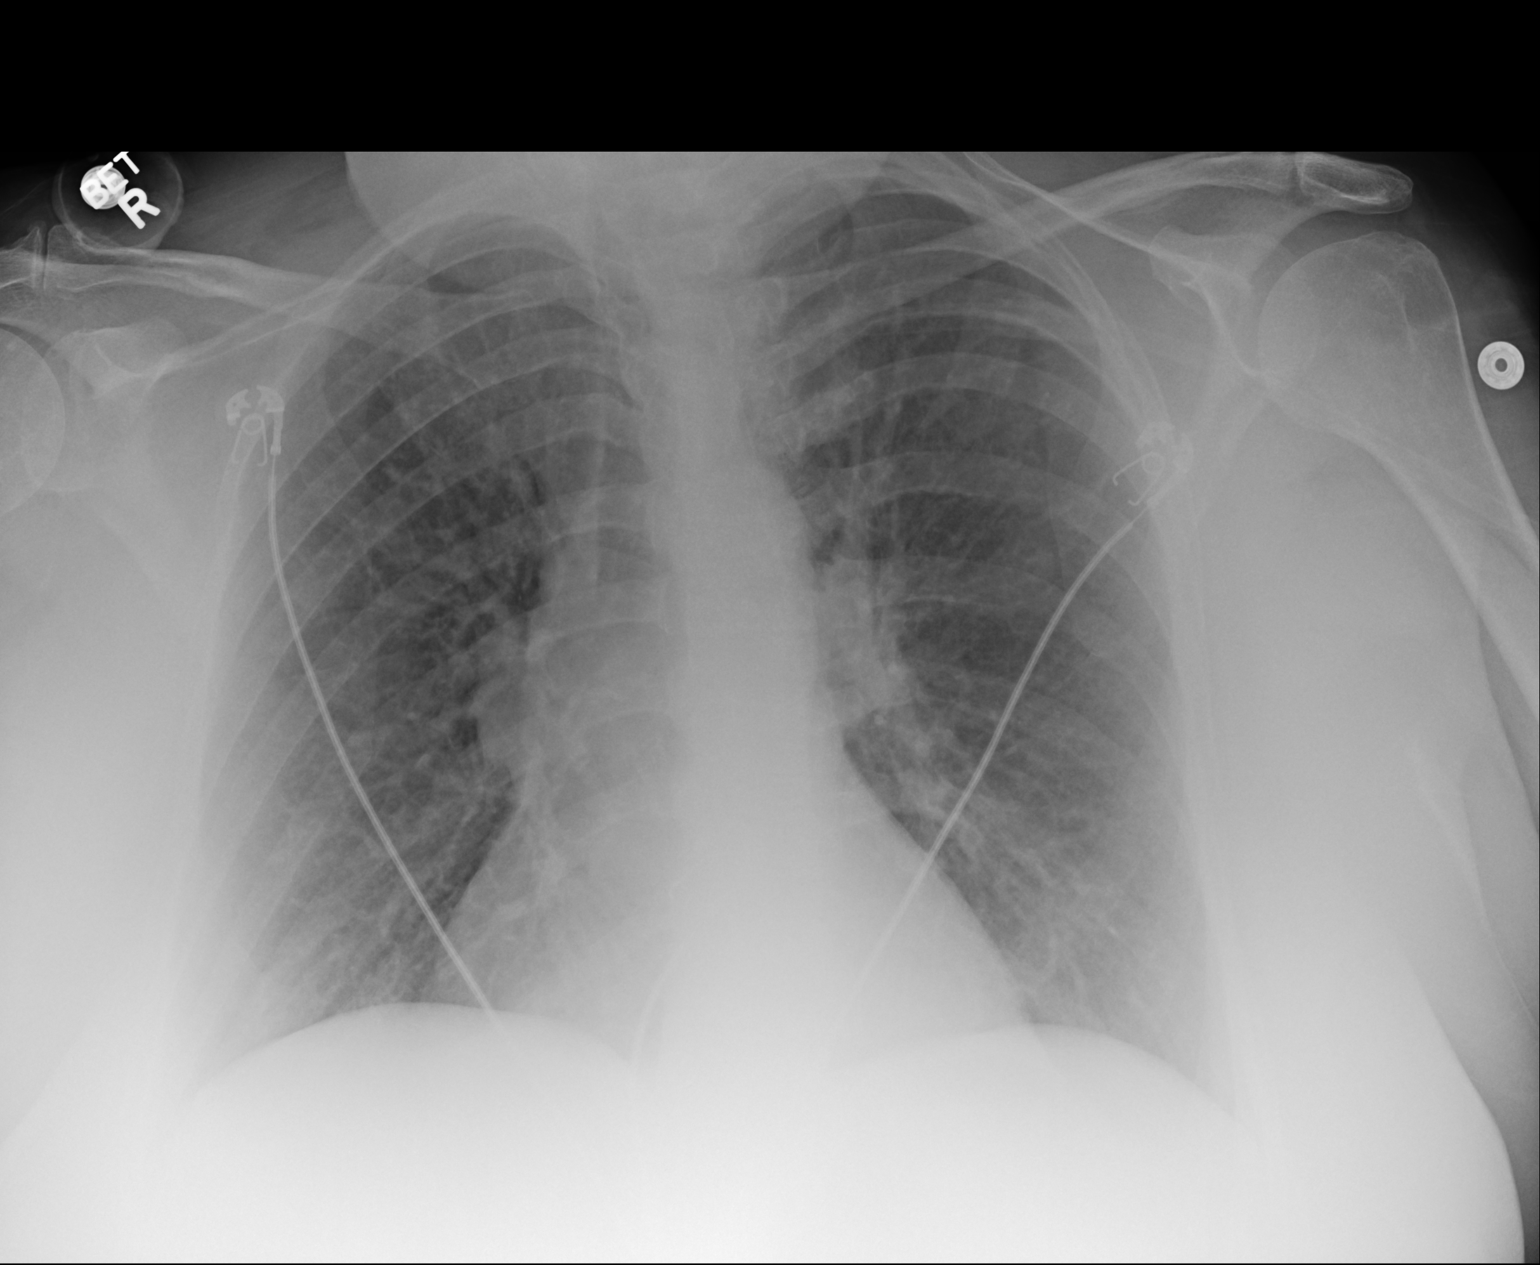

[1 of 1 positions shown; findings below may reference images not displayed]

FINDINGS: The heart size and mediastinal contours are within normal
limits.  Both lungs are clear.  The visualized skeletal structures
are unremarkable.
IMPRESSION: Negative exam.

## 2013-08-21 MED ORDER — BUDESONIDE-FORMOTEROL FUMARATE 160-4.5 MCG/ACT IN AERO
1.0000 | INHALATION_SPRAY | Freq: Two times a day (BID) | RESPIRATORY_TRACT | Status: DC
  Administered 2013-08-21 – 2013-08-22 (×3): 1 via RESPIRATORY_TRACT
  Filled 2013-08-21: qty 6

## 2013-08-21 MED ORDER — GUAIFENESIN ER 600 MG PO TB12
1200.0000 mg | ORAL_TABLET | Freq: Two times a day (BID) | ORAL | Status: DC
  Administered 2013-08-21 (×2): 1200 mg via ORAL
  Filled 2013-08-21 (×2): qty 2

## 2013-08-21 MED ORDER — DEXTROSE 5 % IV SOLN
INTRAVENOUS | Status: AC
  Filled 2013-08-21: qty 500

## 2013-08-21 MED ORDER — ALBUTEROL SULFATE (5 MG/ML) 0.5% IN NEBU
5.0000 mg | INHALATION_SOLUTION | Freq: Once | RESPIRATORY_TRACT | Status: AC
  Administered 2013-08-21: 5 mg via RESPIRATORY_TRACT
  Filled 2013-08-21: qty 1

## 2013-08-21 MED ORDER — SODIUM CHLORIDE 0.9 % IV SOLN
INTRAVENOUS | Status: AC
  Administered 2013-08-21: 04:00:00 via INTRAVENOUS

## 2013-08-21 MED ORDER — BISACODYL 5 MG PO TBEC: 5.0000 mg | DELAYED_RELEASE_TABLET | Freq: Every day | ORAL | Status: DC | PRN

## 2013-08-21 MED ORDER — DEXTROSE 5 % IV SOLN
500.0000 mg | INTRAVENOUS | Status: DC
  Administered 2013-08-21 – 2013-08-22 (×2): 500 mg via INTRAVENOUS
  Filled 2013-08-21 (×3): qty 500

## 2013-08-21 MED ORDER — IPRATROPIUM BROMIDE 0.02 % IN SOLN
0.5000 mg | Freq: Once | RESPIRATORY_TRACT | Status: AC
  Administered 2013-08-21: 0.5 mg via RESPIRATORY_TRACT
  Filled 2013-08-21: qty 2.5

## 2013-08-21 MED ORDER — POLYETHYLENE GLYCOL 3350 17 G PO PACK: 17.0000 g | PACK | Freq: Every day | ORAL | Status: DC | PRN

## 2013-08-21 MED ORDER — INSULIN ASPART 100 UNIT/ML ~~LOC~~ SOLN
0.0000 [IU] | Freq: Three times a day (TID) | SUBCUTANEOUS | Status: DC
  Administered 2013-08-21: 8 [IU] via SUBCUTANEOUS
  Administered 2013-08-21: 5 [IU] via SUBCUTANEOUS
  Administered 2013-08-22: 8 [IU] via SUBCUTANEOUS

## 2013-08-21 MED ORDER — ALBUTEROL SULFATE (5 MG/ML) 0.5% IN NEBU: 2.5000 mg | INHALATION_SOLUTION | RESPIRATORY_TRACT | Status: DC | PRN

## 2013-08-21 MED ORDER — KETOROLAC TROMETHAMINE 30 MG/ML IJ SOLN
30.0000 mg | Freq: Once | INTRAMUSCULAR | Status: AC
  Administered 2013-08-21: 30 mg via INTRAVENOUS
  Filled 2013-08-21: qty 1

## 2013-08-21 MED ORDER — HYDROCERIN EX CREA
TOPICAL_CREAM | Freq: Two times a day (BID) | CUTANEOUS | Status: DC
  Administered 2013-08-21 (×2): via TOPICAL
  Administered 2013-08-22: 1 via TOPICAL
  Filled 2013-08-21: qty 113

## 2013-08-21 MED ORDER — FLEET ENEMA 7-19 GM/118ML RE ENEM: 1.0000 | ENEMA | Freq: Once | RECTAL | Status: AC | PRN

## 2013-08-21 MED ORDER — ENOXAPARIN SODIUM 40 MG/0.4ML ~~LOC~~ SOLN
40.0000 mg | SUBCUTANEOUS | Status: DC
  Administered 2013-08-21 – 2013-08-22 (×2): 40 mg via SUBCUTANEOUS
  Filled 2013-08-21 (×2): qty 0.4

## 2013-08-21 MED ORDER — ACETAMINOPHEN 325 MG PO TABS: 650.0000 mg | ORAL_TABLET | ORAL | Status: DC | PRN

## 2013-08-21 MED ORDER — METHYLPREDNISOLONE SODIUM SUCC 125 MG IJ SOLR
125.0000 mg | Freq: Once | INTRAMUSCULAR | Status: AC
  Administered 2013-08-21: 125 mg via INTRAVENOUS
  Filled 2013-08-21: qty 2

## 2013-08-21 MED ORDER — METHYLPREDNISOLONE SODIUM SUCC 125 MG IJ SOLR
125.0000 mg | Freq: Four times a day (QID) | INTRAMUSCULAR | Status: DC
  Administered 2013-08-21: 125 mg via INTRAVENOUS
  Filled 2013-08-21: qty 2

## 2013-08-21 MED ORDER — METHYLPREDNISOLONE SODIUM SUCC 125 MG IJ SOLR
80.0000 mg | Freq: Four times a day (QID) | INTRAMUSCULAR | Status: DC
  Administered 2013-08-21 – 2013-08-22 (×5): 80 mg via INTRAVENOUS
  Filled 2013-08-21 (×5): qty 2

## 2013-08-21 MED ORDER — ALBUTEROL SULFATE (5 MG/ML) 0.5% IN NEBU
2.5000 mg | INHALATION_SOLUTION | RESPIRATORY_TRACT | Status: DC
  Administered 2013-08-21 – 2013-08-22 (×6): 2.5 mg via RESPIRATORY_TRACT
  Filled 2013-08-21 (×7): qty 0.5

## 2013-08-21 MED ORDER — SODIUM CHLORIDE 0.9 % IV BOLUS (SEPSIS)
500.0000 mL | Freq: Once | INTRAVENOUS | Status: AC
  Administered 2013-08-21: 500 mL via INTRAVENOUS

## 2013-08-21 MED ORDER — BENZONATATE 100 MG PO CAPS: 200.0000 mg | ORAL_CAPSULE | Freq: Three times a day (TID) | ORAL | Status: DC | PRN

## 2013-08-21 MED ORDER — IPRATROPIUM BROMIDE 0.02 % IN SOLN
0.5000 mg | RESPIRATORY_TRACT | Status: DC
  Administered 2013-08-21 – 2013-08-22 (×6): 0.5 mg via RESPIRATORY_TRACT
  Filled 2013-08-21 (×7): qty 2.5

## 2013-08-21 MED ORDER — POTASSIUM CHLORIDE IN NACL 20-0.9 MEQ/L-% IV SOLN
INTRAVENOUS | Status: DC
  Administered 2013-08-21 – 2013-08-22 (×2): via INTRAVENOUS

## 2013-08-21 MED ORDER — ALPRAZOLAM 1 MG PO TABS
1.0000 mg | ORAL_TABLET | Freq: Three times a day (TID) | ORAL | Status: DC | PRN
  Administered 2013-08-21 – 2013-08-22 (×4): 1 mg via ORAL
  Filled 2013-08-21 (×4): qty 1

## 2013-08-21 MED ORDER — OXYCODONE HCL 5 MG PO TABS
30.0000 mg | ORAL_TABLET | Freq: Four times a day (QID) | ORAL | Status: DC | PRN
  Administered 2013-08-21 – 2013-08-22 (×5): 30 mg via ORAL
  Filled 2013-08-21 (×7): qty 6

## 2013-08-21 MED ORDER — ONDANSETRON HCL 4 MG/2ML IJ SOLN: 4.0000 mg | INTRAMUSCULAR | Status: DC | PRN

## 2013-08-21 MED ORDER — TRAZODONE HCL 50 MG PO TABS: 50.0000 mg | ORAL_TABLET | Freq: Every evening | ORAL | Status: DC | PRN

## 2013-08-21 NOTE — ED Notes (Signed)
Patient oxygen saturation on room air 90%. Patient reports wears 2-2.5 liters via nasal canula at home. Patient placed on 3 liters via nasal canula. Oxygen saturation up to 97%.

## 2013-08-21 NOTE — Progress Notes (Signed)
Patient seen and examined this morning. Continues to wheeze with slight improvement in her shortness of breath. ABG reviewed. Symptoms due to COPD exacerbation. Continue with scheduled Solu-Medrol, scheduled nebs and antibiotics. Counseled strongly on smoking cessation.

## 2013-08-21 NOTE — Care Management Note (Signed)
    Page 1 of 1   08/22/2013     12:24:36 PM   CARE MANAGEMENT NOTE 08/22/2013  Patient:  Tammy Blair, Tammy Blair   Account Number:  [id number]  Date Initiated:  08/21/2013  Documentation initiated by:  Rosemary Holms  Subjective/Objective Assessment:   pt lives at home with her child per chart. Pt in deep sleep. Will followup> On home o2 chronic for COPD     Action/Plan:   Anticipated DC Date:  08/22/2013   Anticipated DC Plan:        DC Planning Services  CM consult      Choice offered to / List presented to:             Status of service:  Completed, signed off Medicare Important Message given?   (If response is "NO", the following Medicare IM given date fields will be blank) Date Medicare IM given:   Date Additional Medicare IM given:    Discharge Disposition:  HOME/SELF CARE  Per UR Regulation:    If discussed at Long Length of Stay Meetings, dates discussed:    Comments:  08/22/13 Rosemary Holms RN BSN CM CM contacted [facility]. Contact person Antony Contras [phone]. LM with Pt's contact infomation and gave pt the agency contact information. Pt needs Hearing Aid.  08/21/13 Rosemarie Galvis RN BSN CM

## 2013-08-21 NOTE — Progress Notes (Signed)
[facility] PROGRESS NOTE  Tammy Blair ION:[id number] DOB: 12-Sep-1951 DOA: 08/21/2013 PCP: Ron Parker, MD  Assessment/Plan: COPD exacerbation: not much improvement this am. Very poor air movement with continued wheeze. Pt appears impaired and medication review shows no altering meds given. Will check ABG. Continue nebs scheduled every 4 hours, solumedrol every 6 hours and antibiotics. sats 92% on 4L. Baseline oxygen dose 2-3L per patient. Monitor closely  Hyperglycemia: likely related to steroids. Pt recently on steroid taper prior to admission. Will use SSI for glycemic control.   HYPERTENSION: controlled. Not on antihypertensive meds. monitor  GAD (generalized anxiety disorder): appears stable at baseline. Home meds include xanax 4 times daily prn. TID prn ordered.    Chronic pain syndrome: mostly low back and legs. Stable at baseline.    Tobacco use disorder : counseled to quit.   Chronic respiratory failure : see #1. On oxygen at home 2-3L with dyspnea walking to BR.   Obesity, unspecified: BMI 31.7. Nutritional consult  Code Status: full Family Communication: none available Disposition Plan: home when ready   Consultants:  none  Procedures:  none  Antibiotics:  Zithromax 08/21/13>>  HPI/Subjective: Sitting up in bed. Appears lethargic. Complains pain in legs. Reports no improvement in respiratory status  Objective: Filed Vitals:   08/21/13 0812  BP: 124/75  Pulse: 84  Temp: 98 F (36.7 C)  Resp: 20    Intake/Output Summary (Last 24 hours) at 08/21/13 0914 Last data filed at 08/21/13 0840  Gross per 24 hour  Intake    370 ml  Output      0 ml  Net    370 ml   Filed Weights   08/21/13 0020 08/21/13 0500  Weight: 97.07 kg (214 lb) 98.2 kg (216 lb 7.9 oz)    Exam:   General:  Obese NAD  Cardiovascular: RRR No MGR No LE edema   Respiratory: mild increased work of breathing with conversation. BS very diminished throughout. Faint  expiratory wheeze diffusely with scattered rhonchi distant no crackles  Abdomen: obese soft +BS non-tender to palpation  Musculoskeletal: no clubbing no cyanosis   Data Reviewed: Basic Metabolic Panel:  Recent Labs Lab 08/21/13 0030 08/21/13 0554  NA 137 139  K 3.5 4.2  CL 96 101  CO2 32 29  GLUCOSE 189* 292*  BUN 9 8  CREATININE 0.89 0.79  CALCIUM 9.6 9.2   Liver Function Tests: No results found for this basename: AST, ALT, ALKPHOS, BILITOT, PROT, ALBUMIN,  in the last 168 hours No results found for this basename: LIPASE, AMYLASE,  in the last 168 hours No results found for this basename: AMMONIA,  in the last 168 hours CBC:  Recent Labs Lab 08/21/13 0030 08/21/13 0554  WBC 10.5 8.7  NEUTROABS 5.8  --   HGB 14.4 13.1  HCT 43.0 39.5  MCV 95.1 94.7  PLT 273 253   Cardiac Enzymes: No results found for this basename: CKTOTAL, CKMB, CKMBINDEX, TROPONINI,  in the last 168 hours BNP (last 3 results) No results found for this basename: PROBNP,  in the last 8760 hours CBG: No results found for this basename: GLUCAP,  in the last 168 hours  No results found for this or any previous visit (from the past 240 hour(s)).   Studies: Dg Chest Port 1 View  08/21/2013   *RADIOLOGY REPORT*  Clinical Data: Shortness of breath and COPD  PORTABLE CHEST - 1 VIEW  Comparison: 03/09/ 2014  Findings: The heart size and mediastinal contours  are within normal limits.  Both lungs are clear.  The visualized skeletal structures are unremarkable.  IMPRESSION: Negative exam.   Original Report Authenticated By: Signa Kell, M.D.    Scheduled Meds: . sodium chloride   Intravenous STAT  . albuterol  2.5 mg Nebulization Q4H WA  . azithromycin  500 mg Intravenous Q24H  . budesonide-formoterol  1 puff Inhalation BID  . enoxaparin (LOVENOX) injection  40 mg Subcutaneous Q24H  . guaiFENesin  1,200 mg Oral BID  . hydrocerin   Topical BID  . ipratropium  0.5 mg Nebulization Q4H WA  .  methylPREDNISolone (SOLU-MEDROL) injection  80 mg Intravenous Q6H   Continuous Infusions: . 0.9 % NaCl with KCl 20 mEq / L 50 mL/hr at 08/21/13 0535    Active Problems:   HYPERTENSION   COPD exacerbation   GAD (generalized anxiety disorder)   Chronic pain syndrome   Tobacco use disorder   Chronic respiratory failure   Obesity, unspecified   Hyperglycemia    Time spent: 35 mintues    [facility] M  [facility] Pager [phone]. If 7PM-7AM, please contact night-coverage at [email or web address], password [facility] 08/21/2013, 9:14 AM  LOS: 0 days

## 2013-08-21 NOTE — ED Provider Notes (Signed)
CSN: [id number]     Arrival date & time 08/21/13  0005 History   First MD Initiated Contact with Patient 08/21/13 0014     Chief Complaint  Patient presents with  . Shortness of Breath   (Consider location/radiation/quality/duration/timing/severity/associated sxs/prior Treatment) HPI.... level 5 caveat for urgent need for intervention.     Patient has known COPD, is on home oxygen, and continues to smoke. She has had a recent COPD exacerbation with increased dyspnea. No fever, chills, rusty sputum. Severity is moderate to severe. Exertion makes symptoms worse.  Past Medical History  Diagnosis Date  . Asthma   . COPD (chronic obstructive pulmonary disease)   . Back pain    Past Surgical History  Procedure Laterality Date  . Cesarean section    . Tonsillectomy     Family History  Problem Relation Age of Onset  . COPD Mother   . Cancer Mother     kidney  . Stroke Brother   . Asthma Sister    History  Substance Use Topics  . Smoking status: Current Every Day Smoker -- 0.50 packs/day for 40 years    Types: Cigarettes  . Smokeless tobacco: Not on file  . Alcohol Use: No   OB History   Grav Para Term Preterm Abortions TAB SAB Ect Mult Living                 Review of Systems  Unable to perform ROS: Acuity of condition    Allergies  Review of patient's allergies indicates no known allergies.  Home Medications   Current Outpatient Rx  Name  Route  Sig  Dispense  Refill  . albuterol (PROAIR HFA) 108 (90 BASE) MCG/ACT inhaler   Inhalation   Inhale 2 puffs into the lungs 4 (four) times daily as needed. For shortness of breath         . albuterol (PROVENTIL) (2.5 MG/3ML) 0.083% nebulizer solution   Nebulization   Take 2.5 mg by nebulization every 4 (four) hours as needed. For shortness of breath         . ALPRAZolam (XANAX) 1 MG tablet   Oral   Take 1 mg by mouth 4 (four) times daily as needed. For anxiety         . budesonide-formoterol (SYMBICORT) 160-4.5  MCG/ACT inhaler   Inhalation   Inhale 1 puff into the lungs 2 (two) times daily.          . cyclobenzaprine (FLEXERIL) 10 MG tablet   Oral   Take 10 mg by mouth 2 (two) times daily as needed. For muscle spasm         . oxycodone (ROXICODONE) 30 MG immediate release tablet   Oral   Take 30 mg by mouth every 6 (six) hours as needed. For severe pain         . PRESCRIPTION MEDICATION   Oral   Take 1 tablet by mouth 2 (two) times daily. For Heartburn. NAME IS UNKNOWN         . azithromycin (ZITHROMAX Z-PAK) 250 MG tablet      Take one tablet po daily until done   6 each   0   . benzonatate (TESSALON) 200 MG capsule   Oral   Take 1 capsule (200 mg total) by mouth 3 (three) times daily as needed for cough.   20 capsule   0   . Dextromethorphan-Guaifenesin (MUCINEX FAST-MAX DM MAX) 5-100 MG/5ML LIQD   Oral   Take 5  mLs by mouth every 6 (six) hours as needed (cough).   1 Bottle   0   . Nicotine 21-14-7 MG/24HR KIT      Place one patch on skin daily, use as directed   1 each   0   . predniSONE (DELTASONE) 10 MG tablet      Take 40mg  po daily for 2 days then 30mg  po daily for 2 days then 20mg  po daily for 2 days then 10mg  po daily for 2 days then stop   20 tablet   0    BP 142/98  Pulse 86  Temp(Src) 98.4 F (36.9 C) (Oral)  Resp 14  Ht 5\' 9"  (1.753 m)  Wt 214 lb (97.07 kg)  BMI 31.59 kg/m2  SpO2 91% Physical Exam  Nursing note and vitals reviewed. Constitutional: She is oriented to person, place, and time. She appears well-developed and well-nourished.  HENT:  Head: Normocephalic and atraumatic.  Eyes: Conjunctivae and EOM are normal. Pupils are equal, round, and reactive to light.  Neck: Normal range of motion. Neck supple.  Cardiovascular: Normal rate, regular rhythm and normal heart sounds.   Pulmonary/Chest: Effort normal.  Bilateral expiratory wheeze  Abdominal: Soft. Bowel sounds are normal.  Musculoskeletal: Normal range of motion.   Neurological: She is alert and oriented to person, place, and time.  Skin: Skin is warm and dry.  Psychiatric: She has a normal mood and affect.    ED Course  Procedures (including critical care time) Labs Review Labs Reviewed  CBC WITH DIFFERENTIAL - Abnormal; Notable for the following:    Eosinophils Relative 14 (*)    Eosinophils Absolute 1.5 (*)    All other components within normal limits  BASIC METABOLIC PANEL - Abnormal; Notable for the following:    Glucose, Bld 189 (*)    GFR calc non Af Amer 69 (*)    GFR calc Af Amer 80 (*)    All other components within normal limits   Imaging Review Dg Chest Port 1 View  08/21/2013   *RADIOLOGY REPORT*  Clinical Data: Shortness of breath and COPD  PORTABLE CHEST - 1 VIEW  Comparison: 03/09/ 2014  Findings: The heart size and mediastinal contours are within normal limits.  Both lungs are clear.  The visualized skeletal structures are unremarkable.  IMPRESSION: Negative exam.   Original Report Authenticated By: Signa Kell, M.D.    MDM   1. COPD exacerbation    Patient has known COPD on home oxygen and continues to smoke. She arrived via EMS extremely dyspneic. Good response to breathing treatments x2 and IV Solu-Medrol. Chest x-ray negative. Admit to general medicine    Donnetta Hutching, MD 08/21/13 0221

## 2013-08-21 NOTE — ED Notes (Signed)
Patient states that she is having trouble breathing. Started having trouble breathing today per pt. States that she is oxygen at home and uses breathing treatments at home also.

## 2013-08-21 NOTE — ED Notes (Signed)
Patient reports breathing feels a little better. Patient still with end expiratory wheezing. 91-92% oxygen saturation on 3 liters of oxygen via nasal canula.

## 2013-08-21 NOTE — Progress Notes (Addendum)
Patient c/o having a cough,Dr Dhungel notified.

## 2013-08-21 NOTE — H&P (Signed)
[facility] History and Physical  Tammy Blair  GNF:[id number]  DOB: [birth year]   DOA: 08/21/2013   PCP:   Ron Parker, MD   Chief Complaint:  Shortness of breath past few days  HPI: Tammy Blair is a 62 y.o. female.   Obese middle-aged Caucasian lady with anxiety disorder and COPD presents with the above symptoms;  She denies fever or chills; denies productive cough In the emergency room she had extensive wheezing and did not respond sufficiently to nebulizations and hospitalist service was called to assist.  Reports itchy rash around the waist and the front of the legs  Rewiew of Systems:    All systems negative except as marked bold or noted in the HPI;  Constitutional:    malaise, fever and chills. ;  Eyes:   eye pain, redness and discharge. ;  ENMT:   ear pain, hoarseness, nasal congestion, sinus pressure and sore throat. ;  Cardiovascular:    chest pain, palpitations, diaphoresis, dyspnea and peripheral edema.  Respiratory:   cough, hemoptysis, wheezing and stridor. ;  Gastrointestinal:  nausea, vomiting, diarrhea, constipation, abdominal pain, melena, blood in stool, hematemesis, jaundice and rectal bleeding. unusual weight loss..   Genitourinary:    frequency, dysuria, incontinence,flank pain and hematuria; Musculoskeletal:   back pain and neck pain.  swelling and trauma.;  Neuro:    headache, lightheadedness and neck stiffness.  weakness, altered level of consciousness, altered mental status, extremity weakness, burning feet, involuntary movement, seizure and syncope.  Psych:    anxiety, depression, insomnia, tearfulness, panic attacks, hallucinations, paranoia, suicidal or homicidal ideation    Past Medical History  Diagnosis Date  . Asthma   . COPD (chronic obstructive pulmonary disease)   . Back pain     Past Surgical History  Procedure Laterality Date  . Cesarean section    . Tonsillectomy      Medications:  HOME MEDS: Prior to Admission  medications   Medication Sig Start Date End Date Taking? Authorizing Provider  albuterol (PROAIR HFA) 108 (90 BASE) MCG/ACT inhaler Inhale 2 puffs into the lungs 4 (four) times daily as needed. For shortness of breath   Yes Historical Provider, MD  albuterol (PROVENTIL) (2.5 MG/3ML) 0.083% nebulizer solution Take 2.5 mg by nebulization every 4 (four) hours as needed. For shortness of breath   Yes Historical Provider, MD  ALPRAZolam Prudy Feeler) 1 MG tablet Take 1 mg by mouth 4 (four) times daily as needed. For anxiety   Yes Historical Provider, MD  budesonide-formoterol (SYMBICORT) 160-4.5 MCG/ACT inhaler Inhale 1 puff into the lungs 2 (two) times daily.    Yes Historical Provider, MD  cyclobenzaprine (FLEXERIL) 10 MG tablet Take 10 mg by mouth 2 (two) times daily as needed. For muscle spasm   Yes Historical Provider, MD  oxycodone (ROXICODONE) 30 MG immediate release tablet Take 30 mg by mouth every 6 (six) hours as needed. For severe pain   Yes Historical Provider, MD  PRESCRIPTION MEDICATION Take 1 tablet by mouth 2 (two) times daily. For Heartburn. NAME IS UNKNOWN   Yes Historical Provider, MD  benzonatate (TESSALON) 200 MG capsule Take 1 capsule (200 mg total) by mouth 3 (three) times daily as needed for cough. 02/19/13   Erick Blinks, MD     Allergies:  No Known Allergies  Social History:   reports that she has been smoking Cigarettes.  She has a 20 pack-year smoking history. She does not have any smokeless tobacco history on file. She reports that she  does not drink alcohol or use illicit drugs.  Family History: Family History  Problem Relation Age of Onset  . COPD Mother   . Cancer Mother     kidney  . Stroke Brother   . Asthma Sister      Physical Exam: Filed Vitals:   08/21/13 0141 08/21/13 0200 08/21/13 0215 08/21/13 0348  BP:  142/98  123/74  Pulse:  94 86 89  Temp:    97.1 F (36.2 C)  TempSrc:    Oral  Resp:  21 14 20   Height:      Weight:      SpO2: 92% 93% 91% 95%    Blood pressure 123/74, pulse 89, temperature 97.1 F (36.2 C), temperature source Oral, resp. rate 20, height 5\' 9"  (1.753 m), weight 97.07 kg (214 lb), SpO2 95.00%. Body mass index is 31.59 kg/(m^2).   GEN:  Pleasant obese middle-age Caucasian lady lying bed; looks older than stated age cooperative with exam PSYCH:  alert and oriented x4;  a little anxious; affect is appropriate. HEENT: Mucous membranes pink and anicteric; PERRLA; EOM intact; no cervical lymphadenopathy nor thyromegaly or carotid bruit; no JVD; Breasts:: Not examined CHEST WALL: No tenderness CHEST: Normal respiration, bilateral diffuse rhonchi HEART: Regular rate and rhythm; no murmurs rubs or gallops BACK: Mild kyphosis no scoliosis; no CVA tenderness ABDOMEN: Obese, soft non-tender; no masses, no organomegaly, normal abdominal bowel sounds; moderate pannus; no intertriginous candida. Rectal Exam: Not done EXTREMITIES:  age-appropriate arthropathy of the hands and knees; no edema; no ulcerations. Genitalia: not examined PULSES: 2+ and symmetric SKIN: Band of erythematous macules and plaques around anterior waist; well marginated; eczematous type rash on anterior shins. CNS: Cranial nerves 2-12 grossly intact no focal lateralizing neurologic deficit   Labs on Admission:  Basic Metabolic Panel:  Recent Labs Lab 08/21/13 0030  NA 137  K 3.5  CL 96  CO2 32  GLUCOSE 189*  BUN 9  CREATININE 0.89  CALCIUM 9.6   Liver Function Tests: No results found for this basename: AST, ALT, ALKPHOS, BILITOT, PROT, ALBUMIN,  in the last 168 hours No results found for this basename: LIPASE, AMYLASE,  in the last 168 hours No results found for this basename: AMMONIA,  in the last 168 hours CBC:  Recent Labs Lab 08/21/13 0030  WBC 10.5  NEUTROABS 5.8  HGB 14.4  HCT 43.0  MCV 95.1  PLT 273   Cardiac Enzymes: No results found for this basename: CKTOTAL, CKMB, CKMBINDEX, TROPONINI,  in the last 168 hours BNP: No  components found with this basename: POCBNP,  D-dimer: No components found with this basename: D-DIMER,  CBG: No results found for this basename: GLUCAP,  in the last 168 hours  Radiological Exams on Admission: Dg Chest Port 1 View  08/21/2013   *RADIOLOGY REPORT*  Clinical Data: Shortness of breath and COPD  PORTABLE CHEST - 1 VIEW  Comparison: 03/09/ 2014  Findings: The heart size and mediastinal contours are within normal limits.  Both lungs are clear.  The visualized skeletal structures are unremarkable.  IMPRESSION: Negative exam.   Original Report Authenticated By: Signa Kell, M.D.      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  Code Status: Full code Family Communication: No family at bedside Disposition Plan: Depending on hospital course    Shailey Butterbaugh Nocturnist [facility] Pager [phone]   08/21/2013, 4:55 AM

## 2013-08-22 DIAGNOSIS — H9190 Unspecified hearing loss, unspecified ear: Secondary | ICD-10-CM | POA: Diagnosis present

## 2013-08-22 DIAGNOSIS — J9610 Chronic respiratory failure, unspecified whether with hypoxia or hypercapnia: Secondary | ICD-10-CM

## 2013-08-22 DIAGNOSIS — I10 Essential (primary) hypertension: Secondary | ICD-10-CM

## 2013-08-22 DIAGNOSIS — F17200 Nicotine dependence, unspecified, uncomplicated: Secondary | ICD-10-CM

## 2013-08-22 LAB — GLUCOSE, CAPILLARY: Glucose-Capillary: 271 mg/dL — ABNORMAL HIGH (ref 70–99)

## 2013-08-22 MED ORDER — HYDROCERIN EX CREA: 1.0000 "application " | TOPICAL_CREAM | Freq: Two times a day (BID) | CUTANEOUS | Status: DC

## 2013-08-22 MED ORDER — HYDROCOD POLST-CHLORPHEN POLST 10-8 MG/5ML PO LQCR: 5.0000 mL | Freq: Two times a day (BID) | ORAL | Status: DC | PRN

## 2013-08-22 MED ORDER — INSULIN ASPART 100 UNIT/ML ~~LOC~~ SOLN
3.0000 [IU] | Freq: Three times a day (TID) | SUBCUTANEOUS | Status: DC
  Administered 2013-08-22: 3 [IU] via SUBCUTANEOUS

## 2013-08-22 MED ORDER — INSULIN ASPART 100 UNIT/ML ~~LOC~~ SOLN: 0.0000 [IU] | Freq: Every day | SUBCUTANEOUS | Status: DC

## 2013-08-22 MED ORDER — INSULIN ASPART 100 UNIT/ML ~~LOC~~ SOLN
0.0000 [IU] | Freq: Three times a day (TID) | SUBCUTANEOUS | Status: DC
  Administered 2013-08-22: 11 [IU] via SUBCUTANEOUS

## 2013-08-22 MED ORDER — BISACODYL 5 MG PO TBEC: 5.0000 mg | DELAYED_RELEASE_TABLET | Freq: Every day | ORAL | Status: DC | PRN

## 2013-08-22 MED ORDER — AZITHROMYCIN 250 MG PO TABS: ORAL_TABLET | ORAL | Status: AC

## 2013-08-22 MED ORDER — NICOTINE 14 MG/24HR TD PT24: 1.0000 | MEDICATED_PATCH | Freq: Every day | TRANSDERMAL | Status: DC

## 2013-08-22 MED ORDER — PREDNISONE 10 MG PO TABS: ORAL_TABLET | ORAL | Status: DC

## 2013-08-22 MED ORDER — OXYCODONE HCL 30 MG PO TABS: 30.0000 mg | ORAL_TABLET | Freq: Four times a day (QID) | ORAL | Status: DC | PRN

## 2013-08-22 MED ORDER — NICOTINE 14 MG/24HR TD PT24
14.0000 mg | MEDICATED_PATCH | Freq: Every day | TRANSDERMAL | Status: DC
  Administered 2013-08-22: 14 mg via TRANSDERMAL
  Filled 2013-08-22: qty 1

## 2013-08-22 NOTE — Plan of Care (Signed)
Problem: Discharge Progression Outcomes Goal: Activity appropriate for discharge plan Outcome: Completed/Met Date Met:  08/22/13 Ambulatory with O2 Goal: Other Discharge Outcomes/Goals Outcome: Completed/Met Date Met:  08/22/13 Discharged to home with family and O2

## 2013-08-22 NOTE — Progress Notes (Signed)
Utilization Review Complete  

## 2013-08-22 NOTE — Discharge Summary (Signed)
Patient seen and examined. Her shortness of breath has improved with clear lung exam to auscultation. Patient stable to be discharged home on tapering dose of prednisone and 5 more days of oral antibiotic as outlined above. She will followup with her PCP in one week. Counseled strongly on smoking cessation.

## 2013-08-22 NOTE — Discharge Summary (Signed)
Physician Discharge Summary  Tammy Blair JXB:[id number] DOB: 06-02-1951 DOA: 08/21/2013  PCP: Ron Parker, MD  Admit date: 08/21/2013 Discharge date: 08/22/2013  Time spent: 40 minutes  Recommendations for Outpatient Follow-up:  Pt to follow up with Dr Della Goo her PCP in 1 week for evaluation of symptoms Continue home oxygen  Discharge Diagnoses:  Active Problems:   HYPERTENSION   COPD exacerbation   GAD (generalized anxiety disorder)   Chronic pain syndrome   Tobacco use disorder   Chronic respiratory failure   Obesity, unspecified   Hyperglycemia   HOH (hard of hearing)   Discharge Condition: stable  Diet recommendation: regular  Filed Weights   08/21/13 0020 08/21/13 0500 08/22/13 0538  Weight: 97.07 kg (214 lb) 98.2 kg (216 lb 7.9 oz) 102.558 kg (226 lb 1.6 oz)    History of present illness:  Tammy Blair is a 62 y.o. female obese  with anxiety disorder and COPD presented to ED on 08/21/13 with CC of shortness of breath. She denied fever or chills; denied productive cough. In the emergency room she had extensive wheezing and did not respond sufficiently to nebulizations and hospitalist service was called to assist.   Hospital Course:  COPD exacerbation: admitted to floor and provided with scheduled nebs, solumedrol and IV antibiotics. On the day of admission ABG yielded ph 7.32, CO2 54.5, O2 74.6 and bicarb 27.5. She slowly improved and at discharge is at baseline. Sats >90% on 3L. Will be discharged with slow prednisone taper and 5 days antibiotics.  Recommend follow up with PCP 1 week for evaluation of symptoms.   Hyperglycemia: likely related to steroids. Expect will resolve as steroids tappered.   HYPERTENSION: controlled. Not on antihypertensive meds.    GAD (generalized anxiety disorder): appears stable at baseline. Home meds include xanax 4 times daily prn. TID prn ordered.   Chronic pain syndrome: mostly low back and legs. Stable at baseline.    Tobacco use disorder : counseled to quit. Nicotine patch provided.  Chronic respiratory failure : see #1. On oxygen at home 2-3L with dyspnea walking to BR.   Obesity, unspecified: BMI 31.7. 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 Amy Salomon Mast case manager gave pt contact information for [facility] for assistance. Pt reports not able to get hearing aids as MEDICAID does not cover.      Procedures:    Consultations:  none  Discharge Exam: Filed Vitals:   08/22/13 0939  BP: 145/78  Pulse: 100  Temp: 98 F (36.7 C)  Resp: 20    General: obese, somewhat anxious appearing Very HOH NAD Cardiovascular: RRR No MGR No LE edema Respiratory: mild increased work of breathing with eating and conversation. Improved air movement. Faint expiratory wheeze. No crackles Abdomen: obese, soft +BS non-tender to palpation  Discharge Instructions       Future Appointments Provider Department Dept Phone   09/06/2013 12:00 PM Tilda Burrow, MD [facility] [phone]       Medication List    STOP taking these medications       Dextromethorphan-Guaifenesin 5-100 MG/5ML Liqd  Commonly known as:  MUCINEX FAST-MAX DM MAX     Nicotine 21-14-7 MG/24HR Kit  Replaced by:  nicotine 14 mg/24hr patch      TAKE these medications       ALPRAZolam 1 MG tablet  Commonly known as:  XANAX  Take 1 mg by mouth 4 (four) times daily as needed. For anxiety     azithromycin 250 MG tablet  Commonly known as:  ZITHROMAX  Take 1 tab daily for 5 days     bisacodyl 5 MG EC tablet  Commonly known as:  DULCOLAX  Take 1 tablet (5 mg total) by mouth daily as needed.     budesonide-formoterol 160-4.5 MCG/ACT inhaler  Commonly known as:  SYMBICORT  Inhale 1 puff into the lungs 2 (two) times daily.     chlorpheniramine-HYDROcodone 10-8 MG/5ML Lqcr  Commonly known  as:  TUSSIONEX  Take 5 mLs by mouth every 12 (twelve) hours as needed.     cyclobenzaprine 10 MG tablet  Commonly known as:  FLEXERIL  Take 10 mg by mouth 2 (two) times daily as needed. For muscle spasm     hydrocerin Crea  Apply 1 application topically 2 (two) times daily.     multivitamin with minerals Tabs tablet  Take 1 tablet by mouth daily.     nicotine 14 mg/24hr patch  Commonly known as:  NICODERM CQ - dosed in mg/24 hours  Place 1 patch onto the skin daily.     oxycodone 30 MG immediate release tablet  Commonly known as:  ROXICODONE  Take 30 mg by mouth every 6 (six) hours as needed. For severe pain     oxycodone 30 MG immediate release tablet  Commonly known as:  ROXICODONE  Take 1 tablet (30 mg total) by mouth every 6 (six) hours as needed for pain.     predniSONE 10 MG tablet  Commonly known as:  DELTASONE  Take 4 tabs on 9/12, 9/13 and 9/14 then take 3 tabs on 9/15, 9/16 and 9/17 then take 2 tabs on 9/18, 9/19 and 9/20 then take 1 tab on 9/21, 9/22, 9/23 and 9/24 then stop.     PROAIR HFA 108 (90 BASE) MCG/ACT inhaler  Generic drug:  albuterol  Inhale 2 puffs into the lungs 4 (four) times daily as needed. For shortness of breath     albuterol (2.5 MG/3ML) 0.083% nebulizer solution  Commonly known as:  PROVENTIL  Take 2.5 mg by nebulization every 4 (four) hours as needed. For shortness of breath     ranitidine 150 MG tablet  Commonly known as:  ZANTAC  Take 150 mg by mouth 2 (two) times daily.       No Known Allergies Follow-up Information   Follow up with Ron Parker, MD. Schedule an appointment as soon as possible for a visit in 1 week. (evaluation of symptoms)    Specialty:  Internal Medicine   Contact information:   [address] [place] [address] [phone]        The results of significant diagnostics from this hospitalization (including imaging, microbiology, ancillary and laboratory) are listed below for  reference.    Significant Diagnostic Studies: Dg Chest Port 1 View  08/21/2013   *RADIOLOGY REPORT*  Clinical Data: Shortness of breath and COPD  PORTABLE CHEST - 1 VIEW  Comparison: 03/09/ 2014  Findings: The heart size and mediastinal contours are within normal limits.  Both lungs are clear.  The visualized skeletal structures are unremarkable.  IMPRESSION: Negative exam.   Original Report Authenticated By: Signa Kell, M.D.    Microbiology: No results found for this or any previous visit (from the past 240 hour(s)).   Labs: Basic Metabolic Panel:  Recent Labs Lab 08/21/13  0030 08/21/13 0554  NA 137 139  K 3.5 4.2  CL 96 101  CO2 32 29  GLUCOSE 189* 292*  BUN 9 8  CREATININE 0.89 0.79  CALCIUM 9.6 9.2   Liver Function Tests: No results found for this basename: AST, ALT, ALKPHOS, BILITOT, PROT, ALBUMIN,  in the last 168 hours No results found for this basename: LIPASE, AMYLASE,  in the last 168 hours No results found for this basename: AMMONIA,  in the last 168 hours CBC:  Recent Labs Lab 08/21/13 0030 08/21/13 0554  WBC 10.5 8.7  NEUTROABS 5.8  --   HGB 14.4 13.1  HCT 43.0 39.5  MCV 95.1 94.7  PLT 273 253   Cardiac Enzymes: No results found for this basename: CKTOTAL, CKMB, CKMBINDEX, TROPONINI,  in the last 168 hours BNP: BNP (last 3 results) No results found for this basename: PROBNP,  in the last 8760 hours CBG:  Recent Labs Lab 08/21/13 1219 08/21/13 1647 08/21/13 2052 08/22/13 0802 08/22/13 1138  GLUCAP 231* 271* 348* 253* 271*       Signed:  Toya Smothers M  [facility] 08/22/2013, 11:58 AM

## 2013-08-22 NOTE — Progress Notes (Signed)
Inpatient Diabetes Program Recommendations  AACE/ADA: New Consensus Statement on Inpatient Glycemic Control (2013)  Target Ranges:  Prepandial:   less than 140 mg/dL      Peak postprandial:   less than 180 mg/dL (1-2 hours)      Critically ill patients:  140 - 180 mg/dL  Results for ORRGerldine, Suleiman (MRN [id number]) as of 08/22/2013 07:59  Ref. Range 08/21/2013 05:54  Hemoglobin A1C Latest Range: <5.7 % 6.3 (H)  Glucose Latest Range: 70-99 mg/dL 409 (H)   Results for ORRPang, Robers (MRN [id number]) as of 08/22/2013 07:59  Ref. Range 08/21/2013 12:19 08/21/2013 16:47 08/21/2013 20:52  Glucose-Capillary Latest Range: 70-99 mg/dL 956 (H) 213 (H) 086 (H)    Inpatient Diabetes Program Recommendations Correction (SSI): Please increase Novolog correction to Resistant scale while inpatient and on steroids.  Also, please add Novolog bedtime correction. Insulin - Meal Coverage: Please consider ordering Novolog 3 units TID with meals for meal coverage while receiving steroids.  Note:  A1C noted to be 6.3% on 08/21/13.  Hyperglycemic related to steroids.  Currently patient is receiving Solumedrol 80 mg Q6H.  Please increase Novolog correction to resistant scale and add Novolog 3 units TID with meals for meal coverage.  Will continue to follow.  Thanks, Orlando Penner, RN, MSN, CCRN Diabetes Coordinator Inpatient Diabetes Program [phone] (Team Pager) [phone] ([facility] office) [phone] [facility] office)

## 2013-08-22 NOTE — Plan of Care (Signed)
Problem: Discharge Progression Outcomes Goal: O2 sats > or equal 90% or at baseline Outcome: Completed/Met Date Met:  08/22/13 3l O2 chronic Goal: Home O2 if indicated Outcome: Completed/Met Date Met:  08/22/13 Pt has been O2 dependent prior to admission Goal: Pneumonia vaccine received if indicated Outcome: Completed/Met Date Met:  08/22/13 10/2012 Goal: Complications resolved/controlled Outcome: Completed/Met Date Met:  08/22/13 Pt dyspnea is controlled

## 2013-09-06 ENCOUNTER — Other Ambulatory Visit: Payer: Self-pay | Admitting: Obstetrics and Gynecology

## 2013-09-26 ENCOUNTER — Other Ambulatory Visit: Payer: Self-pay | Admitting: Obstetrics and Gynecology

## 2013-10-17 ENCOUNTER — Other Ambulatory Visit: Payer: Self-pay | Admitting: Obstetrics and Gynecology

## 2013-10-28 ENCOUNTER — Other Ambulatory Visit: Payer: Self-pay | Admitting: Obstetrics and Gynecology

## 2013-11-27 ENCOUNTER — Emergency Department (HOSPITAL_COMMUNITY)
Admission: EM | Admit: 2013-11-27 | Discharge: 2013-11-28 | Disposition: A | Discharge status: Home / Self Care | Payer: Medicaid Other | Attending: Emergency Medicine | Admitting: Emergency Medicine

## 2013-11-27 ENCOUNTER — Encounter (HOSPITAL_COMMUNITY): Payer: Self-pay | Admitting: Emergency Medicine

## 2013-11-27 ENCOUNTER — Emergency Department (HOSPITAL_COMMUNITY): Payer: Medicaid Other

## 2013-11-27 DIAGNOSIS — G8929 Other chronic pain: Secondary | ICD-10-CM | POA: Insufficient documentation

## 2013-11-27 DIAGNOSIS — IMO0002 Reserved for concepts with insufficient information to code with codable children: Secondary | ICD-10-CM | POA: Insufficient documentation

## 2013-11-27 DIAGNOSIS — M545 Low back pain, unspecified: Secondary | ICD-10-CM | POA: Insufficient documentation

## 2013-11-27 DIAGNOSIS — Z9981 Dependence on supplemental oxygen: Secondary | ICD-10-CM | POA: Insufficient documentation

## 2013-11-27 DIAGNOSIS — J441 Chronic obstructive pulmonary disease with (acute) exacerbation: Secondary | ICD-10-CM | POA: Insufficient documentation

## 2013-11-27 DIAGNOSIS — M25569 Pain in unspecified knee: Secondary | ICD-10-CM | POA: Insufficient documentation

## 2013-11-27 DIAGNOSIS — F17200 Nicotine dependence, unspecified, uncomplicated: Secondary | ICD-10-CM | POA: Insufficient documentation

## 2013-11-27 DIAGNOSIS — Z79899 Other long term (current) drug therapy: Secondary | ICD-10-CM | POA: Insufficient documentation

## 2013-11-27 DIAGNOSIS — R1110 Vomiting, unspecified: Secondary | ICD-10-CM | POA: Insufficient documentation

## 2013-11-27 LAB — COMPREHENSIVE METABOLIC PANEL
AST: 26 U/L (ref 0–37)
Albumin: 4 g/dL (ref 3.5–5.2)
Alkaline Phosphatase: 62 U/L (ref 39–117)
BUN: 10 mg/dL (ref 6–23)
CO2: 28 mEq/L (ref 19–32)
Chloride: 99 mEq/L (ref 96–112)
Creatinine, Ser: 0.95 mg/dL (ref 0.50–1.10)
GFR calc non Af Amer: 64 mL/min — ABNORMAL LOW (ref >90)
Potassium: 3.5 mEq/L (ref 3.5–5.1)
Sodium: 138 mEq/L (ref 135–145)
Total Protein: 7.8 g/dL (ref 6.0–8.3)

## 2013-11-27 LAB — CBC WITH DIFFERENTIAL/PLATELET
Basophils Absolute: 0 10*3/uL (ref 0.0–0.1)
Basophils Relative: 0 % (ref 0–1)
Eosinophils Absolute: 0.6 10*3/uL (ref 0.0–0.7)
Lymphs Abs: 2.5 10*3/uL (ref 0.7–4.0)
MCH: 31.8 pg (ref 26.0–34.0)
MCHC: 33.6 g/dL (ref 30.0–36.0)
MCV: 94.8 fL (ref 78.0–100.0)
Monocytes Relative: 8 % (ref 3–12)
Neutro Abs: 5.2 10*3/uL (ref 1.7–7.7)
Neutrophils Relative %: 58 % (ref 43–77)
Platelets: 204 10*3/uL (ref 150–400)
RDW: 13.5 % (ref 11.5–15.5)

## 2013-11-27 LAB — BLOOD GAS, ARTERIAL
Drawn by: 22223
O2 Content: 2 L/min
O2 Saturation: 96.9 %
Patient temperature: 37
pH, Arterial: 7.364 (ref 7.350–7.450)
pO2, Arterial: 95.7 mmHg (ref 80.0–100.0)

## 2013-11-27 LAB — PRO B NATRIURETIC PEPTIDE: Pro B Natriuretic peptide (BNP): 36.3 pg/mL (ref 0–125)

## 2013-11-27 LAB — TROPONIN I: Troponin I: <0.3 ng/mL (ref <0.30)

## 2013-11-27 IMAGING — CR DG CHEST 2V
2 series · 2 of 2 positions shown · non-contrast
Comparison: [DATE]

CLINICAL DATA: Cough.  Shortness of breath.

EXAM:
CHEST  2 VIEW

[view not recorded (1 of 2)]
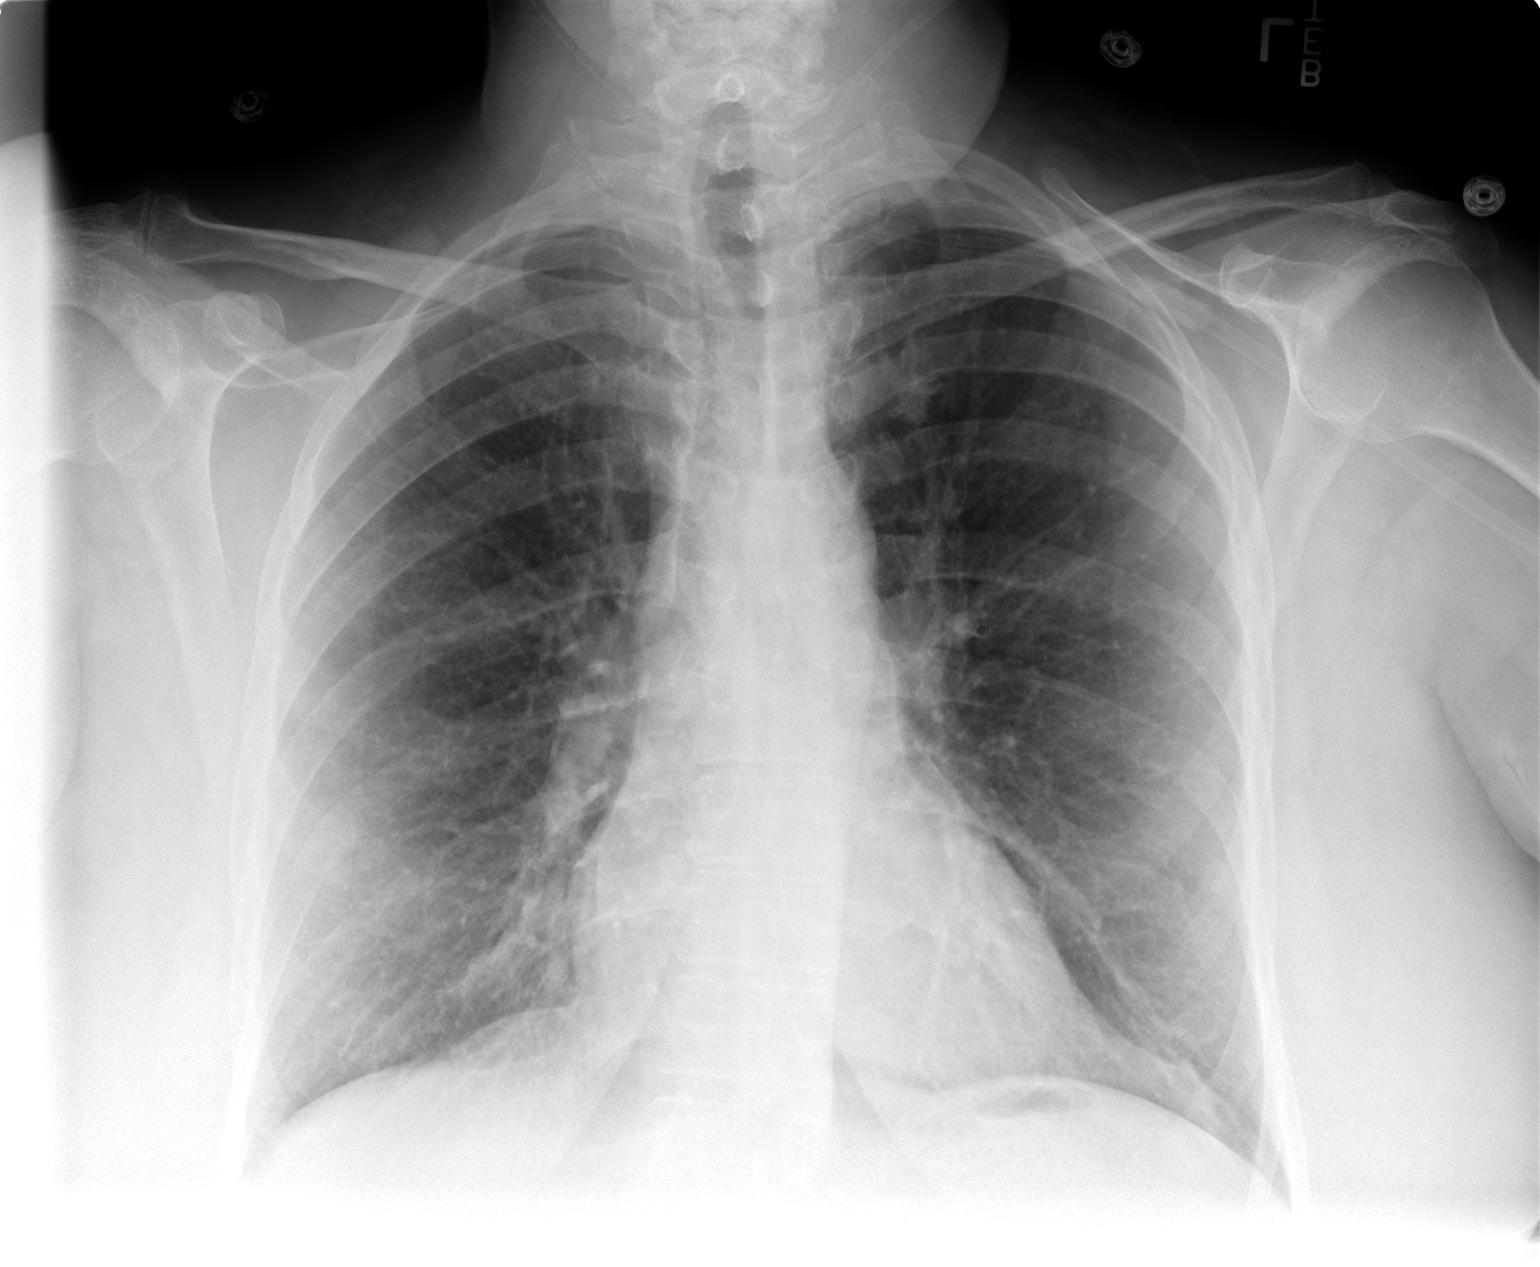

[view not recorded (2 of 2)]
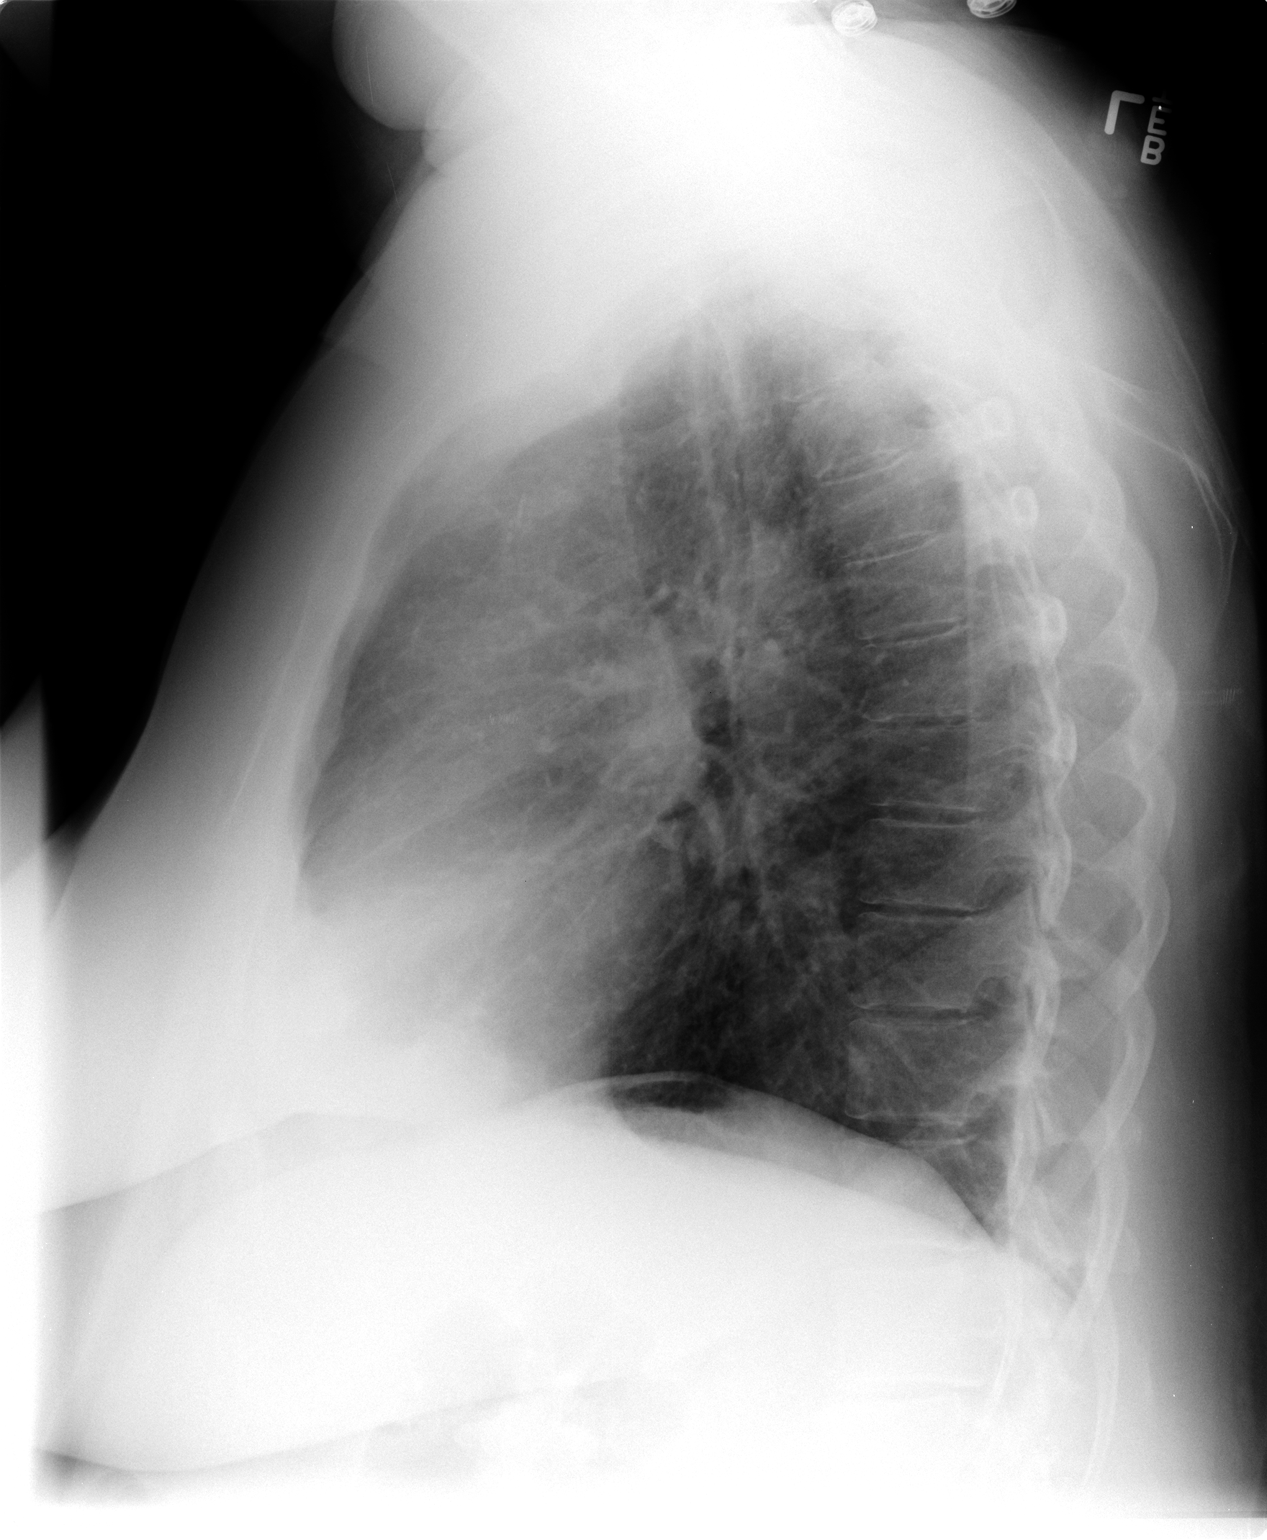

[2 of 2 positions shown; findings below may reference images not displayed]

FINDINGS: The heart size and mediastinal contours are within normal limits. A
small left suprahilar density is most likely from the 1st
costochondral junction - seen also on priors. Both lungs are clear,
mildly hyperinflated. The visualized skeletal structures are
unremarkable.
IMPRESSION: No active cardiopulmonary disease.

## 2013-11-27 MED ORDER — ALBUTEROL SULFATE (5 MG/ML) 0.5% IN NEBU
5.0000 mg | INHALATION_SOLUTION | Freq: Once | RESPIRATORY_TRACT | Status: AC
  Administered 2013-11-27: 5 mg via RESPIRATORY_TRACT
  Filled 2013-11-27: qty 1

## 2013-11-27 MED ORDER — IPRATROPIUM BROMIDE 0.02 % IN SOLN
0.5000 mg | Freq: Once | RESPIRATORY_TRACT | Status: AC
  Administered 2013-11-27: 0.5 mg via RESPIRATORY_TRACT
  Filled 2013-11-27: qty 2.5

## 2013-11-27 MED ORDER — PREDNISONE 50 MG PO TABS: ORAL_TABLET | ORAL | Status: DC

## 2013-11-27 MED ORDER — HYDROCODONE-ACETAMINOPHEN 5-325 MG PO TABS
2.0000 | ORAL_TABLET | Freq: Once | ORAL | Status: AC
Start: 2013-11-27 — End: 2013-11-27
  Administered 2013-11-27: 2 via ORAL
  Filled 2013-11-27: qty 2

## 2013-11-27 MED ORDER — PREDNISONE 10 MG PO TABS
60.0000 mg | ORAL_TABLET | Freq: Once | ORAL | Status: AC
  Administered 2013-11-27: 22:00:00 60 mg via ORAL
  Filled 2013-11-27 (×2): qty 1

## 2013-11-27 MED ORDER — ALBUTEROL SULFATE HFA 108 (90 BASE) MCG/ACT IN AERS: 2.0000 | INHALATION_SPRAY | Freq: Four times a day (QID) | RESPIRATORY_TRACT | Status: DC | PRN

## 2013-11-27 MED ORDER — DOXYCYCLINE HYCLATE 100 MG PO CAPS: 100.0000 mg | ORAL_CAPSULE | Freq: Two times a day (BID) | ORAL | Status: DC

## 2013-11-27 NOTE — ED Notes (Signed)
I have been feeling bad for 2 days per pt. I am short of breath, having back and leg pain per pt. I have been using my oxygen and nebulizer per pt. I have used my inhalers also per pt.

## 2013-11-27 NOTE — ED Provider Notes (Signed)
CSN: [id number]     Arrival date & time 11/27/13  2139 History  This chart was scribed for Glynn Octave, MD by Danella Maiers, ED Scribe. This patient was seen in room APA19/APA19 and the patient's care was started at 9:47 PM.   Chief Complaint  Patient presents with  . Shortness of Breath  . Back Pain  . Leg Pain   The history is provided by the patient. No language interpreter was used.   HPI Comments: Tammy Blair is a 62 y.o. female with a h/o COPD who presents to the Emergency Department complaining of SOB onset 2 days ago. She is on 3L home oxygen all the time. She went up from 2L to 3L one month ago. The last time she was hospitalized for COPD was 3-4 months ago. She reports one episode emesis after eating [employer] yesterday. Pt states she has been using her nebulizer and inhaler. She denies h/o DM, heart problems, heart failure. She denies cough, fever, abdominal pain, CP. She had her flu shot this year.  She also reports chronic, non-radiating, left lower back pain from a slipped disc years ago. She states the back pain is unchanged from what she has had for years. She denies urinary or bowel incontinence. She also reports bilateral leg pain and right knee pain that causes her to need to walk with a walker sometimes.   PCP- Dr Della Goo   Past Medical History  Diagnosis Date  . Asthma   . COPD (chronic obstructive pulmonary disease)   . Back pain    Past Surgical History  Procedure Laterality Date  . Cesarean section    . Tonsillectomy     Family History  Problem Relation Age of Onset  . COPD Mother   . Cancer Mother     kidney  . Stroke Brother   . Asthma Sister    History  Substance Use Topics  . Smoking status: Current Every Day Smoker -- 0.50 packs/day for 40 years    Types: Cigarettes  . Smokeless tobacco: Not on file  . Alcohol Use: No   OB History   Grav Para Term Preterm Abortions TAB SAB Ect Mult Living                 Review of  Systems  Constitutional: Negative for fever.  Respiratory: Positive for shortness of breath. Negative for cough.   Cardiovascular: Negative for chest pain.  Gastrointestinal: Positive for vomiting. Negative for abdominal pain.  Musculoskeletal: Positive for back pain.   A complete 10 system review of systems was obtained and all systems are negative except as noted in the HPI and PMH.   Allergies  Review of patient's allergies indicates no known allergies.  Home Medications   Current Outpatient Rx  Name  Route  Sig  Dispense  Refill  . albuterol (PROAIR HFA) 108 (90 BASE) MCG/ACT inhaler   Inhalation   Inhale 2 puffs into the lungs 4 (four) times daily as needed. For shortness of breath         . albuterol (PROVENTIL) (2.5 MG/3ML) 0.083% nebulizer solution   Nebulization   Take 2.5 mg by nebulization every 4 (four) hours as needed. For shortness of breath         . ALPRAZolam (XANAX) 1 MG tablet   Oral   Take 1 mg by mouth 4 (four) times daily as needed. For anxiety         . bisacodyl (DULCOLAX) 5 MG  EC tablet   Oral   Take 1 tablet (5 mg total) by mouth daily as needed.   30 tablet   0   . budesonide-formoterol (SYMBICORT) 160-4.5 MCG/ACT inhaler   Inhalation   Inhale 1 puff into the lungs 2 (two) times daily.          . cyclobenzaprine (FLEXERIL) 10 MG tablet   Oral   Take 10 mg by mouth 2 (two) times daily as needed. For muscle spasm         . metoprolol (LOPRESSOR) 50 MG tablet   Oral   Take 50 mg by mouth 2 (two) times daily.         Marland Kitchen oxyCODONE (ROXICODONE) 15 MG immediate release tablet   Oral   Take 15 mg by mouth every 6 (six) hours as needed for pain.         Marland Kitchen oxycodone (ROXICODONE) 30 MG immediate release tablet   Oral   Take 30 mg by mouth every 6 (six) hours as needed. For severe pain         . ranitidine (ZANTAC) 150 MG tablet   Oral   Take 150 mg by mouth 2 (two) times daily.         Marland Kitchen tiotropium (SPIRIVA) 18 MCG inhalation  capsule   Inhalation   Place 18 mcg into inhaler and inhale daily.         Marland Kitchen albuterol (PROVENTIL HFA;VENTOLIN HFA) 108 (90 BASE) MCG/ACT inhaler   Inhalation   Inhale 2 puffs into the lungs every 6 (six) hours as needed for wheezing or shortness of breath.   1 Inhaler   2   . doxycycline (VIBRAMYCIN) 100 MG capsule   Oral   Take 1 capsule (100 mg total) by mouth 2 (two) times daily.   20 capsule   0   . predniSONE (DELTASONE) 50 MG tablet      1 tablet PO daily   5 tablet   0    BP 141/79  Pulse 99  Temp(Src) 97.8 F (36.6 C) (Oral)  Resp 20  Ht 5\' 6"  (1.676 m)  Wt 213 lb (96.616 kg)  BMI 34.40 kg/m2  SpO2 96% Physical Exam  Nursing note and vitals reviewed. Constitutional: She is oriented to person, place, and time. She appears well-developed and well-nourished. No distress.  Speaking in full sentences.   HENT:  Head: Normocephalic and atraumatic.  Eyes: EOM are normal.  Neck: Neck supple. No tracheal deviation present.  Cardiovascular: Normal rate and regular rhythm.   No leg swelling  Pulmonary/Chest: No respiratory distress. She has wheezes.  Moderate air exchange with scattered expiratroy wheezing.  Musculoskeletal: Normal range of motion.  5/5 strength in bilateral lower extremities. Ankle plantar and dorsiflexion intact. Great toe extension intact bilaterally. +2 DP and PT pulses. +2 patellar reflexes bilaterally. Normal gait. Lumbar spinal tenderness.   Neurological: She is alert and oriented to person, place, and time.  Skin: Skin is warm and dry.  Psychiatric: She has a normal mood and affect. Her behavior is normal.    ED Course  Procedures (including critical care time) Medications  albuterol (PROVENTIL) (5 MG/ML) 0.5% nebulizer solution 5 mg (5 mg Nebulization Given 11/27/13 2217)  ipratropium (ATROVENT) nebulizer solution 0.5 mg (0.5 mg Nebulization Given 11/27/13 2217)  predniSONE (DELTASONE) tablet 60 mg (60 mg Oral Given 11/27/13 2227)   HYDROcodone-acetaminophen (NORCO/VICODIN) 5-325 MG per tablet 2 tablet (2 tablets Oral Given 11/27/13 2316)  albuterol (PROVENTIL) (5 MG/ML) 0.5%  nebulizer solution 5 mg (5 mg Nebulization Given 11/27/13 2327)  ipratropium (ATROVENT) nebulizer solution 0.5 mg (0.5 mg Nebulization Given 11/27/13 2327)   DIAGNOSTIC STUDIES: Oxygen Saturation is 96% on Nitro, normal by my interpretation.    COORDINATION OF CARE: 10:07 PM- Discussed treatment plan with pt which includes imaging, lab work, and breathing treatment. Pt agrees to plan.    Labs Review Labs Reviewed  CBC WITH DIFFERENTIAL - Abnormal; Notable for the following:    Eosinophils Relative 7 (*)    All other components within normal limits  COMPREHENSIVE METABOLIC PANEL - Abnormal; Notable for the following:    Glucose, Bld 147 (*)    GFR calc non Af Amer 64 (*)    GFR calc Af Amer 74 (*)    All other components within normal limits  BLOOD GAS, ARTERIAL - Abnormal; Notable for the following:    pCO2 arterial 48.9 (*)    Bicarbonate 27.2 (*)    Acid-Base Excess 2.4 (*)    All other components within normal limits  TROPONIN I  PRO B NATRIURETIC PEPTIDE  D-DIMER, QUANTITATIVE   Imaging Review Dg Chest 2 View  11/27/2013   CLINICAL DATA:  Cough.  Shortness of breath.  EXAM: CHEST  2 VIEW  COMPARISON:  08/21/2013  FINDINGS: The heart size and mediastinal contours are within normal limits. A small left suprahilar density is most likely from the 1st costochondral junction - seen also on priors. Both lungs are clear, mildly hyperinflated. The visualized skeletal structures are unremarkable.  IMPRESSION: No active cardiopulmonary disease.   Electronically Signed   By: Tiburcio Pea M.D.   On: 11/27/2013 22:57    EKG Interpretation   None       MDM   1. COPD exacerbation    History of COPD on home oxygen with worsening shortness of breath for the past 2 days. Denies chest pain, fever or cough. Complains of chronic pain in her  low back. No focal weakness, numbness or tingling. No bowel and bladder incontinence.  Mild wheezing on exam.  No chest pain.  Chronic low back pain without red flag symptoms.  Nebs, steroids. ABG without CO2 retention.  Improved in the ED after two nebs. Ambulatory without desaturation on her home oxygen. Requesting to go home. Treat for COPD exacerbation with nebs, steroids, antibiotics, followup with PCP.   I personally performed the services described in this documentation, which was scribed in my presence. The recorded information has been reviewed and is accurate.    Glynn Octave, MD 11/28/13 1136

## 2013-12-20 ENCOUNTER — Other Ambulatory Visit (HOSPITAL_COMMUNITY): Payer: Self-pay | Admitting: Internal Medicine

## 2013-12-20 DIAGNOSIS — Z09 Encounter for follow-up examination after completed treatment for conditions other than malignant neoplasm: Secondary | ICD-10-CM

## 2013-12-25 ENCOUNTER — Encounter (HOSPITAL_COMMUNITY): Payer: Medicaid Other

## 2013-12-26 ENCOUNTER — Ambulatory Visit (INDEPENDENT_AMBULATORY_CARE_PROVIDER_SITE_OTHER): Payer: Medicaid Other | Admitting: Otolaryngology

## 2014-01-08 ENCOUNTER — Encounter (HOSPITAL_COMMUNITY): Payer: Medicaid Other

## 2014-01-29 ENCOUNTER — Encounter (HOSPITAL_COMMUNITY): Payer: Medicaid Other

## 2014-02-19 ENCOUNTER — Ambulatory Visit (HOSPITAL_COMMUNITY)
Admission: RE | Admit: 2014-02-19 | Discharge: 2014-02-19 | Disposition: A | Discharge status: Home / Self Care | Payer: Medicaid Other | Source: Ambulatory Visit | Attending: Internal Medicine | Admitting: Internal Medicine

## 2014-02-19 ENCOUNTER — Other Ambulatory Visit (HOSPITAL_COMMUNITY): Payer: Self-pay | Admitting: Internal Medicine

## 2014-02-19 DIAGNOSIS — Z09 Encounter for follow-up examination after completed treatment for conditions other than malignant neoplasm: Secondary | ICD-10-CM

## 2014-02-19 DIAGNOSIS — Z1231 Encounter for screening mammogram for malignant neoplasm of breast: Secondary | ICD-10-CM | POA: Insufficient documentation

## 2014-02-19 IMAGING — US US BREAST LTD UNI LEFT INC AXILLA
1 series · 4 of 4 positions shown · non-contrast
Comparison: none

[Series 1: us breast ltd uni left inc axilla · 0.06mm/px · 4 of 4 slices shown]
[im 1/4]
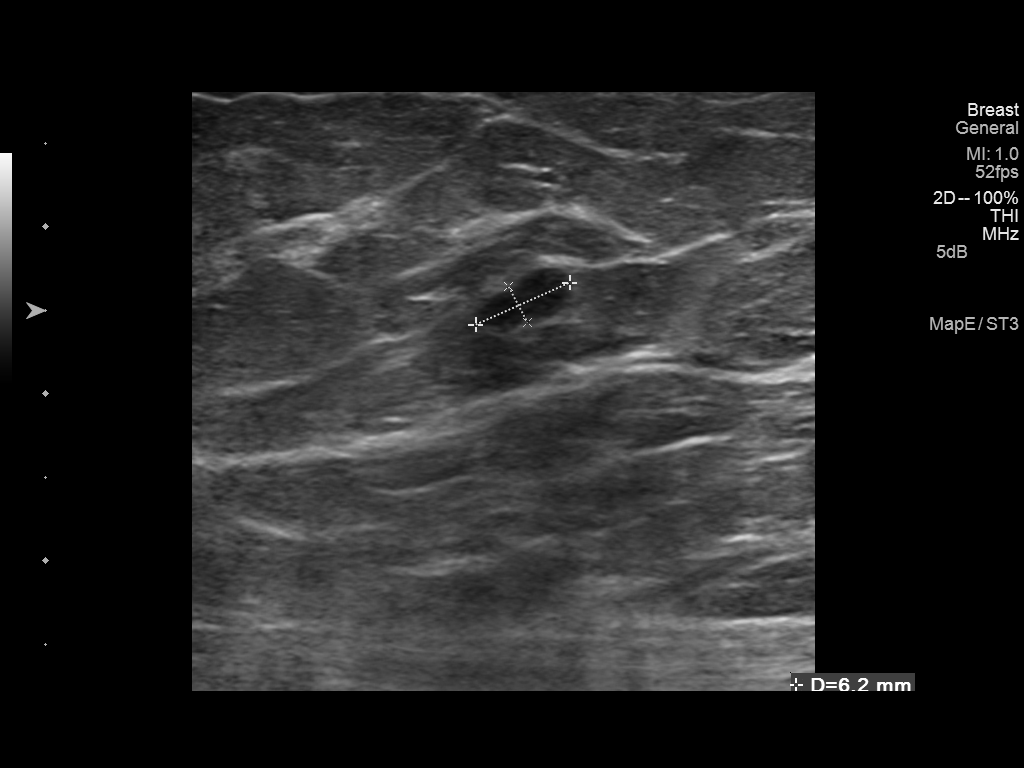
[im 2/4]
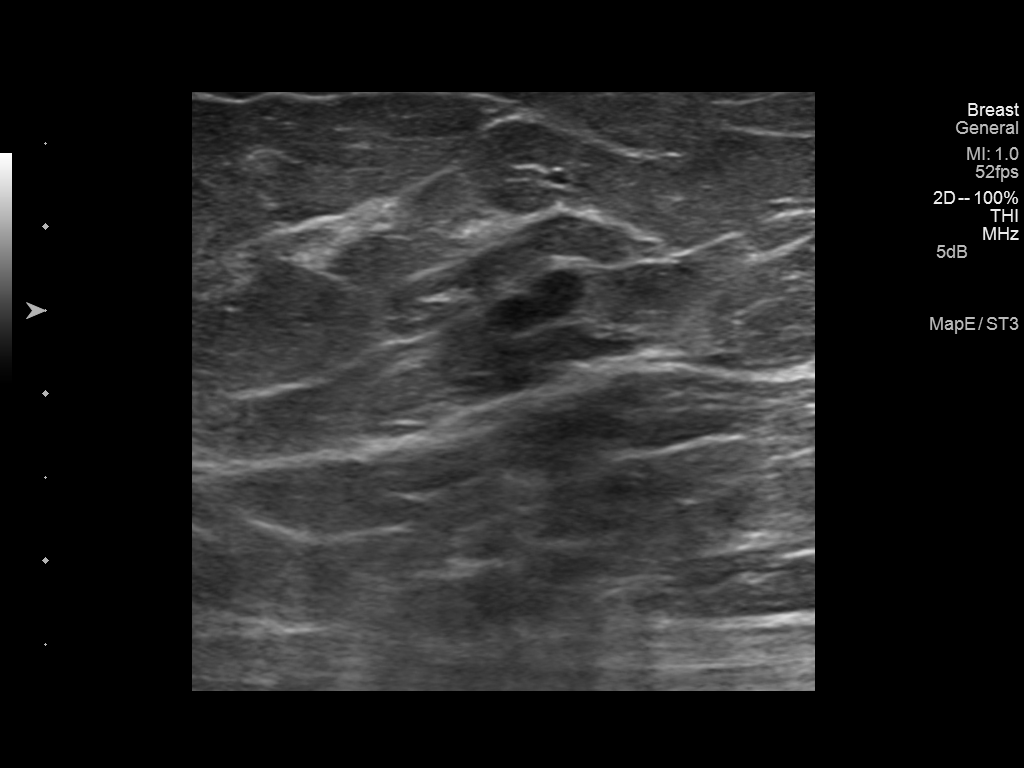
[im 3/4]
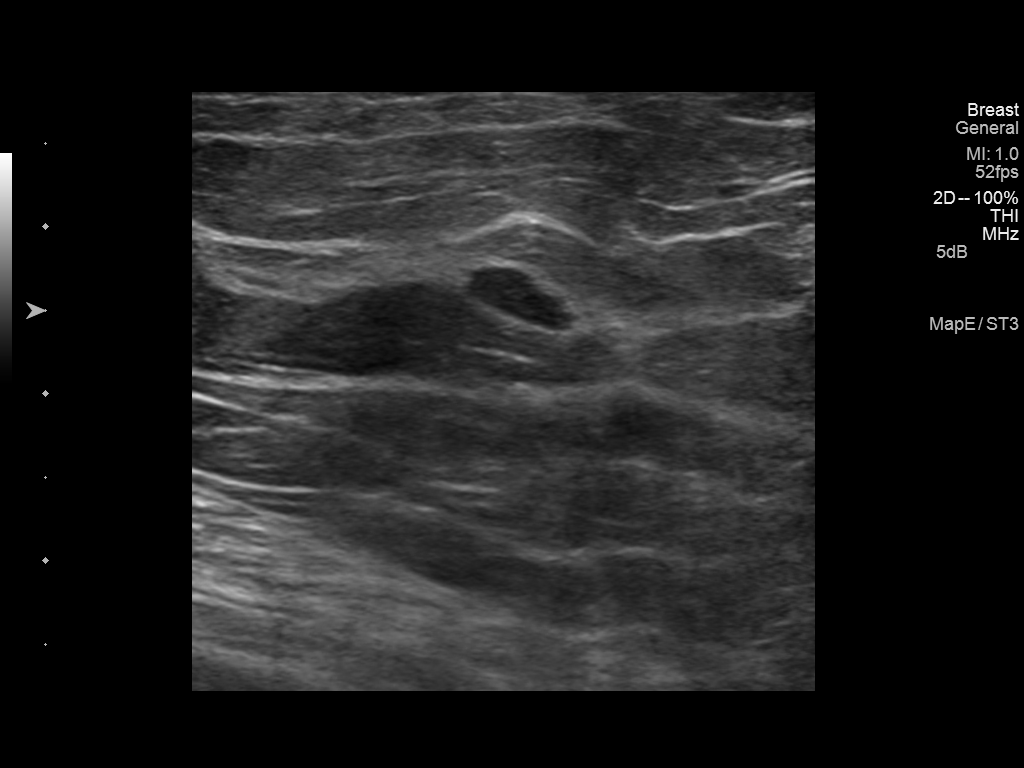
[im 4/4]
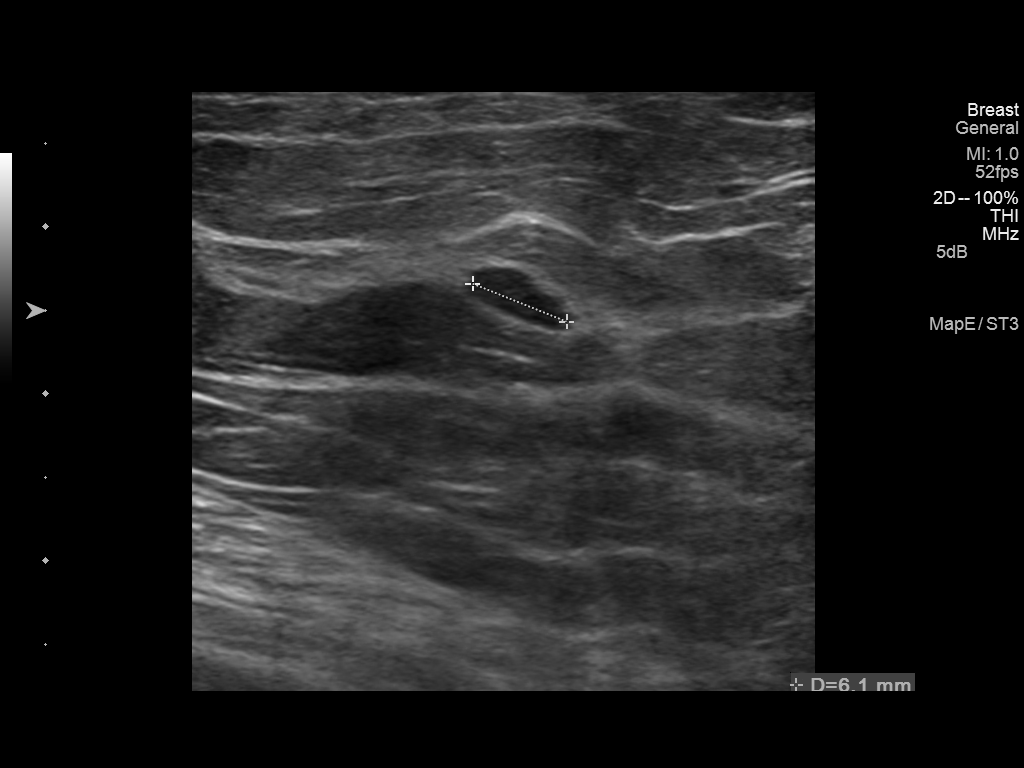

[4 of 4 positions shown; findings below may reference images not displayed]

CLINICAL DATA
Short-term interval followup of a probable benign lesion in the
right breast.

EXAM
DIGITAL DIAGNOSTIC  RIGHT MAMMOGRAM WITH CAD

ULTRASOUND RIGHT BREAST

COMPARISON
With priors dated [DATE] and [DATE].

ACR BREAST DENSITY
ACR Breast Density Category b: There are scattered areas of
fibroglandular density.

FINDINGS
The parenchymal pattern of the left breast is stable. The 8 mm
low-density nodule in the medial aspect of the breast is unchanged.
No new suspicious mass or malignant type microcalcifications
identified.

Mammographic images were processed with CAD.

On physical exam, I do not palpate a mass in the left breast.

Ultrasound is performed, showing a stable well-circumscribed,
hypoechoic lesion in the left breast at 10 o'clock 4 cm from the
nipple measuring 6 x 2 x 6 mm. On the prior ultrasound dated
[DATE] it measured 7 x 3 x 6 mm.

IMPRESSION
Probable stable benign fibroadenoma in the left breast.

RECOMMENDATION
Bilateral diagnostic mammogram an [DATE] is recommended.

I have discussed the findings and recommendations with the patient.
Results were also provided in writing at the conclusion of the
visit. If applicable, a reminder letter will be sent to the patient
regarding the next appointment.

BI-RADS CATEGORY
3: Probably benign finding(s) - short interval follow-up suggested.

SIGNATURE

## 2014-05-26 ENCOUNTER — Emergency Department (HOSPITAL_BASED_OUTPATIENT_CLINIC_OR_DEPARTMENT_OTHER)
Admission: EM | Admit: 2014-05-26 | Discharge: 2014-05-27 | Disposition: A | Discharge status: Home / Self Care | Payer: Medicaid Other | Attending: Emergency Medicine | Admitting: Emergency Medicine

## 2014-05-26 ENCOUNTER — Encounter (HOSPITAL_BASED_OUTPATIENT_CLINIC_OR_DEPARTMENT_OTHER): Payer: Self-pay | Admitting: Emergency Medicine

## 2014-05-26 DIAGNOSIS — IMO0002 Reserved for concepts with insufficient information to code with codable children: Secondary | ICD-10-CM | POA: Insufficient documentation

## 2014-05-26 DIAGNOSIS — IMO0001 Reserved for inherently not codable concepts without codable children: Secondary | ICD-10-CM | POA: Insufficient documentation

## 2014-05-26 DIAGNOSIS — M545 Low back pain, unspecified: Secondary | ICD-10-CM | POA: Insufficient documentation

## 2014-05-26 DIAGNOSIS — G8929 Other chronic pain: Secondary | ICD-10-CM | POA: Insufficient documentation

## 2014-05-26 DIAGNOSIS — Z79899 Other long term (current) drug therapy: Secondary | ICD-10-CM | POA: Insufficient documentation

## 2014-05-26 DIAGNOSIS — Z792 Long term (current) use of antibiotics: Secondary | ICD-10-CM | POA: Insufficient documentation

## 2014-05-26 DIAGNOSIS — M25559 Pain in unspecified hip: Secondary | ICD-10-CM | POA: Insufficient documentation

## 2014-05-26 DIAGNOSIS — J449 Chronic obstructive pulmonary disease, unspecified: Secondary | ICD-10-CM | POA: Insufficient documentation

## 2014-05-26 DIAGNOSIS — F17200 Nicotine dependence, unspecified, uncomplicated: Secondary | ICD-10-CM | POA: Insufficient documentation

## 2014-05-26 DIAGNOSIS — J4489 Other specified chronic obstructive pulmonary disease: Secondary | ICD-10-CM | POA: Insufficient documentation

## 2014-05-26 NOTE — ED Provider Notes (Signed)
CSN: [id number]     Arrival date & time 05/26/14  2345 History  This chart was scribed for Varney Biles, MD by Ladene Artist, ED Scribe. The patient was seen in room MH12/MH12. Patient's care was started at 12:13 AM.   Chief Complaint  Patient presents with  . Hip Pain   The history is provided by the patient. No language interpreter was used.   HPI Comments: Tammy Blair is a 63 y.o. female, with a h/o chronic back pain, who presents to the Emergency Department complaining of lower back pain that radiates down R leg and bilateral knees. Pt reports throbbing and burning sensations in her feet over the past 3 days. She denies new numbness/tingling, urinary or bowel incontinence. Pt is managed by outpatient physician and is trying to get established with PCP and pain control.  Past Medical History  Diagnosis Date  . Asthma   . COPD (chronic obstructive pulmonary disease)   . Back pain    Past Surgical History  Procedure Laterality Date  . Cesarean section    . Tonsillectomy     Family History  Problem Relation Age of Onset  . COPD Mother   . Cancer Mother     kidney  . Stroke Brother   . Asthma Sister    History  Substance Use Topics  . Smoking status: Current Every Day Smoker -- 0.50 packs/day for 40 years    Types: Cigarettes  . Smokeless tobacco: Not on file  . Alcohol Use: No   OB History   Grav Para Term Preterm Abortions TAB SAB Ect Mult Living                 Review of Systems  Musculoskeletal: Positive for arthralgias, back pain and myalgias.  Neurological: Negative for numbness.  All other systems reviewed and are negative.  Allergies  Review of patient's allergies indicates no known allergies.  Home Medications   Prior to Admission medications   Medication Sig Start Date End Date Taking? Authorizing Provider  albuterol (PROAIR HFA) 108 (90 BASE) MCG/ACT inhaler Inhale 2 puffs into the lungs 4 (four) times daily as needed. For shortness of breath     Historical Provider, MD  albuterol (PROVENTIL HFA;VENTOLIN HFA) 108 (90 BASE) MCG/ACT inhaler Inhale 2 puffs into the lungs every 6 (six) hours as needed for wheezing or shortness of breath. 11/27/13   Ezequiel Essex, MD  albuterol (PROVENTIL) (2.5 MG/3ML) 0.083% nebulizer solution Take 2.5 mg by nebulization every 4 (four) hours as needed. For shortness of breath    Historical Provider, MD  ALPRAZolam Duanne Moron) 1 MG tablet Take 1 mg by mouth 4 (four) times daily as needed. For anxiety    Historical Provider, MD  bisacodyl (DULCOLAX) 5 MG EC tablet Take 1 tablet (5 mg total) by mouth daily as needed. 08/22/13   Radene Gunning, NP  budesonide-formoterol (SYMBICORT) 160-4.5 MCG/ACT inhaler Inhale 1 puff into the lungs 2 (two) times daily.     Historical Provider, MD  cyclobenzaprine (FLEXERIL) 10 MG tablet Take 10 mg by mouth 2 (two) times daily as needed. For muscle spasm    Historical Provider, MD  doxycycline (VIBRAMYCIN) 100 MG capsule Take 1 capsule (100 mg total) by mouth 2 (two) times daily. 11/27/13   Ezequiel Essex, MD  metoprolol (LOPRESSOR) 50 MG tablet Take 50 mg by mouth 2 (two) times daily.    Historical Provider, MD  oxyCODONE (ROXICODONE) 15 MG immediate release tablet Take 15 mg by mouth  every 6 (six) hours as needed for pain.    Historical Provider, MD  oxycodone (ROXICODONE) 30 MG immediate release tablet Take 30 mg by mouth every 6 (six) hours as needed. For severe pain    Historical Provider, MD  predniSONE (DELTASONE) 50 MG tablet 1 tablet PO daily 11/27/13   Ezequiel Essex, MD  ranitidine (ZANTAC) 150 MG tablet Take 150 mg by mouth 2 (two) times daily.    Historical Provider, MD  tiotropium (SPIRIVA) 18 MCG inhalation capsule Place 18 mcg into inhaler and inhale daily.    Historical Provider, MD   Triage Vitals: BP 174/76  Pulse 92  Temp(Src) 98.3 F (36.8 C) (Oral)  Resp 18  Ht 5\' 8"  (1.727 m)  Wt 201 lb (91.173 kg)  BMI 30.57 kg/m2  SpO2 99% Physical Exam  Nursing note  and vitals reviewed. Constitutional: She is oriented to person, place, and time. She appears well-developed and well-nourished.  HENT:  Head: Normocephalic and atraumatic.  Eyes: Conjunctivae and EOM are normal.  Neck: Neck supple.  Cardiovascular: Normal rate.   Pulmonary/Chest: Effort normal.  Musculoskeletal: Normal range of motion.  Pt has tenderness over the lumbar region No step offs, no erythema. Pt has 1+ patellar reflex bilaterally. Able to discriminate between sharp and dull. Able to ambulate   Neurological: She is alert and oriented to person, place, and time.  Skin: Skin is warm and dry.  Psychiatric: She has a normal mood and affect. Her behavior is normal.   ED Course  Procedures (including critical care time) DIAGNOSTIC STUDIES: Oxygen Saturation is 99% on RA, normal by my interpretation.    COORDINATION OF CARE: 12:19 AM Discussed treatment plan with pt at bedside and pt agreed to plan.  Labs Review Labs Reviewed - No data to display  Imaging Review No results found.   EKG Interpretation None      MDM   Final diagnoses:  Chronic pain    I personally performed the services described in this documentation, which was scribed in my presence. The recorded information has been reviewed and is accurate.   Pt with possible flare up of her chronic pain. No red flags for spinal cord involvement. Pt appears quite comfortable - i don't see any reasons to treat her pain with iv meds that she is requesting. [place] controlled substance reporting checked- pt received 180 tablets total of narcotics filled and 120 total benzos filled. Asked her to get a new pcp, or wait till her pain appt end of this month for optimal management of her pain  Varney Biles, MD 05/27/14 [phone]

## 2014-05-26 NOTE — ED Notes (Signed)
Pt c/o right hip pain which radiates down right leg x 3 days

## 2014-05-27 MED ORDER — OXYCODONE-ACETAMINOPHEN 5-325 MG PO TABS
2.0000 | ORAL_TABLET | Freq: Once | ORAL | Status: AC
  Administered 2014-05-27: 2 via ORAL
  Filled 2014-05-27: qty 2

## 2014-05-27 MED ORDER — CYCLOBENZAPRINE HCL 10 MG PO TABS
5.0000 mg | ORAL_TABLET | Freq: Once | ORAL | Status: AC
  Administered 2014-05-27: 5 mg via ORAL
  Filled 2014-05-27: qty 1

## 2014-05-27 MED ORDER — IBUPROFEN 400 MG PO TABS
600.0000 mg | ORAL_TABLET | Freq: Once | ORAL | Status: AC
  Administered 2014-05-27: 01:00:00 600 mg via ORAL
  Filled 2014-05-27 (×2): qty 1

## 2014-05-27 NOTE — Discharge Instructions (Signed)
Please see a primary care doctor or pain specialist for proper and appropriate care of your back pain.   Chronic Back Pain  When back pain lasts longer than 3 months, it is called chronic back pain.People with chronic back pain often go through certain periods that are more intense (flare-ups).  CAUSES Chronic back pain can be caused by wear and tear (degeneration) on different structures in your back. These structures include:  The bones of your spine (vertebrae) and the joints surrounding your spinal cord and nerve roots (facets).  The strong, fibrous tissues that connect your vertebrae (ligaments). Degeneration of these structures may result in pressure on your nerves. This can lead to constant pain. HOME CARE INSTRUCTIONS  Avoid bending, heavy lifting, prolonged sitting, and activities which make the problem worse.  Take brief periods of rest throughout the day to reduce your pain. Lying down or standing usually is better than sitting while you are resting.  Take over-the-counter or prescription medicines only as directed by your caregiver. SEEK IMMEDIATE MEDICAL CARE IF:   You have weakness or numbness in one of your legs or feet.  You have trouble controlling your bladder or bowels.  You have nausea, vomiting, abdominal pain, shortness of breath, or fainting. Document Released: 01/05/2005 Document Revised: 02/20/2012 Document Reviewed: 11/12/2011 [facility] Patient Information 2014 [place], [place].

## 2014-06-06 ENCOUNTER — Other Ambulatory Visit (HOSPITAL_COMMUNITY): Payer: Self-pay | Admitting: Family Medicine

## 2014-06-06 DIAGNOSIS — Z09 Encounter for follow-up examination after completed treatment for conditions other than malignant neoplasm: Secondary | ICD-10-CM

## 2014-06-06 DIAGNOSIS — D242 Benign neoplasm of left breast: Secondary | ICD-10-CM

## 2014-06-11 ENCOUNTER — Emergency Department (HOSPITAL_COMMUNITY): Payer: Medicaid Other

## 2014-06-11 ENCOUNTER — Encounter (HOSPITAL_COMMUNITY): Payer: Self-pay | Admitting: Emergency Medicine

## 2014-06-11 ENCOUNTER — Emergency Department (HOSPITAL_COMMUNITY)
Admission: EM | Admit: 2014-06-11 | Discharge: 2014-06-11 | Discharge status: Against Medical Advice | Payer: Medicaid Other | Attending: Emergency Medicine | Admitting: Emergency Medicine

## 2014-06-11 ENCOUNTER — Encounter (HOSPITAL_COMMUNITY): Payer: Medicaid Other

## 2014-06-11 DIAGNOSIS — J449 Chronic obstructive pulmonary disease, unspecified: Secondary | ICD-10-CM | POA: Diagnosis not present

## 2014-06-11 DIAGNOSIS — Z79899 Other long term (current) drug therapy: Secondary | ICD-10-CM | POA: Diagnosis not present

## 2014-06-11 DIAGNOSIS — G8929 Other chronic pain: Secondary | ICD-10-CM | POA: Diagnosis not present

## 2014-06-11 DIAGNOSIS — F17200 Nicotine dependence, unspecified, uncomplicated: Secondary | ICD-10-CM | POA: Diagnosis not present

## 2014-06-11 DIAGNOSIS — Z8739 Personal history of other diseases of the musculoskeletal system and connective tissue: Secondary | ICD-10-CM | POA: Insufficient documentation

## 2014-06-11 DIAGNOSIS — Z9889 Other specified postprocedural states: Secondary | ICD-10-CM | POA: Diagnosis not present

## 2014-06-11 DIAGNOSIS — R109 Unspecified abdominal pain: Secondary | ICD-10-CM | POA: Diagnosis present

## 2014-06-11 DIAGNOSIS — J4489 Other specified chronic obstructive pulmonary disease: Secondary | ICD-10-CM | POA: Insufficient documentation

## 2014-06-11 DIAGNOSIS — F411 Generalized anxiety disorder: Secondary | ICD-10-CM | POA: Insufficient documentation

## 2014-06-11 DIAGNOSIS — H9190 Unspecified hearing loss, unspecified ear: Secondary | ICD-10-CM | POA: Insufficient documentation

## 2014-06-11 HISTORY — DX: Unspecified hearing loss, unspecified ear: H91.90

## 2014-06-11 HISTORY — DX: Anxiety disorder, unspecified: F41.9

## 2014-06-11 HISTORY — DX: Radiculopathy, lumbar region: M54.16

## 2014-06-11 HISTORY — DX: Dorsalgia, unspecified: M54.9

## 2014-06-11 HISTORY — DX: Other chronic pain: G89.29

## 2014-06-11 LAB — COMPREHENSIVE METABOLIC PANEL
ALT: 23 U/L (ref 0–35)
AST: 15 U/L (ref 0–37)
Albumin: 3.9 g/dL (ref 3.5–5.2)
Alkaline Phosphatase: 71 U/L (ref 39–117)
Anion gap: 15 (ref 5–15)
BUN: 18 mg/dL (ref 6–23)
CO2: 27 mEq/L (ref 19–32)
CREATININE: 0.89 mg/dL (ref 0.50–1.10)
Calcium: 10 mg/dL (ref 8.4–10.5)
Chloride: 97 mEq/L (ref 96–112)
GFR, EST AFRICAN AMERICAN: 79 mL/min — AB (ref >90)
GFR, EST NON AFRICAN AMERICAN: 69 mL/min — AB (ref >90)
Glucose, Bld: 102 mg/dL — ABNORMAL HIGH (ref 70–99)
Potassium: 3.5 mEq/L — ABNORMAL LOW (ref 3.7–5.3)
SODIUM: 139 meq/L (ref 137–147)
TOTAL PROTEIN: 8.5 g/dL — AB (ref 6.0–8.3)
Total Bilirubin: 0.8 mg/dL (ref 0.3–1.2)

## 2014-06-11 LAB — URINALYSIS, ROUTINE W REFLEX MICROSCOPIC
GLUCOSE, UA: NEGATIVE mg/dL
HGB URINE DIPSTICK: NEGATIVE
Leukocytes, UA: NEGATIVE
Nitrite: POSITIVE — AB
PROTEIN: 30 mg/dL — AB
Specific Gravity, Urine: >1.03 — ABNORMAL HIGH (ref 1.005–1.030)
UROBILINOGEN UA: 1 mg/dL (ref 0.0–1.0)
pH: 5 (ref 5.0–8.0)

## 2014-06-11 LAB — LACTIC ACID, PLASMA: Lactic Acid, Venous: 2.4 mmol/L — ABNORMAL HIGH (ref 0.5–2.2)

## 2014-06-11 LAB — CBC WITH DIFFERENTIAL/PLATELET
BASOS ABS: 0 10*3/uL (ref 0.0–0.1)
Basophils Relative: 0 % (ref 0–1)
EOS ABS: 0.2 10*3/uL (ref 0.0–0.7)
EOS PCT: 1 % (ref 0–5)
HEMATOCRIT: 41.5 % (ref 36.0–46.0)
Hemoglobin: 14.5 g/dL (ref 12.0–15.0)
Lymphocytes Relative: 19 % (ref 12–46)
Lymphs Abs: 2.4 10*3/uL (ref 0.7–4.0)
MCH: 31 pg (ref 26.0–34.0)
MCHC: 34.9 g/dL (ref 30.0–36.0)
MCV: 88.9 fL (ref 78.0–100.0)
MONO ABS: 1.3 10*3/uL — AB (ref 0.1–1.0)
Monocytes Relative: 10 % (ref 3–12)
Neutro Abs: 8.9 10*3/uL — ABNORMAL HIGH (ref 1.7–7.7)
Neutrophils Relative %: 70 % (ref 43–77)
PLATELETS: 323 10*3/uL (ref 150–400)
RBC: 4.67 MIL/uL (ref 3.87–5.11)
RDW: 12.8 % (ref 11.5–15.5)
WBC: 12.8 10*3/uL — ABNORMAL HIGH (ref 4.0–10.5)

## 2014-06-11 LAB — URINE MICROSCOPIC-ADD ON

## 2014-06-11 LAB — LIPASE, BLOOD: LIPASE: 17 U/L (ref 11–59)

## 2014-06-11 LAB — TROPONIN I

## 2014-06-11 IMAGING — CR DG CHEST 2V
2 series · 2 of 2 positions shown · non-contrast
Comparison: PA and lateral chest of [DATE]

CLINICAL DATA: Shortness of breath and nausea for 24 hr; history of
COPD and asthma and tobacco use

EXAM:
CHEST  2 VIEW

[view not recorded (1 of 2)]
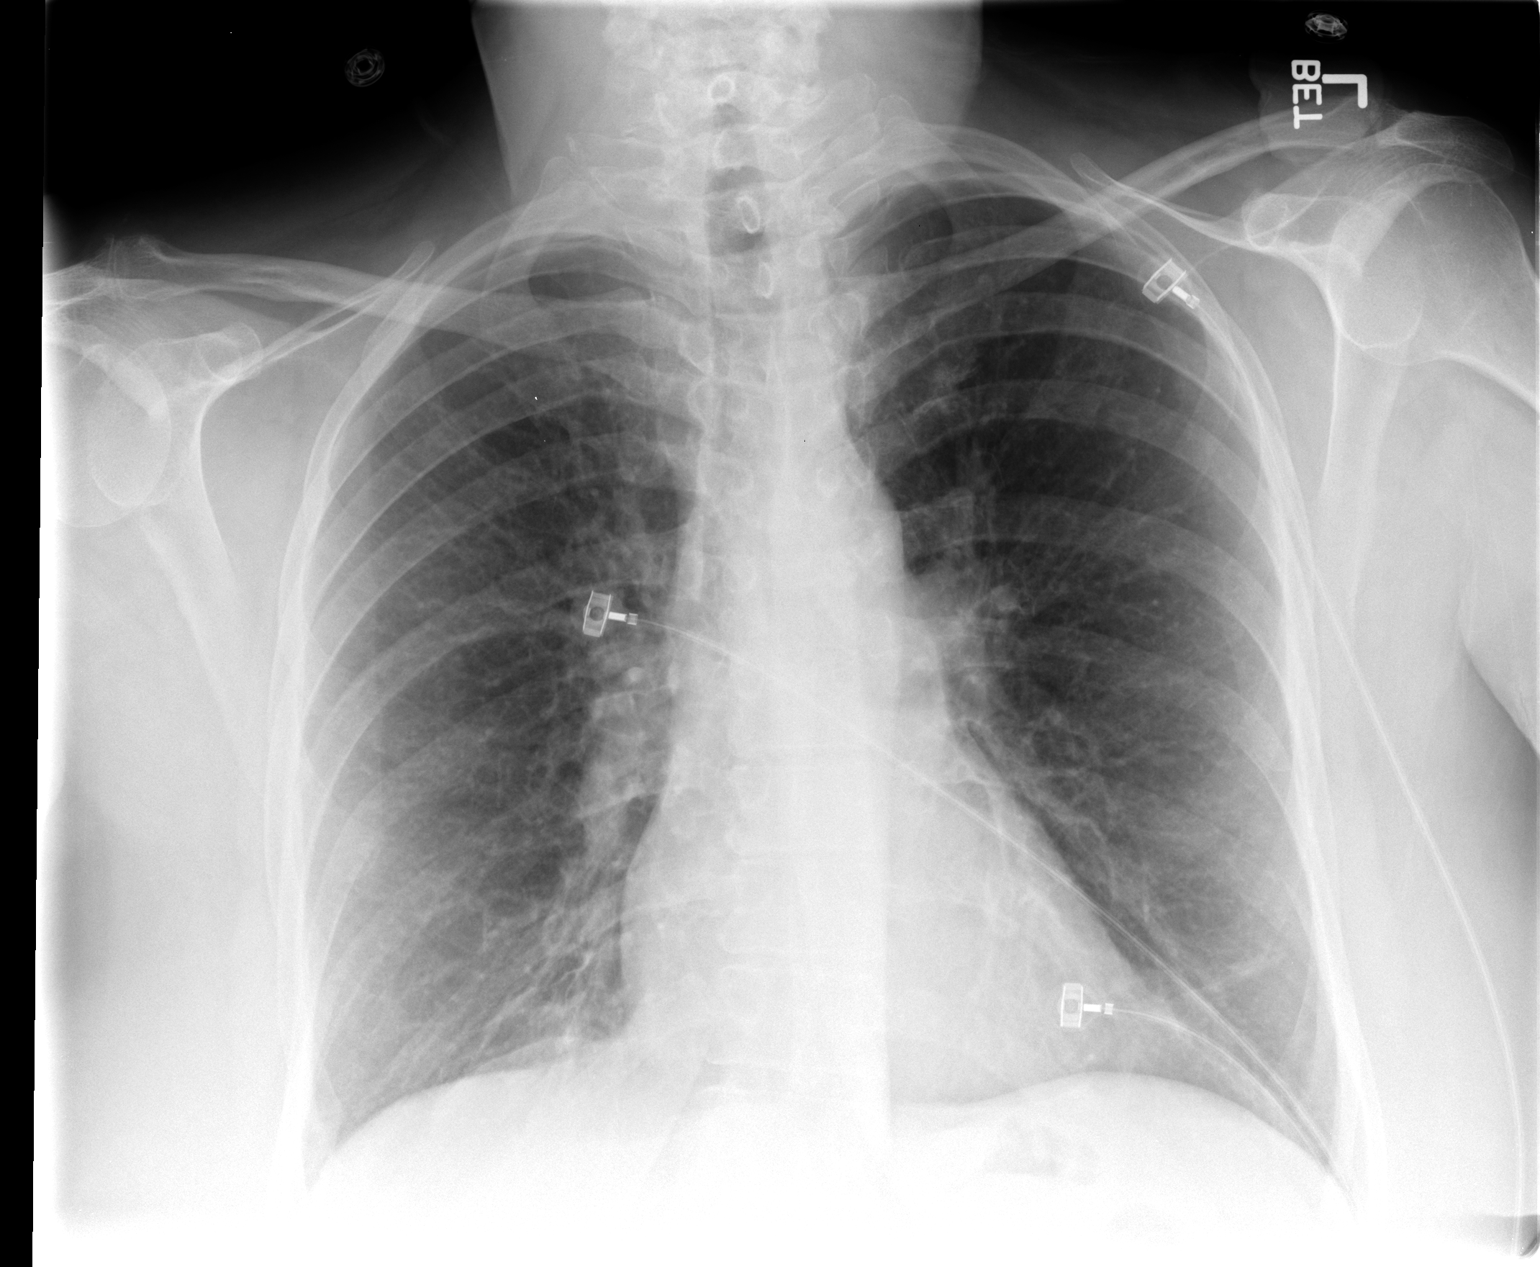

[view not recorded (2 of 2)]
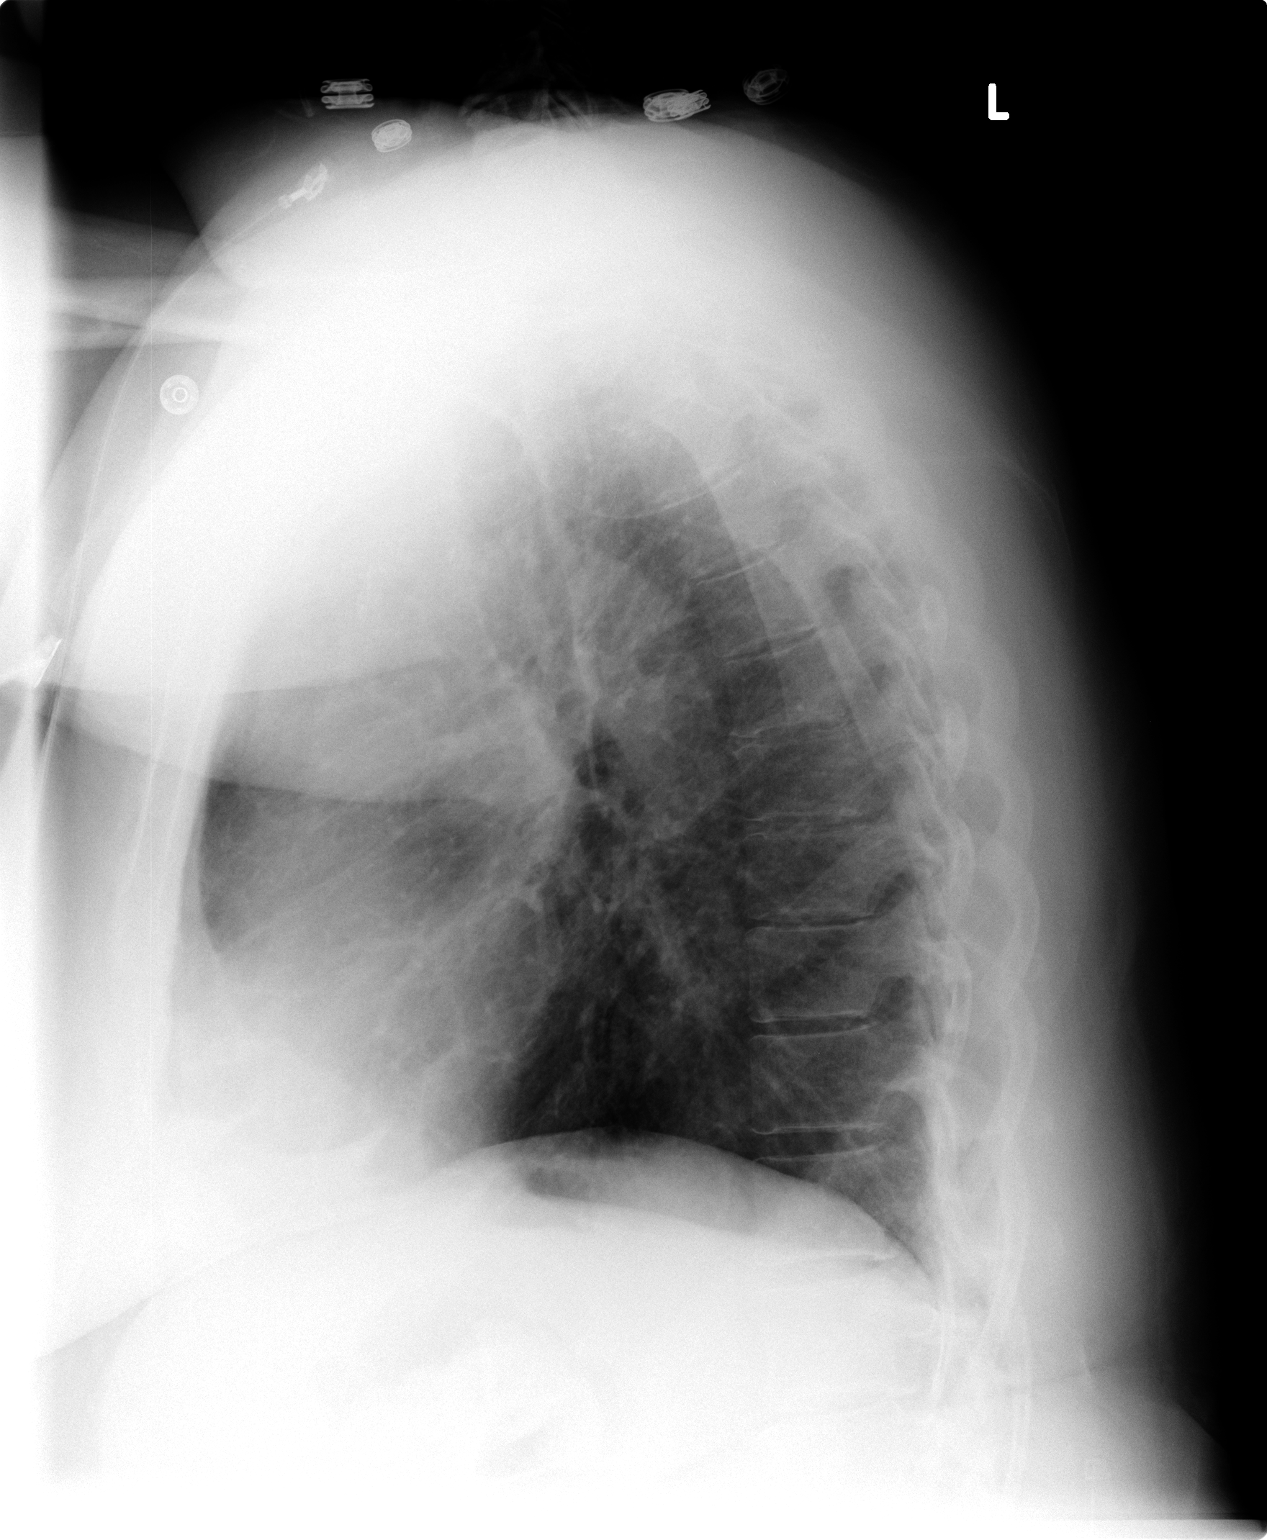

[2 of 2 positions shown; findings below may reference images not displayed]

FINDINGS: The lungs are mildly hyperinflated. There are stable coarse lung
markings in the right infrahilar region and lateral to the left
heart border. The heart is normal in size. The pulmonary vascularity
is not engorged. There is no pleural effusion. The bony thorax is
unremarkable.
IMPRESSION: The findings are consistent with COPD. There is no acute
cardiopulmonary abnormality.

## 2014-06-11 MED ORDER — DICYCLOMINE HCL 10 MG PO CAPS
20.0000 mg | ORAL_CAPSULE | Freq: Once | ORAL | Status: DC
  Filled 2014-06-11: qty 2

## 2014-06-11 MED ORDER — SODIUM CHLORIDE 0.9 % IV SOLN: INTRAVENOUS | Status: DC

## 2014-06-11 MED ORDER — PROMETHAZINE HCL 25 MG/ML IJ SOLN
6.2500 mg | Freq: Once | INTRAMUSCULAR | Status: DC
  Filled 2014-06-11: qty 1

## 2014-06-11 NOTE — ED Notes (Signed)
Pt refused meds

## 2014-06-11 NOTE — ED Notes (Signed)
Pt states she went to the bathroom and had a very large BM and feels much better. Wants to go home, refuses any further intervention

## 2014-06-11 NOTE — ED Notes (Signed)
Patient denies nausea, vomiting, and diarrhea.

## 2014-06-11 NOTE — ED Notes (Signed)
Patient states that she has been short of breath since last night and that she is feels like something is sticking her in the stomach.

## 2014-06-11 NOTE — ED Provider Notes (Signed)
CSN: [id number]     Arrival date & time 06/11/14  1855 History   First MD Initiated Contact with Patient 06/11/14 1912     Chief Complaint  Patient presents with  . Abdominal Pain      HPI Pt was seen at 1920.  Per pt, c/o gradual onset and persistence of constant generalized abd "pain" since yesterday. Describes the abd pain as "sharp" and "sticking."  Denies N/V, no diarrhea, no fevers, no flank pain, no rash, no CP/SOB, no black or blood in stools.       Past Medical History  Diagnosis Date  . Asthma   . Chronic back pain   . Lumbar radiculopathy   . COPD (chronic obstructive pulmonary disease)     on home O2  . Anxiety   . HOH (hard of hearing)    Past Surgical History  Procedure Laterality Date  . Cesarean section    . Tonsillectomy     Family History  Problem Relation Age of Onset  . COPD Mother   . Cancer Mother     kidney  . Stroke Brother   . Asthma Sister    History  Substance Use Topics  . Smoking status: Current Every Day Smoker -- 0.50 packs/day for 40 years    Types: Cigarettes  . Smokeless tobacco: Not on file  . Alcohol Use: No    Review of Systems ROS: Statement: All systems negative except as marked or noted in the HPI; Constitutional: Negative for fever and chills. ; ; Eyes: Negative for eye pain, redness and discharge. ; ; ENMT: Negative for ear pain, hoarseness, nasal congestion, sinus pressure and sore throat. ; ; Cardiovascular: Negative for chest pain, palpitations, diaphoresis, dyspnea and peripheral edema. ; ; Respiratory: Negative for cough, wheezing and stridor. ; ; Gastrointestinal: +abd pain. Negative for nausea, vomiting, diarrhea, blood in stool, hematemesis, jaundice and rectal bleeding. . ; ; Genitourinary: Negative for dysuria, flank pain and hematuria. ; ; Musculoskeletal: Negative for back pain and neck pain. Negative for swelling and trauma.; ; Skin: Negative for pruritus, rash, abrasions, blisters, bruising and skin lesion.; ; Neuro:  Negative for headache, lightheadedness and neck stiffness. Negative for weakness, altered level of consciousness , altered mental status, extremity weakness, paresthesias, involuntary movement, seizure and syncope.      Allergies  Review of patient's allergies indicates no known allergies.  Home Medications   Prior to Admission medications   Medication Sig Start Date End Date Taking? Authorizing Provider  albuterol (PROAIR HFA) 108 (90 BASE) MCG/ACT inhaler Inhale 2 puffs into the lungs 4 (four) times daily as needed. For shortness of breath   Yes Historical Provider, MD  albuterol (PROVENTIL) (2.5 MG/3ML) 0.083% nebulizer solution Take 2.5 mg by nebulization every 4 (four) hours as needed. For shortness of breath   Yes Historical Provider, MD  ALPRAZolam Duanne Moron) 1 MG tablet Take 1 mg by mouth 4 (four) times daily as needed. For anxiety   Yes Historical Provider, MD  budesonide-formoterol (SYMBICORT) 160-4.5 MCG/ACT inhaler Inhale 1 puff into the lungs 2 (two) times daily.    Yes Historical Provider, MD  cyclobenzaprine (FLEXERIL) 10 MG tablet Take 10 mg by mouth 2 (two) times daily as needed. For muscle spasm   Yes Historical Provider, MD  metoprolol (LOPRESSOR) 50 MG tablet Take 50 mg by mouth 2 (two) times daily.   Yes Historical Provider, MD  oxyCODONE (ROXICODONE) 15 MG immediate release tablet Take 15 mg by mouth every 6 (six) hours  as needed for pain.   Yes Historical Provider, MD  oxycodone (ROXICODONE) 30 MG immediate release tablet Take 30 mg by mouth every 6 (six) hours as needed. For severe pain   Yes Historical Provider, MD  ranitidine (ZANTAC) 150 MG tablet Take 150 mg by mouth 2 (two) times daily.   Yes Historical Provider, MD  tiotropium (SPIRIVA) 18 MCG inhalation capsule Place 18 mcg into inhaler and inhale daily.   Yes Historical Provider, MD   BP 138/76  Pulse 104  Temp(Src) 97.4 F (36.3 C) (Oral)  Resp 20  Ht 5\' 8"  (1.727 m)  Wt 205 lb (92.987 kg)  BMI 31.18 kg/m2   SpO2 97% Physical Exam 1925: Physical examination:  Nursing notes reviewed; Vital signs and O2 SAT reviewed;  Constitutional: Well developed, Well nourished, Well hydrated, In no acute distress; Head:  Normocephalic, atraumatic; Eyes: EOMI, PERRL, No scleral icterus; ENMT: Mouth and pharynx normal, Mucous membranes moist; Neck: Supple, Full range of motion, No lymphadenopathy; Cardiovascular: Regular rate and rhythm, No gallop; Respiratory: Breath sounds clear & equal bilaterally, No wheezes.  Speaking full sentences with ease, Normal respiratory effort/excursion; Chest: Nontender, Movement normal; Abdomen: Soft, +mild diffuse tenderness to palp. No rebound or guarding. Nondistended, Normal bowel sounds; Genitourinary: No CVA tenderness; Extremities: Pulses normal, No tenderness, No edema, No calf edema or asymmetry.; Neuro: AA&Ox3, Major CN grossly intact.  Speech clear. No gross focal motor or sensory deficits in extremities.; Skin: Color normal, Warm, Dry.; Psych:  Anxious.    ED Course  Procedures    EKG Interpretation   Date/Time:  Wednesday June 11 2014 19:12:50 EDT Ventricular Rate:  103 PR Interval:  148 QRS Duration: 100 QT Interval:  391 QTC Calculation: 512 R Axis:   67 Text Interpretation:  Sinus tachycardia Nonspecific repol abnormality,  inferior leads Prolonged QT interval Baseline wander When compared with  ECG of 09/25/2012 QT has lengthened Confirmed by [facility]  MD, Nunzio Cory  [phone]) on 06/11/2014 8:11:01 PM      MDM  MDM Reviewed: previous chart, nursing note and vitals Reviewed previous: labs and ECG Interpretation: labs, ECG and x-ray    Results for orders placed during the hospital encounter of 06/11/14  URINALYSIS, ROUTINE W REFLEX MICROSCOPIC      Result Value Ref Range   Color, Urine YELLOW  YELLOW   APPearance CLEAR  CLEAR   Specific Gravity, Urine >1.030 (*) 1.005 - 1.030   pH 5.0  5.0 - 8.0   Glucose, UA NEGATIVE  NEGATIVE mg/dL   Hgb urine  dipstick NEGATIVE  NEGATIVE   Bilirubin Urine MODERATE (*) NEGATIVE   Ketones, ur TRACE (*) NEGATIVE mg/dL   Protein, ur 30 (*) NEGATIVE mg/dL   Urobilinogen, UA 1.0  0.0 - 1.0 mg/dL   Nitrite POSITIVE (*) NEGATIVE   Leukocytes, UA NEGATIVE  NEGATIVE  CBC WITH DIFFERENTIAL      Result Value Ref Range   WBC 12.8 (*) 4.0 - 10.5 K/uL   RBC 4.67  3.87 - 5.11 MIL/uL   Hemoglobin 14.5  12.0 - 15.0 g/dL   HCT 41.5  36.0 - 46.0 %   MCV 88.9  78.0 - 100.0 fL   MCH 31.0  26.0 - 34.0 pg   MCHC 34.9  30.0 - 36.0 g/dL   RDW 12.8  11.5 - 15.5 %   Platelets 323  150 - 400 K/uL   Neutrophils Relative % 70  43 - 77 %   Neutro Abs 8.9 (*) 1.7 - 7.7  K/uL   Lymphocytes Relative 19  12 - 46 %   Lymphs Abs 2.4  0.7 - 4.0 K/uL   Monocytes Relative 10  3 - 12 %   Monocytes Absolute 1.3 (*) 0.1 - 1.0 K/uL   Eosinophils Relative 1  0 - 5 %   Eosinophils Absolute 0.2  0.0 - 0.7 K/uL   Basophils Relative 0  0 - 1 %   Basophils Absolute 0.0  0.0 - 0.1 K/uL  COMPREHENSIVE METABOLIC PANEL      Result Value Ref Range   Sodium 139  137 - 147 mEq/L   Potassium 3.5 (*) 3.7 - 5.3 mEq/L   Chloride 97  96 - 112 mEq/L   CO2 27  19 - 32 mEq/L   Glucose, Bld 102 (*) 70 - 99 mg/dL   BUN 18  6 - 23 mg/dL   Creatinine, Ser 0.89  0.50 - 1.10 mg/dL   Calcium 10.0  8.4 - 10.5 mg/dL   Total Protein 8.5 (*) 6.0 - 8.3 g/dL   Albumin 3.9  3.5 - 5.2 g/dL   AST 15  0 - 37 U/L   ALT 23  0 - 35 U/L   Alkaline Phosphatase 71  39 - 117 U/L   Total Bilirubin 0.8  0.3 - 1.2 mg/dL   GFR calc non Af Amer 69 (*) >90 mL/min   GFR calc Af Amer 79 (*) >90 mL/min   Anion gap 15  5 - 15  LIPASE, BLOOD      Result Value Ref Range   Lipase 17  11 - 59 U/L  LACTIC ACID, PLASMA      Result Value Ref Range   Lactic Acid, Venous 2.4 (*) 0.5 - 2.2 mmol/L  TROPONIN I      Result Value Ref Range   Troponin I <0.30  <0.30 ng/mL  URINE MICROSCOPIC-ADD ON      Result Value Ref Range   Squamous Epithelial / LPF RARE  RARE   WBC, UA 0-2   <3 WBC/hpf   RBC / HPF 0-2  <3 RBC/hpf   Bacteria, UA MANY (*) RARE   Urine-Other AMORPHOUS URATES/PHOSPHATES     Dg Chest 2 View 06/11/2014   CLINICAL DATA:  Shortness of breath and nausea for 24 hr; history of COPD and asthma and tobacco use  EXAM: CHEST  2 VIEW  COMPARISON:  PA and lateral chest of November 27, 2013  FINDINGS: The lungs are mildly hyperinflated. There are stable coarse lung markings in the right infrahilar region and lateral to the left heart border. The heart is normal in size. The pulmonary vascularity is not engorged. There is no pleural effusion. The bony thorax is unremarkable.  IMPRESSION: The findings are consistent with COPD. There is no acute cardiopulmonary abnormality.   Electronically Signed   By: David  Martinique   On: 06/11/2014 20:15    2100:   Pt states she "doesn't want a CT scan" and is demanding the ED RN take out her IV. When asked why she did not want the CT scan, pt stated she was "too nervous." ED RN and I explained we can give her a dose of ativan or valium before her CT scan, and further explained physically what a CT scan is/looks like/etc. Pt continues to refuse and demands to "leave right now," while starting to remove her own IV. ED RN and I again explained above, and encouraged pt to stay. Pt continues to refuse and is calling  her family for a ride home.  Pt makes her own medical decisions.  Risks of AMA explained to pt, including, but not limited to:  Ischemic bowel, SBO, acute colitis, perforated bowel, stroke, heart attack, cardiac arrythmia ("irregular heart rate/beat"), "passing out," temporary and/or permanent disability, death.  Pt verb understanding and continues to refuse CT scan, understanding the consequences of her decision.  I encouraged pt to follow up with her PMD tomorrow and return to the ED immediately if symptoms worsen, she changes her mind, or for any other concerns.  Pt verb understanding, agreeable.     Alfonzo Feller, DO 06/12/14  1639

## 2014-06-11 NOTE — ED Notes (Signed)
Pt understands that she is leaving against medical advise. We have not made a diagnosis about her pain and risk of not compleating treatment is at worst DEATH. Pt also understands that she may at any time return to ED for treatment. Pt states she is going outside to smoke and will call her sister to come get her after [place] lets out. Pt has a cell phone with her. Pt did have one of her sisters call her as I was discharging her and pt refused to talk to her now, stating she will call her from home. Pt left here easily ambulatory in no distress

## 2014-06-12 NOTE — ED Notes (Signed)
Pt remains anxious, does not want to drink CT contrast. "I don't want to put that poison in my body" ERMD aware. Will do CT w/o contrast.

## 2014-06-12 NOTE — ED Notes (Addendum)
Now refusing CT, wants to go home. Dr. Thurnell Garbe has been with pt for re-eval.

## 2014-06-12 NOTE — ED Notes (Signed)
Pt very anxious, needing constant reassurance and explanations. Poor historian, circular conversations.here alone and does not want anyone called for her states her brother dropped her off and she'll call him later

## 2014-06-13 LAB — URINE CULTURE
CULTURE: NO GROWTH
Colony Count: NO GROWTH

## 2014-06-17 ENCOUNTER — Ambulatory Visit (HOSPITAL_COMMUNITY)
Admission: RE | Admit: 2014-06-17 | Discharge: 2014-06-17 | Disposition: A | Discharge status: Home / Self Care | Payer: Medicaid Other | Source: Ambulatory Visit | Attending: Family Medicine | Admitting: Family Medicine

## 2014-06-17 DIAGNOSIS — Z09 Encounter for follow-up examination after completed treatment for conditions other than malignant neoplasm: Secondary | ICD-10-CM

## 2014-06-17 DIAGNOSIS — R928 Other abnormal and inconclusive findings on diagnostic imaging of breast: Secondary | ICD-10-CM | POA: Insufficient documentation

## 2014-06-17 DIAGNOSIS — D242 Benign neoplasm of left breast: Secondary | ICD-10-CM

## 2014-07-17 ENCOUNTER — Emergency Department (HOSPITAL_COMMUNITY)
Admission: EM | Admit: 2014-07-17 | Discharge: 2014-07-17 | Disposition: A | Discharge status: Home / Self Care | Payer: Medicaid Other | Attending: Emergency Medicine | Admitting: Emergency Medicine

## 2014-07-17 ENCOUNTER — Emergency Department (HOSPITAL_COMMUNITY): Payer: Medicaid Other

## 2014-07-17 ENCOUNTER — Encounter (HOSPITAL_COMMUNITY): Payer: Self-pay | Admitting: Emergency Medicine

## 2014-07-17 DIAGNOSIS — M545 Low back pain, unspecified: Secondary | ICD-10-CM | POA: Insufficient documentation

## 2014-07-17 DIAGNOSIS — G8929 Other chronic pain: Secondary | ICD-10-CM | POA: Insufficient documentation

## 2014-07-17 DIAGNOSIS — Z9981 Dependence on supplemental oxygen: Secondary | ICD-10-CM | POA: Diagnosis not present

## 2014-07-17 DIAGNOSIS — F17200 Nicotine dependence, unspecified, uncomplicated: Secondary | ICD-10-CM | POA: Insufficient documentation

## 2014-07-17 DIAGNOSIS — H9190 Unspecified hearing loss, unspecified ear: Secondary | ICD-10-CM | POA: Insufficient documentation

## 2014-07-17 DIAGNOSIS — J449 Chronic obstructive pulmonary disease, unspecified: Secondary | ICD-10-CM | POA: Insufficient documentation

## 2014-07-17 DIAGNOSIS — Z79899 Other long term (current) drug therapy: Secondary | ICD-10-CM | POA: Insufficient documentation

## 2014-07-17 DIAGNOSIS — J4489 Other specified chronic obstructive pulmonary disease: Secondary | ICD-10-CM | POA: Insufficient documentation

## 2014-07-17 DIAGNOSIS — F411 Generalized anxiety disorder: Secondary | ICD-10-CM | POA: Insufficient documentation

## 2014-07-17 LAB — URINALYSIS, ROUTINE W REFLEX MICROSCOPIC
Bilirubin Urine: NEGATIVE
Glucose, UA: NEGATIVE mg/dL
HGB URINE DIPSTICK: NEGATIVE
Ketones, ur: NEGATIVE mg/dL
Leukocytes, UA: NEGATIVE
Nitrite: NEGATIVE
PROTEIN: NEGATIVE mg/dL
SPECIFIC GRAVITY, URINE: 1.01 (ref 1.005–1.030)
UROBILINOGEN UA: 0.2 mg/dL (ref 0.0–1.0)
pH: 6.5 (ref 5.0–8.0)

## 2014-07-17 IMAGING — CR DG LUMBAR SPINE COMPLETE 4+V
6 series · 6 of 6 positions shown · non-contrast
Comparison: [DATE]

CLINICAL DATA: Progressive low back and bilateral leg pain.

EXAM:
LUMBAR SPINE - COMPLETE 4+ VIEW

[view not recorded (1 of 6)]
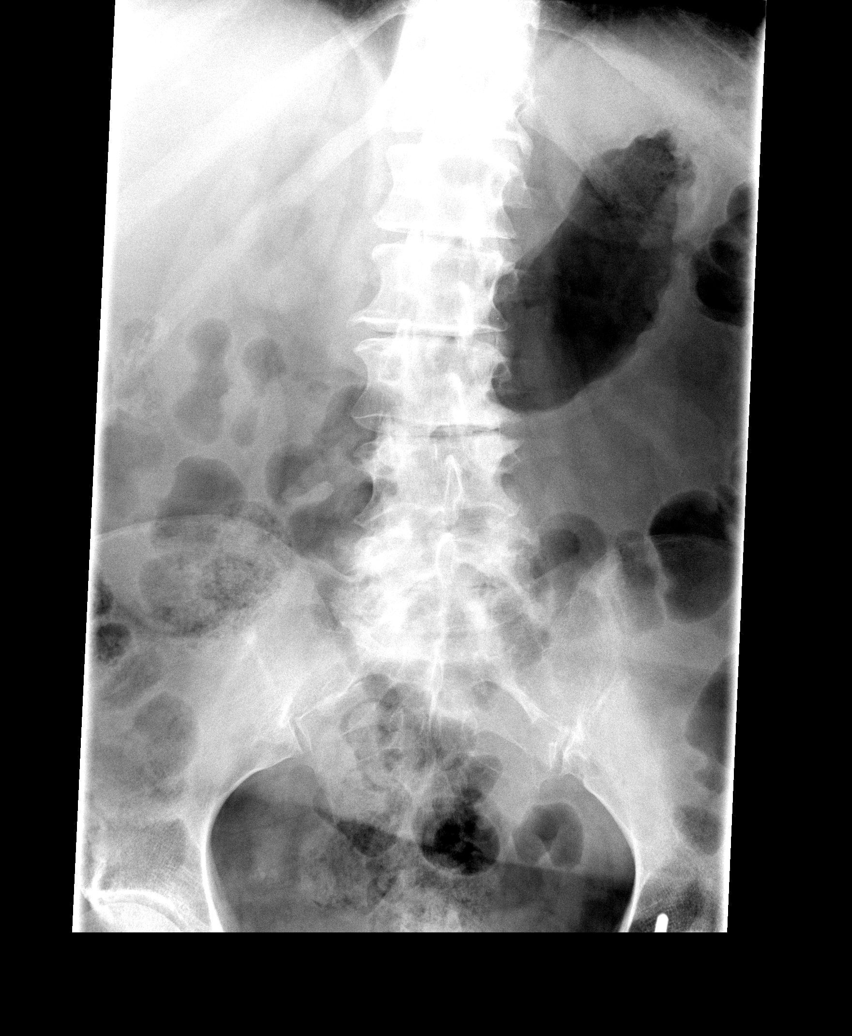

[view not recorded (2 of 6)]
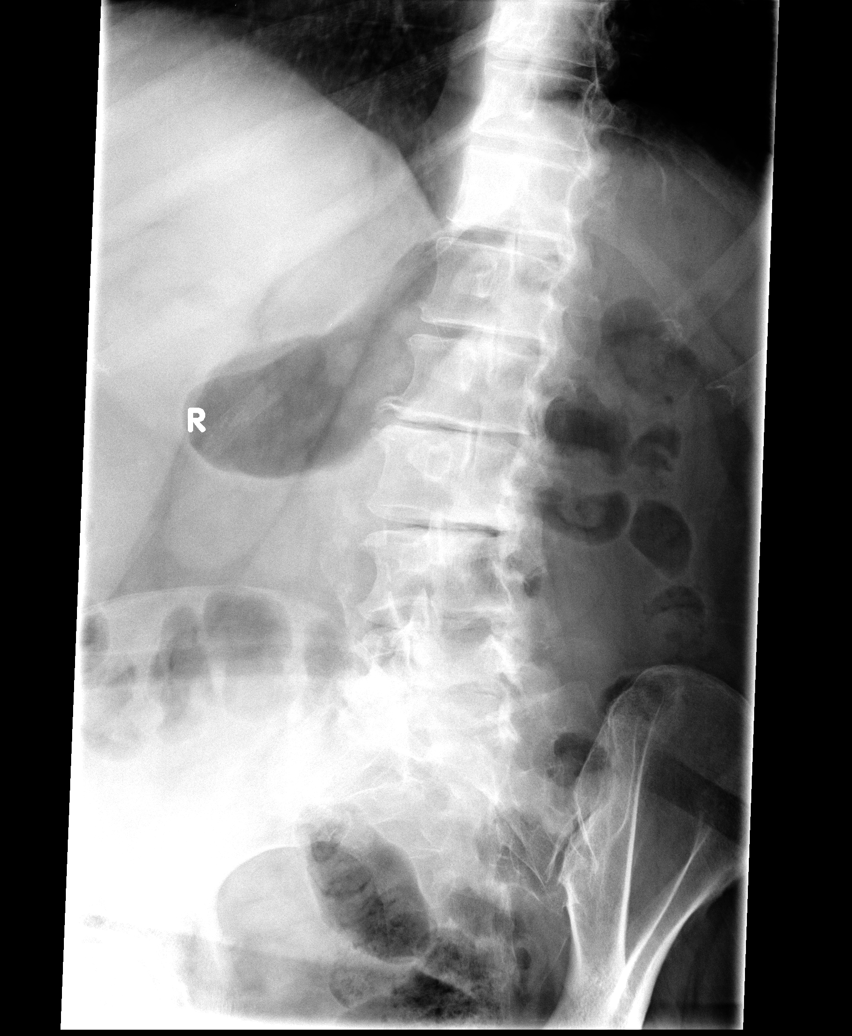

[view not recorded (3 of 6)]
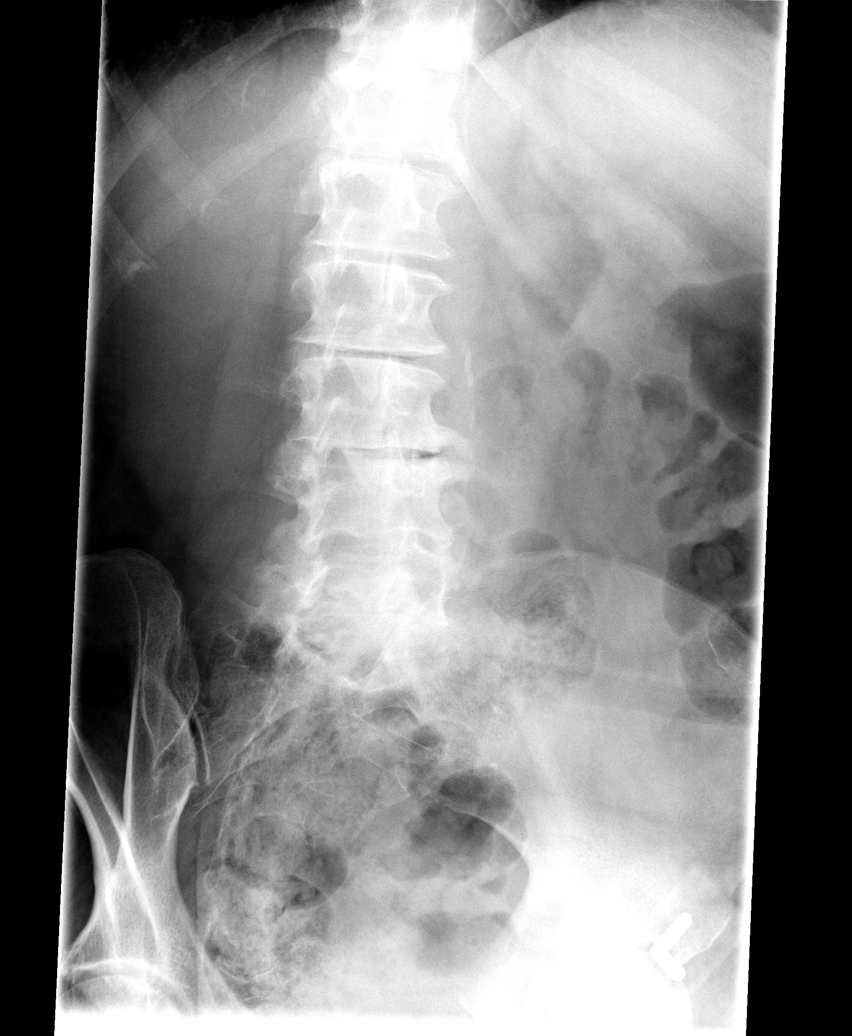

[view not recorded (4 of 6)]
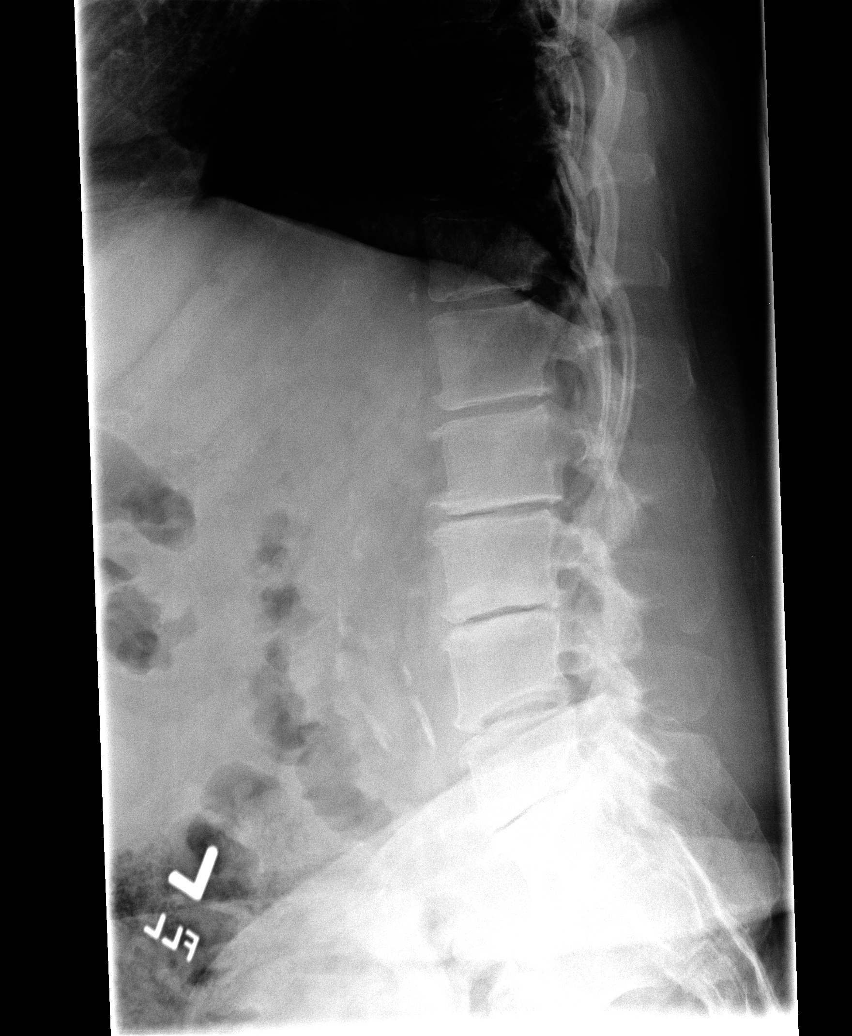

[view not recorded (5 of 6)]
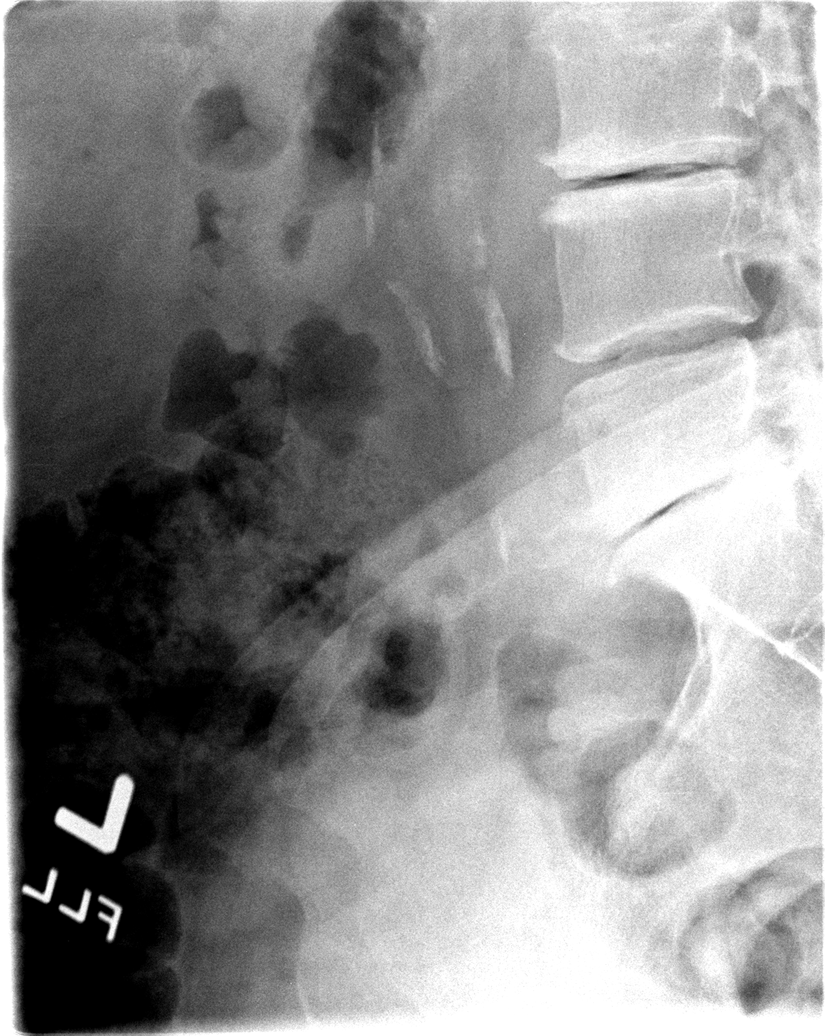

[view not recorded (6 of 6)]
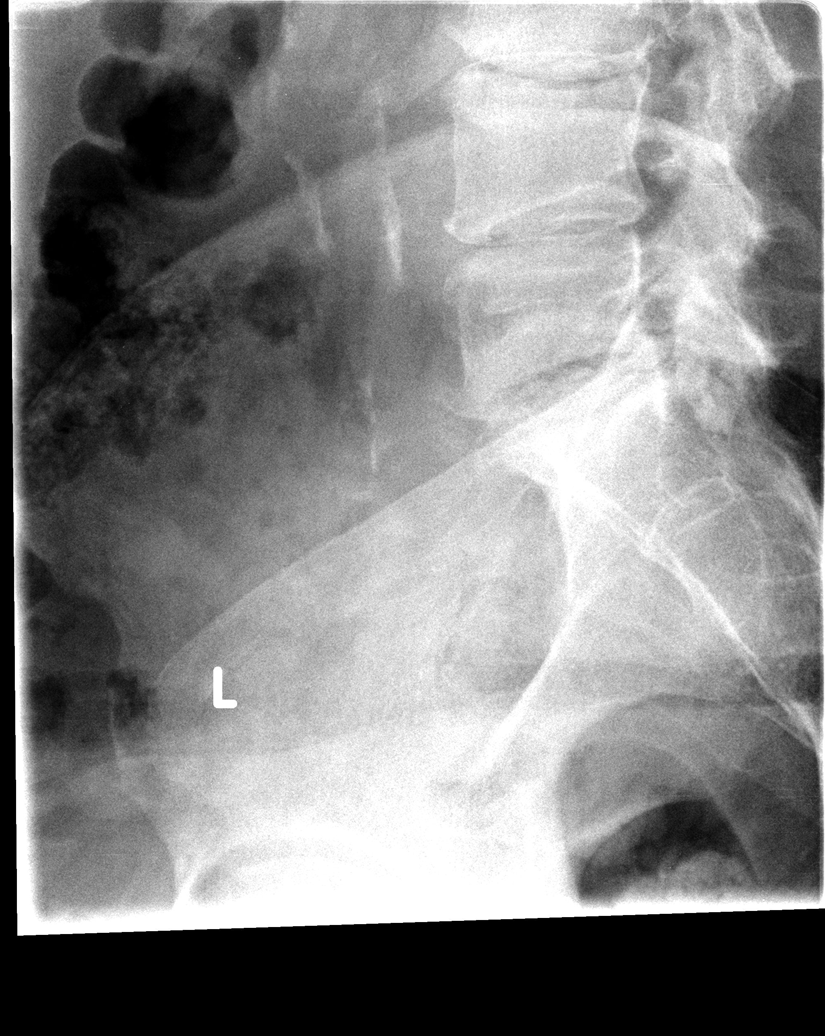

[6 of 6 positions shown; findings below may reference images not displayed]

FINDINGS: The patient has progressive degenerative disc disease at L2-3, L3-4,
L4-5, and L5-S1 with disc space narrowing and facet arthritis at
each level. The facet arthritis is most prominent at L5-S1. There is
no fracture or subluxation or bone destruction. There is moderate
calcification in the abdominal aorta and common iliac arteries.
IMPRESSION: Progressive multilevel degenerative disc and joint disease. No acute
abnormalities.

## 2014-07-17 MED ORDER — OXYCODONE-ACETAMINOPHEN 5-325 MG PO TABS
2.0000 | ORAL_TABLET | Freq: Once | ORAL | Status: AC
  Administered 2014-07-17: 2 via ORAL
  Filled 2014-07-17: qty 2

## 2014-07-17 MED ORDER — OXYCODONE HCL 5 MG PO TABS: 5.0000 mg | ORAL_TABLET | ORAL | Status: DC | PRN

## 2014-07-17 NOTE — ED Provider Notes (Signed)
CSN: [id number]     Arrival date & time 07/17/14  1413 History   First MD Initiated Contact with Patient 07/17/14 1534     Chief Complaint  Patient presents with  . Back Pain     (Consider location/radiation/quality/duration/timing/severity/associated sxs/prior Treatment) HPI Comments: Patient c/o "low back pain" that is constant that she has had for "years" ever since she slipped on ice.  States she has a "slipped disc".  Has chronic pain and takes oxycodone which she has been out of for months since Dr. Arnoldo Morale stopped practicing.  Denies any change in chronic pain pattern.  No fever, vomiting, no incontinence.  Patient radiates down bilateral legs. No focal weakness, numbness, or tingling. Denies new trauma.  No urinary symptoms.  No chest pain or SOB.   The history is provided by the patient.    Past Medical History  Diagnosis Date  . Asthma   . Chronic back pain   . Lumbar radiculopathy   . COPD (chronic obstructive pulmonary disease)     on home O2  . Anxiety   . HOH (hard of hearing)    Past Surgical History  Procedure Laterality Date  . Cesarean section    . Tonsillectomy     Family History  Problem Relation Age of Onset  . COPD Mother   . Cancer Mother     kidney  . Stroke Brother   . Asthma Sister    History  Substance Use Topics  . Smoking status: Current Every Day Smoker -- 0.50 packs/day for 40 years    Types: Cigarettes  . Smokeless tobacco: Not on file  . Alcohol Use: No   OB History   Grav Para Term Preterm Abortions TAB SAB Ect Mult Living                 Review of Systems  Constitutional: Negative for fever, activity change and appetite change.  HENT: Negative for congestion and rhinorrhea.   Eyes: Negative for photophobia.  Respiratory: Negative for cough, chest tightness and shortness of breath.   Cardiovascular: Negative for chest pain.  Gastrointestinal: Negative for nausea, vomiting and abdominal pain.  Genitourinary: Negative for  dysuria, hematuria, vaginal bleeding, vaginal discharge and difficulty urinating.  Musculoskeletal: Positive for back pain. Negative for arthralgias and myalgias.  Skin: Negative for rash.  Neurological: Negative for dizziness, weakness and headaches.  A complete 10 system review of systems was obtained and all systems are negative except as noted in the HPI and PMH.      Allergies  Review of patient's allergies indicates no known allergies.  Home Medications   Prior to Admission medications   Medication Sig Start Date End Date Taking? Authorizing Provider  albuterol (PROAIR HFA) 108 (90 BASE) MCG/ACT inhaler Inhale 2 puffs into the lungs 4 (four) times daily as needed. For shortness of breath    Historical Provider, MD  albuterol (PROVENTIL) (2.5 MG/3ML) 0.083% nebulizer solution Take 2.5 mg by nebulization every 4 (four) hours as needed. For shortness of breath    Historical Provider, MD  ALPRAZolam Duanne Moron) 1 MG tablet Take 1 mg by mouth 4 (four) times daily as needed. For anxiety    Historical Provider, MD  budesonide-formoterol (SYMBICORT) 160-4.5 MCG/ACT inhaler Inhale 1 puff into the lungs 2 (two) times daily.     Historical Provider, MD  cyclobenzaprine (FLEXERIL) 10 MG tablet Take 10 mg by mouth 2 (two) times daily as needed. For muscle spasm    Historical Provider, MD  metoprolol (LOPRESSOR) 50 MG tablet Take 50 mg by mouth 2 (two) times daily.    Historical Provider, MD  oxyCODONE (ROXICODONE) 15 MG immediate release tablet Take 15 mg by mouth every 6 (six) hours as needed for pain.    Historical Provider, MD  oxycodone (ROXICODONE) 30 MG immediate release tablet Take 30 mg by mouth every 6 (six) hours as needed. For severe pain    Historical Provider, MD  oxyCODONE (ROXICODONE) 5 MG immediate release tablet Take 1 tablet (5 mg total) by mouth every 4 (four) hours as needed for severe pain. 07/17/14   Ezequiel Essex, MD  ranitidine (ZANTAC) 150 MG tablet Take 150 mg by mouth 2 (two)  times daily.    Historical Provider, MD  tiotropium (SPIRIVA) 18 MCG inhalation capsule Place 18 mcg into inhaler and inhale daily.    Historical Provider, MD   BP 125/67  Pulse 99  Temp(Src) 97.7 F (36.5 C)  Resp 18  Ht 5\' 9"  (1.753 m)  Wt 203 lb (92.08 kg)  BMI 29.96 kg/m2  SpO2 99% Physical Exam  Nursing note and vitals reviewed. Constitutional: She is oriented to person, place, and time. She appears well-developed and well-nourished. No distress.  HENT:  Head: Normocephalic and atraumatic.  Mouth/Throat: Oropharynx is clear and moist. No oropharyngeal exudate.  Eyes: Conjunctivae and EOM are normal. Pupils are equal, round, and reactive to light.  Neck: Normal range of motion. Neck supple.  No meningismus.  Cardiovascular: Normal rate, regular rhythm, normal heart sounds and intact distal pulses.   No murmur heard. Pulmonary/Chest: Effort normal and breath sounds normal. No respiratory distress.  Abdominal: Soft. There is no tenderness. There is no rebound and no guarding.  Musculoskeletal: Normal range of motion. She exhibits tenderness. She exhibits no edema.  Paraspinal lumbar pain 5/5 strength in bilateral lower extremities. Ankle plantar and dorsiflexion intact. Great toe extension intact bilaterally. +2 DP and PT pulses. +2 patellar reflexes bilaterally. Normal gait.   Neurological: She is alert and oriented to person, place, and time. No cranial nerve deficit. She exhibits normal muscle tone. Coordination normal.  No ataxia on finger to nose bilaterally. No pronator drift. 5/5 strength throughout. CN 2-12 intact. Negative Romberg. Equal grip strength. Sensation intact. Gait is normal.   Skin: Skin is warm.  Psychiatric: She has a normal mood and affect. Her behavior is normal.    ED Course  Procedures (including critical care time) Labs Review Labs Reviewed  URINALYSIS, ROUTINE W REFLEX MICROSCOPIC    Imaging Review Dg Lumbar Spine Complete  07/17/2014    CLINICAL DATA:  Progressive low back and bilateral leg pain.  EXAM: LUMBAR SPINE - COMPLETE 4+ VIEW  COMPARISON:  06/21/2004  FINDINGS: The patient has progressive degenerative disc disease at L2-3, L3-4, L4-5, and L5-S1 with disc space narrowing and facet arthritis at each level. The facet arthritis is most prominent at L5-S1. There is no fracture or subluxation or bone destruction. There is moderate calcification in the abdominal aorta and common iliac arteries.  IMPRESSION: Progressive multilevel degenerative disc and joint disease. No acute abnormalities.   Electronically Signed   By: Rozetta Nunnery M.D.   On: 07/17/2014 16:18     EKG Interpretation None      MDM   Final diagnoses:  Bilateral low back pain without sciatica   Acute on chronic lower back pain.  Out of pain meds.  No focal weakness, numbness, tingling. No incontinence or fever.  No change in chronic pain pattern,  no new trauma.  Patient neurologically intact on exam. No evidence of cauda equina or cord compression. X-ray shows degenerative change. Discussed with patient that the ED couldn't refill her chronic pain medications. She states she is followed at the pain clinic in 2 weeks. She does not have a PCP in longer. We'll give short course of oxycodone in the interim. She is ambulatory and does not have any red flags on neurological exam.  Ezequiel Essex, MD 07/17/14 [phone]

## 2014-07-17 NOTE — ED Notes (Signed)
Ambulated in hall, tolerated well. Rates pain 7.

## 2014-07-17 NOTE — ED Notes (Signed)
Pt c/o lower back pain radiating down both legs x 3 days. Denies gi/gu sx. nad noted.

## 2014-07-17 NOTE — ED Notes (Signed)
Ambulated to bathroom, tolerated well.

## 2014-07-17 NOTE — Discharge Instructions (Signed)
Back Pain, Adult Follow up with your doctor. The ED cannot refill your chronic pain medications. Return to the ED if you develop new or worsening symptoms. Low back pain is very common. About 1 in 5 people have back pain.The cause of low back pain is rarely dangerous. The pain often gets better over time.About half of people with a sudden onset of back pain feel better in just 2 weeks. About 8 in 10 people feel better by 6 weeks.  CAUSES Some common causes of back pain include:  Strain of the muscles or ligaments supporting the spine.  Wear and tear (degeneration) of the spinal discs.  Arthritis.  Direct injury to the back. DIAGNOSIS Most of the time, the direct cause of low back pain is not known.However, back pain can be treated effectively even when the exact cause of the pain is unknown.Answering your caregiver's questions about your overall health and symptoms is one of the most accurate ways to make sure the cause of your pain is not dangerous. If your caregiver needs more information, he or she may order lab work or imaging tests (X-rays or MRIs).However, even if imaging tests show changes in your back, this usually does not require surgery. HOME CARE INSTRUCTIONS For many people, back pain returns.Since low back pain is rarely dangerous, it is often a condition that people can learn to [facility] their own.   Remain active. It is stressful on the back to sit or stand in one place. Do not sit, drive, or stand in one place for more than 30 minutes at a time. Take short walks on level surfaces as soon as pain allows.Try to increase the length of time you walk each day.  Do not stay in bed.Resting more than 1 or 2 days can delay your recovery.  Do not avoid exercise or work.Your body is made to move.It is not dangerous to be active, even though your back may hurt.Your back will likely heal faster if you return to being active before your pain is gone.  Pay attention to your body  when you bend and lift. Many people have less discomfortwhen lifting if they bend their knees, keep the load close to their bodies,and avoid twisting. Often, the most comfortable positions are those that put less stress on your recovering back.  Find a comfortable position to sleep. Use a firm mattress and lie on your side with your knees slightly bent. If you lie on your back, put a pillow under your knees.  Only take over-the-counter or prescription medicines as directed by your caregiver. Over-the-counter medicines to reduce pain and inflammation are often the most helpful.Your caregiver may prescribe muscle relaxant drugs.These medicines help dull your pain so you can more quickly return to your normal activities and healthy exercise.  Put ice on the injured area.  Put ice in a plastic bag.  Place a towel between your skin and the bag.  Leave the ice on for 15-20 minutes, 03-04 times a day for the first 2 to 3 days. After that, ice and heat may be alternated to reduce pain and spasms.  Ask your caregiver about trying back exercises and gentle massage. This may be of some benefit.  Avoid feeling anxious or stressed.Stress increases muscle tension and can worsen back pain.It is important to recognize when you are anxious or stressed and learn ways to manage it.Exercise is a great option. SEEK MEDICAL CARE IF:  You have pain that is not relieved with rest or medicine.  You have pain that does not improve in 1 week.  You have new symptoms.  You are generally not feeling well. SEEK IMMEDIATE MEDICAL CARE IF:   You have pain that radiates from your back into your legs.  You develop new bowel or bladder control problems.  You have unusual weakness or numbness in your arms or legs.  You develop nausea or vomiting.  You develop abdominal pain.  You feel faint. Document Released: 11/28/2005 Document Revised: 05/29/2012 Document Reviewed: 04/01/2014 [facility] Patient  Information 2015 [place], [place]. This information is not intended to replace advice given to you by your health care provider. Make sure you discuss any questions you have with your health care provider.

## 2014-07-22 ENCOUNTER — Encounter (HOSPITAL_COMMUNITY): Payer: Self-pay | Admitting: Emergency Medicine

## 2014-07-22 ENCOUNTER — Emergency Department (HOSPITAL_COMMUNITY)
Admission: EM | Admit: 2014-07-22 | Discharge: 2014-07-22 | Disposition: A | Discharge status: Home / Self Care | Payer: Medicaid Other | Attending: Emergency Medicine | Admitting: Emergency Medicine

## 2014-07-22 DIAGNOSIS — J4489 Other specified chronic obstructive pulmonary disease: Secondary | ICD-10-CM | POA: Insufficient documentation

## 2014-07-22 DIAGNOSIS — G8929 Other chronic pain: Secondary | ICD-10-CM | POA: Diagnosis not present

## 2014-07-22 DIAGNOSIS — H9190 Unspecified hearing loss, unspecified ear: Secondary | ICD-10-CM | POA: Diagnosis not present

## 2014-07-22 DIAGNOSIS — Z9981 Dependence on supplemental oxygen: Secondary | ICD-10-CM | POA: Diagnosis not present

## 2014-07-22 DIAGNOSIS — J449 Chronic obstructive pulmonary disease, unspecified: Secondary | ICD-10-CM | POA: Insufficient documentation

## 2014-07-22 DIAGNOSIS — M545 Low back pain, unspecified: Secondary | ICD-10-CM | POA: Diagnosis not present

## 2014-07-22 DIAGNOSIS — F411 Generalized anxiety disorder: Secondary | ICD-10-CM | POA: Insufficient documentation

## 2014-07-22 DIAGNOSIS — Z79899 Other long term (current) drug therapy: Secondary | ICD-10-CM | POA: Diagnosis not present

## 2014-07-22 DIAGNOSIS — F17200 Nicotine dependence, unspecified, uncomplicated: Secondary | ICD-10-CM | POA: Insufficient documentation

## 2014-07-22 DIAGNOSIS — M549 Dorsalgia, unspecified: Secondary | ICD-10-CM

## 2014-07-22 MED ORDER — OXYCODONE-ACETAMINOPHEN 5-325 MG PO TABS
2.0000 | ORAL_TABLET | Freq: Once | ORAL | Status: AC
  Administered 2014-07-22: 2 via ORAL
  Filled 2014-07-22: qty 2

## 2014-07-22 MED ORDER — OXYCODONE-ACETAMINOPHEN 5-325 MG PO TABS: 1.0000 | ORAL_TABLET | ORAL | Status: DC | PRN

## 2014-07-22 NOTE — Discharge Instructions (Signed)

## 2014-07-22 NOTE — ED Provider Notes (Signed)
CSN: [id number]     Arrival date & time 07/22/14  1041 History   First MD Initiated Contact with Patient 07/22/14 1118     Chief Complaint  Patient presents with  . Back Pain     (Consider location/radiation/quality/duration/timing/severity/associated sxs/prior Treatment) The history is provided by the patient.   Tammy Blair is a 63 y.o. female  presenting with acute on chronic low back pain which she has had for many years.  She describes a distant history of injury after fall to her lower back.  She is currently awaiting her first appointment with Dr. Mee Hives who is a pain specialist in [facility] and her appointment is on August 25.  In the interim, she has run out of her pain medication and has increased pain with radiation down her bilateral legs which is also not new or different.  She denies weakness or numbness in her lower extremities and has had no urinary or bowel incontinence or retention.  Pain is worse with movement.  She describes a near  fall this morning secondary to pain when walking back from the bathroom, but denies any new injury.  Pain is worsened with movement and certain positions.  She does take Flexeril when necessary.     Past Medical History  Diagnosis Date  . Asthma   . Chronic back pain   . Lumbar radiculopathy   . COPD (chronic obstructive pulmonary disease)     on home O2  . Anxiety   . HOH (hard of hearing)    Past Surgical History  Procedure Laterality Date  . Cesarean section    . Tonsillectomy     Family History  Problem Relation Age of Onset  . COPD Mother   . Cancer Mother     kidney  . Stroke Brother   . Asthma Sister    History  Substance Use Topics  . Smoking status: Current Every Day Smoker -- 0.50 packs/day for 40 years    Types: Cigarettes  . Smokeless tobacco: Not on file  . Alcohol Use: No   OB History   Grav Para Term Preterm Abortions TAB SAB Ect Mult Living                 Review of Systems  Constitutional: Negative  for fever.  Respiratory: Negative for shortness of breath.   Cardiovascular: Negative for chest pain and leg swelling.  Gastrointestinal: Negative for abdominal pain, constipation and abdominal distention.  Genitourinary: Negative for dysuria, urgency, frequency, flank pain and difficulty urinating.  Musculoskeletal: Positive for back pain. Negative for gait problem and joint swelling.  Skin: Negative for rash.  Neurological: Negative for weakness and numbness.      Allergies  Review of patient's allergies indicates no known allergies.  Home Medications   Prior to Admission medications   Medication Sig Start Date End Date Taking? Authorizing Provider  albuterol (PROAIR HFA) 108 (90 BASE) MCG/ACT inhaler Inhale 2 puffs into the lungs 4 (four) times daily as needed. For shortness of breath   Yes Historical Provider, MD  albuterol (PROVENTIL) (2.5 MG/3ML) 0.083% nebulizer solution Take 2.5 mg by nebulization every 4 (four) hours as needed. For shortness of breath   Yes Historical Provider, MD  ALPRAZolam Duanne Moron) 1 MG tablet Take 1 mg by mouth 4 (four) times daily as needed. For anxiety   Yes Historical Provider, MD  budesonide-formoterol (SYMBICORT) 160-4.5 MCG/ACT inhaler Inhale 1 puff into the lungs 2 (two) times daily.    Yes  Historical Provider, MD  cyclobenzaprine (FLEXERIL) 10 MG tablet Take 10 mg by mouth 2 (two) times daily as needed. For muscle spasm   Yes Historical Provider, MD  metoprolol (LOPRESSOR) 50 MG tablet Take 50 mg by mouth 2 (two) times daily.   Yes Historical Provider, MD  oxyCODONE (ROXICODONE) 15 MG immediate release tablet Take 15 mg by mouth every 6 (six) hours as needed for pain.   Yes Historical Provider, MD  oxycodone (ROXICODONE) 30 MG immediate release tablet Take 30 mg by mouth every 6 (six) hours as needed. For severe pain   Yes Historical Provider, MD  ranitidine (ZANTAC) 150 MG tablet Take 150 mg by mouth 2 (two) times daily.   Yes Historical Provider, MD   tiotropium (SPIRIVA) 18 MCG inhalation capsule Place 18 mcg into inhaler and inhale daily.   Yes Historical Provider, MD  oxyCODONE-acetaminophen (PERCOCET/ROXICET) 5-325 MG per tablet Take 1 tablet by mouth every 4 (four) hours as needed. 07/22/14   Evalee Jefferson, PA-C   BP 138/75  Pulse 86  Temp(Src) 98.4 F (36.9 C) (Oral)  Resp 20  Ht 5\' 9"  (1.753 m)  Wt 203 lb (92.08 kg)  BMI 29.96 kg/m2  SpO2 95% Physical Exam  Nursing note and vitals reviewed. Constitutional: She appears well-developed and well-nourished.  HENT:  Head: Normocephalic.  Eyes: Conjunctivae are normal.  Neck: Normal range of motion. Neck supple.  Cardiovascular: Normal rate and intact distal pulses.   Pedal pulses normal.  Pulmonary/Chest: Effort normal.  Abdominal: Soft. Bowel sounds are normal. She exhibits no distension and no mass.  Musculoskeletal: Normal range of motion. She exhibits no edema.       Lumbar back: She exhibits tenderness. She exhibits no swelling, no edema and no spasm.  Tender to palpation along bilateral lower back at the for to S1 region.  Neurological: She is alert. She has normal strength. She displays no atrophy and no tremor. No sensory deficit. Gait normal.  Reflex Scores:      Patellar reflexes are 2+ on the right side and 2+ on the left side.      Achilles reflexes are 2+ on the right side and 2+ on the left side. No strength deficit noted in hip and knee flexor and extensor muscle groups.  Ankle flexion and extension intact.  Skin: Skin is warm and dry.  Psychiatric: She has a normal mood and affect.    ED Course  Procedures (including critical care time) Labs Review Labs Reviewed - No data to display  Imaging Review No results found.   EKG Interpretation None      MDM   Final diagnoses:  Chronic back pain    Discussed chronic pain issues with patient and the emergency department is not the appropriate place for pain management.  Patient understands this.  She has  recently lost her PCP as Dr. Arnoldo Morale has retired from practicing medicine and has no other alternatives until she establishes care with Dr. Mee Hives.  I will fill a prescription to help her and told she can make this appointment on August 25.  She was advised that further pain management after this date will need to come from her chronic pain specialist.   No neuro deficit on exam or by history to suggest emergent or surgical presentation.  Also discussed worsened sx that should prompt immediate re-evaluation including distal weakness, bowel/bladder retention/incontinence.          Evalee Jefferson, PA-C 07/22/14 [place], PA-C 07/22/14 1215

## 2014-07-22 NOTE — ED Notes (Signed)
Lower back and bil leg pain starting yesterday after running out of oxycodone.

## 2014-07-23 NOTE — ED Provider Notes (Signed)
Medical screening examination/treatment/procedure(s) were performed by non-physician practitioner and as supervising physician I was immediately available for consultation/collaboration.   EKG Interpretation None        Maudry Diego, MD 07/23/14 [phone]

## 2014-11-20 ENCOUNTER — Ambulatory Visit (INDEPENDENT_AMBULATORY_CARE_PROVIDER_SITE_OTHER): Payer: Medicaid Other | Admitting: Otolaryngology

## 2014-11-20 DIAGNOSIS — H7013 Chronic mastoiditis, bilateral: Secondary | ICD-10-CM

## 2014-11-20 DIAGNOSIS — H9502 Recurrent cholesteatoma of postmastoidectomy cavity, left ear: Secondary | ICD-10-CM

## 2014-12-11 ENCOUNTER — Ambulatory Visit (INDEPENDENT_AMBULATORY_CARE_PROVIDER_SITE_OTHER): Payer: Medicaid Other | Admitting: Otolaryngology

## 2015-01-01 ENCOUNTER — Ambulatory Visit (INDEPENDENT_AMBULATORY_CARE_PROVIDER_SITE_OTHER): Payer: Medicaid Other | Admitting: Otolaryngology

## 2015-05-26 ENCOUNTER — Other Ambulatory Visit (HOSPITAL_COMMUNITY): Payer: Self-pay | Admitting: Family Medicine

## 2015-05-26 DIAGNOSIS — R229 Localized swelling, mass and lump, unspecified: Principal | ICD-10-CM

## 2015-05-26 DIAGNOSIS — IMO0002 Reserved for concepts with insufficient information to code with codable children: Secondary | ICD-10-CM

## 2015-06-23 ENCOUNTER — Encounter (HOSPITAL_COMMUNITY): Payer: Medicaid Other

## 2015-06-30 ENCOUNTER — Inpatient Hospital Stay (HOSPITAL_COMMUNITY): Admission: RE | Admit: 2015-06-30 | Payer: Medicaid Other | Source: Ambulatory Visit

## 2015-08-31 ENCOUNTER — Other Ambulatory Visit (HOSPITAL_COMMUNITY): Payer: Self-pay | Admitting: Family Medicine

## 2015-08-31 DIAGNOSIS — Z1231 Encounter for screening mammogram for malignant neoplasm of breast: Secondary | ICD-10-CM

## 2015-09-02 ENCOUNTER — Ambulatory Visit (HOSPITAL_COMMUNITY): Payer: Medicaid Other

## 2015-10-29 ENCOUNTER — Ambulatory Visit (INDEPENDENT_AMBULATORY_CARE_PROVIDER_SITE_OTHER): Payer: Medicaid Other | Admitting: Otolaryngology

## 2015-10-29 DIAGNOSIS — H7012 Chronic mastoiditis, left ear: Secondary | ICD-10-CM

## 2015-10-29 DIAGNOSIS — H7122 Cholesteatoma of mastoid, left ear: Secondary | ICD-10-CM | POA: Diagnosis not present

## 2015-11-19 ENCOUNTER — Ambulatory Visit (INDEPENDENT_AMBULATORY_CARE_PROVIDER_SITE_OTHER): Payer: Medicaid Other | Admitting: Otolaryngology

## 2015-11-19 DIAGNOSIS — H7012 Chronic mastoiditis, left ear: Secondary | ICD-10-CM

## 2016-02-18 ENCOUNTER — Ambulatory Visit (INDEPENDENT_AMBULATORY_CARE_PROVIDER_SITE_OTHER): Payer: Medicaid Other | Admitting: Otolaryngology

## 2016-03-14 ENCOUNTER — Emergency Department (HOSPITAL_COMMUNITY): Payer: Medicaid Other

## 2016-03-14 ENCOUNTER — Emergency Department (HOSPITAL_COMMUNITY)
Admission: EM | Admit: 2016-03-14 | Discharge: 2016-03-14 | Disposition: A | Discharge status: Home / Self Care | Payer: Medicaid Other | Attending: Emergency Medicine | Admitting: Emergency Medicine

## 2016-03-14 ENCOUNTER — Encounter (HOSPITAL_COMMUNITY): Payer: Self-pay

## 2016-03-14 DIAGNOSIS — R0602 Shortness of breath: Secondary | ICD-10-CM | POA: Diagnosis present

## 2016-03-14 DIAGNOSIS — Z79899 Other long term (current) drug therapy: Secondary | ICD-10-CM | POA: Insufficient documentation

## 2016-03-14 DIAGNOSIS — J45909 Unspecified asthma, uncomplicated: Secondary | ICD-10-CM | POA: Insufficient documentation

## 2016-03-14 DIAGNOSIS — Z79891 Long term (current) use of opiate analgesic: Secondary | ICD-10-CM | POA: Diagnosis not present

## 2016-03-14 DIAGNOSIS — F17210 Nicotine dependence, cigarettes, uncomplicated: Secondary | ICD-10-CM | POA: Insufficient documentation

## 2016-03-14 DIAGNOSIS — J441 Chronic obstructive pulmonary disease with (acute) exacerbation: Secondary | ICD-10-CM | POA: Diagnosis not present

## 2016-03-14 LAB — BASIC METABOLIC PANEL
ANION GAP: 6 (ref 5–15)
BUN: 11 mg/dL (ref 6–20)
CALCIUM: 9.2 mg/dL (ref 8.9–10.3)
CO2: 33 mmol/L — ABNORMAL HIGH (ref 22–32)
Chloride: 99 mmol/L — ABNORMAL LOW (ref 101–111)
Creatinine, Ser: 0.86 mg/dL (ref 0.44–1.00)
GLUCOSE: 162 mg/dL — AB (ref 65–99)
Potassium: 4.4 mmol/L (ref 3.5–5.1)
Sodium: 138 mmol/L (ref 135–145)

## 2016-03-14 LAB — CBC
HCT: 46.6 % — ABNORMAL HIGH (ref 36.0–46.0)
HEMOGLOBIN: 15.3 g/dL — AB (ref 12.0–15.0)
MCH: 31.3 pg (ref 26.0–34.0)
MCHC: 32.8 g/dL (ref 30.0–36.0)
MCV: 95.3 fL (ref 78.0–100.0)
Platelets: 296 10*3/uL (ref 150–400)
RBC: 4.89 MIL/uL (ref 3.87–5.11)
RDW: 13 % (ref 11.5–15.5)
WBC: 8 10*3/uL (ref 4.0–10.5)

## 2016-03-14 IMAGING — CR DG CHEST 1V PORT
1 series · 1 of 1 positions shown · non-contrast
Comparison: [DATE]

CLINICAL DATA: Shortness of breath for 2 days.

EXAM:
PORTABLE CHEST 1 VIEW

[ap portable]
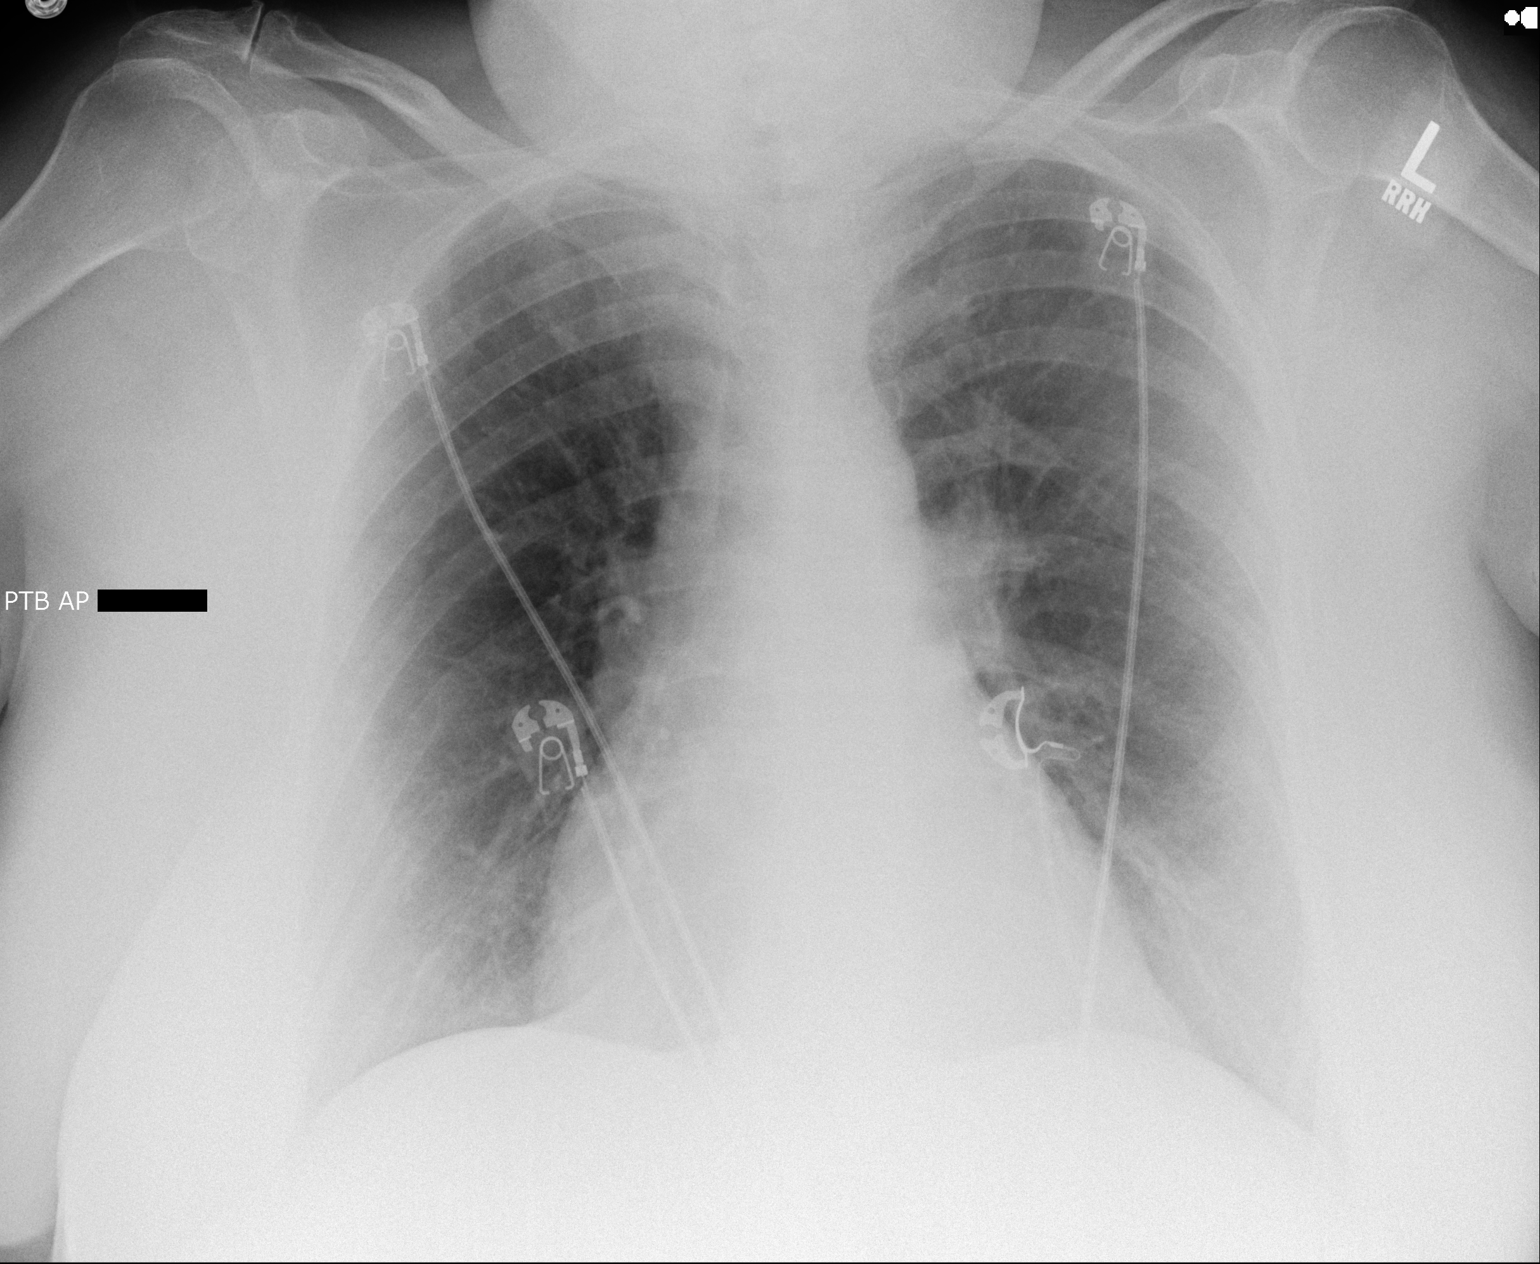

[1 of 1 positions shown; findings below may reference images not displayed]

FINDINGS: Heart and mediastinal contours are within normal limits. No focal
opacities or effusions. No acute bony abnormality.
IMPRESSION: No active disease.

## 2016-03-14 MED ORDER — MAGNESIUM SULFATE 2 GM/50ML IV SOLN
2.0000 g | Freq: Once | INTRAVENOUS | Status: AC
  Administered 2016-03-14: 2 g via INTRAVENOUS
  Filled 2016-03-14: qty 50

## 2016-03-14 MED ORDER — ALBUTEROL (5 MG/ML) CONTINUOUS INHALATION SOLN
10.0000 mg/h | INHALATION_SOLUTION | Freq: Once | RESPIRATORY_TRACT | Status: AC
Start: 2016-03-14 — End: 2016-03-14
  Administered 2016-03-14: 10 mg/h via RESPIRATORY_TRACT
  Filled 2016-03-14: qty 20

## 2016-03-14 MED ORDER — METHYLPREDNISOLONE SODIUM SUCC 125 MG IJ SOLR
125.0000 mg | Freq: Once | INTRAMUSCULAR | Status: AC
  Administered 2016-03-14: 125 mg via INTRAVENOUS
  Filled 2016-03-14: qty 2

## 2016-03-14 MED ORDER — IPRATROPIUM BROMIDE 0.02 % IN SOLN
1.0000 mg | Freq: Once | RESPIRATORY_TRACT | Status: AC
  Administered 2016-03-14: 1 mg via RESPIRATORY_TRACT
  Filled 2016-03-14: qty 5

## 2016-03-14 MED ORDER — SODIUM CHLORIDE 0.9 % IV BOLUS (SEPSIS)
1000.0000 mL | Freq: Once | INTRAVENOUS | Status: AC
  Administered 2016-03-14: 1000 mL via INTRAVENOUS

## 2016-03-14 MED ORDER — SODIUM CHLORIDE 0.9 % IV SOLN
INTRAVENOUS | Status: DC
  Administered 2016-03-14: 19:00:00 via INTRAVENOUS

## 2016-03-14 MED ORDER — DEXTROSE 5 % IV SOLN
500.0000 mg | Freq: Once | INTRAVENOUS | Status: AC
  Administered 2016-03-14: 500 mg via INTRAVENOUS
  Filled 2016-03-14: qty 500

## 2016-03-14 MED ORDER — ALBUTEROL SULFATE HFA 108 (90 BASE) MCG/ACT IN AERS
4.0000 | INHALATION_SPRAY | Freq: Once | RESPIRATORY_TRACT | Status: AC
  Administered 2016-03-14: 4 via RESPIRATORY_TRACT
  Filled 2016-03-14: qty 6.7

## 2016-03-14 MED ORDER — ALBUTEROL SULFATE HFA 108 (90 BASE) MCG/ACT IN AERS: 1.0000 | INHALATION_SPRAY | RESPIRATORY_TRACT | Status: DC | PRN

## 2016-03-14 MED ORDER — PREDNISONE 20 MG PO TABS: ORAL_TABLET | ORAL | Status: DC

## 2016-03-14 MED ORDER — AZITHROMYCIN 250 MG PO TABS: 250.0000 mg | ORAL_TABLET | Freq: Every day | ORAL | Status: DC

## 2016-03-14 MED ORDER — ALBUTEROL SULFATE (2.5 MG/3ML) 0.083% IN NEBU: 2.5000 mg | INHALATION_SOLUTION | RESPIRATORY_TRACT | Status: DC | PRN

## 2016-03-14 MED ORDER — HYDROMORPHONE HCL 1 MG/ML IJ SOLN
1.0000 mg | Freq: Once | INTRAMUSCULAR | Status: AC
  Administered 2016-03-14: 1 mg via INTRAVENOUS
  Filled 2016-03-14: qty 1

## 2016-03-14 NOTE — ED Provider Notes (Signed)
CSN: [id number]     Arrival date & time 03/14/16  1526 History   First MD Initiated Contact with Patient 03/14/16 1533     Chief Complaint  Patient presents with  . Shortness of Breath     (Consider location/radiation/quality/duration/timing/severity/associated sxs/prior Treatment) Patient is a 65 y.o. female presenting with shortness of breath.  Shortness of Breath Severity:  Moderate Onset quality:  Gradual Duration:  3 days Timing:  Constant Progression:  Worsening Chronicity:  Recurrent Context: URI   Context: not activity and not pollens   Ineffective treatments:  Inhaler (ran out of it a couple days ago) Associated symptoms: chest pain (when coughing), cough, sore throat and sputum production   Associated symptoms: no abdominal pain, no fever, no neck pain and no vomiting   Risk factors: no hx of PE/DVT, no prolonged immobilization and no recent surgery     Past Medical History  Diagnosis Date  . Asthma   . Chronic back pain   . Lumbar radiculopathy   . COPD (chronic obstructive pulmonary disease) ([place])     on home O2  . Anxiety   . HOH (hard of hearing)    Past Surgical History  Procedure Laterality Date  . Cesarean section    . Tonsillectomy     Family History  Problem Relation Age of Onset  . COPD Mother   . Cancer Mother     kidney  . Stroke Brother   . Asthma Sister    Social History  Substance Use Topics  . Smoking status: Current Every Day Smoker -- 0.50 packs/day for 40 years    Types: Cigarettes  . Smokeless tobacco: None  . Alcohol Use: No   OB History    No data available     Review of Systems  Constitutional: Negative for fever.  HENT: Positive for sore throat.   Respiratory: Positive for cough, sputum production and shortness of breath.   Cardiovascular: Positive for chest pain (when coughing).  Gastrointestinal: Negative for vomiting and abdominal pain.  Musculoskeletal: Negative for neck pain.  All other systems reviewed and are  negative.     Allergies  Review of patient's allergies indicates no known allergies.  Home Medications   Prior to Admission medications   Medication Sig Start Date End Date Taking? Authorizing Provider  ALPRAZolam Duanne Moron) 1 MG tablet Take 1 mg by mouth 3 (three) times daily as needed for anxiety. For anxiety   Yes Historical Provider, MD  budesonide-formoterol (SYMBICORT) 160-4.5 MCG/ACT inhaler Inhale 1 puff into the lungs 2 (two) times daily.    Yes Historical Provider, MD  cyclobenzaprine (FLEXERIL) 10 MG tablet Take 10 mg by mouth 2 (two) times daily as needed. For muscle spasm   Yes Historical Provider, MD  metoprolol (LOPRESSOR) 50 MG tablet Take 100 mg by mouth 2 (two) times daily.    Yes Historical Provider, MD  oxyCODONE (ROXICODONE) 15 MG immediate release tablet Take 15 mg by mouth every 6 (six) hours as needed for pain.   Yes Historical Provider, MD  ranitidine (ZANTAC) 150 MG tablet Take 150 mg by mouth 2 (two) times daily.   Yes Historical Provider, MD  albuterol (PROVENTIL HFA;VENTOLIN HFA) 108 (90 Base) MCG/ACT inhaler Inhale 1-2 puffs into the lungs every 4 (four) hours as needed for wheezing or shortness of breath. 03/14/16   Merrily Pew, MD  albuterol (PROVENTIL) (2.5 MG/3ML) 0.083% nebulizer solution Take 3 mLs (2.5 mg total) by nebulization every 4 (four) hours as needed. For shortness  of breath 03/14/16   Merrily Pew, MD  azithromycin (ZITHROMAX) 250 MG tablet Take 1 tablet (250 mg total) by mouth daily. 03/15/16   Merrily Pew, MD  predniSONE (DELTASONE) 20 MG tablet 2 tabs po daily x 4 days 03/15/16   Merrily Pew, MD  tiotropium (SPIRIVA) 18 MCG inhalation capsule Place 18 mcg into inhaler and inhale daily.    Historical Provider, MD   BP 121/85 mmHg  Pulse 65  Temp(Src) 97.4 F (36.3 C) (Oral)  Resp 16  Ht '5\' 9"'$  (1.753 m)  Wt 225 lb (102.059 kg)  BMI 33.21 kg/m2  SpO2 93% Physical Exam  Constitutional: She is oriented to person, place, and time. She appears  well-developed and well-nourished.  HENT:  Head: Normocephalic and atraumatic.  Neck: Normal range of motion.  Cardiovascular: Normal rate and regular rhythm.   Pulmonary/Chest: No stridor. Tachypnea noted. She is in respiratory distress. She has decreased breath sounds. She has wheezes.  Abdominal: Soft. She exhibits no distension. There is no tenderness. There is no rebound.  Musculoskeletal: Normal range of motion. She exhibits no edema or tenderness.  Neurological: She is alert and oriented to person, place, and time.  Skin: Skin is warm and dry.  Nursing note and vitals reviewed.   ED Course  Procedures (including critical care time)  CRITICAL CARE Performed by: Merrily Pew   Total critical care time: 35 minutes  Critical care time was exclusive of separately billable procedures and treating other patients.  Critical care was necessary to treat or prevent imminent or life-threatening deterioration.  Critical care was time spent personally by me on the following activities: development of treatment plan with patient and/or surrogate as well as nursing, discussions with consultants, evaluation of patient's response to treatment, examination of patient, obtaining history from patient or surrogate, ordering and performing treatments and interventions, ordering and review of laboratory studies, ordering and review of radiographic studies, pulse oximetry and re-evaluation of patient's condition.   Labs Review Labs Reviewed  BASIC METABOLIC PANEL - Abnormal; Notable for the following:    Chloride 99 (*)    CO2 33 (*)    Glucose, Bld 162 (*)    All other components within normal limits  CBC - Abnormal; Notable for the following:    Hemoglobin 15.3 (*)    HCT 46.6 (*)    All other components within normal limits    Imaging Review Dg Chest Port 1 View  03/14/2016  CLINICAL DATA:  Shortness of breath for 2 days. EXAM: PORTABLE CHEST 1 VIEW COMPARISON:  06/11/2014 FINDINGS:  Heart and mediastinal contours are within normal limits. No focal opacities or effusions. No acute bony abnormality. IMPRESSION: No active disease. Electronically Signed   By: Rolm Baptise M.D.   On: 03/14/2016 16:06   I have personally reviewed and evaluated these images and lab results as part of my medical decision-making.   EKG Interpretation   Date/Time:  Monday March 14 2016 15:44:22 EDT Ventricular Rate:  60 PR Interval:  182 QRS Duration: 100 QT Interval:  430 QTC Calculation: 430 R Axis:   86 Text Interpretation:  Sinus rhythm Borderline right axis deviation  Nonspecific T abnormalities, lateral leads Baseline wander in lead(s) II  Confirmed by [facility] MD, Corene Cornea [phone]) on 03/14/2016 3:54:07 PM      MDM   Final diagnoses:  COPD exacerbation ([place])   Likely COPD exacerbation. Out of albuterol at home. Moderate respiratory distress with tachypnea, decreased BS, wheezing and access muscle usage. On  O2 at home, been using it more.  Plan for continuous albuterol, mag, steroids, azithromycin and reeval for disposition.   1821: Reevaluated and with chronic right lower back pain, still with some decreased BS. Will order albuterol inhaler and reealuate. WOB is much improved.   Reevaluated again and patient still improved. Exam improved. Will continue albuterol/steroids at home.   New Prescriptions: Discharge Medication List as of 03/14/2016  7:04 PM    START taking these medications   Details  azithromycin (ZITHROMAX) 250 MG tablet Take 1 tablet (250 mg total) by mouth daily., Starting 03/15/2016, Until Discontinued, Print    predniSONE (DELTASONE) 20 MG tablet 2 tabs po daily x 4 days, Print        I have personally and contemperaneously reviewed labs and imaging and used in my decision making as above.   A medical screening exam was performed and I feel the patient has had an appropriate workup for their chief complaint at this time and likelihood of emergent condition  existing is low. Their vital signs are stable. They have been counseled on decision, discharge, follow up and which symptoms necessitate immediate return to the emergency department.  They verbally stated understanding and agreement with plan and discharged in stable condition.      Merrily Pew, MD 03/14/16 [phone]

## 2016-03-14 NOTE — Progress Notes (Signed)
Patient attempted to do peak flow 3x and was only able to blow up to 150l/min. Patient was still coughing up tan secretions and having some SOB after the use of her MDI.

## 2016-03-14 NOTE — ED Notes (Signed)
Pt complain of being SOB for two days. States she ran out of her medication for her neb treatment at home last night.

## 2016-08-11 ENCOUNTER — Emergency Department (HOSPITAL_COMMUNITY): Payer: Medicaid Other

## 2016-08-11 ENCOUNTER — Observation Stay (HOSPITAL_COMMUNITY)
Admission: EM | Admit: 2016-08-11 | Discharge: 2016-08-12 | Disposition: A | Discharge status: Home / Self Care | Payer: Medicaid Other | Attending: Family Medicine | Admitting: Family Medicine

## 2016-08-11 ENCOUNTER — Encounter (HOSPITAL_COMMUNITY): Payer: Self-pay

## 2016-08-11 DIAGNOSIS — Z9981 Dependence on supplemental oxygen: Secondary | ICD-10-CM | POA: Diagnosis not present

## 2016-08-11 DIAGNOSIS — J9622 Acute and chronic respiratory failure with hypercapnia: Secondary | ICD-10-CM

## 2016-08-11 DIAGNOSIS — E119 Type 2 diabetes mellitus without complications: Secondary | ICD-10-CM

## 2016-08-11 DIAGNOSIS — F17210 Nicotine dependence, cigarettes, uncomplicated: Secondary | ICD-10-CM | POA: Insufficient documentation

## 2016-08-11 DIAGNOSIS — Z7984 Long term (current) use of oral hypoglycemic drugs: Secondary | ICD-10-CM | POA: Diagnosis not present

## 2016-08-11 DIAGNOSIS — I10 Essential (primary) hypertension: Secondary | ICD-10-CM | POA: Diagnosis not present

## 2016-08-11 DIAGNOSIS — T6591XA Toxic effect of unspecified substance, accidental (unintentional), initial encounter: Secondary | ICD-10-CM | POA: Diagnosis present

## 2016-08-11 DIAGNOSIS — J9602 Acute respiratory failure with hypercapnia: Secondary | ICD-10-CM | POA: Diagnosis not present

## 2016-08-11 DIAGNOSIS — J441 Chronic obstructive pulmonary disease with (acute) exacerbation: Secondary | ICD-10-CM | POA: Diagnosis present

## 2016-08-11 DIAGNOSIS — H9190 Unspecified hearing loss, unspecified ear: Secondary | ICD-10-CM | POA: Insufficient documentation

## 2016-08-11 DIAGNOSIS — G894 Chronic pain syndrome: Secondary | ICD-10-CM | POA: Diagnosis not present

## 2016-08-11 DIAGNOSIS — J9621 Acute and chronic respiratory failure with hypoxia: Secondary | ICD-10-CM | POA: Diagnosis not present

## 2016-08-11 DIAGNOSIS — Z79899 Other long term (current) drug therapy: Secondary | ICD-10-CM | POA: Diagnosis not present

## 2016-08-11 DIAGNOSIS — Z7951 Long term (current) use of inhaled steroids: Secondary | ICD-10-CM | POA: Insufficient documentation

## 2016-08-11 DIAGNOSIS — Z6829 Body mass index (BMI) 29.0-29.9, adult: Secondary | ICD-10-CM | POA: Diagnosis not present

## 2016-08-11 LAB — COMPREHENSIVE METABOLIC PANEL
ALT: 33 U/L (ref 14–54)
AST: 29 U/L (ref 15–41)
Albumin: 4 g/dL (ref 3.5–5.0)
Alkaline Phosphatase: 58 U/L (ref 38–126)
Anion gap: 3 — ABNORMAL LOW (ref 5–15)
BUN: 10 mg/dL (ref 6–20)
CO2: 35 mmol/L — ABNORMAL HIGH (ref 22–32)
Calcium: 8.6 mg/dL — ABNORMAL LOW (ref 8.9–10.3)
Chloride: 98 mmol/L — ABNORMAL LOW (ref 101–111)
Creatinine, Ser: 0.88 mg/dL (ref 0.44–1.00)
GFR calc Af Amer: >60 mL/min (ref >60)
GFR calc non Af Amer: >60 mL/min (ref >60)
Glucose, Bld: 160 mg/dL — ABNORMAL HIGH (ref 65–99)
Potassium: 4.5 mmol/L (ref 3.5–5.1)
Sodium: 136 mmol/L (ref 135–145)
Total Bilirubin: 0.5 mg/dL (ref 0.3–1.2)
Total Protein: 7.5 g/dL (ref 6.5–8.1)

## 2016-08-11 LAB — CBC WITH DIFFERENTIAL/PLATELET
Basophils Absolute: 0 10*3/uL (ref 0.0–0.1)
Basophils Relative: 1 %
Eosinophils Absolute: 1.1 10*3/uL — ABNORMAL HIGH (ref 0.0–0.7)
Eosinophils Relative: 14 %
HCT: 46.8 % — ABNORMAL HIGH (ref 36.0–46.0)
Hemoglobin: 15.4 g/dL — ABNORMAL HIGH (ref 12.0–15.0)
Lymphocytes Relative: 26 %
Lymphs Abs: 2.2 10*3/uL (ref 0.7–4.0)
MCH: 31.7 pg (ref 26.0–34.0)
MCHC: 32.9 g/dL (ref 30.0–36.0)
MCV: 96.3 fL (ref 78.0–100.0)
Monocytes Absolute: 0.9 10*3/uL (ref 0.1–1.0)
Monocytes Relative: 11 %
Neutro Abs: 4.2 10*3/uL (ref 1.7–7.7)
Neutrophils Relative %: 50 %
Platelets: 286 10*3/uL (ref 150–400)
RBC: 4.86 MIL/uL (ref 3.87–5.11)
RDW: 13 % (ref 11.5–15.5)
WBC: 8.5 10*3/uL (ref 4.0–10.5)

## 2016-08-11 LAB — BLOOD GAS, ARTERIAL
Acid-Base Excess: 7.6 mmol/L — ABNORMAL HIGH (ref 0.0–2.0)
Bicarbonate: 28.5 mmol/L — ABNORMAL HIGH (ref 20.0–28.0)
O2 Content: 2 L/min
O2 Saturation: 89.8 %
pCO2 arterial: 77.7 mmHg (ref 32.0–48.0)
pH, Arterial: 7.269 — ABNORMAL LOW (ref 7.350–7.450)
pO2, Arterial: 64.5 mmHg — ABNORMAL LOW (ref 83.0–108.0)

## 2016-08-11 LAB — SALICYLATE LEVEL: Salicylate Lvl: <4 mg/dL (ref 2.8–30.0)

## 2016-08-11 LAB — GLUCOSE, CAPILLARY: Glucose-Capillary: 119 mg/dL — ABNORMAL HIGH (ref 65–99)

## 2016-08-11 LAB — ACETAMINOPHEN LEVEL: Acetaminophen (Tylenol), Serum: <10 ug/mL — ABNORMAL LOW (ref 10–30)

## 2016-08-11 LAB — ETHANOL: Alcohol, Ethyl (B): <5 mg/dL (ref <5)

## 2016-08-11 IMAGING — CR DG CHEST 1V PORT
1 series · 1 of 1 positions shown · non-contrast
Comparison: [DATE]

CLINICAL DATA: Slurred speech, dyspnea.

EXAM:
PORTABLE CHEST 1 VIEW

[ap portable]
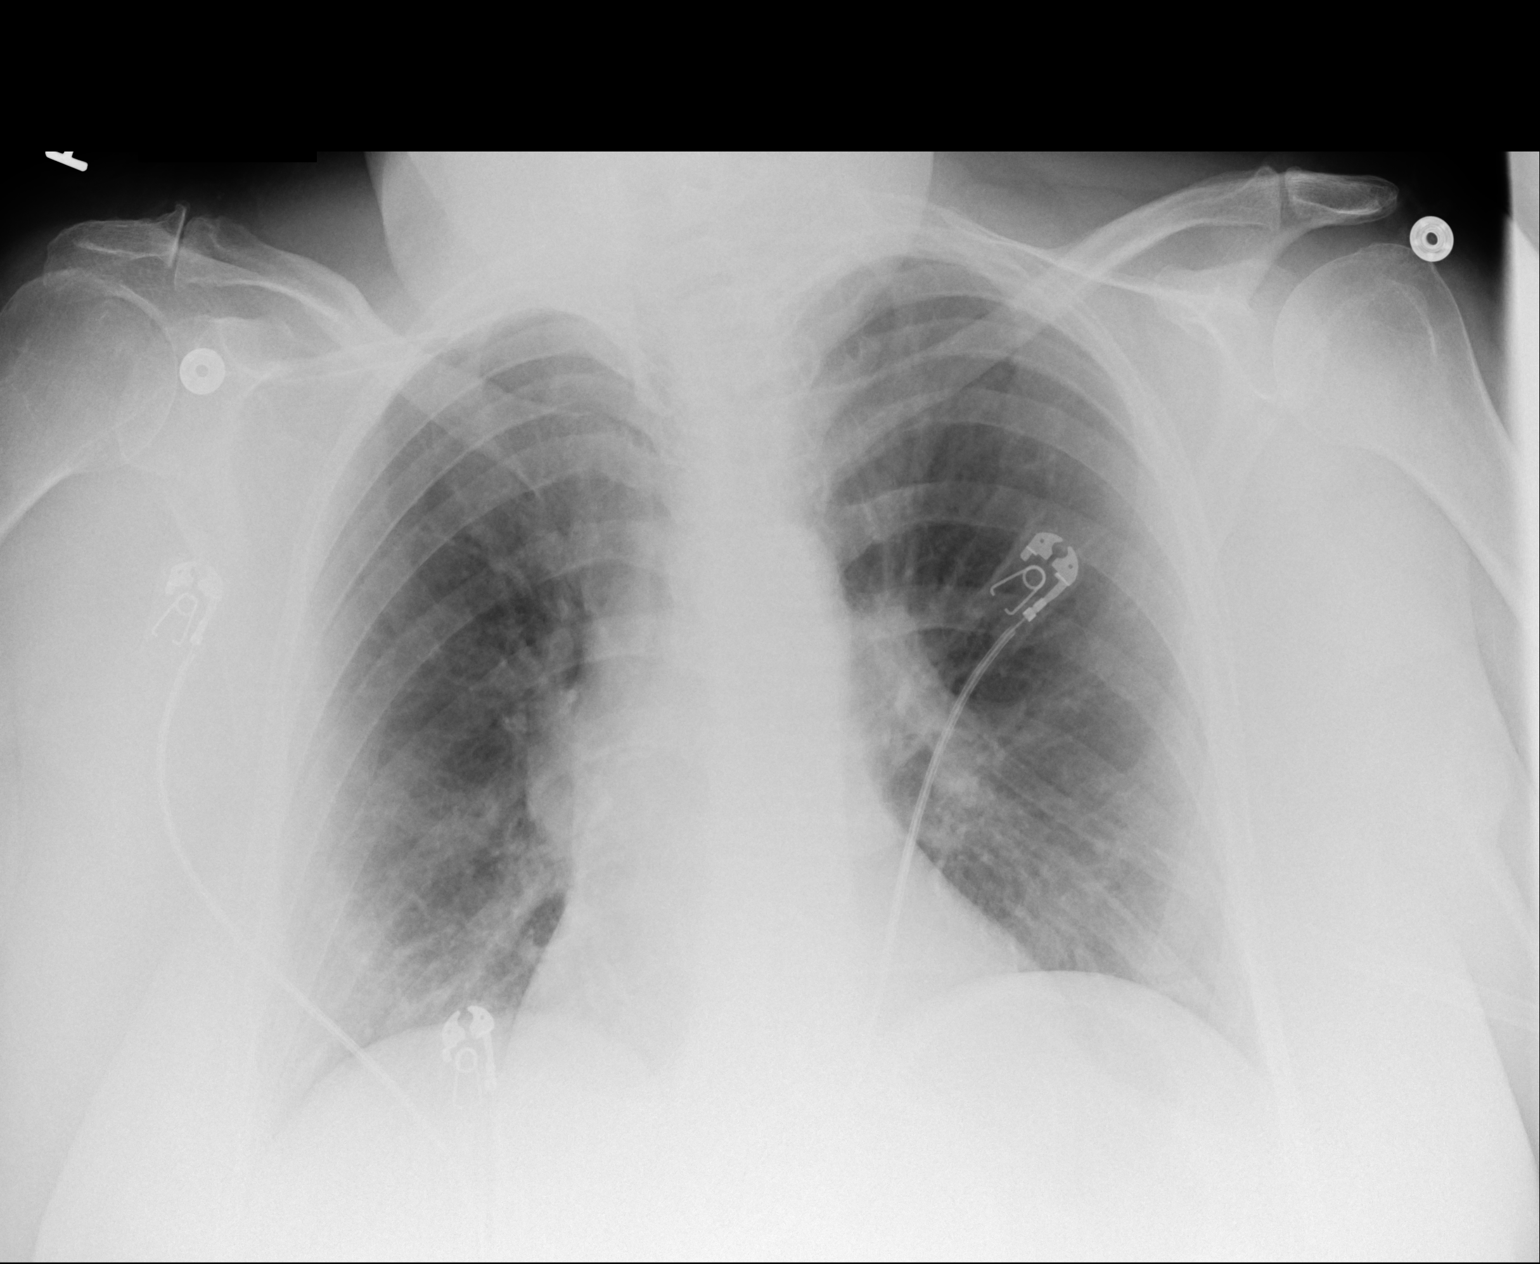

[1 of 1 positions shown; findings below may reference images not displayed]

FINDINGS: The mediastinal contour is normal. The heart size is upper limits
are normal. There is no focal infiltrate, pulmonary edema, or
pleural effusion. The visualized skeletal structures are
unremarkable.
IMPRESSION: No active cardiopulmonary disease.

## 2016-08-11 MED ORDER — METHYLPREDNISOLONE SODIUM SUCC 125 MG IJ SOLR
60.0000 mg | Freq: Four times a day (QID) | INTRAMUSCULAR | Status: DC
  Administered 2016-08-11 – 2016-08-12 (×3): 60 mg via INTRAVENOUS
  Filled 2016-08-11 (×2): qty 2

## 2016-08-11 MED ORDER — GUAIFENESIN ER 600 MG PO TB12
600.0000 mg | ORAL_TABLET | Freq: Two times a day (BID) | ORAL | Status: DC
  Administered 2016-08-11 – 2016-08-12 (×2): 600 mg via ORAL
  Filled 2016-08-11 (×2): qty 1

## 2016-08-11 MED ORDER — ONDANSETRON HCL 4 MG/2ML IJ SOLN: 4.0000 mg | Freq: Four times a day (QID) | INTRAMUSCULAR | Status: DC | PRN

## 2016-08-11 MED ORDER — ALBUTEROL SULFATE (2.5 MG/3ML) 0.083% IN NEBU
2.5000 mg | INHALATION_SOLUTION | Freq: Four times a day (QID) | RESPIRATORY_TRACT | Status: DC
Start: 2016-08-11 — End: 2016-08-11

## 2016-08-11 MED ORDER — METOPROLOL TARTRATE 50 MG PO TABS
100.0000 mg | ORAL_TABLET | Freq: Two times a day (BID) | ORAL | Status: DC
  Administered 2016-08-12: 100 mg via ORAL
  Filled 2016-08-11 (×2): qty 2

## 2016-08-11 MED ORDER — SODIUM CHLORIDE 0.9 % IV BOLUS (SEPSIS)
1000.0000 mL | Freq: Once | INTRAVENOUS | Status: AC
  Administered 2016-08-11: 1000 mL via INTRAVENOUS

## 2016-08-11 MED ORDER — ACETAMINOPHEN 325 MG PO TABS: 650.0000 mg | ORAL_TABLET | Freq: Four times a day (QID) | ORAL | Status: DC | PRN

## 2016-08-11 MED ORDER — IPRATROPIUM-ALBUTEROL 0.5-2.5 (3) MG/3ML IN SOLN
3.0000 mL | Freq: Once | RESPIRATORY_TRACT | Status: AC
  Administered 2016-08-11: 3 mL via RESPIRATORY_TRACT
  Filled 2016-08-11: qty 3

## 2016-08-11 MED ORDER — ONDANSETRON HCL 4 MG PO TABS: 4.0000 mg | ORAL_TABLET | Freq: Four times a day (QID) | ORAL | Status: DC | PRN

## 2016-08-11 MED ORDER — GABAPENTIN 300 MG PO CAPS
300.0000 mg | ORAL_CAPSULE | Freq: Three times a day (TID) | ORAL | Status: DC
  Administered 2016-08-11 – 2016-08-12 (×2): 300 mg via ORAL
  Filled 2016-08-11 (×3): qty 1

## 2016-08-11 MED ORDER — INSULIN ASPART 100 UNIT/ML ~~LOC~~ SOLN: 0.0000 [IU] | Freq: Three times a day (TID) | SUBCUTANEOUS | Status: DC

## 2016-08-11 MED ORDER — SODIUM CHLORIDE 0.9 % IV SOLN: INTRAVENOUS | Status: DC

## 2016-08-11 MED ORDER — IPRATROPIUM BROMIDE 0.02 % IN SOLN: 0.5000 mg | Freq: Four times a day (QID) | RESPIRATORY_TRACT | Status: DC

## 2016-08-11 MED ORDER — IPRATROPIUM-ALBUTEROL 0.5-2.5 (3) MG/3ML IN SOLN
3.0000 mL | Freq: Four times a day (QID) | RESPIRATORY_TRACT | Status: DC
  Administered 2016-08-12: 3 mL via RESPIRATORY_TRACT
  Filled 2016-08-11: qty 3

## 2016-08-11 MED ORDER — NALOXONE HCL 2 MG/2ML IJ SOSY
1.0000 mg | PREFILLED_SYRINGE | Freq: Once | INTRAMUSCULAR | Status: DC
  Filled 2016-08-11: qty 2

## 2016-08-11 MED ORDER — ENOXAPARIN SODIUM 40 MG/0.4ML ~~LOC~~ SOLN
40.0000 mg | SUBCUTANEOUS | Status: DC
  Administered 2016-08-11: 40 mg via SUBCUTANEOUS
  Filled 2016-08-11: qty 0.4

## 2016-08-11 NOTE — ED Notes (Signed)
Dr. Darrick Meigs informed of swallow screen.

## 2016-08-11 NOTE — ED Triage Notes (Signed)
Per EMS, spouse called them due to patient possibly taking to much of her Xanax. Pt drowsy and speech slurred on arrival to ED.

## 2016-08-11 NOTE — ED Notes (Addendum)
MD at bedside. Family at bedside also.

## 2016-08-11 NOTE — ED Provider Notes (Signed)
[name] Provider Note   CSN: [id number] Arrival date & time: 08/11/16  1537     History   Chief Complaint Chief Complaint  Patient presents with  . Drug Overdose    HPI Tammy Blair is a 65 y.o. female.  HPI   Tammy Blair  is a 65 y.o. female, with history of COPD on  home O2, chronic pain syndrome, morbid obesity, tobacco use disorder, diabetes mellitus who was brought to the hospital when she was found unresponsive in the bathroom. As per patient's boyfriend patient took medications at home 12:30 PM, and went to bathroom. When she did not return for an hour he found her somnolent in the bathroom, he was able to pick her up and helped her to go to the bedroom. After the patient became unresponsive and he called 911.   Past Medical History:  Diagnosis Date  . Anxiety   . Asthma   . Chronic back pain   . COPD (chronic obstructive pulmonary disease) ([place])    on home O2  . HOH (hard of hearing)   . Lumbar radiculopathy     Patient Active Problem List   Diagnosis Date Noted  . HOH (hard of hearing) 08/22/2013  . Hyperglycemia 08/21/2013  . COPD exacerbation ([place]) 02/18/2013  . GAD (generalized anxiety disorder) 02/18/2013  . Chronic pain syndrome 02/18/2013  . Tobacco use disorder 02/18/2013  . Chronic respiratory failure ([place]) 02/18/2013  . Obesity, unspecified 02/18/2013  . HYPERTENSION 04/29/2009  . COPD UNSPECIFIED 04/29/2009  . WEIGHT GAIN, ABNORMAL 04/29/2009    Past Surgical History:  Procedure Laterality Date  . CESAREAN SECTION    . TONSILLECTOMY      OB History    No data available       Home Medications    Prior to Admission medications   Medication Sig Start Date End Date Taking? Authorizing Provider  albuterol (PROVENTIL HFA;VENTOLIN HFA) 108 (90 Base) MCG/ACT inhaler Inhale 1-2 puffs into the lungs every 4 (four) hours as needed for wheezing or shortness of breath. 03/14/16  Yes Merrily Pew, MD  albuterol (PROVENTIL) (2.5 MG/3ML)  0.083% nebulizer solution Take 3 mLs (2.5 mg total) by nebulization every 4 (four) hours as needed. For shortness of breath 03/14/16  Yes Merrily Pew, MD  ALPRAZolam Duanne Moron) 1 MG tablet Take 1 mg by mouth 3 (three) times daily as needed for anxiety. For anxiety   Yes Historical Provider, MD  budesonide-formoterol (SYMBICORT) 160-4.5 MCG/ACT inhaler Inhale 1 puff into the lungs 2 (two) times daily.    Yes Historical Provider, MD  cyclobenzaprine (FLEXERIL) 10 MG tablet Take 10 mg by mouth 3 (three) times daily as needed for muscle spasms. For muscle spasm   Yes Historical Provider, MD  diphenhydramine-acetaminophen (TYLENOL PM) 25-500 MG TABS tablet Take 1 tablet by mouth at bedtime as needed (pain/sleep).   Yes Historical Provider, MD  gabapentin (NEURONTIN) 300 MG capsule Take 300 mg by mouth 3 (three) times daily.   Yes Historical Provider, MD  metFORMIN (GLUCOPHAGE-XR) 500 MG 24 hr tablet Take 500 mg by mouth every evening. With supper.   Yes Historical Provider, MD  metoprolol (LOPRESSOR) 100 MG tablet Take 100 mg by mouth 2 (two) times daily.   Yes Historical Provider, MD  oxyCODONE (ROXICODONE) 15 MG immediate release tablet Take 15 mg by mouth every 6 (six) hours as needed for pain.   Yes Historical Provider, MD  ranitidine (ZANTAC) 150 MG tablet Take 150 mg by mouth 2 (two)  times daily.   Yes Historical Provider, MD  tiotropium (SPIRIVA) 18 MCG inhalation capsule Place 18 mcg into inhaler and inhale daily.   Yes Historical Provider, MD  azithromycin (ZITHROMAX) 250 MG tablet Take 1 tablet (250 mg total) by mouth daily. Patient not taking: Reported on 08/11/2016 03/15/16   Merrily Pew, MD  predniSONE (DELTASONE) 20 MG tablet 2 tabs po daily x 4 days Patient not taking: Reported on 08/11/2016 03/15/16   Merrily Pew, MD    Family History Family History  Problem Relation Age of Onset  . COPD Mother   . Cancer Mother     kidney  . Stroke Brother   . Asthma Sister     Social History Social  History  Substance Use Topics  . Smoking status: Current Every Day Smoker    Packs/day: 0.50    Years: 40.00    Types: Cigarettes  . Smokeless tobacco: Never Used  . Alcohol use No     Allergies   Review of patient's allergies indicates not on file.   Review of Systems Review of Systems All systems reviewed and negative, other than as noted in HPI.   Physical Exam Updated Vital Signs BP 109/71   Pulse (!) 59   Temp 98 F (36.7 C) (Oral)   Resp 16   Ht '5\' 9"'$  (1.753 m)   Wt 210 lb (95.3 kg)   SpO2 93%   BMI 31.01 kg/m   Physical Exam  Constitutional: She appears well-developed and well-nourished. No distress.  HENT:  Head: Normocephalic and atraumatic.  Eyes: Conjunctivae are normal. Right eye exhibits no discharge. Left eye exhibits no discharge.  Neck: Neck supple.  Cardiovascular: Normal rate, regular rhythm and normal heart sounds.  Exam reveals no gallop and no friction rub.   No murmur heard. Pulmonary/Chest: Effort normal and breath sounds normal. No respiratory distress.  Abdominal: Soft. She exhibits no distension. There is no tenderness.  Musculoskeletal: She exhibits no edema or tenderness.  Neurological:  Somnolent. Opens eyes to painful stimuli and withdraws all extremities to painful stimuli. Mumbles. Speech is nonsensical.  Skin: Skin is warm and dry.  Nursing note and vitals reviewed.    ED Treatments / Results  Labs (all labs ordered are listed, but only abnormal results are displayed) Labs Reviewed  BLOOD GAS, ARTERIAL - Abnormal; Notable for the following:       Result Value   pH, Arterial 7.269 (*)    pCO2 arterial 77.7 (*)    pO2, Arterial 64.5 (*)    Bicarbonate 28.5 (*)    Acid-Base Excess 7.6 (*)    All other components within normal limits  CBC WITH DIFFERENTIAL/PLATELET - Abnormal; Notable for the following:    Hemoglobin 15.4 (*)    HCT 46.8 (*)    Eosinophils Absolute 1.1 (*)    All other components within normal limits    ACETAMINOPHEN LEVEL - Abnormal; Notable for the following:    Acetaminophen (Tylenol), Serum <10 (*)    All other components within normal limits  COMPREHENSIVE METABOLIC PANEL - Abnormal; Notable for the following:    Chloride 98 (*)    CO2 35 (*)    Glucose, Bld 160 (*)    Calcium 8.6 (*)    Anion gap 3 (*)    All other components within normal limits  COMPREHENSIVE METABOLIC PANEL - Abnormal; Notable for the following:    Chloride 98 (*)    CO2 35 (*)    Glucose, Bld 119 (*)  Calcium 8.5 (*)    Anion gap 3 (*)    All other components within normal limits  GLUCOSE, CAPILLARY - Abnormal; Notable for the following:    Glucose-Capillary 119 (*)    All other components within normal limits  GLUCOSE, CAPILLARY - Abnormal; Notable for the following:    Glucose-Capillary 115 (*)    All other components within normal limits  MRSA PCR SCREENING  SALICYLATE LEVEL  ETHANOL  CBC    EKG  EKG Interpretation  Date/Time:  Thursday August 11 2016 15:40:43 EDT Ventricular Rate:  61 PR Interval:    QRS Duration: 99 QT Interval:  431 QTC Calculation: 435 R Axis:   87 Text Interpretation:  Sinus rhythm Borderline right axis deviation Low voltage, precordial leads Confirmed by Wilson Singer  MD, [place] ([id number]) on 08/11/2016 3:59:25 PM       Radiology Dg Chest Portable 1 View  Result Date: 08/11/2016 CLINICAL DATA:  Slurred speech, dyspnea. EXAM: PORTABLE CHEST 1 VIEW COMPARISON:  March 14, 2016 FINDINGS: The mediastinal contour is normal. The heart size is upper limits are normal. There is no focal infiltrate, pulmonary edema, or pleural effusion. The visualized skeletal structures are unremarkable. IMPRESSION: No active cardiopulmonary disease. Electronically Signed   By: Abelardo Diesel M.D.   On: 08/11/2016 17:01    Procedures Procedures (including critical care time)  CRITICAL CARE Performed by: Virgel Manifold Total critical care time:35 minutes Critical care time was exclusive of  separately billable procedures and treating other patients. Critical care was necessary to treat or prevent imminent or life-threatening deterioration. Critical care was time spent personally by me on the following activities: development of treatment plan with patient and/or surrogate as well as nursing, discussions with consultants, evaluation of patient's response to treatment, examination of patient, obtaining history from patient or surrogate, ordering and performing treatments and interventions, ordering and review of laboratory studies, ordering and review of radiographic studies, pulse oximetry and re-evaluation of patient's condition.   Medications Ordered in ED Medications  ipratropium-albuterol (DUONEB) 0.5-2.5 (3) MG/3ML nebulizer solution 3 mL (3 mLs Nebulization Given 08/11/16 1715)  sodium chloride 0.9 % bolus 1,000 mL (1,000 mLs Intravenous New Bag/Given 08/11/16 1815)     Initial Impression / Assessment and Plan / ED Course  I have reviewed the triage vital signs and the nursing notes.  Pertinent labs & imaging results that were available during my care of the patient were reviewed by me and considered in my medical decision making (see chart for details).  Clinical Course    65 year old female with decreased mental status. Possible overdose. She's also hypercapnic. She was placed on BiPAP with improvement of her mental status. Will admit for ongoing observation and treatment.  Final Clinical Impressions(s) / ED Diagnoses   Final diagnoses:  Acute on chronic respiratory failure with hypercapnia [facility])    New Prescriptions New Prescriptions   No medications on file     Virgel Manifold, MD 08/21/16 1652

## 2016-08-11 NOTE — Progress Notes (Signed)
Patient no longer on BIPAP, MD notified and in agreement. Will continue to monitor.

## 2016-08-11 NOTE — H&P (Signed)
TRH H&P   Patient Demographics:    Tammy Blair, is a 65 y.o. female  MRN: [id number]  DOB - October 07, 1951  Admit Date - 08/11/2016  Outpatient Primary MD for the patient is [name], MD  Referring MD/NP/PA: Dr Wilson Singer  Patient coming from: Home  Chief Complaint  Patient presents with  . Drug Overdose      HPI:    Tammy Blair  is a 65 y.o. female, with history of COPD on  home O2, chronic pain syndrome, morbid obesity, tobacco use disorder, diabetes mellitus who was brought to the hospital when she was found unresponsive in the bathroom. As per patient's boyfriend patient took medications at home 12:30 PM, and went to bathroom. When she did not return for an hour he found her somnolent in the bathroom, he was able to pick her up and helped her to go to the bedroom. After the patient became unresponsive and he called 911.  In the ED, ABG showed pH 7.269, PCO2 77.7, PO2 64.5. Patient was started on BiPAP. She has improved, and communicating. She denies any symptoms. She took 1 tablet of oxycodone this morning.   Review of systems:    In addition to the HPI above,  No Fever-chills, No Headache, No changes with Vision or hearing, No problems swallowing food or Liquids, No Abdominal pain, No Nausea or Vomiting, bowel movements are regular, No Blood in stool or Urine, No dysuria, No new skin rashes or bruises, No new joints pains-aches,  No new weakness, tingling, numbness in any extremity, No recent weight gain or loss, No polyuria, polydypsia or polyphagia, No significant Mental Stressors.  A full 10 point Review of Systems was done, except as stated above, all other Review of Systems were negative.   With Past History of the following :    Past Medical History:  Diagnosis Date  . Anxiety   . Asthma   . Chronic back pain   . COPD (chronic obstructive pulmonary disease) ([place])    on home  O2  . HOH (hard of hearing)   . Lumbar radiculopathy       Past Surgical History:  Procedure Laterality Date  . CESAREAN SECTION    . TONSILLECTOMY        Social History:     Social History  Substance Use Topics  . Smoking status: Current Every Day Smoker    Packs/day: 0.50    Years: 40.00    Types: Cigarettes  . Smokeless tobacco: Never Used  . Alcohol use No        Family History :     Family History  Problem Relation Age of Onset  . COPD Mother   . Cancer Mother     kidney  . Stroke Brother   . Asthma Sister       Home Medications:   Prior to Admission medications   Medication Sig Start Date End Date Taking? Authorizing Provider  albuterol (PROVENTIL HFA;VENTOLIN HFA) 108 (90 Base) MCG/ACT inhaler Inhale 1-2  puffs into the lungs every 4 (four) hours as needed for wheezing or shortness of breath. 03/14/16  Yes Merrily Pew, MD  albuterol (PROVENTIL) (2.5 MG/3ML) 0.083% nebulizer solution Take 3 mLs (2.5 mg total) by nebulization every 4 (four) hours as needed. For shortness of breath 03/14/16  Yes Merrily Pew, MD  ALPRAZolam Duanne Moron) 1 MG tablet Take 1 mg by mouth 3 (three) times daily as needed for anxiety. For anxiety   Yes Historical Provider, MD  budesonide-formoterol (SYMBICORT) 160-4.5 MCG/ACT inhaler Inhale 1 puff into the lungs 2 (two) times daily.    Yes Historical Provider, MD  cyclobenzaprine (FLEXERIL) 10 MG tablet Take 10 mg by mouth 3 (three) times daily as needed for muscle spasms. For muscle spasm   Yes Historical Provider, MD  diphenhydramine-acetaminophen (TYLENOL PM) 25-500 MG TABS tablet Take 1 tablet by mouth at bedtime as needed (pain/sleep).   Yes Historical Provider, MD  gabapentin (NEURONTIN) 300 MG capsule Take 300 mg by mouth 3 (three) times daily.   Yes Historical Provider, MD  metFORMIN (GLUCOPHAGE-XR) 500 MG 24 hr tablet Take 500 mg by mouth every evening. With supper.   Yes Historical Provider, MD  metoprolol (LOPRESSOR) 100 MG tablet  Take 100 mg by mouth 2 (two) times daily.   Yes Historical Provider, MD  oxyCODONE (ROXICODONE) 15 MG immediate release tablet Take 15 mg by mouth every 6 (six) hours as needed for pain.   Yes Historical Provider, MD  ranitidine (ZANTAC) 150 MG tablet Take 150 mg by mouth 2 (two) times daily.   Yes Historical Provider, MD  tiotropium (SPIRIVA) 18 MCG inhalation capsule Place 18 mcg into inhaler and inhale daily.   Yes Historical Provider, MD  azithromycin (ZITHROMAX) 250 MG tablet Take 1 tablet (250 mg total) by mouth daily. Patient not taking: Reported on 08/11/2016 03/15/16   Merrily Pew, MD  predniSONE (DELTASONE) 20 MG tablet 2 tabs po daily x 4 days Patient not taking: Reported on 08/11/2016 03/15/16   Merrily Pew, MD     Allergies:    Not on File   Physical Exam:   Vitals  Blood pressure 123/70, pulse 62, temperature 98 F (36.7 C), temperature source Oral, resp. rate 18, height '5\' 9"'$  (1.753 m), weight 95.3 kg (210 lb), SpO2 96 %.   1. General Morbid obese female lying in bed in NAD, cooperative with exam  2. Normal affect and insight, Awake Alert, Oriented X 3.  3. No F.N deficits, ALL C.Nerves Intact, Strength 5/5 all 4 extremities, Sensation intact all 4 extremities, Plantars down going.  4. Ears and Eyes appear Normal, Conjunctivae clear, PERRLA. Moist Oral Mucosa.  5. Supple Neck, No JVD, No cervical lymphadenopathy appriciated, No Carotid Bruits.  6. Symmetrical Chest wall movement, decreased breath sounds bilaterally, bibasilar rhonchi  7. RRR, No Gallops, Rubs or Murmurs, No Parasternal Heave.No Leg edema  8. Positive Bowel Sounds, Abdomen Soft, No tenderness, No organomegaly appriciated,No rebound -guarding or rigidity.  9.  No Cyanosis, Normal Skin Turgor, No Skin Rash or Bruise.  10. Good muscle tone,  joints appear normal , no effusions, Normal ROM.      Data Review:    CBC  Recent Labs Lab 08/11/16 1700  WBC 8.5  HGB 15.4*  HCT 46.8*  PLT 286    MCV 96.3  MCH 31.7  MCHC 32.9  RDW 13.0  LYMPHSABS 2.2  MONOABS 0.9  EOSABS 1.1*  BASOSABS 0.0   ------------------------------------------------------------------------------------------------------------------  Chemistries   Recent Labs Lab 08/11/16 1700  NA 136  K 4.5  CL 98*  CO2 35*  GLUCOSE 160*  BUN 10  CREATININE 0.88  CALCIUM 8.6*  AST 29  ALT 33  ALKPHOS 58  BILITOT 0.5   ------------------------------------------------------------------------------------------------------------------  ------------------------------------------------------------------------------------------------------------------ GFR: Estimated Creatinine Clearance: 80.4 mL/min (by C-G formula based on SCr of 0.88 mg/dL). Liver Function Tests:  Recent Labs Lab 08/11/16 1700  AST 29  ALT 33  ALKPHOS 58  BILITOT 0.5  PROT 7.5  ALBUMIN 4.0    --------------------------------------------------------------------------------------------------------------- Urine analysis:    Component Value Date/Time   COLORURINE YELLOW 07/17/2014 1613   APPEARANCEUR CLEAR 07/17/2014 1613   LABSPEC 1.010 07/17/2014 1613   PHURINE 6.5 07/17/2014 1613   GLUCOSEU NEGATIVE 07/17/2014 1613   HGBUR NEGATIVE 07/17/2014 1613   BILIRUBINUR NEGATIVE 07/17/2014 1613   KETONESUR NEGATIVE 07/17/2014 1613   PROTEINUR NEGATIVE 07/17/2014 1613   UROBILINOGEN 0.2 07/17/2014 1613   NITRITE NEGATIVE 07/17/2014 1613   LEUKOCYTESUR NEGATIVE 07/17/2014 1613      ----------------------------------------------------------------------------------------------------------------   Imaging Results:    Dg Chest Portable 1 View  Result Date: 08/11/2016 CLINICAL DATA:  Slurred speech, dyspnea. EXAM: PORTABLE CHEST 1 VIEW COMPARISON:  March 14, 2016 FINDINGS: The mediastinal contour is normal. The heart size is upper limits are normal. There is no focal infiltrate, pulmonary edema, or pleural effusion. The visualized  skeletal structures are unremarkable. IMPRESSION: No active cardiopulmonary disease. Electronically Signed   By: Abelardo Diesel M.D.   On: 08/11/2016 17:01    My personal review of EKG: Rhythm NSR   Assessment & Plan:    Active Problems:   COPD exacerbation (HCC)   Chronic pain syndrome   Acute respiratory failure with hypercapnia (HCC)   Diabetes mellitus ([place])   1. Acute hypercapnic respiratory failure- multifactorial from COPD exacerbation as well as opioids. Patient has improved on BiPAP. At this time BiPAP has been removed, patient currently on just oxygen via nasal cannula. Will monitor patient closely in stepdown unit. Will hold oxycodone and Xanax. 2. COPD exacerbation- start Solu-Medrol 60 mg IV every 6 hours, DuoNeb nebulizers q 6 hours 3. Diabetes mellitus- hold metformin, start sliding scale insulin with NovoLog. 4. Chronic pain syndrome- start Tylenol, will hold opioids at this time due to above. Continue Neurontin 300 mg by mouth 3 times a day 5. Hypertension- blood pressure is soft, will start metoprolol from tomorrow morning.   DVT Prophylaxis-   Lovenox   AM Labs Ordered, also please review Full Orders  Family Communication: Admission, patients condition and plan of care including tests being ordered have been discussed with the patient and her boyfriend at bedside* who indicate understanding and agree with the plan and Code Status.  Code Status: Full code  Admission status: Observation    Time spent in minutes : 55 min   Osie Amparo S M.D on 08/11/2016 at 7:39 PM  Between 7am to 7pm - Pager - [phone]. After 7pm go to [email or web address] - password [facility]  [facility] - Office  [phone]

## 2016-08-11 NOTE — ED Notes (Signed)
EDP informed of critical ABG

## 2016-08-11 NOTE — ED Notes (Signed)
Bipap discontinued by Dr. Darrick Meigs.

## 2016-08-12 DIAGNOSIS — J9602 Acute respiratory failure with hypercapnia: Secondary | ICD-10-CM | POA: Diagnosis not present

## 2016-08-12 DIAGNOSIS — J441 Chronic obstructive pulmonary disease with (acute) exacerbation: Secondary | ICD-10-CM | POA: Diagnosis not present

## 2016-08-12 LAB — CBC
HEMATOCRIT: 43.1 % (ref 36.0–46.0)
Hemoglobin: 14 g/dL (ref 12.0–15.0)
MCH: 31.3 pg (ref 26.0–34.0)
MCHC: 32.5 g/dL (ref 30.0–36.0)
MCV: 96.2 fL (ref 78.0–100.0)
PLATELETS: 274 10*3/uL (ref 150–400)
RBC: 4.48 MIL/uL (ref 3.87–5.11)
RDW: 12.9 % (ref 11.5–15.5)
WBC: 9.1 10*3/uL (ref 4.0–10.5)

## 2016-08-12 LAB — COMPREHENSIVE METABOLIC PANEL
ALT: 27 U/L (ref 14–54)
AST: 24 U/L (ref 15–41)
Albumin: 3.7 g/dL (ref 3.5–5.0)
Alkaline Phosphatase: 53 U/L (ref 38–126)
Anion gap: 3 — ABNORMAL LOW (ref 5–15)
BILIRUBIN TOTAL: 0.4 mg/dL (ref 0.3–1.2)
BUN: 11 mg/dL (ref 6–20)
CHLORIDE: 98 mmol/L — AB (ref 101–111)
CO2: 35 mmol/L — ABNORMAL HIGH (ref 22–32)
CREATININE: 0.84 mg/dL (ref 0.44–1.00)
Calcium: 8.5 mg/dL — ABNORMAL LOW (ref 8.9–10.3)
GFR calc Af Amer: >60 mL/min (ref >60)
Glucose, Bld: 119 mg/dL — ABNORMAL HIGH (ref 65–99)
Potassium: 4 mmol/L (ref 3.5–5.1)
Sodium: 136 mmol/L (ref 135–145)
Total Protein: 6.9 g/dL (ref 6.5–8.1)

## 2016-08-12 LAB — MRSA PCR SCREENING: MRSA by PCR: NEGATIVE

## 2016-08-12 LAB — GLUCOSE, CAPILLARY: Glucose-Capillary: 115 mg/dL — ABNORMAL HIGH (ref 65–99)

## 2016-08-12 MED ORDER — IPRATROPIUM-ALBUTEROL 0.5-2.5 (3) MG/3ML IN SOLN
3.0000 mL | Freq: Three times a day (TID) | RESPIRATORY_TRACT | Status: DC
  Administered 2016-08-12: 3 mL via RESPIRATORY_TRACT
  Filled 2016-08-12: qty 3

## 2016-08-12 MED ORDER — PREDNISONE 10 MG PO TABS: ORAL_TABLET | ORAL | 0 refills | Status: DC

## 2016-08-12 NOTE — Progress Notes (Signed)
D/c instructions reviewed with patient. Verbalized understanding. Pt dc'd to home with boyfriend.

## 2016-08-12 NOTE — Progress Notes (Signed)
PROGRESS NOTE  Tammy Blair IRW:[id number] DOB: January 06, 1951 DOA: 08/11/2016 PCP: [name], MD  Brief Narrative: 65 year-old woman with a history of COPD, asthma, diabetes mellitus, HTN, obesity, and tobacco use disorder presented with a drug overdose. She was admitted for acute hypercapnic respiratory failure.   Assessment/Plan: 1. Acute hypercapnic respiratory failure with acute on chronic hypoxic respiratory failure . Secondary to COPD exacerbation. Remains stable off BiPAP. No evidence of misuse of Xanax or Roxicodone. She also takes a muscle relaxer periodically but again denies misuse. No evidence suggest intentional or unintentional overdose. 2. COPD exacerbation, chronic hypoxic respiratory failure on 3 L nasal cannula. Appears very close to baseline at this point. 3. DM. Blood sugars stable.  4. Chronic pain syndrome. Stable. 5. Morbid obesity.  6. Tobacco abuse disorder. Counseled on the importance of cessation.    Appears well. No evidence to suggest worrisome etiology.  Acute issues resolved, discharge home today on steroid taper.  DVT prophylaxis: Lovenox  Code Status: Full  Family Communication: Boyfriend bedside Disposition Plan: Discharge home  Murray Hodgkins, MD  [facility] Direct contact: [phone] --Via [place]  --[email or web address]; password [email or web address]  7PM-7AM contact night coverage as above 08/12/2016, 5:32 AM  LOS: 0 days   Consultants:  None   Procedures:  None   Antimicrobials:  None   HPI/Subjective: Feeling well. Breathing well. Denies pain, nausea, and vomiting. Wants to go home. She denies any misuse of her medication, denies suicidal ideation or suicide attempt "I've never had such feelings". She takes Xanax 3 times a day every day for many years now as well as Roxicodone 3 times a day chronically. She denies loss of consciousness yesterday and reports she simply did not want to talk to her boyfriend. She feels her major problems  with her breathing. She continues to smoke. She allows her boyfriend to participate in the conversation.  Objective: Vitals:   08/12/16 0100 08/12/16 0200 08/12/16 0300 08/12/16 0400  BP: (!) 117/59 (!) 107/46 (!) 117/58 (!) 94/54  Pulse: 63 61 61 65  Resp: '15 15 19 '$ (!) 22  Temp:      TempSrc:      SpO2: 96% 96% 93% 96%  Weight:      Height:        Intake/Output Summary (Last 24 hours) at 08/12/16 0532 Last data filed at 08/11/16 2300  Gross per 24 hour  Intake              250 ml  Output                0 ml  Net              250 ml     Filed Weights   08/11/16 1546 08/11/16 2110  Weight: 95.3 kg (210 lb) 90.1 kg (198 lb 10.2 oz)    Exam:  Constitutional:  . Appears calm and comfortable Respiratory:  . CTA bilaterally, no w/r/r.  . Respiratory effort normal. No retractions or accessory muscle use. Speaks in full sentences. Cardiovascular:  . RRR, no m/r/g . No LE extremity edema   . Telemetry sinus rhythm  Psychiatric:  . judgement and insight appear normal . Mental status o Mood, affect appropriate o Orientation to person, place, time   I have personally reviewed following labs and imaging studies:  CMP and CBC unremarkable   Scheduled Meds: . enoxaparin (LOVENOX) injection  40 mg Subcutaneous Q24H  . gabapentin  300 mg Oral TID  .  guaiFENesin  600 mg Oral BID  . insulin aspart  0-9 Units Subcutaneous TID WC  . ipratropium-albuterol  3 mL Nebulization TID  . methylPREDNISolone (SOLU-MEDROL) injection  60 mg Intravenous Q6H  . metoprolol tartrate  100 mg Oral BID   Continuous Infusions: . sodium chloride      Active Problems:   COPD exacerbation (HCC)   Chronic pain syndrome   Acute respiratory failure with hypercapnia ([place])   Diabetes mellitus ([place])   LOS: 0 days      By signing my name below, I, Collene Leyden, attest that this documentation has been prepared under the direction and in the presence of Murray Hodgkins, MD. Electronically  signed: Collene Leyden, Scribe. 08/12/16 8:59 AM  I personally performed the services described in this documentation. All medical record entries made by the scribe were at my direction. I have reviewed the chart and agree that the record reflects my personal performance and is accurate and complete. Murray Hodgkins, MD

## 2016-08-12 NOTE — Discharge Summary (Signed)
Physician Discharge Summary  Tammy Blair JQB:[id number] DOB: 1950/12/17 DOA: 08/11/2016  PCP: [name], MD  Admit date: 08/11/2016 Discharge date: 08/12/2016  Recommendations for Outpatient Follow-up:  1. Follow-up resolution of COPDExacerbation 2. Follow-up pain and anxiety management  Follow-up Information    CORRINGTON,KIP A, MD. Schedule an appointment as soon as possible for a visit in 1 week(s).   Specialty:  Family Medicine Contact information: [place] [address] [place] [address] [phone]          Discharge Diagnoses:  1. Acute hypercapnic And hypoxemic respiratory failure, superimposed on chronic hypoxic respiratory failure. 2. COPD exacerbation 3. DM type 2 4. Chronic pain syndrome 5. Tobacco use disorder   Discharge Condition: Improved  Disposition:Home   Diet recommendation: Carb modified   Filed Weights   08/11/16 1546 08/11/16 2110 08/12/16 0500  Weight: 95.3 kg (210 lb) 90.1 kg (198 lb 10.2 oz) 90.1 kg (198 lb 10.2 oz)    History of present illness:  65 year-old woman with a history of COPD, asthma, diabetes mellitus, HTN, obesity, and tobacco use disorder presented with hypoxia and respiratory failure. Initially there was uncertainty as to whether misuse of medications or unintentional overdose was a possibility. She was treated with BiPAP but rapidly improved and BiPAP was discontinued in the emergency department.  Hospital Course:  Patient remained stable after admission to the stepdown unit and did not require further BiPAP. Respiratory status returned to baseline with treatment of COPD exacerbation.   She denies any misuse of her medication, denies suicidal ideation or suicide attempt "I've never had such feelings". She takes Xanax 3 times a day every day for many years now as well as Roxicodone 3 times a day chronically. She denies loss of consciousness yesterday and reports she simply did not want to talk to her boyfriend. She feels  her major problems with her breathing. She continues to smoke. She allows her boyfriend to participate in the conversation.  There is no reason to suspect unintentional overdose or misuse at this point. Her hospital physician was uncomplicated and she'll complete treatment for COPD exacerbation as an outpatient.  1. Acute hypercapnic respiratory failure with acute on chronic hypoxic respiratory failure . Secondary to COPD exacerbation. Remains stable off BiPAP. No evidence of misuse of Xanax or Roxicodone. She also takes a muscle relaxer periodically but again denies misuse. No evidence suggest intentional or unintentional overdose. 2. COPD exacerbation, chronic hypoxic respiratory failure on 3 L nasal cannula. Appears very close to baseline at this point. 3. DM. Blood sugars stable.  4. Chronic pain syndrome. Stable. 5. Morbid obesity.  6. Tobacco abuse disorder. Counseled on the importance of cessation.    Consultants:  None   Procedures:  None   Antimicrobials:  None   Discharge Instructions  Discharge Instructions    Diet Carb Modified    Complete by:  As directed   Discharge instructions    Complete by:  As directed   Call your physician or seek immediate medical attention for shortness of breath, wheezing, confusion or worsening of condition.   Increase activity slowly    Complete by:  As directed       Medication List    STOP taking these medications   azithromycin 250 MG tablet Commonly known as:  ZITHROMAX   diphenhydramine-acetaminophen 25-500 MG Tabs tablet Commonly known as:  TYLENOL PM     TAKE these medications   albuterol 108 (90 Base) MCG/ACT inhaler Commonly known as:  PROVENTIL HFA;VENTOLIN HFA Inhale 1-2 puffs into the lungs every 4 (four) hours as needed for wheezing or shortness of breath.   albuterol (2.5 MG/3ML) 0.083% nebulizer solution Commonly known as:  PROVENTIL Take 3 mLs (2.5 mg total) by nebulization every 4 (four) hours as needed.  For shortness of breath   ALPRAZolam 1 MG tablet Commonly known as:  XANAX Take 1 mg by mouth 3 (three) times daily as needed for anxiety. For anxiety   budesonide-formoterol 160-4.5 MCG/ACT inhaler Commonly known as:  SYMBICORT Inhale 1 puff into the lungs 2 (two) times daily.   cyclobenzaprine 10 MG tablet Commonly known as:  FLEXERIL Take 10 mg by mouth 3 (three) times daily as needed for muscle spasms. For muscle spasm   gabapentin 300 MG capsule Commonly known as:  NEURONTIN Take 300 mg by mouth 3 (three) times daily.   metFORMIN 500 MG 24 hr tablet Commonly known as:  GLUCOPHAGE-XR Take 500 mg by mouth every evening. With supper.   metoprolol 100 MG tablet Commonly known as:  LOPRESSOR Take 100 mg by mouth 2 (two) times daily.   oxyCODONE 15 MG immediate release tablet Commonly known as:  ROXICODONE Take 15 mg by mouth every 6 (six) hours as needed for pain.   predniSONE 10 MG tablet Commonly known as:  DELTASONE Take daily by mouth: 40 mg x3 days, then 20 mg x3 days, then 10 mg x3 days, then stop. What changed:  medication strength  additional instructions   ranitidine 150 MG tablet Commonly known as:  ZANTAC Take 150 mg by mouth 2 (two) times daily.   tiotropium 18 MCG inhalation capsule Commonly known as:  SPIRIVA Place 18 mcg into inhaler and inhale daily.      Not on File  The results of significant diagnostics from this hospitalization (including imaging, microbiology, ancillary and laboratory) are listed below for reference.    Significant Diagnostic Studies: Dg Chest Portable 1 View  Result Date: 08/11/2016 CLINICAL DATA:  Slurred speech, dyspnea. EXAM: PORTABLE CHEST 1 VIEW COMPARISON:  March 14, 2016 FINDINGS: The mediastinal contour is normal. The heart size is upper limits are normal. There is no focal infiltrate, pulmonary edema, or pleural effusion. The visualized skeletal structures are unremarkable. IMPRESSION: No active cardiopulmonary  disease. Electronically Signed   By: Abelardo Diesel M.D.   On: 08/11/2016 17:01    Microbiology: Recent Results (from the past 240 hour(s))  MRSA PCR Screening     Status: None   Collection Time: 08/11/16  8:45 PM  Result Value Ref Range Status   MRSA by PCR NEGATIVE NEGATIVE Final    Comment:        The GeneXpert MRSA Assay (FDA approved for NASAL specimens only), is one component of a comprehensive MRSA colonization surveillance program. It is not intended to diagnose MRSA infection nor to guide or monitor treatment for MRSA infections.      Labs: Basic Metabolic Panel:  Recent Labs Lab 08/11/16 1700 08/12/16 0454  NA 136 136  K 4.5 4.0  CL 98* 98*  CO2 35* 35*  GLUCOSE 160* 119*  BUN 10 11  CREATININE 0.88 0.84  CALCIUM 8.6* 8.5*   Liver Function Tests:  Recent Labs Lab 08/11/16 1700 08/12/16 0454  AST 29 24  ALT 33 27  ALKPHOS 58 53  BILITOT 0.5 0.4  PROT 7.5 6.9  ALBUMIN 4.0 3.7   CBC:  Recent Labs Lab 08/11/16 1700 08/12/16 0454  WBC 8.5 9.1  NEUTROABS 4.2  --  HGB 15.4* 14.0  HCT 46.8* 43.1  MCV 96.3 96.2  PLT 286 274    CBG:  Recent Labs Lab 08/11/16 2258 08/12/16 0735  GLUCAP 119* 115*    Active Problems:   COPD exacerbation (HCC)   Chronic pain syndrome   Acute respiratory failure with hypercapnia (HCC)   Diabetes mellitus ([place])   Time coordinating discharge: 35 minutes   Signed:  Murray Hodgkins, MD [facility] 08/12/2016, 12:13 PM  By signing my name below, I, Collene Leyden, attest that this documentation has been prepared under the direction and in the presence of Murray Hodgkins, MD. Electronically signed: Collene Leyden, Scribe. 08/12/16 8:59 AM   I personally performed the services described in this documentation. All medical record entries made by the scribe were at my direction. I have reviewed the chart and agree that the record reflects my personal performance and is accurate and complete. Murray Hodgkins, MD

## 2016-08-12 NOTE — Care Management Note (Signed)
Case Management Note  Patient Details  Name: Tammy Blair MRN: [id number] Date of Birth: October 24, 1951  Subjective/Objective:                  Pt admitted with resp failure. She is from home, lives with her daughter and is ind with ADL's. She reports some difficulty bathing herself and is hesitant to ask her daughter for assistance. Discussed with her that her medicaid would not pay for PT and if she felt her need for assistance was enough she should discuss aid services with her PCP. She has home oxygen and is on 2-3L chronically. She has neb machine, cane and walker. She has PCP, [name], transportation to appointments and medicaid that assists with cost of medications. She plans to return home with self care at DC.   Action/Plan: No CM needs anticipated.   Expected Discharge Date:    08/14/2016              Expected Discharge Plan:  Home/Self Care  In-House Referral:  NA  Discharge planning Services  CM Consult  Post Acute Care Choice:  NA Choice offered to:  NA  DME Arranged:    DME Agency:     HH Arranged:    HH Agency:     Status of Service:  Completed.   If discussed at [place] of Stay Meetings, dates discussed:    Additional Comments:  Sherald Barge, RN 08/12/2016, 7:51 AM

## 2017-01-12 ENCOUNTER — Ambulatory Visit (INDEPENDENT_AMBULATORY_CARE_PROVIDER_SITE_OTHER): Payer: Medicaid Other | Admitting: Otolaryngology

## 2017-01-19 ENCOUNTER — Ambulatory Visit (INDEPENDENT_AMBULATORY_CARE_PROVIDER_SITE_OTHER): Payer: Medicaid Other | Admitting: Otolaryngology

## 2017-01-23 ENCOUNTER — Ambulatory Visit (INDEPENDENT_AMBULATORY_CARE_PROVIDER_SITE_OTHER): Payer: Medicaid Other | Admitting: Otolaryngology

## 2017-06-08 ENCOUNTER — Ambulatory Visit (INDEPENDENT_AMBULATORY_CARE_PROVIDER_SITE_OTHER): Payer: Medicaid Other | Admitting: Otolaryngology

## 2017-06-08 DIAGNOSIS — H95122 Granulation of postmastoidectomy cavity, left ear: Secondary | ICD-10-CM

## 2017-06-08 DIAGNOSIS — H7012 Chronic mastoiditis, left ear: Secondary | ICD-10-CM | POA: Diagnosis not present

## 2017-08-20 ENCOUNTER — Encounter (HOSPITAL_COMMUNITY): Payer: Self-pay | Admitting: *Deleted

## 2017-08-20 ENCOUNTER — Emergency Department (HOSPITAL_COMMUNITY)
Admission: EM | Admit: 2017-08-20 | Discharge: 2017-08-20 | Disposition: A | Discharge status: Home / Self Care | Payer: Medicaid Other | Attending: Emergency Medicine | Admitting: Emergency Medicine

## 2017-08-20 DIAGNOSIS — L03114 Cellulitis of left upper limb: Secondary | ICD-10-CM | POA: Insufficient documentation

## 2017-08-20 DIAGNOSIS — I10 Essential (primary) hypertension: Secondary | ICD-10-CM | POA: Insufficient documentation

## 2017-08-20 DIAGNOSIS — J449 Chronic obstructive pulmonary disease, unspecified: Secondary | ICD-10-CM | POA: Diagnosis not present

## 2017-08-20 DIAGNOSIS — F17210 Nicotine dependence, cigarettes, uncomplicated: Secondary | ICD-10-CM | POA: Insufficient documentation

## 2017-08-20 DIAGNOSIS — Z794 Long term (current) use of insulin: Secondary | ICD-10-CM | POA: Diagnosis not present

## 2017-08-20 DIAGNOSIS — Z79899 Other long term (current) drug therapy: Secondary | ICD-10-CM | POA: Diagnosis not present

## 2017-08-20 DIAGNOSIS — E1165 Type 2 diabetes mellitus with hyperglycemia: Secondary | ICD-10-CM | POA: Diagnosis not present

## 2017-08-20 DIAGNOSIS — M79622 Pain in left upper arm: Secondary | ICD-10-CM | POA: Diagnosis present

## 2017-08-20 DIAGNOSIS — J45909 Unspecified asthma, uncomplicated: Secondary | ICD-10-CM | POA: Insufficient documentation

## 2017-08-20 HISTORY — DX: Essential (primary) hypertension: I10

## 2017-08-20 HISTORY — DX: Type 2 diabetes mellitus without complications: E11.9

## 2017-08-20 LAB — CBC WITH DIFFERENTIAL/PLATELET
BASOS PCT: 0 %
Basophils Absolute: 0 10*3/uL (ref 0.0–0.1)
EOS ABS: 0.9 10*3/uL — AB (ref 0.0–0.7)
EOS PCT: 9 %
HCT: 42.3 % (ref 36.0–46.0)
HEMOGLOBIN: 14 g/dL (ref 12.0–15.0)
LYMPHS ABS: 2.1 10*3/uL (ref 0.7–4.0)
Lymphocytes Relative: 19 %
MCH: 32.5 pg (ref 26.0–34.0)
MCHC: 33.1 g/dL (ref 30.0–36.0)
MCV: 98.1 fL (ref 78.0–100.0)
MONOS PCT: 10 %
Monocytes Absolute: 1.1 10*3/uL — ABNORMAL HIGH (ref 0.1–1.0)
NEUTROS PCT: 62 %
Neutro Abs: 6.8 10*3/uL (ref 1.7–7.7)
PLATELETS: 272 10*3/uL (ref 150–400)
RBC: 4.31 MIL/uL (ref 3.87–5.11)
RDW: 12.7 % (ref 11.5–15.5)
WBC: 11 10*3/uL — ABNORMAL HIGH (ref 4.0–10.5)

## 2017-08-20 LAB — BASIC METABOLIC PANEL
Anion gap: 9 (ref 5–15)
BUN: 7 mg/dL (ref 6–20)
CHLORIDE: 94 mmol/L — AB (ref 101–111)
CO2: 35 mmol/L — ABNORMAL HIGH (ref 22–32)
CREATININE: 0.66 mg/dL (ref 0.44–1.00)
Calcium: 9 mg/dL (ref 8.9–10.3)
Glucose, Bld: 157 mg/dL — ABNORMAL HIGH (ref 65–99)
Potassium: 3.9 mmol/L (ref 3.5–5.1)
SODIUM: 138 mmol/L (ref 135–145)

## 2017-08-20 MED ORDER — CEFTRIAXONE SODIUM 1 G IJ SOLR
1.0000 g | Freq: Once | INTRAMUSCULAR | Status: AC
  Administered 2017-08-20: 1 g via INTRAVENOUS
  Filled 2017-08-20: qty 10

## 2017-08-20 MED ORDER — KETOROLAC TROMETHAMINE 30 MG/ML IJ SOLN
30.0000 mg | Freq: Once | INTRAMUSCULAR | Status: AC
  Administered 2017-08-20: 30 mg via INTRAVENOUS
  Filled 2017-08-20: qty 1

## 2017-08-20 MED ORDER — CEPHALEXIN 500 MG PO CAPS: 500.0000 mg | ORAL_CAPSULE | Freq: Four times a day (QID) | ORAL | 0 refills | Status: DC

## 2017-08-20 MED ORDER — NAPROXEN 500 MG PO TABS: 500.0000 mg | ORAL_TABLET | Freq: Two times a day (BID) | ORAL | 0 refills | Status: DC

## 2017-08-20 MED ORDER — MORPHINE SULFATE (PF) 4 MG/ML IV SOLN
4.0000 mg | Freq: Once | INTRAVENOUS | Status: AC
  Administered 2017-08-20: 4 mg via INTRAVENOUS
  Filled 2017-08-20: qty 1

## 2017-08-20 NOTE — ED Triage Notes (Signed)
Pt states that she may have gotten bitten by a spider this past Thursday evening, c/o pain,redness and swelling to left elbow area. Pt pulse ox in triage 85% but pt states that she is suppose to wear oxygen all the time but did not bring it with her tonight,

## 2017-08-20 NOTE — Discharge Instructions (Signed)
Please take Keflex, 500 mg every 6 hours for the next 10 days Please take ibuprofen or Naprosyn for pain or fever You must have your doctor follow-up if you within 48 hours for a check otherwise please return to the emergency department if you cannot see your doctor or if your symptoms are worsening including the following: Increased fevers, increased pain, spreading redness, increased swelling, numbness or weakness to the hand

## 2017-08-20 NOTE — ED Provider Notes (Signed)
[facility] DEPT Provider Note   CSN: [id number] Arrival date & time: 08/20/17  2016     History   Chief Complaint Chief Complaint  Patient presents with  . Elbow Pain    HPI SHEBRA MULDROW is a 66 y.o. female.  HPI  The patient is a 66 year old female with a history of reactive airway disease and COPD on home oxygen, also has a history of high blood pressure and diabetes. She presents to the hospital after 3 days of gradual onset, gradually worsening and persistent redness and swelling of her left arm at the elbow. She reports that this started slowly and has progressively become more swollen, more red and more tender. She denies having fevers, denies vomiting or diarrhea. She has had some increased pain as this has spread and came to the hospital tonight only because her son came to the house and found her to be in pain. She did not come with her oxygen which she wears chronically and states that she was slightly short of breath by the time she arrived but this was related to oxygen requirement and not a new respiratory process according to the patient. She feels better after oxygen was administered on site.She does not know if she had an injury to the sect bite or an ingrown hair.  Past Medical History:  Diagnosis Date  . Anxiety   . Asthma   . Chronic back pain   . COPD (chronic obstructive pulmonary disease) ([place])    on home O2  . Diabetes mellitus without complication ([place])   . HOH (hard of hearing)   . Hypertension   . Lumbar radiculopathy     Patient Active Problem List   Diagnosis Date Noted  . Acute respiratory failure with hypercapnia ([place]) 08/11/2016  . Diabetes mellitus ([place]) 08/11/2016  . HOH (hard of hearing) 08/22/2013  . Hyperglycemia 08/21/2013  . COPD exacerbation ([place]) 02/18/2013  . GAD (generalized anxiety disorder) 02/18/2013  . Chronic pain syndrome 02/18/2013  . Tobacco use disorder 02/18/2013  . Chronic respiratory failure ([place]) 02/18/2013  .  Obesity, unspecified 02/18/2013  . HYPERTENSION 04/29/2009  . COPD UNSPECIFIED 04/29/2009  . WEIGHT GAIN, ABNORMAL 04/29/2009    Past Surgical History:  Procedure Laterality Date  . CESAREAN SECTION    . TONSILLECTOMY      OB History    No data available       Home Medications    Prior to Admission medications   Medication Sig Start Date End Date Taking? Authorizing Provider  albuterol (PROVENTIL HFA;VENTOLIN HFA) 108 (90 Base) MCG/ACT inhaler Inhale 1-2 puffs into the lungs every 4 (four) hours as needed for wheezing or shortness of breath. 03/14/16  Yes Mesner, Corene Cornea, MD  albuterol (PROVENTIL) (2.5 MG/3ML) 0.083% nebulizer solution Take 3 mLs (2.5 mg total) by nebulization every 4 (four) hours as needed. For shortness of breath 03/14/16  Yes Mesner, Corene Cornea, MD  ALPRAZolam Duanne Moron) 0.5 MG tablet Take 0.5 mg by mouth 3 (three) times daily as needed for anxiety. For anxiety   Yes [provider]  budesonide-formoterol (SYMBICORT) 160-4.5 MCG/ACT inhaler Inhale 1 puff into the lungs 2 (two) times daily.    Yes [provider]  cyclobenzaprine (FLEXERIL) 10 MG tablet Take 10 mg by mouth 3 (three) times daily as needed for muscle spasms. For muscle spasm   Yes [provider]  gabapentin (NEURONTIN) 300 MG capsule Take 300 mg by mouth 3 (three) times daily.   Yes [provider]  metFORMIN (GLUCOPHAGE-XR) 500 MG 24 hr tablet Take 500 mg by mouth every evening. With supper.   Yes [provider]  metoprolol (LOPRESSOR) 100 MG tablet Take 100 mg by mouth 2 (two) times daily.   Yes [provider]  oxyCODONE (ROXICODONE) 15 MG immediate release tablet Take 15 mg by mouth every 6 (six) hours as needed for pain.   Yes [provider]  ranitidine (ZANTAC) 150 MG tablet Take 150 mg by mouth 2 (two) times daily.   Yes [provider]  tiotropium (SPIRIVA) 18 MCG inhalation capsule Place 18 mcg into inhaler and inhale daily.   Yes  [provider]  cephALEXin (KEFLEX) 500 MG capsule Take 1 capsule (500 mg total) by mouth 4 (four) times daily. 08/20/17   Noemi Chapel, MD  naproxen (NAPROSYN) 500 MG tablet Take 1 tablet (500 mg total) by mouth 2 (two) times daily with a meal. 08/20/17   Noemi Chapel, MD    Family History Family History  Problem Relation Age of Onset  . COPD Mother   . Cancer Mother        kidney  . Stroke Brother   . Asthma Sister     Social History Social History  Substance Use Topics  . Smoking status: Current Every Day Smoker    Packs/day: 0.50    Years: 40.00    Types: Cigarettes  . Smokeless tobacco: Never Used  . Alcohol use No     Allergies   Patient has no known allergies.   Review of Systems Review of Systems  All other systems reviewed and are negative.    Physical Exam Updated Vital Signs BP 135/60   Pulse (!) 59   Temp 98.7 F (37.1 C) (Oral)   Resp (!) 28   Ht 5\' 9"  (1.753 m)   Wt 95.3 kg (210 lb)   SpO2 95%   BMI 31.01 kg/m   Physical Exam  Constitutional: She appears well-developed and well-nourished. No distress.  HENT:  Head: Normocephalic and atraumatic.  Mouth/Throat: Oropharynx is clear and moist. No oropharyngeal exudate.  Eyes: Pupils are equal, round, and reactive to light. Conjunctivae and EOM are normal. Right eye exhibits no discharge. Left eye exhibits no discharge. No scleral icterus.  Neck: Normal range of motion. Neck supple. No JVD present. No thyromegaly present.  Cardiovascular: Normal rate, regular rhythm, normal heart sounds and intact distal pulses.  Exam reveals no gallop and no friction rub.   No murmur heard. Pulmonary/Chest: Effort normal. No respiratory distress. She has wheezes. She has no rales.  Abdominal: Soft. Bowel sounds are normal. She exhibits no distension and no mass. There is no tenderness.  Musculoskeletal: Normal range of motion. She exhibits tenderness. She exhibits no edema or deformity.  There is normal  supination and pronation of the left forearm without any pain at the elbow, there is normal flexion and extension of the arm without pain at the elbow, there is tenderness to palpation over the olecranon as well as the peri-olecranon tissues and the soft tissue surrounding the elbow. There is some redness and warmth to the soft tissues with a line of demarcation spreading over to the volar surface of the forearm. There is minimal spread proximal to the elbow, redness extends approximately half way down the forearm towards the wrist. There is no fluctuant collections, no induration  Lymphadenopathy:    She has no cervical adenopathy.  Neurological: She is alert. Coordination normal.  Skin: Skin is warm and dry.  No rash noted. No erythema.  Psychiatric: She has a normal mood and affect. Her behavior is normal.  Nursing note and vitals reviewed.    ED Treatments / Results  Labs (all labs ordered are listed, but only abnormal results are displayed) Labs Reviewed  CBC WITH DIFFERENTIAL/PLATELET - Abnormal; Notable for the following:       Result Value   WBC 11.0 (*)    Monocytes Absolute 1.1 (*)    Eosinophils Absolute 0.9 (*)    All other components within normal limits  BASIC METABOLIC PANEL - Abnormal; Notable for the following:    Chloride 94 (*)    CO2 35 (*)    Glucose, Bld 157 (*)    All other components within normal limits     Radiology No results found.  Procedures Procedures (including critical care time)  Medications Ordered in ED Medications  cefTRIAXone (ROCEPHIN) 1 g in dextrose 5 % 50 mL IVPB (0 g Intravenous Stopped 08/20/17 2131)  morphine 4 MG/ML injection 4 mg (4 mg Intravenous Given 08/20/17 2053)  ketorolac (TORADOL) 30 MG/ML injection 30 mg (30 mg Intravenous Given 08/20/17 2053)     Initial Impression / Assessment and Plan / ED Course  I have reviewed the triage vital signs and the nursing notes.  Pertinent labs & imaging results that were available during my  care of the patient were reviewed by me and considered in my medical decision making (see chart for details).     The patient appears to have a cellulitis, she does not have a fever, she is not tachycardic, I suspect that this is a staph infection without the presence of a purulent collection I suspect this is going to be methicillin susceptible staph aureus. I will start with Rocephin, check some basic labs that she is a diabetic and wanted make sure that she is not hyperglycemic or in renal failure as that would change things. She has been taking oral fluids well and does not feel dehydrated. Pain medications ordered.  Labs reassuring, minimal white count of 11,000, glucose of 157, normal renal function.the patient was informed of all of the indications for return including swelling, redness, pain, numbness, fevers. She agrees, a line was drawn with a surgical marker around her infection and she is aware of what to look for. Recommended 48 hour wound check, home with Keflex  O2 up to 93% on her home O2  Final Clinical Impressions(s) / ED Diagnoses   Final diagnoses:  Cellulitis of left upper extremity    New Prescriptions New Prescriptions   CEPHALEXIN (KEFLEX) 500 MG CAPSULE    Take 1 capsule (500 mg total) by mouth 4 (four) times daily.   NAPROXEN (NAPROSYN) 500 MG TABLET    Take 1 tablet (500 mg total) by mouth 2 (two) times daily with a meal.     Noemi Chapel, MD 08/20/17 2138

## 2017-10-07 ENCOUNTER — Emergency Department (HOSPITAL_COMMUNITY): Payer: Medicaid Other

## 2017-10-07 ENCOUNTER — Encounter (HOSPITAL_COMMUNITY): Payer: Self-pay | Admitting: *Deleted

## 2017-10-07 ENCOUNTER — Inpatient Hospital Stay (HOSPITAL_COMMUNITY): Payer: Medicaid Other

## 2017-10-07 ENCOUNTER — Inpatient Hospital Stay (HOSPITAL_COMMUNITY)
Admission: EM | Admit: 2017-10-07 | Discharge: 2017-10-12 | DRG: 871 | Disposition: A | Discharge status: Home Health | Payer: Medicaid Other | Attending: Internal Medicine | Admitting: Internal Medicine

## 2017-10-07 DIAGNOSIS — J9622 Acute and chronic respiratory failure with hypercapnia: Secondary | ICD-10-CM | POA: Diagnosis present

## 2017-10-07 DIAGNOSIS — J9610 Chronic respiratory failure, unspecified whether with hypoxia or hypercapnia: Secondary | ICD-10-CM | POA: Diagnosis present

## 2017-10-07 DIAGNOSIS — N179 Acute kidney failure, unspecified: Secondary | ICD-10-CM | POA: Diagnosis present

## 2017-10-07 DIAGNOSIS — J9621 Acute and chronic respiratory failure with hypoxia: Secondary | ICD-10-CM | POA: Diagnosis present

## 2017-10-07 DIAGNOSIS — H9190 Unspecified hearing loss, unspecified ear: Secondary | ICD-10-CM | POA: Diagnosis present

## 2017-10-07 DIAGNOSIS — F17210 Nicotine dependence, cigarettes, uncomplicated: Secondary | ICD-10-CM | POA: Diagnosis present

## 2017-10-07 DIAGNOSIS — Z7984 Long term (current) use of oral hypoglycemic drugs: Secondary | ICD-10-CM

## 2017-10-07 DIAGNOSIS — F05 Delirium due to known physiological condition: Secondary | ICD-10-CM | POA: Diagnosis present

## 2017-10-07 DIAGNOSIS — J9612 Chronic respiratory failure with hypercapnia: Secondary | ICD-10-CM | POA: Diagnosis not present

## 2017-10-07 DIAGNOSIS — I491 Atrial premature depolarization: Secondary | ICD-10-CM | POA: Diagnosis present

## 2017-10-07 DIAGNOSIS — Z7951 Long term (current) use of inhaled steroids: Secondary | ICD-10-CM

## 2017-10-07 DIAGNOSIS — W19XXXA Unspecified fall, initial encounter: Secondary | ICD-10-CM | POA: Diagnosis present

## 2017-10-07 DIAGNOSIS — G9341 Metabolic encephalopathy: Secondary | ICD-10-CM | POA: Diagnosis present

## 2017-10-07 DIAGNOSIS — F411 Generalized anxiety disorder: Secondary | ICD-10-CM | POA: Diagnosis present

## 2017-10-07 DIAGNOSIS — I10 Essential (primary) hypertension: Secondary | ICD-10-CM | POA: Diagnosis present

## 2017-10-07 DIAGNOSIS — R40243 Glasgow coma scale score 3-8, unspecified time: Secondary | ICD-10-CM | POA: Diagnosis present

## 2017-10-07 DIAGNOSIS — M5416 Radiculopathy, lumbar region: Secondary | ICD-10-CM | POA: Diagnosis present

## 2017-10-07 DIAGNOSIS — Z9981 Dependence on supplemental oxygen: Secondary | ICD-10-CM

## 2017-10-07 DIAGNOSIS — Y92009 Unspecified place in unspecified non-institutional (private) residence as the place of occurrence of the external cause: Secondary | ICD-10-CM | POA: Diagnosis not present

## 2017-10-07 DIAGNOSIS — J440 Chronic obstructive pulmonary disease with acute lower respiratory infection: Secondary | ICD-10-CM | POA: Diagnosis present

## 2017-10-07 DIAGNOSIS — A419 Sepsis, unspecified organism: Secondary | ICD-10-CM | POA: Diagnosis present

## 2017-10-07 DIAGNOSIS — E119 Type 2 diabetes mellitus without complications: Secondary | ICD-10-CM

## 2017-10-07 DIAGNOSIS — F17200 Nicotine dependence, unspecified, uncomplicated: Secondary | ICD-10-CM | POA: Diagnosis present

## 2017-10-07 DIAGNOSIS — N390 Urinary tract infection, site not specified: Secondary | ICD-10-CM | POA: Diagnosis present

## 2017-10-07 DIAGNOSIS — J690 Pneumonitis due to inhalation of food and vomit: Secondary | ICD-10-CM | POA: Diagnosis present

## 2017-10-07 DIAGNOSIS — E860 Dehydration: Secondary | ICD-10-CM | POA: Diagnosis present

## 2017-10-07 DIAGNOSIS — R4182 Altered mental status, unspecified: Secondary | ICD-10-CM

## 2017-10-07 DIAGNOSIS — G894 Chronic pain syndrome: Secondary | ICD-10-CM | POA: Diagnosis present

## 2017-10-07 DIAGNOSIS — J441 Chronic obstructive pulmonary disease with (acute) exacerbation: Secondary | ICD-10-CM | POA: Diagnosis present

## 2017-10-07 DIAGNOSIS — R000 Tachycardia, unspecified: Secondary | ICD-10-CM | POA: Diagnosis present

## 2017-10-07 DIAGNOSIS — M549 Dorsalgia, unspecified: Secondary | ICD-10-CM | POA: Diagnosis present

## 2017-10-07 DIAGNOSIS — J189 Pneumonia, unspecified organism: Secondary | ICD-10-CM | POA: Diagnosis not present

## 2017-10-07 DIAGNOSIS — F1410 Cocaine abuse, uncomplicated: Secondary | ICD-10-CM | POA: Diagnosis present

## 2017-10-07 DIAGNOSIS — J9611 Chronic respiratory failure with hypoxia: Secondary | ICD-10-CM | POA: Diagnosis not present

## 2017-10-07 DIAGNOSIS — J9601 Acute respiratory failure with hypoxia: Secondary | ICD-10-CM | POA: Diagnosis not present

## 2017-10-07 DIAGNOSIS — Z79899 Other long term (current) drug therapy: Secondary | ICD-10-CM

## 2017-10-07 LAB — ECHOCARDIOGRAM COMPLETE
AO mean calculated velocity dopler: 111 cm/s
AOPV: 0.83 m/s
AOVTI: 32.7 cm
AV Area VTI index: 1.2 cm2/m2
AV Area VTI: 2.6 cm2
AV Area mean vel: 2.57 cm2
AV Mean grad: 6 mmHg
AV area mean vel ind: 1.18 cm2/m2
AV peak Index: 1.19
AV vel: 2.62
AVA: 2.62 cm2
AVCELMEANRAT: 0.82
AVLVOTPG: 8 mmHg
AVPG: 12 mmHg
AVPKVEL: 174 cm/s
CHL CUP RV SYS PRESS: 40 mmHg
CHL CUP STROKE VOLUME: 58 mL
E decel time: 225 msec
E/e' ratio: 9.12
FS: 38 % (ref 28–44)
IVS/LV PW RATIO, ED: 0.84
LA ID, A-P, ES: 36 mm
LA diam end sys: 36 mm
LA vol A4C: 50.7 ml
LA vol: 46.5 mL
LADIAMINDEX: 1.65 cm/m2
LAVOLIN: 21.3 mL/m2
LDCA: 3.14 cm2
LV E/e' medial: 9.12
LV PW d: 11.1 mm — AB (ref 0.6–1.1)
LV TDI E'LATERAL: 10.8
LV TDI E'MEDIAL: 7.72
LV e' LATERAL: 10.8 cm/s
LVDIAVOL: 87 mL (ref 46–106)
LVDIAVOLIN: 40 mL/m2
LVEEAVG: 9.12
LVOT VTI: 27.3 cm
LVOT diameter: 20 mm
LVOT peak VTI: 0.83 cm
LVOT peak vel: 144 cm/s
LVOTSV: 86 mL
LVSYSVOL: 29 mL (ref 14–42)
LVSYSVOLIN: 13 mL/m2
MV Dec: 225
MV pk A vel: 99 m/s
MVPG: 4 mmHg
MVPKEVEL: 98.5 m/s
RV LATERAL S' VELOCITY: 14 cm/s
RV TAPSE: 23.9 mm
Reg peak vel: 281 cm/s
Simpson's disk: 67
TR max vel: 281 cm/s
Valve area index: 1.2
Weight: 3360 oz

## 2017-10-07 LAB — BLOOD GAS, ARTERIAL
ACID-BASE DEFICIT: 1.6 mmol/L (ref 0.0–2.0)
ACID-BASE DEFICIT: 2 mmol/L (ref 0.0–2.0)
Acid-Base Excess: 2.9 mmol/L — ABNORMAL HIGH (ref 0.0–2.0)
BICARBONATE: 21 mmol/L (ref 20.0–28.0)
Bicarbonate: 22 mmol/L (ref 20.0–28.0)
Bicarbonate: 21.5 mmol/L (ref 20.0–28.0)
DRAWN BY: 22223
Delivery systems: POSITIVE
Delivery systems: POSITIVE
Drawn by: 221791
Drawn by: 221791
EXPIRATORY PAP: 5
FIO2: 45
FIO2: 55
INSPIRATORY PAP: 18
O2 CONTENT: 3 L/min
O2 SAT: 92.5 %
O2 SAT: 91 %
O2 Saturation: 92.1 %
PCO2 ART: 56.7 mmHg — AB (ref 32.0–48.0)
PH ART: 7.246 — AB (ref 7.350–7.450)
PO2 ART: 72 mmHg — AB (ref 83.0–108.0)
PO2 ART: 67.2 mmHg — AB (ref 83.0–108.0)
RATE: 12 resp/min
RATE: 16 resp/min
pCO2 arterial: 53.7 mmHg — ABNORMAL HIGH (ref 32.0–48.0)
pCO2 arterial: 58 mmHg — ABNORMAL HIGH (ref 32.0–48.0)
pH, Arterial: 7.258 — ABNORMAL LOW (ref 7.350–7.450)
pH, Arterial: 7.26 — ABNORMAL LOW (ref 7.350–7.450)
pO2, Arterial: 72.6 mmHg — ABNORMAL LOW (ref 83.0–108.0)

## 2017-10-07 LAB — MAGNESIUM: Magnesium: 2.4 mg/dL (ref 1.7–2.4)

## 2017-10-07 LAB — CBC WITH DIFFERENTIAL/PLATELET
BASOS ABS: 0 10*3/uL (ref 0.0–0.1)
BASOS PCT: 0 %
EOS ABS: 0 10*3/uL (ref 0.0–0.7)
Eosinophils Relative: 0 %
HEMATOCRIT: 42.8 % (ref 36.0–46.0)
HEMOGLOBIN: 14.6 g/dL (ref 12.0–15.0)
Lymphocytes Relative: 10 %
Lymphs Abs: 1.1 10*3/uL (ref 0.7–4.0)
MCH: 32 pg (ref 26.0–34.0)
MCHC: 34.1 g/dL (ref 30.0–36.0)
MCV: 93.9 fL (ref 78.0–100.0)
Monocytes Absolute: 1 10*3/uL (ref 0.1–1.0)
Monocytes Relative: 9 %
NEUTROS ABS: 9.1 10*3/uL — AB (ref 1.7–7.7)
NEUTROS PCT: 81 %
Platelets: 214 10*3/uL (ref 150–400)
RBC: 4.56 MIL/uL (ref 3.87–5.11)
RDW: 13.1 % (ref 11.5–15.5)
WBC: 11.3 10*3/uL — AB (ref 4.0–10.5)

## 2017-10-07 LAB — URINALYSIS, ROUTINE W REFLEX MICROSCOPIC
Bilirubin Urine: NEGATIVE
Glucose, UA: NEGATIVE mg/dL
KETONES UR: NEGATIVE mg/dL
NITRITE: NEGATIVE
PROTEIN: 30 mg/dL — AB
Specific Gravity, Urine: 1.019 (ref 1.005–1.030)
pH: 5 (ref 5.0–8.0)

## 2017-10-07 LAB — GLUCOSE, CAPILLARY
GLUCOSE-CAPILLARY: 229 mg/dL — AB (ref 65–99)
Glucose-Capillary: 261 mg/dL — ABNORMAL HIGH (ref 65–99)
Glucose-Capillary: 254 mg/dL — ABNORMAL HIGH (ref 65–99)
Glucose-Capillary: 292 mg/dL — ABNORMAL HIGH (ref 65–99)

## 2017-10-07 LAB — BASIC METABOLIC PANEL
Anion gap: 7 (ref 5–15)
BUN: 33 mg/dL — ABNORMAL HIGH (ref 6–20)
CALCIUM: 8.4 mg/dL — AB (ref 8.9–10.3)
CO2: 25 mmol/L (ref 22–32)
CREATININE: 1.03 mg/dL — AB (ref 0.44–1.00)
Chloride: 105 mmol/L (ref 101–111)
GFR, EST NON AFRICAN AMERICAN: 56 mL/min — AB (ref >60)
Glucose, Bld: 283 mg/dL — ABNORMAL HIGH (ref 65–99)
Potassium: 4 mmol/L (ref 3.5–5.1)
Sodium: 137 mmol/L (ref 135–145)

## 2017-10-07 LAB — COMPREHENSIVE METABOLIC PANEL
ALBUMIN: 3.1 g/dL — AB (ref 3.5–5.0)
ALK PHOS: 75 U/L (ref 38–126)
ALT: 38 U/L (ref 14–54)
ANION GAP: 13 (ref 5–15)
AST: 73 U/L — AB (ref 15–41)
BILIRUBIN TOTAL: 0.6 mg/dL (ref 0.3–1.2)
BUN: 42 mg/dL — AB (ref 6–20)
CALCIUM: 9.1 mg/dL (ref 8.9–10.3)
CO2: 24 mmol/L (ref 22–32)
Chloride: 99 mmol/L — ABNORMAL LOW (ref 101–111)
Creatinine, Ser: 1.45 mg/dL — ABNORMAL HIGH (ref 0.44–1.00)
GFR calc Af Amer: 43 mL/min — ABNORMAL LOW (ref >60)
GFR calc non Af Amer: 37 mL/min — ABNORMAL LOW (ref >60)
GLUCOSE: 256 mg/dL — AB (ref 65–99)
Potassium: 4.2 mmol/L (ref 3.5–5.1)
SODIUM: 136 mmol/L (ref 135–145)
Total Protein: 8.5 g/dL — ABNORMAL HIGH (ref 6.5–8.1)

## 2017-10-07 LAB — TROPONIN I: Troponin I: <0.03 ng/mL (ref <0.03)

## 2017-10-07 LAB — RAPID URINE DRUG SCREEN, HOSP PERFORMED
Amphetamines: NOT DETECTED
Barbiturates: NOT DETECTED
Benzodiazepines: POSITIVE — AB
COCAINE: POSITIVE — AB
OPIATES: NOT DETECTED
Tetrahydrocannabinol: NOT DETECTED

## 2017-10-07 LAB — ETHANOL: Alcohol, Ethyl (B): <10 mg/dL (ref <10)

## 2017-10-07 LAB — PROCALCITONIN: Procalcitonin: 1.04 ng/mL

## 2017-10-07 LAB — CBG MONITORING, ED: GLUCOSE-CAPILLARY: 271 mg/dL — AB (ref 65–99)

## 2017-10-07 LAB — MRSA PCR SCREENING: MRSA by PCR: NEGATIVE

## 2017-10-07 LAB — PHOSPHORUS: Phosphorus: 3.5 mg/dL (ref 2.5–4.6)

## 2017-10-07 LAB — ACETAMINOPHEN LEVEL

## 2017-10-07 LAB — SALICYLATE LEVEL

## 2017-10-07 LAB — I-STAT CG4 LACTIC ACID, ED: Lactic Acid, Venous: 1.6 mmol/L (ref 0.5–1.9)

## 2017-10-07 IMAGING — DX DG CHEST 1V
1 series · 1 of 1 positions shown · non-contrast
Comparison: Chest radiograph dated [DATE].

CLINICAL DATA: 66-year-old female with altered mental status.

EXAM:
CHEST 1 VIEW

[chest pa]
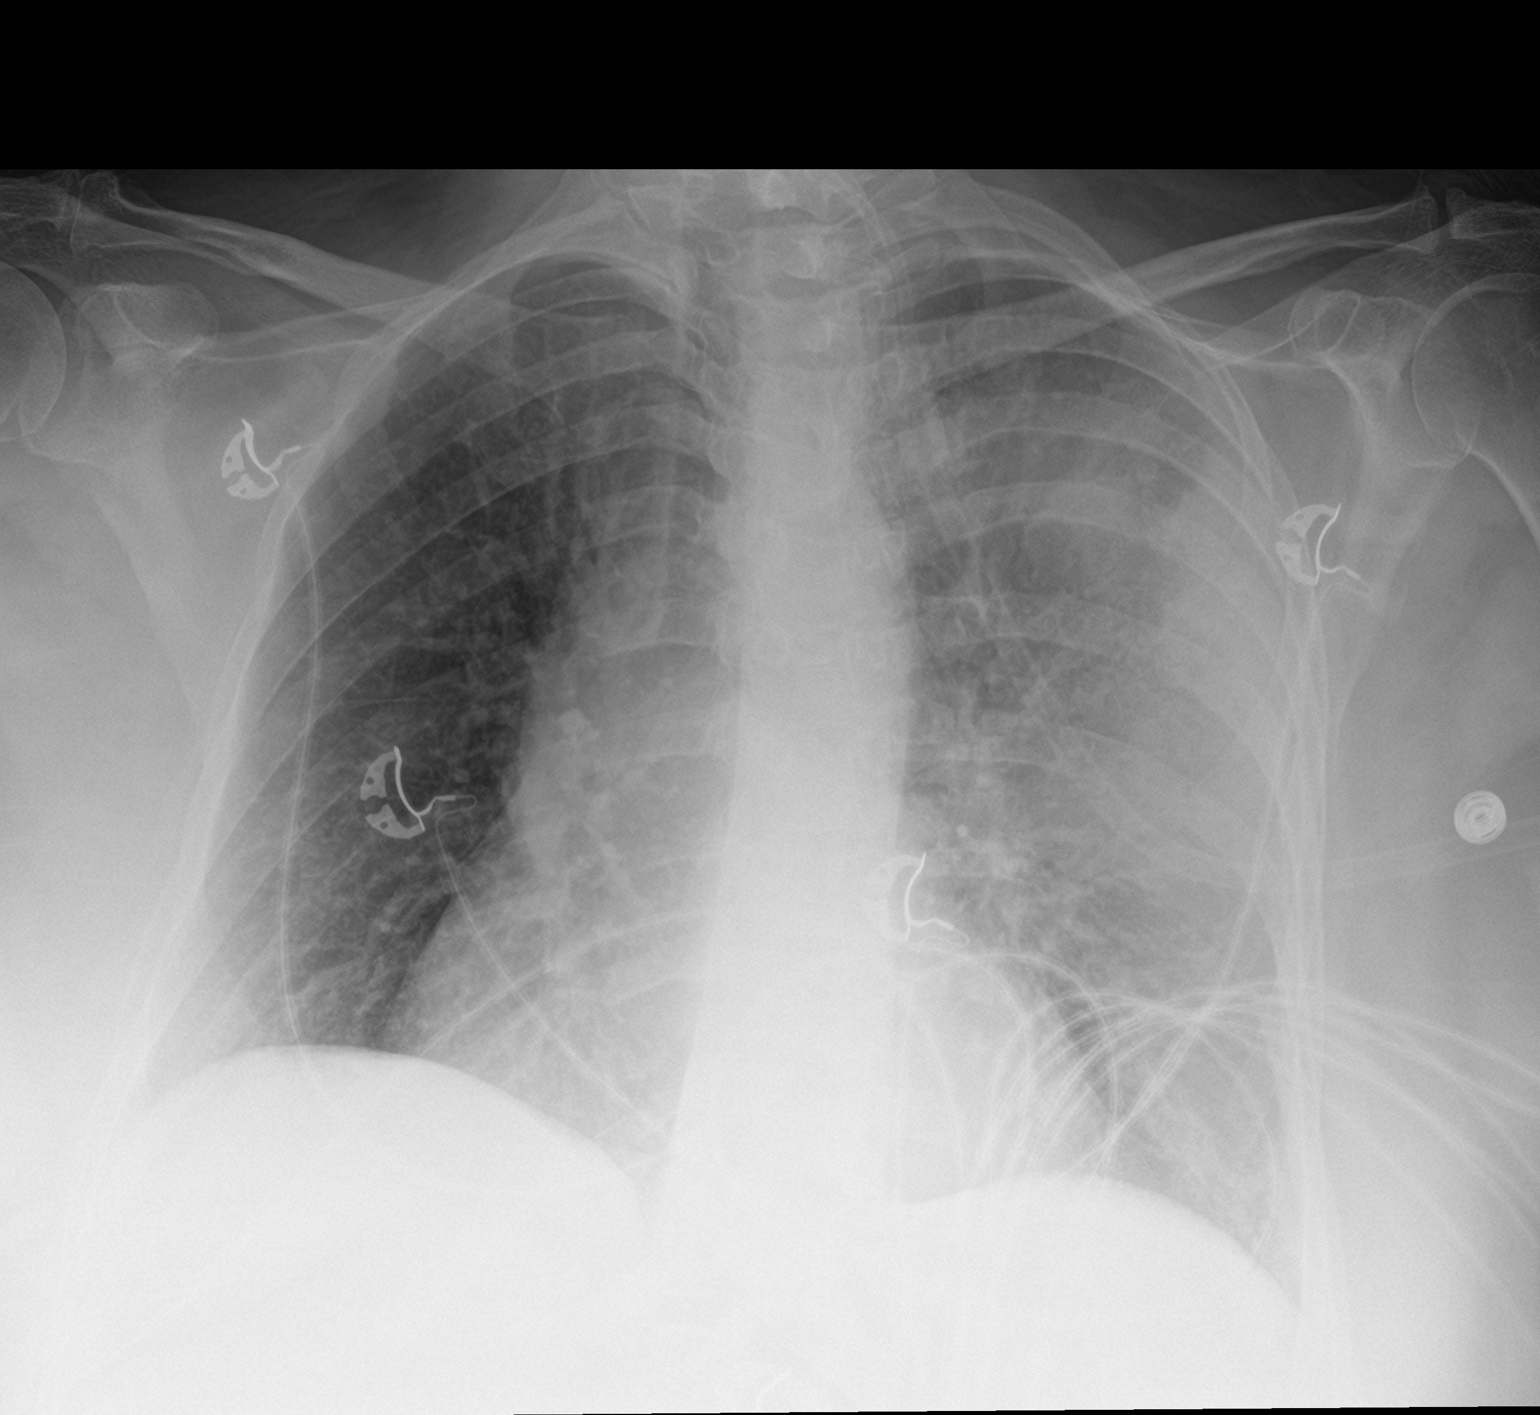

[1 of 1 positions shown; findings below may reference images not displayed]

FINDINGS: There is patchy area of opacity in the left lung primarily involving
the mid to upper lung field. This may represent an area of pulmonary
contusion, or pneumonia. Correlation with clinical exam recommended.
The right lung is clear. There is no pleural effusion or
pneumothorax. No acute osseous pathology identified.
IMPRESSION: Patchy opacity in the left upper lung field may represent infiltrate
versus pulmonary contusion. Clinical correlation is recommended.

## 2017-10-07 IMAGING — CT CT HEAD W/O CM
3 series · 15 of 47 positions shown, 18 images · non-contrast
Comparison: None.

CLINICAL DATA: Altered mental status, nonverbal. History of
hypertension, diabetes.

EXAM:
CT HEAD WITHOUT CONTRAST
TECHNIQUE: Contiguous axial images were obtained from the base of the skull
through the vertex without intravenous contrast.

[Series 2: head wo · axial · 0.38mm/px · z∈[+30,+165]mm · 9 of 33 slices shown, 12 images]
[im 3/33  brain]
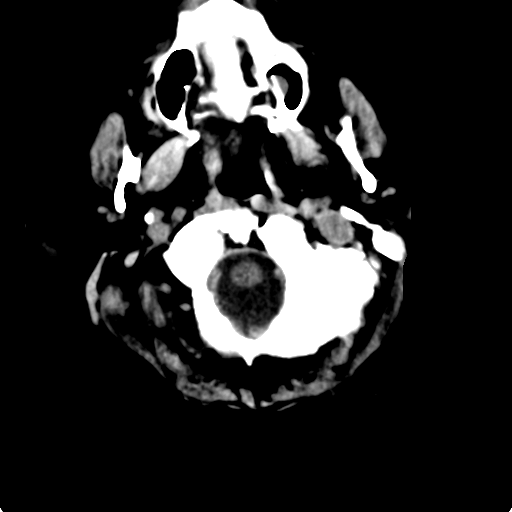
[im 3/33  bone]
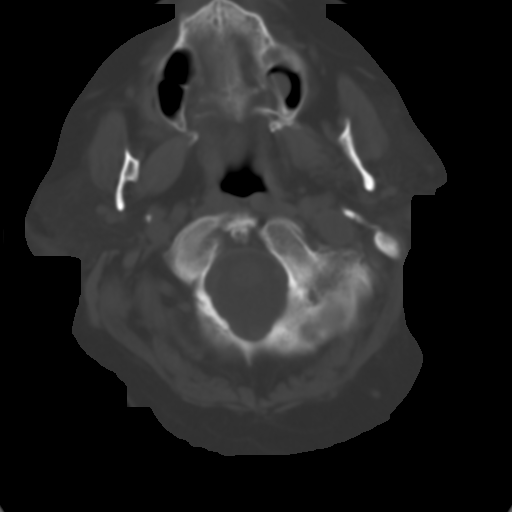
[im 6/33  brain]
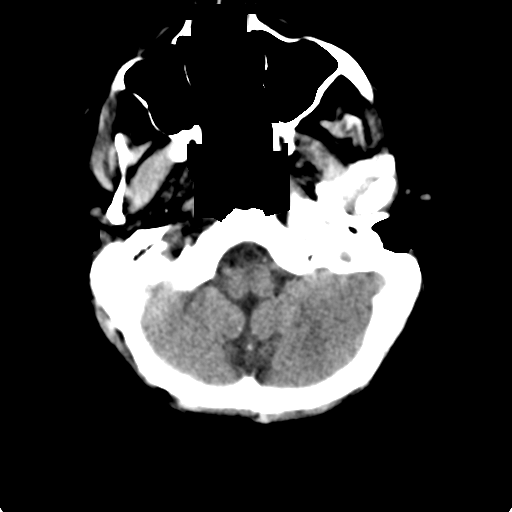
[im 9/33  brain]
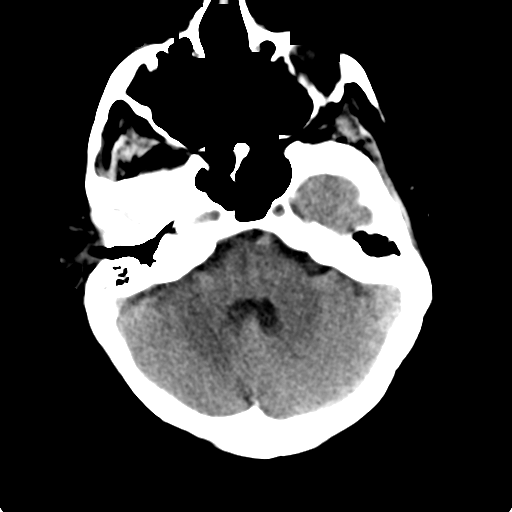
[im 13/33  brain]
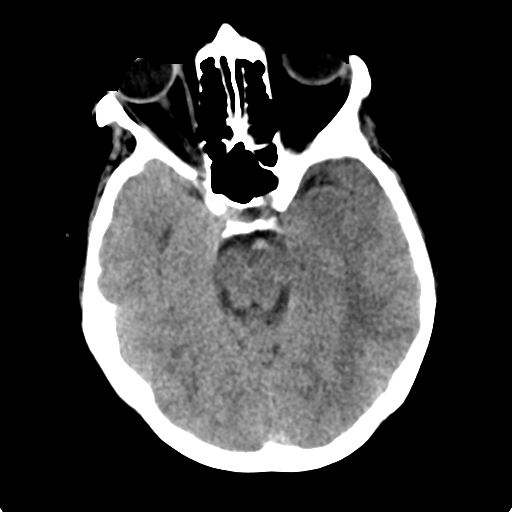
[im 17/33  brain]
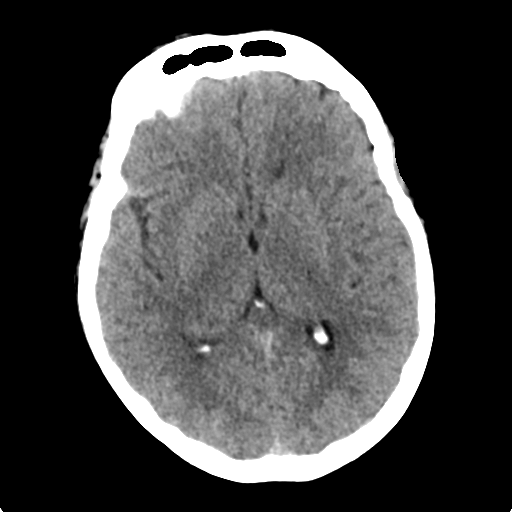
[im 17/33  bone]
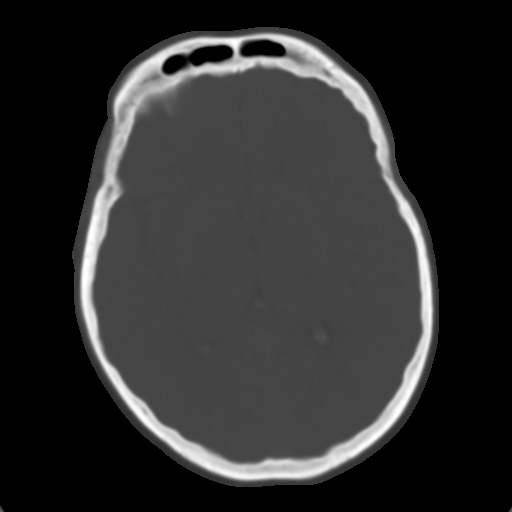
[im 20/33  brain]
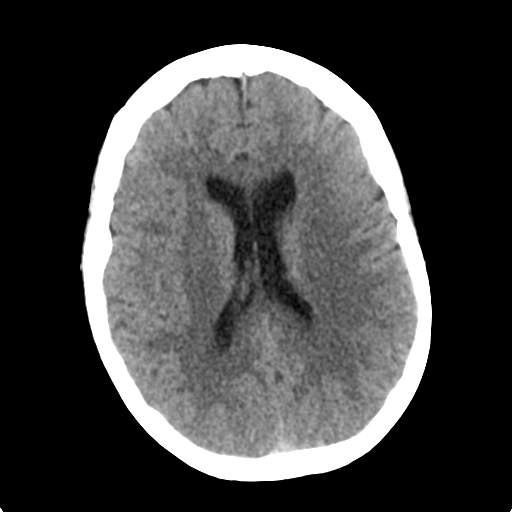
[im 24/33  brain]
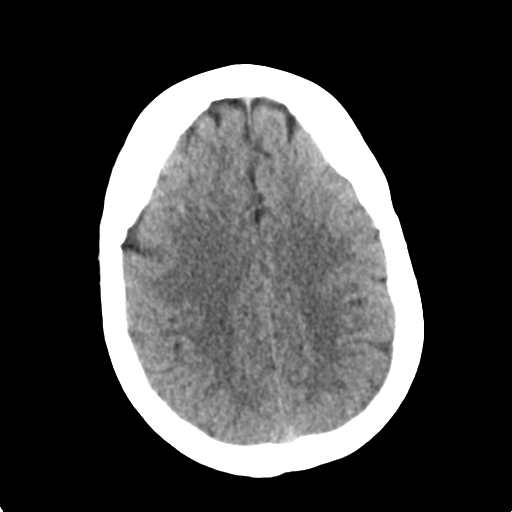
[im 27/33  brain]
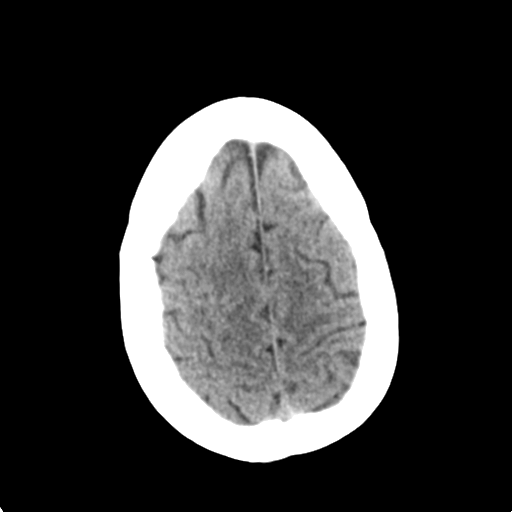
[im 30/33  brain]
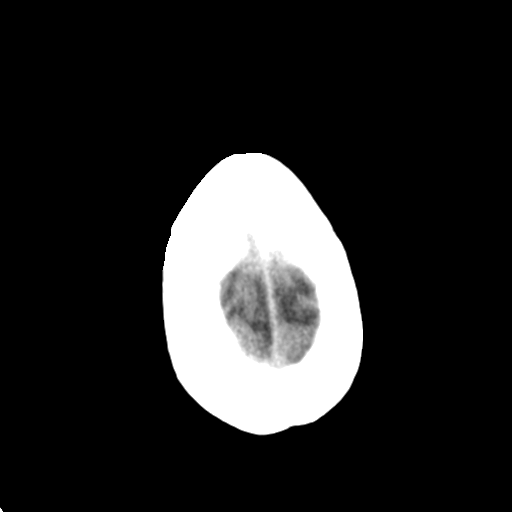
[im 30/33  bone]
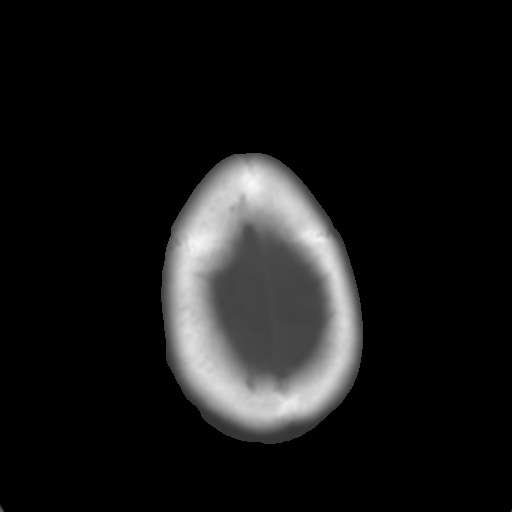

[Series 4: coronal soft tissue · coronal · 0.33mm/px · 3 of 67 slices shown]
[im 23/67  brain]
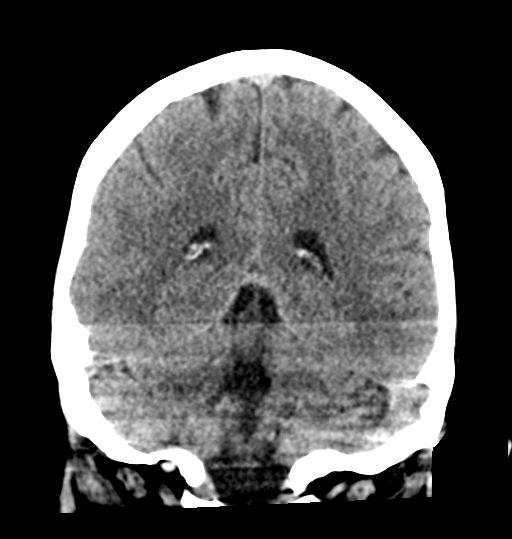
[im 30/67  brain]
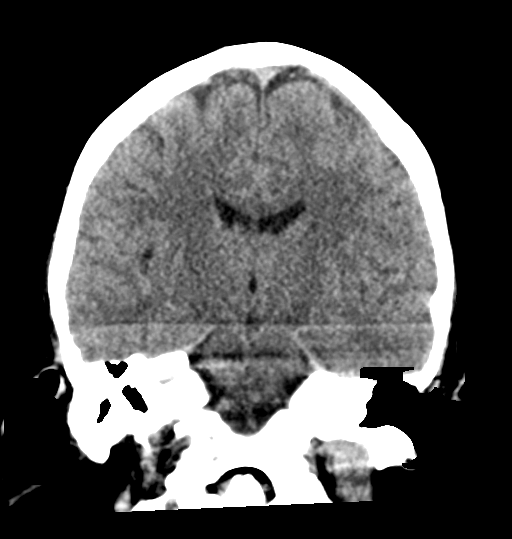
[im 37/67  brain]
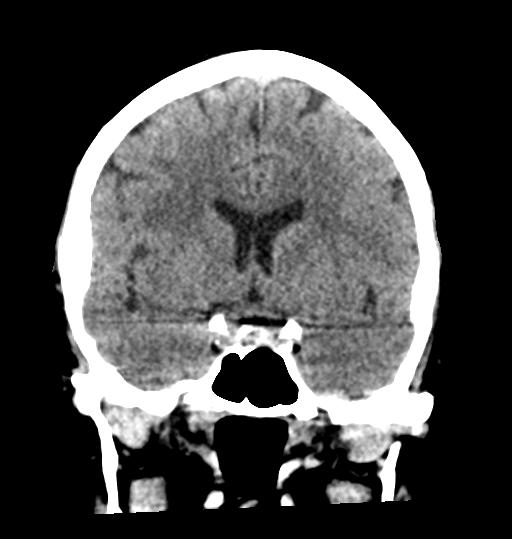

[Series 5: sagittal soft tissue · sagittal · 0.34mm/px · 3 of 51 slices shown]
[im 17/51  brain]
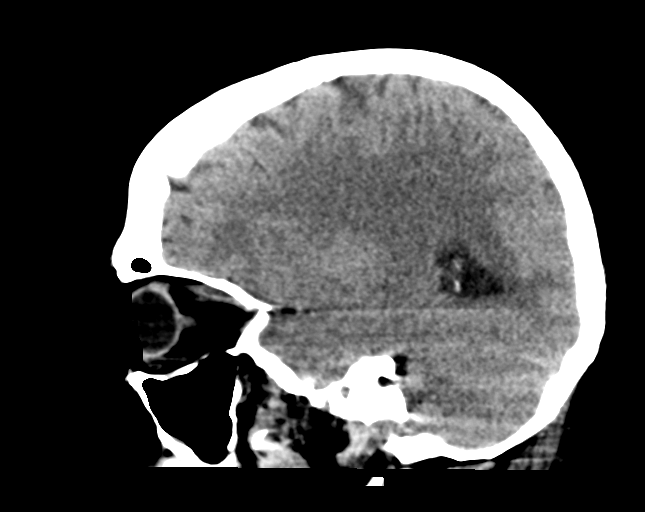
[im 26/51  brain]
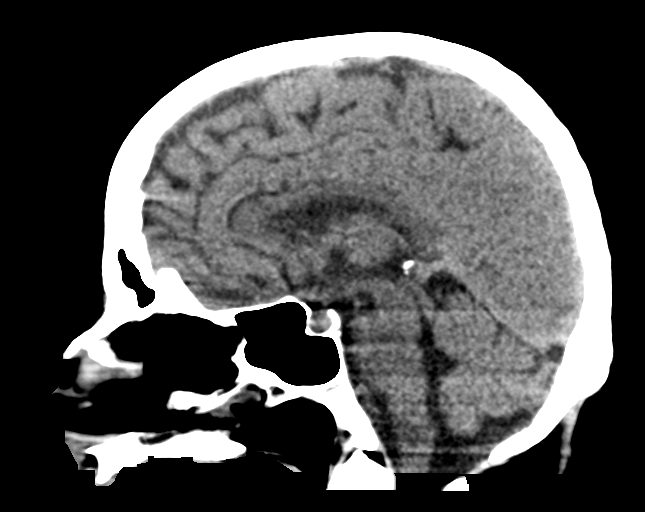
[im 34/51  brain]
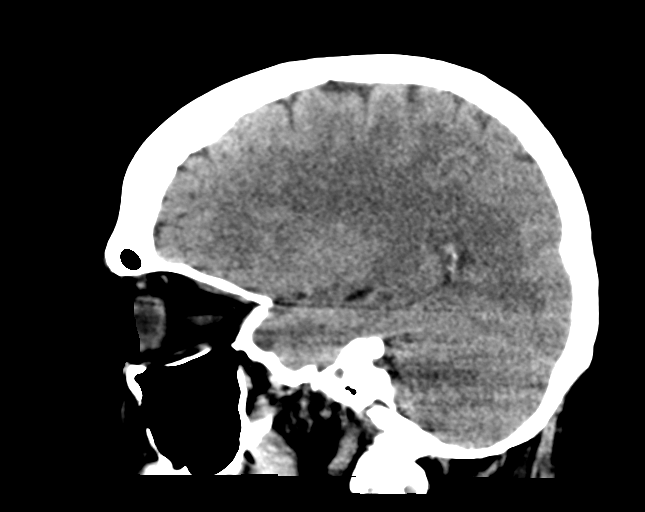

[15 of 47 positions shown; findings below may reference images not displayed]

FINDINGS: BRAIN: No intraparenchymal hemorrhage, mass effect nor midline
shift. The ventricles and sulci are normal for age. No acute large
vascular territory infarcts. No abnormal extra-axial fluid
collections. Basal cisterns are patent.

VASCULAR: Mild calcific atherosclerosis of the carotid siphons.

SKULL: No skull fracture. No significant scalp soft tissue swelling.

SINUSES/ORBITS: LEFT maxillary mucosal retention cysts without
paranasal sinus air-fluid levels. Small RIGHT mastoid effusion.
Status post LEFT wall down mastoidectomy. The included ocular globes
and orbital contents are non-suspicious.

OTHER: Patient is edentulous.
IMPRESSION: 1. Negative noncontrast CT HEAD for age.

## 2017-10-07 MED ORDER — IPRATROPIUM-ALBUTEROL 0.5-2.5 (3) MG/3ML IN SOLN: 3.0000 mL | RESPIRATORY_TRACT | Status: DC | PRN

## 2017-10-07 MED ORDER — SODIUM CHLORIDE 0.9 % IV BOLUS (SEPSIS)
1000.0000 mL | Freq: Once | INTRAVENOUS | Status: AC
  Administered 2017-10-07: 1000 mL via INTRAVENOUS

## 2017-10-07 MED ORDER — IPRATROPIUM-ALBUTEROL 0.5-2.5 (3) MG/3ML IN SOLN
3.0000 mL | RESPIRATORY_TRACT | Status: DC
  Administered 2017-10-07 – 2017-10-09 (×12): 3 mL via RESPIRATORY_TRACT
  Filled 2017-10-07 (×13): qty 3

## 2017-10-07 MED ORDER — DEXTROSE 5 % IV SOLN
2.0000 g | Freq: Once | INTRAVENOUS | Status: AC
  Administered 2017-10-07: 2 g via INTRAVENOUS
  Filled 2017-10-07: qty 2

## 2017-10-07 MED ORDER — ACETAMINOPHEN 650 MG RE SUPP
650.0000 mg | Freq: Four times a day (QID) | RECTAL | Status: DC | PRN
  Administered 2017-10-08 – 2017-10-09 (×3): 650 mg via RECTAL
  Filled 2017-10-07 (×3): qty 1

## 2017-10-07 MED ORDER — ONDANSETRON HCL 4 MG/2ML IJ SOLN: 4.0000 mg | Freq: Four times a day (QID) | INTRAMUSCULAR | Status: DC | PRN

## 2017-10-07 MED ORDER — DIPHENHYDRAMINE HCL 50 MG/ML IJ SOLN
25.0000 mg | Freq: Once | INTRAMUSCULAR | Status: AC
  Administered 2017-10-07: 25 mg via INTRAVENOUS
  Filled 2017-10-07: qty 1

## 2017-10-07 MED ORDER — NICOTINE 21 MG/24HR TD PT24
21.0000 mg | MEDICATED_PATCH | Freq: Every day | TRANSDERMAL | Status: DC | PRN
  Administered 2017-10-10: 21 mg via TRANSDERMAL
  Filled 2017-10-07: qty 1

## 2017-10-07 MED ORDER — INSULIN ASPART 100 UNIT/ML ~~LOC~~ SOLN
0.0000 [IU] | Freq: Three times a day (TID) | SUBCUTANEOUS | Status: DC
  Administered 2017-10-07: 5 [IU] via SUBCUTANEOUS
  Administered 2017-10-07: 3 [IU] via SUBCUTANEOUS
  Administered 2017-10-07: 5 [IU] via SUBCUTANEOUS
  Administered 2017-10-08: 3 [IU] via SUBCUTANEOUS
  Administered 2017-10-08: 2 [IU] via SUBCUTANEOUS
  Administered 2017-10-08: 1 [IU] via SUBCUTANEOUS
  Administered 2017-10-09 (×2): 2 [IU] via SUBCUTANEOUS
  Administered 2017-10-09: 3 [IU] via SUBCUTANEOUS
  Administered 2017-10-10: 5 [IU] via SUBCUTANEOUS
  Administered 2017-10-10 (×2): 2 [IU] via SUBCUTANEOUS
  Administered 2017-10-11 (×2): 3 [IU] via SUBCUTANEOUS
  Administered 2017-10-11: 5 [IU] via SUBCUTANEOUS
  Administered 2017-10-12: 1 [IU] via SUBCUTANEOUS

## 2017-10-07 MED ORDER — METHYLPREDNISOLONE SODIUM SUCC 40 MG IJ SOLR
40.0000 mg | Freq: Once | INTRAMUSCULAR | Status: AC
  Administered 2017-10-07: 40 mg via INTRAVENOUS
  Filled 2017-10-07: qty 1

## 2017-10-07 MED ORDER — KETOROLAC TROMETHAMINE 30 MG/ML IJ SOLN
30.0000 mg | Freq: Once | INTRAMUSCULAR | Status: AC
  Administered 2017-10-07: 30 mg via INTRAVENOUS
  Filled 2017-10-07: qty 1

## 2017-10-07 MED ORDER — ENOXAPARIN SODIUM 40 MG/0.4ML ~~LOC~~ SOLN
40.0000 mg | SUBCUTANEOUS | Status: DC
  Administered 2017-10-07 – 2017-10-12 (×6): 40 mg via SUBCUTANEOUS
  Filled 2017-10-07 (×6): qty 0.4

## 2017-10-07 MED ORDER — SODIUM CHLORIDE 0.9 % IV SOLN
INTRAVENOUS | Status: DC
  Administered 2017-10-07: 22:00:00 via INTRAVENOUS
  Administered 2017-10-07: 1 mL via INTRAVENOUS
  Administered 2017-10-08: 13:00:00 via INTRAVENOUS
  Administered 2017-10-09: 1 mL via INTRAVENOUS

## 2017-10-07 MED ORDER — MAGNESIUM SULFATE 2 GM/50ML IV SOLN
2.0000 g | Freq: Once | INTRAVENOUS | Status: AC
  Administered 2017-10-07: 2 g via INTRAVENOUS
  Filled 2017-10-07: qty 50

## 2017-10-07 MED ORDER — PIPERACILLIN-TAZOBACTAM 3.375 G IVPB
3.3750 g | Freq: Once | INTRAVENOUS | Status: AC
  Administered 2017-10-07: 3.375 g via INTRAVENOUS
  Filled 2017-10-07: qty 50

## 2017-10-07 MED ORDER — SODIUM CHLORIDE 0.9 % IV SOLN: INTRAVENOUS | Status: DC

## 2017-10-07 MED ORDER — CHLORHEXIDINE GLUCONATE 0.12 % MT SOLN
15.0000 mL | Freq: Two times a day (BID) | OROMUCOSAL | Status: DC
  Administered 2017-10-07 – 2017-10-12 (×9): 15 mL via OROMUCOSAL
  Filled 2017-10-07 (×10): qty 15

## 2017-10-07 MED ORDER — VANCOMYCIN HCL IN DEXTROSE 1-5 GM/200ML-% IV SOLN
1000.0000 mg | Freq: Once | INTRAVENOUS | Status: AC
  Administered 2017-10-07: 1000 mg via INTRAVENOUS
  Filled 2017-10-07: qty 200

## 2017-10-07 MED ORDER — ORAL CARE MOUTH RINSE
15.0000 mL | Freq: Two times a day (BID) | OROMUCOSAL | Status: DC
Start: 2017-10-07 — End: 2017-10-12
  Administered 2017-10-07 – 2017-10-10 (×5): 15 mL via OROMUCOSAL

## 2017-10-07 MED ORDER — LEVALBUTEROL HCL 1.25 MG/0.5ML IN NEBU
1.2500 mg | INHALATION_SOLUTION | Freq: Four times a day (QID) | RESPIRATORY_TRACT | Status: DC
  Administered 2017-10-07 (×2): 1.25 mg via RESPIRATORY_TRACT
  Filled 2017-10-07 (×2): qty 0.5

## 2017-10-07 MED ORDER — CEFEPIME HCL 1 G IJ SOLR
1.0000 g | INTRAMUSCULAR | Status: DC
  Administered 2017-10-08: 1 g via INTRAVENOUS
  Filled 2017-10-07 (×2): qty 1

## 2017-10-07 MED ORDER — ONDANSETRON HCL 4 MG PO TABS
4.0000 mg | ORAL_TABLET | Freq: Four times a day (QID) | ORAL | Status: DC | PRN
  Filled 2017-10-07: qty 1

## 2017-10-07 MED ORDER — METOPROLOL TARTRATE 5 MG/5ML IV SOLN
5.0000 mg | Freq: Four times a day (QID) | INTRAVENOUS | Status: DC
  Administered 2017-10-07 (×2): 5 mg via INTRAVENOUS
  Filled 2017-10-07 (×2): qty 5

## 2017-10-07 MED ORDER — IPRATROPIUM-ALBUTEROL 0.5-2.5 (3) MG/3ML IN SOLN: 3.0000 mL | Freq: Once | RESPIRATORY_TRACT | Status: DC

## 2017-10-07 MED ORDER — IPRATROPIUM BROMIDE 0.02 % IN SOLN
0.5000 mg | Freq: Four times a day (QID) | RESPIRATORY_TRACT | Status: DC
  Administered 2017-10-07 (×2): 0.5 mg via RESPIRATORY_TRACT
  Filled 2017-10-07 (×2): qty 2.5

## 2017-10-07 NOTE — Progress Notes (Signed)
Patient is off BiPAP and on 6 lpm, family keeps giving patient food and removing mask. Nurse has notified Respiratory and we will leave her off BiPAP for now . They have been warned about this.

## 2017-10-07 NOTE — H&P (Signed)
History and Physical    Tammy Blair GYJ:[id number] DOB: [birth year] DOA: 10/07/2017  PCP: [name], MD   Patient coming from: Home.  I have personally briefly reviewed patient's old medical records in [place]  Chief Complaint: Altered mental status.  HPI: Tammy Blair is a 66 y.o. female with medical history significant of anxiety, asthma, chronic back pain, lumbar radiculopathy, COPD on home oxygen, type 2 diabetes, impaired hearing, hypertension who is being brought via EMS to the emergency department due to altered mental status.  The patient's partner states that she has not been feeling well since Thursday evening. She went to bed that evening and had to be awakened by him yesterday (10/06/2017) morning. She sat in a recliner. He made her a sandwich and she ate half of it, then she went back to bed where she stayed most of the day. In the evening, he tried to wake her up and she was very difficult to arouse, so he called EMS. To his knowledge, she has not taken her medications since Thursday. She is unable to provide further history at this time.  ED Course: Initial vital signs temperature 97.7F, pulse 120, blood pressure 172/56, respirations 28 and O2 sat 98%. She received normal saline 2000 IV bolus, vancomycin and Zosyn after blood cultures were drawn. I subsequently ordered Xopenex, ipratropium and BiPAP ventilation while in the ED.  Her workup shows a urinary analysis with significant pyuria and bacteriuria. As moderate hemoglobin present. Her ABG showed a pH of 7.25 daily, PCO2 of 53.2 and PO2 of 72.6 mmHg. her O2 sat was 92.1%. WBC 11.3 with 81% neutrophils, hemoglobin 14.6 g/dL and platelets 214. Her troponin, acetaminophen, aspirin and alcohol levels are normal. Urine drug screen shows positivity for cocaine and benzodiazepines.  Imaging: A 1 view chest radiograph showed patchy opacity in the left upper lung field may represent infiltrate versus pulmonary contusion.  Her head CT did not show any acute intracranial pathology. Please see images and full radiology report for further detail   Review of Systems: Unable to review due to the patient's mental status.  Past Medical History:  Diagnosis Date  . Anxiety   . Asthma   . Chronic back pain   . COPD (chronic obstructive pulmonary disease) ([place])    on home O2  . Diabetes mellitus without complication ([place])   . HOH (hard of hearing)   . Hypertension   . Lumbar radiculopathy     Past Surgical History:  Procedure Laterality Date  . CESAREAN SECTION    . TONSILLECTOMY       reports that she has been smoking Cigarettes.  She has a 20.00 pack-year smoking history. She has never used smokeless tobacco. She reports that she does not drink alcohol or use drugs.  No Known Allergies  Family History  Problem Relation Age of Onset  . COPD Mother   . Cancer Mother        kidney  . Stroke Brother   . Asthma Sister     Prior to Admission medications   Medication Sig Start Date End Date Taking? Authorizing Provider  albuterol (PROVENTIL HFA;VENTOLIN HFA) 108 (90 Base) MCG/ACT inhaler Inhale 1-2 puffs into the lungs every 4 (four) hours as needed for wheezing or shortness of breath. 03/14/16   Mesner, Corene Cornea, MD  albuterol (PROVENTIL) (2.5 MG/3ML) 0.083% nebulizer solution Take 3 mLs (2.5 mg total) by nebulization every 4 (four) hours as needed. For shortness of breath 03/14/16  Mesner, Corene Cornea, MD  ALPRAZolam Duanne Moron) 0.5 MG tablet Take 0.5 mg by mouth 3 (three) times daily as needed for anxiety. For anxiety    [provider]  budesonide-formoterol (SYMBICORT) 160-4.5 MCG/ACT inhaler Inhale 1 puff into the lungs 2 (two) times daily.     [provider]  cephALEXin (KEFLEX) 500 MG capsule Take 1 capsule (500 mg total) by mouth 4 (four) times daily. 08/20/17   Noemi Chapel, MD  cyclobenzaprine (FLEXERIL) 10 MG tablet Take 10 mg by mouth 3 (three) times daily as needed for muscle spasms. For  muscle spasm    [provider]  gabapentin (NEURONTIN) 300 MG capsule Take 300 mg by mouth 3 (three) times daily.    [provider]  metFORMIN (GLUCOPHAGE-XR) 500 MG 24 hr tablet Take 500 mg by mouth every evening. With supper.    [provider]  metoprolol (LOPRESSOR) 100 MG tablet Take 100 mg by mouth 2 (two) times daily.    [provider]  naproxen (NAPROSYN) 500 MG tablet Take 1 tablet (500 mg total) by mouth 2 (two) times daily with a meal. 08/20/17   Noemi Chapel, MD  oxyCODONE (ROXICODONE) 15 MG immediate release tablet Take 15 mg by mouth every 6 (six) hours as needed for pain.    [provider]  ranitidine (ZANTAC) 150 MG tablet Take 150 mg by mouth 2 (two) times daily.    [provider]  tiotropium (SPIRIVA) 18 MCG inhalation capsule Place 18 mcg into inhaler and inhale daily.    [provider]    Physical Exam: Vitals:   10/07/17 0450 10/07/17 0500 10/07/17 0605 10/07/17 0610  BP: (!) 153/49 (!) 119/102  (!) 138/45  Pulse: (!) 119 (!) 116  (!) 115  Resp: (!) 30 (!) 25  (!) 22  Temp: 99.1 F (37.3 C)   (!) 102.4 F (39.1 C)  TempSrc: Oral   Axillary  SpO2: 91% (!) 89% 98% 98%  Weight:        Constitutional: Obtunded, febrile. Eyes: PERRL, lids and conjunctivae normal ENMT: Mucous membranes and lips are dry. Posterior pharynx clear of any exudate or lesions. Neck: normal, supple, no masses, no thyromegaly Respiratory: Tachypneic in the mid 20s. Decreased breath sounds on bases, bilateral wheezing and rhonchi. No accessory muscle use. Cardiovascular: Irregular, tachycardic in the 110s to 120s, no murmurs / rubs / gallops. No extremity edema. 2+ pedal pulses. No carotid bruits.  Abdomen: Soft, no tenderness, no masses palpated. No hepatosplenomegaly. Bowel sounds positive.  Musculoskeletal: no clubbing / cyanosis. Good ROM, no contractures. Normal muscle tone.  Skin: no significant rashes, lesions, ulcers on  limited dermatological exam. Neurologic: Unable to fully evaluate due to current mental status. Psychiatric: Obtunded. Wakes up briefly then falls back to sleep.    Labs on Admission: I have personally reviewed following labs and imaging studies  CBC:  Recent Labs Lab 10/07/17 0310  WBC 11.3*  NEUTROABS 9.1*  HGB 14.6  HCT 42.8  MCV 93.9  PLT 921   Basic Metabolic Panel:  Recent Labs Lab 10/07/17 0310  NA 136  K 4.2  CL 99*  CO2 24  GLUCOSE 256*  BUN 42*  CREATININE 1.45*  CALCIUM 9.1  MG 2.4  PHOS 3.5   GFR: Estimated Creatinine Clearance: 48.1 mL/min (A) (by C-G formula based on SCr of 1.45 mg/dL (H)). Liver Function Tests:  Recent Labs Lab 10/07/17 0310  AST 73*  ALT 38  ALKPHOS 75  BILITOT 0.6  PROT 8.5*  ALBUMIN 3.1*   No results for input(s): LIPASE, AMYLASE in the last 168 hours. No results for input(s): AMMONIA in the last 168 hours. Coagulation Profile: No results for input(s): INR, PROTIME in the last 168 hours. Cardiac Enzymes:  Recent Labs Lab 10/07/17 0310  TROPONINI <0.03   BNP (last 3 results) No results for input(s): PROBNP in the last 8760 hours. HbA1C: No results for input(s): HGBA1C in the last 72 hours. CBG:  Recent Labs Lab 10/07/17 0229  GLUCAP 271*   Lipid Profile: No results for input(s): CHOL, HDL, LDLCALC, TRIG, CHOLHDL, LDLDIRECT in the last 72 hours. Thyroid Function Tests: No results for input(s): TSH, T4TOTAL, FREET4, T3FREE, THYROIDAB in the last 72 hours. Anemia Panel: No results for input(s): VITAMINB12, FOLATE, FERRITIN, TIBC, IRON, RETICCTPCT in the last 72 hours. Urine analysis:    Component Value Date/Time   COLORURINE AMBER (A) 10/07/2017 0258   APPEARANCEUR HAZY (A) 10/07/2017 0258   LABSPEC 1.019 10/07/2017 0258   PHURINE 5.0 10/07/2017 0258   GLUCOSEU NEGATIVE 10/07/2017 0258   HGBUR MODERATE (A) 10/07/2017 0258   BILIRUBINUR NEGATIVE 10/07/2017 0258   KETONESUR NEGATIVE 10/07/2017 0258    PROTEINUR 30 (A) 10/07/2017 0258   UROBILINOGEN 0.2 07/17/2014 1613   NITRITE NEGATIVE 10/07/2017 0258   LEUKOCYTESUR MODERATE (A) 10/07/2017 0258    Radiological Exams on Admission: Dg Chest 1 View  Result Date: 10/07/2017 CLINICAL DATA:  66 year old female with altered mental status. EXAM: CHEST 1 VIEW COMPARISON:  Chest radiograph dated 08/11/2016. FINDINGS: There is patchy area of opacity in the left lung primarily involving the mid to upper lung field. This may represent an area of pulmonary contusion, or pneumonia. Correlation with clinical exam recommended. The right lung is clear. There is no pleural effusion or pneumothorax. No acute osseous pathology identified. IMPRESSION: Patchy opacity in the left upper lung field may represent infiltrate versus pulmonary contusion. Clinical correlation is recommended. Electronically Signed   By: Anner Crete M.D.   On: 10/07/2017 03:16   Ct Head Wo Contrast  Result Date: 10/07/2017 CLINICAL DATA:  Altered mental status, nonverbal. History of hypertension, diabetes. EXAM: CT HEAD WITHOUT CONTRAST TECHNIQUE: Contiguous axial images were obtained from the base of the skull through the vertex without intravenous contrast. COMPARISON:  None. FINDINGS: BRAIN: No intraparenchymal hemorrhage, mass effect nor midline shift. The ventricles and sulci are normal for age. No acute large vascular territory infarcts. No abnormal extra-axial fluid collections. Basal cisterns are patent. VASCULAR: Mild calcific atherosclerosis of the carotid siphons. SKULL: No skull fracture. No significant scalp soft tissue swelling. SINUSES/ORBITS: LEFT maxillary mucosal retention cysts without paranasal sinus air-fluid levels. Small RIGHT mastoid effusion. Status post LEFT wall down mastoidectomy. The included ocular globes and orbital contents are non-suspicious. OTHER: Patient is edentulous. IMPRESSION: 1. Negative noncontrast CT HEAD for age. Electronically Signed   By:  Elon Alas M.D.   On: 10/07/2017 03:20    EKG: Independently reviewed.  Vent. rate 120 BPM PR interval * ms QRS duration 91 ms QT/QTc 323/457 ms P-R-T axes * 66 31 Sinus tachycardia Premature atrial complexes  Assessment/Plan Principal Problem:   Sepsis secondary to UTI ([place]) Admit to ICU/inpatient.  Continue supplemental oxygen. Continue IV fluids. Continue IV antibiotics. Monitor intake and output. Follow-up blood cultures and sensitivity. Follow-up urine culture and sensitivity.  Active Problems:   Chronic respiratory failure (H[facility])   COPD exacerbation ([facility]) Continue supplemental oxygen. Continue BiPAP ventilation. Continue bronchodilators (Xopenex plus ipratropium while tachycardic) Single-dose Solu-Medrol  40 mg IVP given.    Tachycardia Tachycardia and may be due to stress, but also due to involuntary metoprolol discontinuation for the past 2 days. Will use metoprolol 5 mg IVP every 6 hours. Continue sepsis and COPD exacerbation treatment.    Essential hypertension Metoprolol 5 mg IVP every 6 hours until cleared for oral intake. Monitor heart rate and blood pressure.    GAD (generalized anxiety disorder) Hold alprazolam.    Chronic pain syndrome Hold oxycodone and muscle relaxant for now.    Tobacco use disorder Nicotine replacement therapy ordered as needed. Nurse to provide tobacco cessation information.    Diabetes mellitus ([place]) Carbohydrate modified diet once alert enough to eat. Hold metformin. CBG monitoring with regular insulin sliding scale.    Cocaine abuse ([place]) Relatives stated that to their knowledge, she is not using recreational drugs. I did not discuss drug screen results were then to maintain patient's privacy.    DVT prophylaxis:  Code Status: Full code. Family Communication: Her female partner, her son and her were sister present in the AV groove. Disposition Plan: Admit for IV antibiotic therapy, IV hydration COPD  exacerbation treatment for 3-4 days. Consults called:  Admission status: Inpatient/stepdown.   Reubin Milan MD [facility] Pager [phone]  If 7PM-7AM, please contact night-coverage [email or web address] Password [email or web address]  10/07/2017, 6:45 AM

## 2017-10-07 NOTE — Progress Notes (Signed)
*  PRELIMINARY RESULTS* Echocardiogram 2D Echocardiogram has been performed.  Samuel Germany 10/07/2017, 12:06 PM

## 2017-10-07 NOTE — Progress Notes (Signed)
While in room with patient, two family members asked why the patient could not eat or drink while wearing the BiPAP. I explained the risks of aspiration to the family and why it wasn't safe to allow eating or drinking while on BiPAP. I explained to the patient and family that we could try a break from the BiPAP if she needed a sip of water, but she woul have to come off the mask. The family member then stated well ive already gave her something to drink. At this point, I explained again the risks of aspiration to the family and patient. I removed the BiPAP mask and placed the patient on 6 lpm [place]. I explained to the family I would leave her off the BiPAP long enough to take some sips of water and make sure she does ok then replace the BiPAP. When I returned to the room the family had given the patient solid foods and more to drink. At this point I discussed with Respiratory Therapy about leaving BiPAP mask off due to the family being noncompliant and intervening in the patient's care. She is saturating 94% on 6lpm [place] at this time. Her RR is 19. She is not short of breath. She is requesting something to help her sleep.

## 2017-10-07 NOTE — Progress Notes (Signed)
PROGRESS NOTE    Tammy Blair  WNI:[id number] DOB: Jan 26, 1951 DOA: 10/07/2017 PCP: [name], MD    Brief Narrative:  66 year old female who presents with altered mental status. Patient does have significant past medical history of anxiety, asthma, chronic back pain, COPD on home oxygen, type 2 diabetes mellitus and hypertension. For the last 48 hours patient has been very somnolent to the point where she was difficult to be aroused. EMS was called and patient was brought into the hospital. On initial physical examination her temperature was 102.4, heart rate 120, respiratory rate 28, blood pressure 172/56, oxygen saturation 89 %. She was obtunded, noted to have increased work of breathing and febrile, her mucous membranes were dry, her lungs had decreased breath sounds at bases with bilateral wheezing and rhonchi, heart S1-S2 present irregular, tachycardic, no gallops, rubs or murmurs, the abdomen was soft nontender, no lower extremity edema. Arterial blood gas 7.25/53.7/72.6/21.0/92% on 3 L nasal cannula. Sodium 136, potassium 4.2, chloride 99, bicarbonate 24, glucose 256, BUN 42, creatinine 1.45, anion gap 13, white count 11.3, hemoglobin 14.6, hematocrit 42.8, platelets 214, venous lactic acid 1.6. Urinalysis with too numerous to count white cells. Urine drug screen positive for benzodiazepines and cocaine. Alcohol level less than 10. Head CT negative for acute findings. Chest x-ray with right rotation, opacity, well-defined borders at the lateral aspect of the left upper lobe, no air bronchogram. EKG sinus rhythm, 120 bpm, premature atrial complexes, poor R-wave progression.   Patient was admitted to the hospital with working diagnosis of sepsis due to urinary tract infection, complicated by metabolic encephalopathy respiratory failure.    Assessment & Plan:   Principal Problem:   Sepsis secondary to UTI [facility]) Active Problems:   Essential hypertension   COPD exacerbation (HCC)   GAD  (generalized anxiety disorder)   Chronic pain syndrome   Tobacco use disorder   Chronic respiratory failure (HCC)   Diabetes mellitus (HCC)   Tachycardia   Cocaine abuse ([place])   1. Sepsis due to urinary tract infection, with metabolic encephalopathy present on admission. Will continue broad spectrum antibiotic therapy with cefepime, will continue to follow on cultures, cell count and temperature curve. Will decrease rate of IV fluids to prevent volume overload, will follow a restrictive IV fluid strategy. Will follow on echocardiogram. Blood pressure at low 100s with HR in the 80s.   2. Hypoxic and hypercapnic respiratory failure, acute on chronic. Likely related to sepsis, chest film with opacity on the left, possible atelectasis or contusion due to trauma, note that patient sustained a fall at home before coming to the ED. Will continue with Bipap for now, will incrase delta pressure to 18/5, will target pH above 7.20. Continue bronchodilator therapy.   3. Acute kidney injury. Will continue IV fluids with isotonic crystalloid solutions, will decrease rate to 75 ml/hr, will continue to follow on renal panel in am, avoid hypotension or nephrotoxic medications.   4. Metabolic encephalopathy. Likely multifactorial, will continue neuro checks per unit protocol, supportive ventilation with Bipap, and aspiration precautions. Noted UDS positive for cocaine and benzodiazepines. If worsening mentation, may need a advance airway. This am with Glasgow Coma Scale of 7.   4. COPD. Stable with no significant wheezing, not clear exacerbation, will continue oxymetry monitoring, Bipap, but will continue to hold on steroids for now.   5. Hypertension. Will continue blood pressure monitoring.   6. Type 2 diabetes mellitus. Continue capillary glucose monitoring with insulin sliding scale.   Patient critically  will, will continue supportive therapy including non invasive mechanical ventilation to prevent imminent  deterioration.   Critical care time 60 minutes.   DVT prophylaxis: enoxaparin  Code Status: full Family Communication: I spoke with patient proxy of care at the bedside and all questions were addressed Disposition Plan: SNF   Consultants:     Procedures:     Antimicrobials:   cefepime    Subjective: Patient admitted to the ICU, continue to be unresponsive, has been tolerating full face mask bipap, no further fever, no vomiting.   Objective: Vitals:   10/07/17 0605 10/07/17 0610 10/07/17 0700 10/07/17 0753  BP:  (!) 138/45 (!) 115/47   Pulse:  (!) 115 93 92  Resp:  (!) 22 (!) 22 (!) 22  Temp:  (!) 102.4 F (39.1 C)  (!) 101.1 F (38.4 C)  TempSrc:  Axillary  Axillary  SpO2: 98% 98% 92% 93%  Weight:        Intake/Output Summary (Last 24 hours) at 10/07/17 0757 Last data filed at 10/07/17 0526  Gross per 24 hour  Intake             1250 ml  Output              450 ml  Net              800 ml   Filed Weights   10/07/17 0231  Weight: 95.3 kg (210 lb)    Examination:   General: very deconditioned and ill looking appearing Neurology: not responsive, no eye or verbal response, tries to localize pain.  E ENT: mild pallor, no icterus.  Cardiovascular: No JVD. S1-S2 present, rhythmic tachycardic, no gallops, rubs, or murmurs. Trace lower extremity edema. Pulmonary: decreased breath sounds bilaterally, with decreased air movement, no wheezing, but diffuse bilateral rhonchi and rales. Gastrointestinal. Abdomen protuberant and distended, no organomegaly, non tender, no rebound or guarding Skin. No rashes Musculoskeletal: no joint deformities     Data Reviewed: I have personally reviewed following labs and imaging studies  CBC:  Recent Labs Lab 10/07/17 0310  WBC 11.3*  NEUTROABS 9.1*  HGB 14.6  HCT 42.8  MCV 93.9  PLT 732   Basic Metabolic Panel:  Recent Labs Lab 10/07/17 0310  NA 136  K 4.2  CL 99*  CO2 24  GLUCOSE 256*  BUN 42*    CREATININE 1.45*  CALCIUM 9.1  MG 2.4  PHOS 3.5   GFR: Estimated Creatinine Clearance: 48.1 mL/min (A) (by C-G formula based on SCr of 1.45 mg/dL (H)). Liver Function Tests:  Recent Labs Lab 10/07/17 0310  AST 73*  ALT 38  ALKPHOS 75  BILITOT 0.6  PROT 8.5*  ALBUMIN 3.1*   No results for input(s): LIPASE, AMYLASE in the last 168 hours. No results for input(s): AMMONIA in the last 168 hours. Coagulation Profile: No results for input(s): INR, PROTIME in the last 168 hours. Cardiac Enzymes:  Recent Labs Lab 10/07/17 0310  TROPONINI <0.03   BNP (last 3 results) No results for input(s): PROBNP in the last 8760 hours. HbA1C: No results for input(s): HGBA1C in the last 72 hours. CBG:  Recent Labs Lab 10/07/17 0229 10/07/17 0751  GLUCAP 271* 261*   Lipid Profile: No results for input(s): CHOL, HDL, LDLCALC, TRIG, CHOLHDL, LDLDIRECT in the last 72 hours. Thyroid Function Tests: No results for input(s): TSH, T4TOTAL, FREET4, T3FREE, THYROIDAB in the last 72 hours. Anemia Panel: No results for input(s): VITAMINB12, FOLATE, FERRITIN, TIBC, IRON, RETICCTPCT in  the last 72 hours.    Radiology Studies: I have reviewed all of the imaging during this hospital visit personally     Scheduled Meds: . enoxaparin (LOVENOX) injection  40 mg Subcutaneous Q24H  . insulin aspart  0-9 Units Subcutaneous TID WC  . ipratropium  0.5 mg Nebulization Q6H  . levalbuterol  1.25 mg Nebulization Q6H  . metoprolol tartrate  5 mg Intravenous Q6H   Continuous Infusions: . sodium chloride    . [START ON 10/08/2017] ceFEPime (MAXIPIME) IV    . ceFEPime (MAXIPIME) IV    . sodium chloride 1,000 mL (10/07/17 0644)     LOS: 0 days        Mauricio Gerome Apley, MD [facility] Pager [phone]

## 2017-10-07 NOTE — Progress Notes (Signed)
Unable to adjust BiPAP due to family

## 2017-10-07 NOTE — ED Provider Notes (Signed)
[facility] EMERGENCY DEPARTMENT Provider Note   CSN: [id number] Arrival date & time: 10/07/17  3557     History   Chief Complaint Chief Complaint  Patient presents with  . Altered Mental Status    HPI Tammy Blair is a 66 y.o. female.  Patient is a 66 year old female with extensive past medical history including COPD, diabetes, hypertension, anxiety, and chronic back pain.  She is brought by EMS for evaluation of altered mental status.  She has apparently "not felt well" all day.  This evening she was difficult to rouse and EMS was called.  The patient adds little additional history secondary to mental status.   The history is provided by the patient, the EMS personnel and a relative.  Altered Mental Status   This is a new problem. The current episode started yesterday. The problem has been rapidly worsening. Associated symptoms include confusion, somnolence and unresponsiveness.    Past Medical History:  Diagnosis Date  . Anxiety   . Asthma   . Chronic back pain   . COPD (chronic obstructive pulmonary disease) ([place])    on home O2  . Diabetes mellitus without complication ([place])   . HOH (hard of hearing)   . Hypertension   . Lumbar radiculopathy     Patient Active Problem List   Diagnosis Date Noted  . Acute respiratory failure with hypercapnia ([place]) 08/11/2016  . Diabetes mellitus ([place]) 08/11/2016  . HOH (hard of hearing) 08/22/2013  . Hyperglycemia 08/21/2013  . COPD exacerbation ([place]) 02/18/2013  . GAD (generalized anxiety disorder) 02/18/2013  . Chronic pain syndrome 02/18/2013  . Tobacco use disorder 02/18/2013  . Chronic respiratory failure ([place]) 02/18/2013  . Obesity, unspecified 02/18/2013  . HYPERTENSION 04/29/2009  . COPD UNSPECIFIED 04/29/2009  . WEIGHT GAIN, ABNORMAL 04/29/2009    Past Surgical History:  Procedure Laterality Date  . CESAREAN SECTION    . TONSILLECTOMY      OB History    No data available       Home Medications     Prior to Admission medications   Medication Sig Start Date End Date Taking? Authorizing Provider  albuterol (PROVENTIL HFA;VENTOLIN HFA) 108 (90 Base) MCG/ACT inhaler Inhale 1-2 puffs into the lungs every 4 (four) hours as needed for wheezing or shortness of breath. 03/14/16   Mesner, Corene Cornea, MD  albuterol (PROVENTIL) (2.5 MG/3ML) 0.083% nebulizer solution Take 3 mLs (2.5 mg total) by nebulization every 4 (four) hours as needed. For shortness of breath 03/14/16   Mesner, Corene Cornea, MD  ALPRAZolam Duanne Moron) 0.5 MG tablet Take 0.5 mg by mouth 3 (three) times daily as needed for anxiety. For anxiety    [provider]  budesonide-formoterol (SYMBICORT) 160-4.5 MCG/ACT inhaler Inhale 1 puff into the lungs 2 (two) times daily.     [provider]  cephALEXin (KEFLEX) 500 MG capsule Take 1 capsule (500 mg total) by mouth 4 (four) times daily. 08/20/17   Noemi Chapel, MD  cyclobenzaprine (FLEXERIL) 10 MG tablet Take 10 mg by mouth 3 (three) times daily as needed for muscle spasms. For muscle spasm    [provider]  gabapentin (NEURONTIN) 300 MG capsule Take 300 mg by mouth 3 (three) times daily.    [provider]  metFORMIN (GLUCOPHAGE-XR) 500 MG 24 hr tablet Take 500 mg by mouth every evening. With supper.    [provider]  metoprolol (LOPRESSOR) 100 MG tablet Take 100 mg by mouth 2 (two) times daily.    [provider]  naproxen (NAPROSYN) 500 MG tablet Take 1 tablet (500 mg total) by mouth 2 (two) times daily with a meal. 08/20/17   Noemi Chapel, MD  oxyCODONE (ROXICODONE) 15 MG immediate release tablet Take 15 mg by mouth every 6 (six) hours as needed for pain.    [provider]  ranitidine (ZANTAC) 150 MG tablet Take 150 mg by mouth 2 (two) times daily.    [provider]  tiotropium (SPIRIVA) 18 MCG inhalation capsule Place 18 mcg into inhaler and inhale daily.    [provider]    Family History Family History  Problem  Relation Age of Onset  . COPD Mother   . Cancer Mother        kidney  . Stroke Brother   . Asthma Sister     Social History Social History  Substance Use Topics  . Smoking status: Current Every Day Smoker    Packs/day: 0.50    Years: 40.00    Types: Cigarettes  . Smokeless tobacco: Never Used  . Alcohol use No     Allergies   Patient has no known allergies.   Review of Systems Review of Systems  Unable to perform ROS: Acuity of condition  Psychiatric/Behavioral: Positive for confusion.     Physical Exam Updated Vital Signs There were no vitals taken for this visit.  Physical Exam  Constitutional: She appears well-developed and well-nourished.  HENT:  Head: Normocephalic and atraumatic.  Mucous membranes appear dry.  Eyes: Pupils are equal, round, and reactive to light.  Neck: Normal range of motion. Neck supple.  Cardiovascular: Normal rate and regular rhythm.  Exam reveals no gallop and no friction rub.   No murmur heard. Pulmonary/Chest: Effort normal and breath sounds normal. No respiratory distress. She has no wheezes.  Abdominal: Soft. Bowel sounds are normal. She exhibits no distension. There is no tenderness.  Musculoskeletal: Normal range of motion. She exhibits no edema.  Lymphadenopathy:    She has no cervical adenopathy.  Neurological:  Patient is somnolent, but arousable to loud voice and noxious stimuli.  Her speech is garbled.  She is visibly moving all 4 extremities, however neurologic exam is otherwise limited secondary to acuity of condition.  Skin: Skin is warm and dry.  Nursing note and vitals reviewed.    ED Treatments / Results  Labs (all labs ordered are listed, but only abnormal results are displayed) Labs Reviewed  CBG MONITORING, ED - Abnormal; Notable for the following:       Result Value   Glucose-Capillary 271 (*)    All other components within normal limits  CBC WITH DIFFERENTIAL/PLATELET  COMPREHENSIVE METABOLIC PANEL    ETHANOL  TROPONIN I  SALICYLATE LEVEL  ACETAMINOPHEN LEVEL  BLOOD GAS, ARTERIAL  RAPID URINE DRUG SCREEN, HOSP PERFORMED  URINALYSIS, ROUTINE W REFLEX MICROSCOPIC  I-STAT CG4 LACTIC ACID, ED    EKG  EKG Interpretation None       Radiology No results found.  Procedures Procedures (including critical care time)  Medications Ordered in ED Medications  sodium chloride 0.9 % bolus 1,000 mL (not administered)     Initial Impression / Assessment and Plan / ED Course  I have reviewed the triage vital signs and the nursing notes.  Pertinent labs & imaging results that were available during my care of the patient were reviewed by me and considered in my medical decision making (see chart for details).  Patient presents here elation of somnolence and difficulty arousing.  She  was initially afebrile, however was tachycardic.  While the urine sample was being obtained, she was found to be febrile rectally with a temp of 101.8.  Cultures of blood, urine were obtained and a chest x-ray was performed.  She has a mild white count but normal lactate.  Urinalysis does reveal a UTI.  She was treated with Vanco and Zosyn for presumed sepsis.  Care was discussed with Dr. Olevia Bowens from the hospitalist service who will evaluate and admit.  CRITICAL CARE Performed by: Veryl Speak Total critical care time: 45 minutes Critical care time was exclusive of separately billable procedures and treating other patients. Critical care was necessary to treat or prevent imminent or life-threatening deterioration. Critical care was time spent personally by me on the following activities: development of treatment plan with patient and/or surrogate as well as nursing, discussions with consultants, evaluation of patient's response to treatment, examination of patient, obtaining history from patient or surrogate, ordering and performing treatments and interventions, ordering and review of laboratory studies,  ordering and review of radiographic studies, pulse oximetry and re-evaluation of patient's condition.   Final Clinical Impressions(s) / ED Diagnoses   Final diagnoses:  None    New Prescriptions New Prescriptions   No medications on file     Veryl Speak, MD 10/07/17 [phone]

## 2017-10-07 NOTE — Progress Notes (Signed)
Dr. Cathlean Sauer notified of results of ABG done approx. 1400.   PH 7.246, PC02 58, P02 67.2, HC03 21.5, S02 91%. Settings 18/5, rate 16 and Fi02 55%

## 2017-10-07 NOTE — ED Triage Notes (Signed)
[facility] reports that the pt was unconscious. Pt would open her eyes but not respond. Pt is unable to let us know what is hurting her. Pt is non-verbal.

## 2017-10-07 NOTE — Progress Notes (Signed)
Pharmacy Antibiotic Note  Tammy Blair is a 65 y.o. female admitted on 10/07/2017 with UTI.  Pharmacy has been consulted for Cefepime dosing.  Plan: Cefepime 2gm IV initially, then 1 gm IV q24h F/U cxs and clinical progress Monitor V/S, labs  Weight: 210 lb (95.3 kg)  Temp (24hrs), Avg:100.3 F (37.9 C), Min:97.9 F (36.6 C), Max:102.4 F (39.1 C)   Recent Labs Lab 10/07/17 0310 10/07/17 0341  WBC 11.3*  --   CREATININE 1.45*  --   LATICACIDVEN  --  1.60    Estimated Creatinine Clearance: 48.1 mL/min (A) (by C-G formula based on SCr of 1.45 mg/dL (H)).    No Known Allergies  Antimicrobials this admission: Cefepime 10/27 >>   Dose adjustments this admission: N/A  Microbiology results: 10/27 BCx: pending 10/27 UCx: pending  Thank you for allowing pharmacy to be a part of this patient's care.  Isac Sarna, BS Pharm D, [place] Clinical Pharmacist Pager [phone] 10/07/2017 7:52 AM

## 2017-10-07 NOTE — Progress Notes (Signed)
Dr. Cathlean Sauer called in response to last ABGs and ordered to continue the same BIPAP settings for now.

## 2017-10-07 NOTE — Progress Notes (Signed)
Patient on cannula she is more awake still hard to understand her.

## 2017-10-08 ENCOUNTER — Inpatient Hospital Stay (HOSPITAL_COMMUNITY): Payer: Medicaid Other

## 2017-10-08 LAB — COMPREHENSIVE METABOLIC PANEL
ALBUMIN: 2.6 g/dL — AB (ref 3.5–5.0)
ALT: 35 U/L (ref 14–54)
AST: 68 U/L — AB (ref 15–41)
Alkaline Phosphatase: 65 U/L (ref 38–126)
Anion gap: 7 (ref 5–15)
BILIRUBIN TOTAL: 0.3 mg/dL (ref 0.3–1.2)
BUN: 23 mg/dL — AB (ref 6–20)
CHLORIDE: 103 mmol/L (ref 101–111)
CO2: 26 mmol/L (ref 22–32)
CREATININE: 0.78 mg/dL (ref 0.44–1.00)
Calcium: 8.6 mg/dL — ABNORMAL LOW (ref 8.9–10.3)
GFR calc Af Amer: >60 mL/min (ref >60)
GLUCOSE: 261 mg/dL — AB (ref 65–99)
Potassium: 3.6 mmol/L (ref 3.5–5.1)
Sodium: 136 mmol/L (ref 135–145)
TOTAL PROTEIN: 6.7 g/dL (ref 6.5–8.1)

## 2017-10-08 LAB — BLOOD GAS, ARTERIAL
Acid-Base Excess: 0.7 mmol/L (ref 0.0–2.0)
BICARBONATE: 24.5 mmol/L (ref 20.0–28.0)
Delivery systems: POSITIVE
EXPIRATORY PAP: 5
FIO2: 45
Inspiratory PAP: 16
O2 SAT: 96.4 %
PCO2 ART: 48.9 mmHg — AB (ref 32.0–48.0)
PH ART: 7.342 — AB (ref 7.350–7.450)
PO2 ART: 85 mmHg (ref 83.0–108.0)
RATE: 10 resp/min

## 2017-10-08 LAB — URINALYSIS, ROUTINE W REFLEX MICROSCOPIC
BACTERIA UA: NONE SEEN
BILIRUBIN URINE: NEGATIVE
GLUCOSE, UA: 50 mg/dL — AB
Ketones, ur: NEGATIVE mg/dL
Leukocytes, UA: NEGATIVE
NITRITE: NEGATIVE
PROTEIN: 30 mg/dL — AB
Specific Gravity, Urine: 1.02 (ref 1.005–1.030)
pH: 5 (ref 5.0–8.0)

## 2017-10-08 LAB — GLUCOSE, CAPILLARY
Glucose-Capillary: 146 mg/dL — ABNORMAL HIGH (ref 65–99)
Glucose-Capillary: 159 mg/dL — ABNORMAL HIGH (ref 65–99)
Glucose-Capillary: 169 mg/dL — ABNORMAL HIGH (ref 65–99)
Glucose-Capillary: 216 mg/dL — ABNORMAL HIGH (ref 65–99)

## 2017-10-08 LAB — CBC WITH DIFFERENTIAL/PLATELET
BASOS ABS: 0 10*3/uL (ref 0.0–0.1)
Basophils Relative: 0 %
Eosinophils Absolute: 0 10*3/uL (ref 0.0–0.7)
Eosinophils Relative: 0 %
HEMATOCRIT: 37.3 % (ref 36.0–46.0)
Hemoglobin: 12.1 g/dL (ref 12.0–15.0)
LYMPHS PCT: 6 %
Lymphs Abs: 0.8 10*3/uL (ref 0.7–4.0)
MCH: 30.7 pg (ref 26.0–34.0)
MCHC: 32.4 g/dL (ref 30.0–36.0)
MCV: 94.7 fL (ref 78.0–100.0)
Monocytes Absolute: 0.9 10*3/uL (ref 0.1–1.0)
Monocytes Relative: 7 %
NEUTROS ABS: 10.9 10*3/uL — AB (ref 1.7–7.7)
Neutrophils Relative %: 87 %
PLATELETS: 226 10*3/uL (ref 150–400)
RBC: 3.94 MIL/uL (ref 3.87–5.11)
RDW: 13.3 % (ref 11.5–15.5)
WBC: 12.6 10*3/uL — AB (ref 4.0–10.5)

## 2017-10-08 LAB — URINE CULTURE: Culture: NO GROWTH

## 2017-10-08 IMAGING — CT CT CHEST W/O CM
2 of 3 series · 15 of 36 positions shown, 18 images · non-contrast
Comparison: Radiographs yesterday.

CLINICAL DATA: Pneumonia, unresolved or complicated. Fever.
Possible aspiration.

EXAM:
CT CHEST WITHOUT CONTRAST
TECHNIQUE: Multidetector CT imaging of the chest was performed following the
standard protocol without IV contrast.

[Series 2: thorax · axial · 0.78mm/px · z∈[+1144,+1388]mm · 12 of 144 slices shown, 15 images]
[im 11/144  mediastinal]
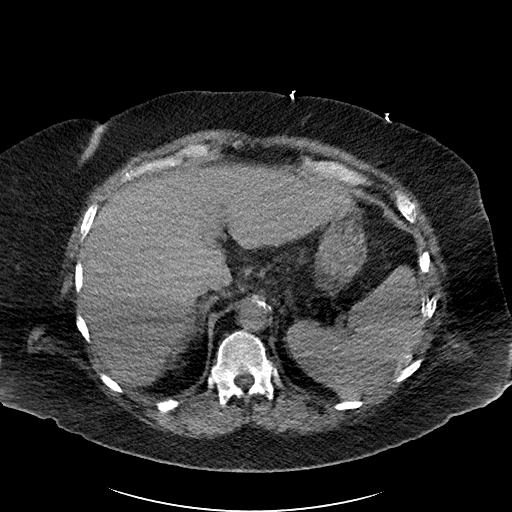
[im 11/144  lung]
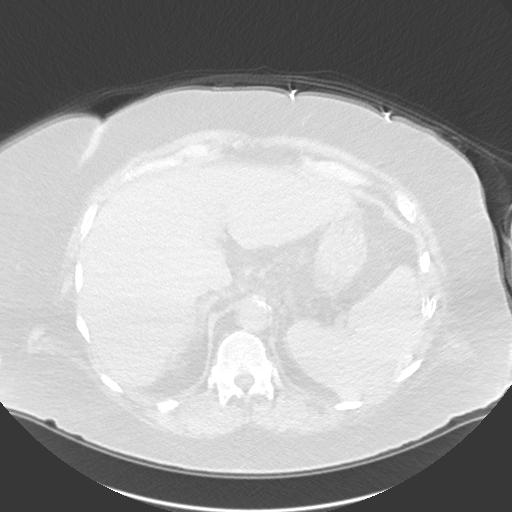
[im 22/144  lung]
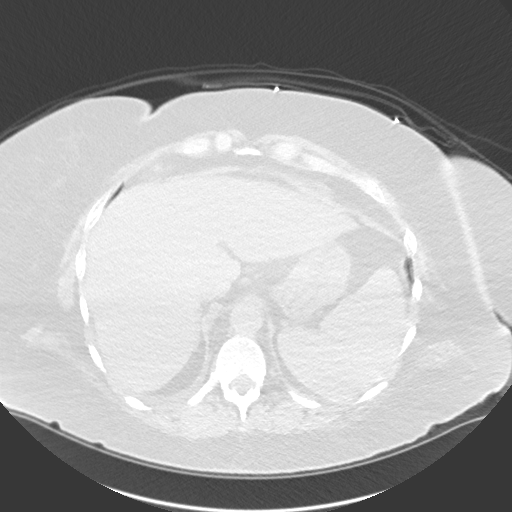
[im 32/144  lung]
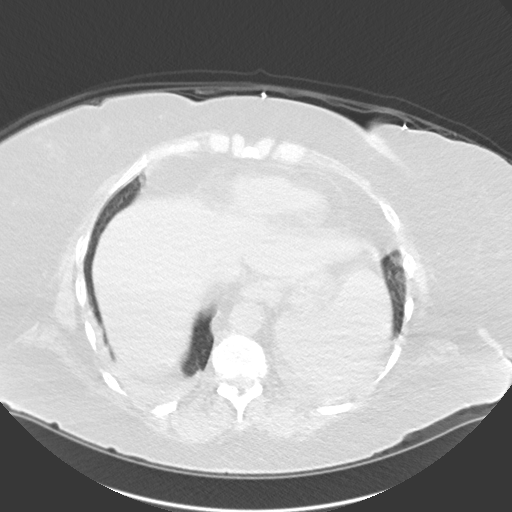
[im 43/144  lung]
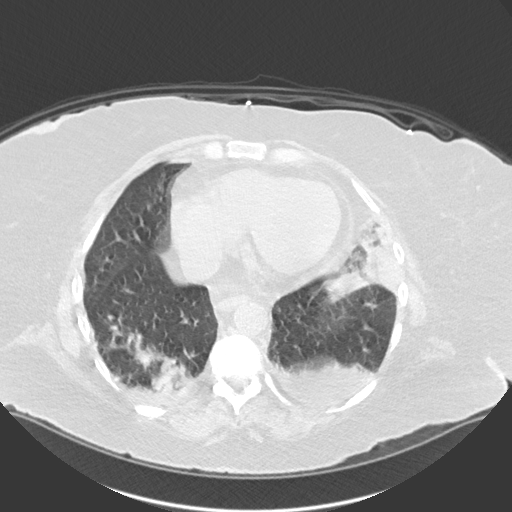
[im 53/144  mediastinal]
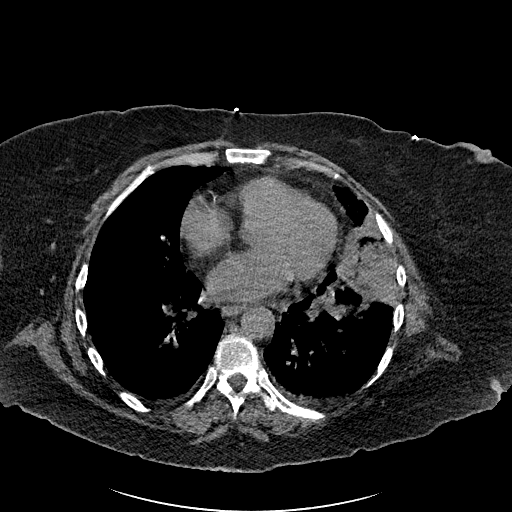
[im 53/144  lung]
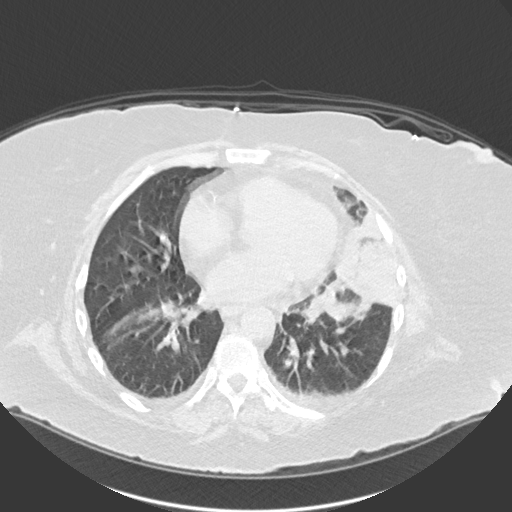
[im 64/144  lung]
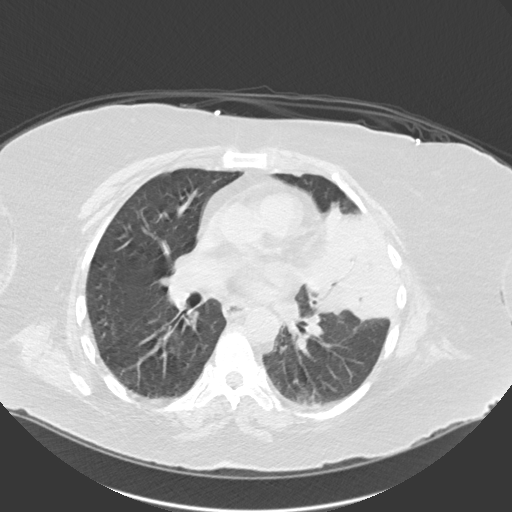
[im 80/144  lung]
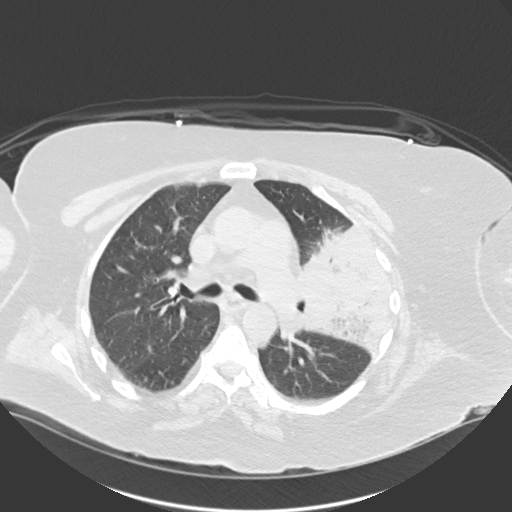
[im 91/144  lung]
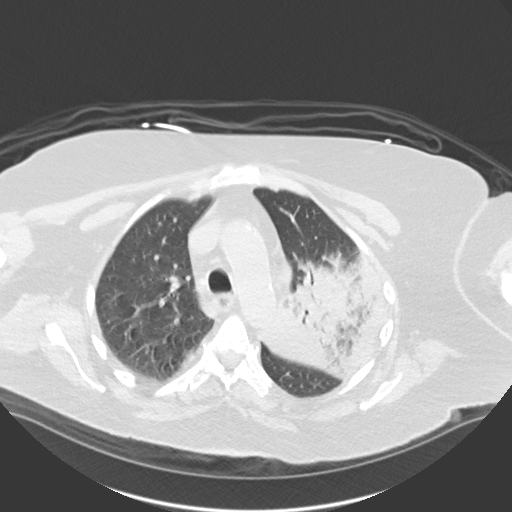
[im 101/144  mediastinal]
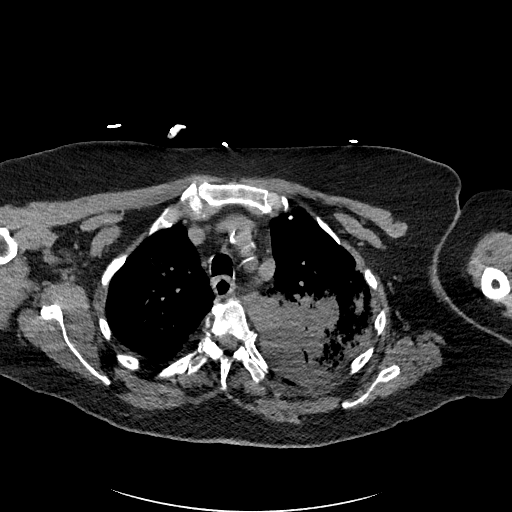
[im 101/144  lung]
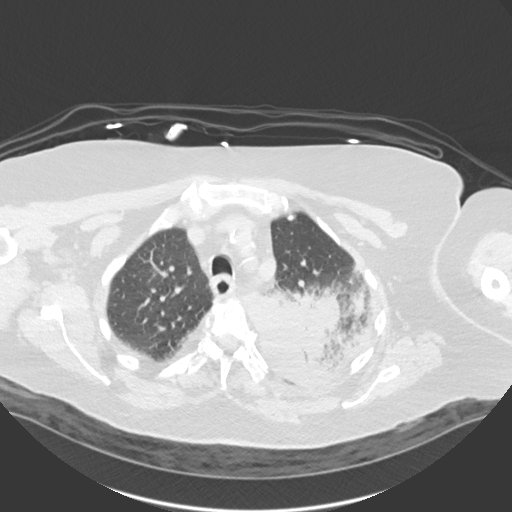
[im 112/144  lung]
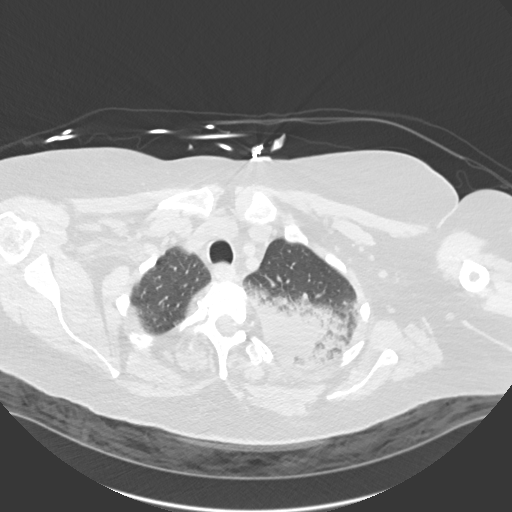
[im 122/144  lung]
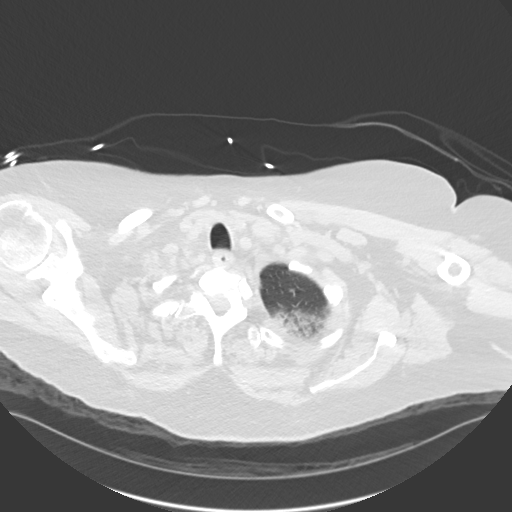
[im 133/144  lung]
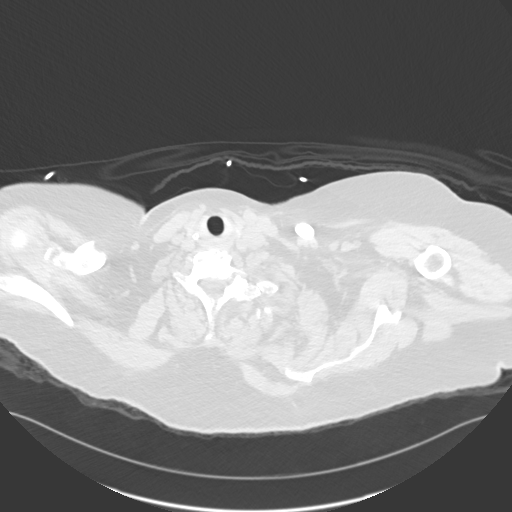

[Series 5: coronal · coronal · 0.62mm/px · 3 of 151 slices shown]
[im 31/151  lung]
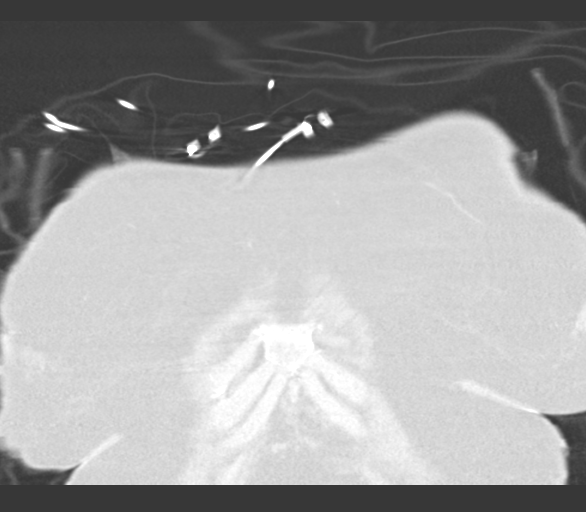
[im 61/151  lung]
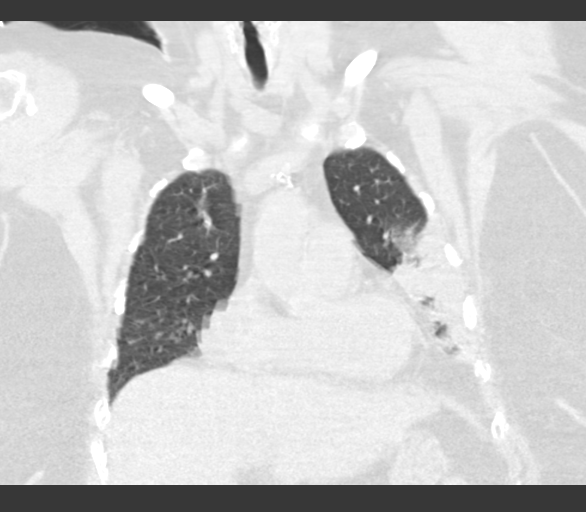
[im 91/151  lung]
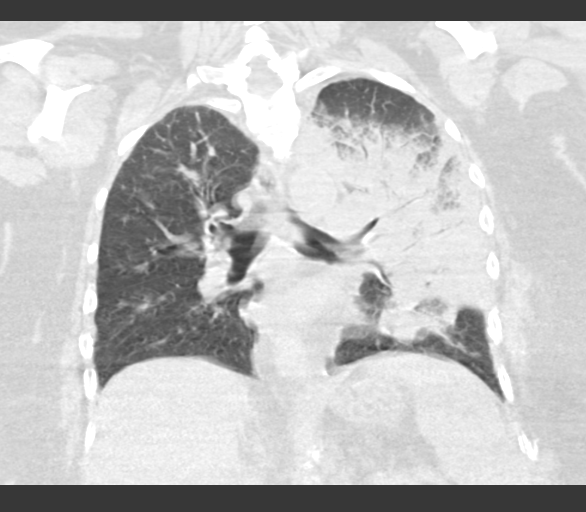

[15 of 36 positions shown; findings below may reference images not displayed]

FINDINGS: Cardiovascular: Normal caliber thoracic aorta with atherosclerosis.
Heart size upper normal. Breathing motion artifact obscures detailed
evaluation.

Mediastinum/Nodes: Enlarged AP window node measures 15 mm short
axis. Prominent prevascular and lower paratracheal nodes. Limited
assessment for hilar adenopathy given lack of IV contrast. Esophagus
is decompressed. Visualized thyroid gland is normal.

Lungs/Pleura: Breathing motion artifact partially obscures detailed
evaluation. Dense consolidation throughout the left upper lobe with
air bronchograms and surrounding ground-glass opacity. Consolidation
is rather extensive and extends from the lingula to the apex.
Bilateral dependent opacities in both lower lobes, partially
obscured by motion. Minimal patchy opacities at the right lung apex.
Possible small pleural effusions adjacent to lower lobe
consolidations. Limited assessment of the trachea and mainstem
bronchi for intraluminal debris given motion, no large filling
defect.

Upper Abdomen: Possible nodular hepatic contours.

Musculoskeletal: No evidence of acute abnormality allowing for
breathing motion.
IMPRESSION: 1. Dense left upper lobe consolidation with air bronchograms and
surrounding ground-glass opacities, suspicious for pneumonia.
Aspiration is considered, however distribution favors lobar
pneumonia. Recommend radiographic follow-up to resolution.
2. Additional dependent densities in both lung bases, more typical
distribution for aspiration, alternatively could represent
atelectasis. Possible small adjacent pleural effusions.
3. Mild mediastinal adenopathy is nonspecific but likely reactive.
4.  Aortic Atherosclerosis ([7Q]-[7Q]).
5. Possible nodular hepatic contours in the upper abdomen which can
be seen with cirrhosis. Recommend correlation for cirrhosis risk
factors.
6. Moderate motion limitations.

## 2017-10-08 MED ORDER — LORAZEPAM 2 MG/ML IJ SOLN
1.0000 mg | Freq: Once | INTRAMUSCULAR | Status: AC | PRN
  Administered 2017-10-08: 1 mg via INTRAVENOUS
  Filled 2017-10-08: qty 1

## 2017-10-08 MED ORDER — LORAZEPAM 2 MG/ML IJ SOLN
0.7500 mg | Freq: Once | INTRAMUSCULAR | Status: AC
  Administered 2017-10-08: 0.75 mg via INTRAVENOUS
  Filled 2017-10-08: qty 1

## 2017-10-08 MED ORDER — METOPROLOL TARTRATE 5 MG/5ML IV SOLN
5.0000 mg | Freq: Once | INTRAVENOUS | Status: AC | PRN
Start: 2017-10-08 — End: 2017-10-09
  Administered 2017-10-09: 5 mg via INTRAVENOUS
  Filled 2017-10-08: qty 5

## 2017-10-08 MED ORDER — HYDRALAZINE HCL 20 MG/ML IJ SOLN
10.0000 mg | INTRAMUSCULAR | Status: DC | PRN
  Administered 2017-10-09: 10 mg via INTRAVENOUS
  Filled 2017-10-08: qty 1

## 2017-10-08 MED ORDER — DEXTROSE 5 % IV SOLN
1.0000 g | Freq: Three times a day (TID) | INTRAVENOUS | Status: DC
  Filled 2017-10-08 (×4): qty 1

## 2017-10-08 MED ORDER — HALOPERIDOL 2 MG PO TABS: 1.0000 mg | ORAL_TABLET | ORAL | Status: DC | PRN

## 2017-10-08 MED ORDER — HALOPERIDOL LACTATE 5 MG/ML IJ SOLN
1.0000 mg | INTRAMUSCULAR | Status: DC | PRN
  Administered 2017-10-08 – 2017-10-09 (×3): 1 mg via INTRAMUSCULAR
  Filled 2017-10-08 (×3): qty 1

## 2017-10-08 MED ORDER — DEXTROSE 5 % IV SOLN
1.0000 g | Freq: Three times a day (TID) | INTRAVENOUS | Status: DC
  Administered 2017-10-08 – 2017-10-09 (×2): 1 g via INTRAVENOUS
  Filled 2017-10-08 (×4): qty 1

## 2017-10-08 MED ORDER — METOPROLOL TARTRATE 50 MG PO TABS
50.0000 mg | ORAL_TABLET | Freq: Two times a day (BID) | ORAL | Status: DC
  Filled 2017-10-08 (×2): qty 1

## 2017-10-08 MED ORDER — CEFTRIAXONE SODIUM 1 G IJ SOLR
1.0000 g | INTRAMUSCULAR | Status: DC
  Administered 2017-10-08: 1 g via INTRAVENOUS
  Filled 2017-10-08: qty 10

## 2017-10-08 NOTE — Progress Notes (Signed)
PROGRESS NOTE    BEATRYCE COLOMBO  AJO:[id number] DOB: [birth year] DOA: 10/07/2017 PCP: [name], MD    Brief Narrative:  66 year old female who presents with altered mental status. Patient does have significant past medical history of anxiety, asthma, chronic back pain, COPD on home oxygen, type 2 diabetes mellitus and hypertension. For the last 48 hours patient has been very somnolent to the point where she was difficult to be aroused. EMS was called and patient was brought into the hospital. On initial physical examination her temperature was 102.4, heart rate 120, respiratory rate 28, blood pressure 172/56, oxygen saturation 89 %. She was obtunded, noted to have increased work of breathing and febrile, her mucous membranes were dry, her lungs had decreased breath sounds at bases with bilateral wheezing and rhonchi, heart S1-S2 present irregular, tachycardic, no gallops, rubs or murmurs, the abdomen was soft nontender, no lower extremity edema. Arterial blood gas 7.25/53.7/72.6/21.0/92% on 3 L nasal cannula. Sodium 136, potassium 4.2, chloride 99, bicarbonate 24, glucose 256, BUN 42, creatinine 1.45, anion gap 13, white count 11.3, hemoglobin 14.6, hematocrit 42.8, platelets 214, venous lactic acid 1.6. Urinalysis with too numerous to count white cells. Urine drug screen positive for benzodiazepines and cocaine. Alcohol level less than 10. Head CT negative for acute findings. Chest x-ray with right rotation, opacity, well-defined borders at the lateral aspect of the left upper lobe, no air bronchogram. EKG sinus rhythm, 120 bpm, premature atrial complexes, poor R-wave progression.   Patient was admitted to the hospital with working diagnosis of sepsis due to urinary tract infection, complicated by metabolic encephalopathy respiratory failure.   Assessment & Plan:   Principal Problem:   Sepsis secondary to UTI [facility]) Active Problems:   Essential hypertension   COPD exacerbation (HCC)   GAD  (generalized anxiety disorder)   Chronic pain syndrome   Tobacco use disorder   Chronic respiratory failure (HCC)   Diabetes mellitus (HCC)   Tachycardia   Cocaine abuse ([place])   1. Sepsis due to urinary tract infection, with metabolic encephalopathy present on admission. Blood and urine cultures have no growth, will de-escalate antibiotic therapy to ceftriaxone. Patient has remained afebrile, wbc at 12,6. Will continue maintenance IV fluids, no signs of volume overload.  2. Hypoxic and hypercapnic respiratory failure, acute on chronic. Patient has been on full face mask bipap with good toleration, adequate tidal volumes and no significant leak. Ipap 16 and Epap 5. Follow arterial blood gas with pH up to 7,34 with pC02 at 48.9 and pa02-85. Will attempt to liberate patient to [place], with Bipap as needed during the day and continuously at night.   3. Metabolic encephalopathy with delirium. Patient more reactive but very confused, disorientated and agitated. Will continue supportive medical care, assure adequate hydration, oxygenation and pain control. Patient's family at the bedside. Will add haldol as needed for agitation. Aspiration precautions and po intake as tolerated (pureed diet)  4. Acute kidney injury. Preserved renal function with serum cr at 0.78 with K at 3,6, will continue gentle hydration with NS at 50 ml per hour, will follow on renal panel in am, no signs of volume overload.   4. COPD with exacerbation. Will continue bronchodilator therapy, will hold on systemic steroids for now to prevent hyperglycemia and possible worsening encephalopathy and agitation. Continue oxymetry monitoring.   5. Hypertension. Noted worsening hypertension will resume metoprolol for blood pressure control, will use 50% of her home dose for now, to prevent hypotension or bradycardia.   6.  Type 2 diabetes mellitus. Capillary glucose 261, 254, 229, 292, 216, 169, will continue sliding scale insulin for now  and will calculate requirements in the next 24 hours.    Patient critically will, will continue supportive therapy including non invasive mechanical ventilation and frequent neuro checks to prevent imminent deterioration.   Critical care time 60 minutes.   DVT prophylaxis: enoxaparin  Code Status: full Family Communication: I spoke with patient proxy of care at the bedside and all questions were addressed Disposition Plan: SNF   Consultants:     Procedures:     Antimicrobials:   cefepime   Subjective: Patient opens eyes to voice and follows commands, still very confused and mumbling words. Per patient's family decreased hearing.   Objective: Vitals:   10/08/17 0500 10/08/17 0600 10/08/17 0750 10/08/17 0759  BP: (!) 173/155     Pulse: (!) 105 95  94  Resp: 18 17  18   Temp:   98.3 F (36.8 C)   TempSrc:   Axillary   SpO2: 94% 100%  98%  Weight:        Intake/Output Summary (Last 24 hours) at 10/08/17 0821 Last data filed at 10/07/17 2200  Gross per 24 hour  Intake             1020 ml  Output             1600 ml  Net             -580 ml   Filed Weights   10/07/17 0231 10/08/17 0453  Weight: 95.3 kg (210 lb) 92.4 kg (203 lb 11.3 oz)    Examination:   General: very deconditioned and ill looking appearing. Neurology: responds to commands, opens eyes to voice, incomprehensive words. Moved all 4 extremities spontaneously.  E ENT: mild pallor, no icterus, oral mucosa moist Cardiovascular: No JVD. S1-S2 present, rhythmic, no gallops, rubs, or murmurs. No lower extremity edema. Pulmonary: decreased breath sounds bilaterally, decreased air movement, no wheezing, diffuse bilateral rhonchi and scattered rales. Gastrointestinal. Abdomen protuberant, no organomegaly, non tender, no rebound or guarding Skin. No rashes Musculoskeletal: no joint deformities     Data Reviewed: I have personally reviewed following labs and imaging studies  CBC:  Recent  Labs Lab 10/07/17 0310 10/08/17 0423  WBC 11.3* 12.6*  NEUTROABS 9.1* 10.9*  HGB 14.6 12.1  HCT 42.8 37.3  MCV 93.9 94.7  PLT 214 818   Basic Metabolic Panel:  Recent Labs Lab 10/07/17 0310 10/07/17 1222 10/08/17 0423  NA 136 137 136  K 4.2 4.0 3.6  CL 99* 105 103  CO2 24 25 26   GLUCOSE 256* 283* 261*  BUN 42* 33* 23*  CREATININE 1.45* 1.03* 0.78  CALCIUM 9.1 8.4* 8.6*  MG 2.4  --   --   PHOS 3.5  --   --    GFR: Estimated Creatinine Clearance: 86 mL/min (by C-G formula based on SCr of 0.78 mg/dL). Liver Function Tests:  Recent Labs Lab 10/07/17 0310 10/08/17 0423  AST 73* 68*  ALT 38 35  ALKPHOS 75 65  BILITOT 0.6 0.3  PROT 8.5* 6.7  ALBUMIN 3.1* 2.6*   No results for input(s): LIPASE, AMYLASE in the last 168 hours. No results for input(s): AMMONIA in the last 168 hours. Coagulation Profile: No results for input(s): INR, PROTIME in the last 168 hours. Cardiac Enzymes:  Recent Labs Lab 10/07/17 0310  TROPONINI <0.03   BNP (last 3 results) No results for input(s): PROBNP in  the last 8760 hours. HbA1C: No results for input(s): HGBA1C in the last 72 hours. CBG:  Recent Labs Lab 10/07/17 0751 10/07/17 1139 10/07/17 1634 10/07/17 2026 10/08/17 0749  GLUCAP 261* 254* 229* 292* 216*   Lipid Profile: No results for input(s): CHOL, HDL, LDLCALC, TRIG, CHOLHDL, LDLDIRECT in the last 72 hours. Thyroid Function Tests: No results for input(s): TSH, T4TOTAL, FREET4, T3FREE, THYROIDAB in the last 72 hours. Anemia Panel: No results for input(s): VITAMINB12, FOLATE, FERRITIN, TIBC, IRON, RETICCTPCT in the last 72 hours.    Radiology Studies: I have reviewed all of the imaging during this hospital visit personally     Scheduled Meds: . chlorhexidine  15 mL Mouth Rinse BID  . enoxaparin (LOVENOX) injection  40 mg Subcutaneous Q24H  . insulin aspart  0-9 Units Subcutaneous TID WC  . ipratropium-albuterol  3 mL Nebulization Q4H  . mouth rinse  15  mL Mouth Rinse q12n4p   Continuous Infusions: . sodium chloride 75 mL/hr at 10/07/17 2137  . ceFEPime (MAXIPIME) IV       LOS: 1 day        Tawni Millers, MD [facility] Pager [phone]

## 2017-10-08 NOTE — Progress Notes (Signed)
Dr. Cathlean Sauer notified of temperature of 102.1 rectally after treatment with tylenol

## 2017-10-08 NOTE — Progress Notes (Signed)
Dr. Cathlean Sauer notified of temperature of 103.9 rectally.

## 2017-10-08 NOTE — Progress Notes (Signed)
Pharmacy Antibiotic Note  Tammy Blair is a 66 y.o. female admitted on 10/07/2017 with sepsis/ UTI.  Pharmacy has been consulted for Cefepime dosing. CrCl improved.  Plan: Increase Cefepime 1 gm IV q8h F/U cxs and clinical progress Monitor V/S, labs  Weight: 203 lb 11.3 oz (92.4 kg)  Temp (24hrs), Avg:97.9 F (36.6 C), Min:97.6 F (36.4 C), Max:98.3 F (36.8 C)   Recent Labs Lab 10/07/17 0310 10/07/17 0341 10/07/17 1222 10/08/17 0423  WBC 11.3*  --   --  12.6*  CREATININE 1.45*  --  1.03* 0.78  LATICACIDVEN  --  1.60  --   --     Estimated Creatinine Clearance: 86 mL/min (by C-G formula based on SCr of 0.78 mg/dL).    No Known Allergies  Antimicrobials this admission: Cefepime 10/27 >>   Dose adjustments this admission: 10/28  Microbiology results: 10/27 BCx: ngtd 10/27 UCx: no growth 10/27 MRSA PCR is neg  Thank you for allowing pharmacy to be a part of this patient's care.  Isac Sarna, BS Pharm D, [place] Clinical Pharmacist Pager [phone] 10/08/2017 10:35 AM

## 2017-10-08 NOTE — Progress Notes (Signed)
Cross coverage note: Paged by RN to reports patient spiking fever.  I have reviewed chart, patient seems improved on cefepime for uti and pneumonia so abx changed to rocephin today.  However, patient started to spike fever again. I ordered repeat ua, blood culture and check chest ct. Patient could have aspirated due to confusion and delirium that is documented. abx changed back to cefepime.   Case discussed with attending  Dr Herbert Moors who agrees with the plan.

## 2017-10-08 NOTE — Progress Notes (Signed)
Transported pt from ICU 10 to CT then back to ICU 10 without complications.

## 2017-10-08 NOTE — Progress Notes (Signed)
Dr. Erlinda Hong notified of temperature of 103.9, treatment with tylenol and temperature of 102.1 rectally 2 hours later.

## 2017-10-08 NOTE — Progress Notes (Signed)
Pharmacy Antibiotic Note  Tammy Blair is a 65 y.o. female admitted on 10/07/2017 with sepsis/ UTI.  Pharmacy has been consulted for Cefepime dosing. Patient spiked temperature and cefepime was restarted. CrCl improved.  Plan: Continue Cefepime 1 gm IV q8h F/U cxs and clinical progress Monitor V/S, labs  Weight: 203 lb 11.3 oz (92.4 kg)  Temp (24hrs), Avg:99 F (37.2 C), Min:97.6 F (36.4 C), Max:102 F (38.9 C)   Recent Labs Lab 10/07/17 0310 10/07/17 0341 10/07/17 1222 10/08/17 0423  WBC 11.3*  --   --  12.6*  CREATININE 1.45*  --  1.03* 0.78  LATICACIDVEN  --  1.60  --   --     Estimated Creatinine Clearance: 86 mL/min (by C-G formula based on SCr of 0.78 mg/dL).    No Known Allergies  Antimicrobials this admission: Cefepime 10/27 >> 10/28 restarted 10/28>> Ceftriaxone 10/28>>10/28 Dose adjustments this admission: 10/28  Microbiology results: 10/27 BCx: ngtd 10/27 UCx: no growth 10/27 MRSA PCR is neg  Thank you for allowing pharmacy to be a part of this patient's care.  Isac Sarna, BS [place], [place] Clinical Pharmacist Pager [phone] 10/08/2017 7:21 PM

## 2017-10-09 DIAGNOSIS — J9601 Acute respiratory failure with hypoxia: Secondary | ICD-10-CM

## 2017-10-09 LAB — CBC WITH DIFFERENTIAL/PLATELET
BASOS PCT: 1 %
Basophils Absolute: 0.1 10*3/uL (ref 0.0–0.1)
EOS ABS: 0 10*3/uL (ref 0.0–0.7)
Eosinophils Relative: 0 %
HCT: 38.5 % (ref 36.0–46.0)
Hemoglobin: 12.9 g/dL (ref 12.0–15.0)
Lymphocytes Relative: 13 %
Lymphs Abs: 1.3 10*3/uL (ref 0.7–4.0)
MCH: 31.9 pg (ref 26.0–34.0)
MCHC: 33.5 g/dL (ref 30.0–36.0)
MCV: 95.1 fL (ref 78.0–100.0)
MONO ABS: 0.6 10*3/uL (ref 0.1–1.0)
Monocytes Relative: 6 %
Neutro Abs: 8 10*3/uL — ABNORMAL HIGH (ref 1.7–7.7)
Neutrophils Relative %: 80 %
PLATELETS: ADEQUATE 10*3/uL (ref 150–400)
RBC: 4.05 MIL/uL (ref 3.87–5.11)
RDW: 13.6 % (ref 11.5–15.5)
Smear Review: ADEQUATE
WBC: 10 10*3/uL (ref 4.0–10.5)

## 2017-10-09 LAB — BASIC METABOLIC PANEL
Anion gap: 10 (ref 5–15)
BUN: 16 mg/dL (ref 6–20)
CALCIUM: 9.1 mg/dL (ref 8.9–10.3)
CO2: 27 mmol/L (ref 22–32)
CREATININE: 0.73 mg/dL (ref 0.44–1.00)
Chloride: 102 mmol/L (ref 101–111)
GFR calc Af Amer: >60 mL/min (ref >60)
GFR calc non Af Amer: >60 mL/min (ref >60)
GLUCOSE: 154 mg/dL — AB (ref 65–99)
Potassium: 3.6 mmol/L (ref 3.5–5.1)
Sodium: 139 mmol/L (ref 135–145)

## 2017-10-09 LAB — GLUCOSE, CAPILLARY
GLUCOSE-CAPILLARY: 172 mg/dL — AB (ref 65–99)
Glucose-Capillary: 160 mg/dL — ABNORMAL HIGH (ref 65–99)
Glucose-Capillary: 207 mg/dL — ABNORMAL HIGH (ref 65–99)
Glucose-Capillary: 240 mg/dL — ABNORMAL HIGH (ref 65–99)

## 2017-10-09 LAB — HIV ANTIBODY (ROUTINE TESTING W REFLEX): HIV Screen 4th Generation wRfx: NONREACTIVE

## 2017-10-09 MED ORDER — METHYLPREDNISOLONE SODIUM SUCC 40 MG IJ SOLR
40.0000 mg | Freq: Two times a day (BID) | INTRAMUSCULAR | Status: DC
  Administered 2017-10-09 – 2017-10-11 (×5): 40 mg via INTRAVENOUS
  Filled 2017-10-09 (×5): qty 1

## 2017-10-09 MED ORDER — HALOPERIDOL 2 MG PO TABS
2.0000 mg | ORAL_TABLET | ORAL | Status: DC | PRN
  Administered 2017-10-09: 2 mg via ORAL
  Filled 2017-10-09: qty 1

## 2017-10-09 MED ORDER — PIPERACILLIN-TAZOBACTAM 3.375 G IVPB
3.3750 g | Freq: Three times a day (TID) | INTRAVENOUS | Status: DC
  Administered 2017-10-09 – 2017-10-11 (×7): 3.375 g via INTRAVENOUS
  Filled 2017-10-09 (×6): qty 50

## 2017-10-09 MED ORDER — HALOPERIDOL LACTATE 5 MG/ML IJ SOLN
2.0000 mg | INTRAMUSCULAR | Status: DC | PRN
  Administered 2017-10-10: 2 mg via INTRAMUSCULAR
  Filled 2017-10-09: qty 1

## 2017-10-09 MED ORDER — KETOROLAC TROMETHAMINE 30 MG/ML IJ SOLN
30.0000 mg | Freq: Once | INTRAMUSCULAR | Status: AC
  Administered 2017-10-09: 30 mg via INTRAVENOUS
  Filled 2017-10-09: qty 1

## 2017-10-09 MED ORDER — METOPROLOL TARTRATE 5 MG/5ML IV SOLN: 5.0000 mg | INTRAVENOUS | Status: DC | PRN

## 2017-10-09 MED ORDER — PIPERACILLIN-TAZOBACTAM 3.375 G IVPB 30 MIN: 3.3750 g | Freq: Four times a day (QID) | INTRAVENOUS | Status: DC

## 2017-10-09 MED ORDER — IPRATROPIUM-ALBUTEROL 0.5-2.5 (3) MG/3ML IN SOLN
3.0000 mL | Freq: Four times a day (QID) | RESPIRATORY_TRACT | Status: DC
  Administered 2017-10-09 – 2017-10-10 (×2): 3 mL via RESPIRATORY_TRACT
  Filled 2017-10-09 (×2): qty 3

## 2017-10-09 NOTE — Progress Notes (Signed)
Patient has been sedated and has done well on BiPAP. Noted though face is extremely red, Patient also has a high fever 103. Also appears swollen? Spoke with nurse . No ventilatory problems at this time.

## 2017-10-09 NOTE — Progress Notes (Signed)
In/Out cath performed on patient in beginning of shift to obtain an urine analysis.  During In/Out 956mL of urine was obtained.  Throughout the shift, little urine was collected via the PureWick.  Bladder scan performed and 433mL of urine was noted.  MD notified and order for Foley due to acute urinary retention was written. Peri care performed then 60F Foley was inserted without complications.

## 2017-10-09 NOTE — Progress Notes (Signed)
Patient is more alert and oriented tonight. Patient is pleasant, and following commands. Patient is drinking without complication. Patient able to take pills without complication. Patient has removed oxygen and maintaining O2 saturation 92-97% on room air. Patient understand the importance of wearing Bipap at night and said she would do it.  Will continue to monitor.

## 2017-10-09 NOTE — Consult Note (Addendum)
Consult requested IF:[facility] hospitalists  Dr.Arrien Consult requested for: Pneumonia/respiratory failure  HPI: This is a 66 year old who is known to have COPD/asthma diabetes chronic hypoxic respiratory failure hypertension anxiety chronic back pain who was admitted to the hospital on the 27th with pneumonia/COPD exacerbation and probable urinary tract infection. She was placed on BiPAP in the emergency department and initially seemed to improve. She has been confused and had altered mental status. History is from the record because she's not really able to provide any history now. Apparently she had been having trouble for about 2 days prior to admission then became hard to arouse and was brought to the emergency department. Her urine showed significant abnormalities with white blood cells and bacteria blood gas showed a pH 7.25 PCO2 of 53 and a PO2 of 72. Drug screen showed benzodiazepines and cocaine. Chest x-ray which I personally reviewed showed a patchy opacity in the left upper lung field. She initially seemed to be improving then started having more spiking temperatures last night and this morning. CT was done which I also personally reviewed which shows pretty extensive infiltration in the left lung and some areas that would be consistent with aspiration bilaterally.  Past Medical History:  Diagnosis Date  . Anxiety   . Asthma   . Chronic back pain   . COPD (chronic obstructive pulmonary disease) ([place])    on home O2  . Diabetes mellitus without complication ([place])   . HOH (hard of hearing)   . Hypertension   . Lumbar radiculopathy      Family History  Problem Relation Age of Onset  . COPD Mother   . Cancer Mother        kidney  . Stroke Brother   . Asthma Sister      Social History   Social History  . Marital status: Divorced    Spouse name: N/A  . Number of children: N/A  . Years of education: N/A   Social History Main Topics  . Smoking status: Current Every Day Smoker     Packs/day: 0.50    Years: 40.00    Types: Cigarettes  . Smokeless tobacco: Never Used  . Alcohol use No  . Drug use: No  . Sexual activity: Not Currently   Other Topics Concern  . None   Social History Narrative  . None     ROS: Not obtainable because of her confusion    Objective: Vital signs in last 24 hours: Temp:  [98.3 F (36.8 C)-103.9 F (39.9 C)] 101.7 F (38.7 C) (10/29 0715) Pulse Rate:  [94-118] 105 (10/29 0715) Resp:  [18-37] 31 (10/29 0715) BP: (91-188)/(50-158) 148/50 (10/29 0700) SpO2:  [91 %-100 %] 95 % (10/29 0739) FiO2 (%):  [32 %-50 %] 32 % (10/29 0414) Weight:  [90.6 kg (199 lb 11.8 oz)] 90.6 kg (199 lb 11.8 oz) (10/29 0456) Weight change: -1.8 kg (-3 lb 15.5 oz) Last BM Date: 10/08/17  Intake/Output from previous day: 10/28 0701 - 10/29 0700 In: 2793.3 [P.O.:100; I.V.:2593.3; IV Piggyback:100] Out: 1150 [Urine:1150]  PHYSICAL EXAM Constitutional: She is awake but confused. Eyes: Pupils react EOMI. Ears nose mouth and throat: She seems to be hard of hearing. Throat is clear. Cardiovascular: Her heart is regular with normal heart sounds. Respiratory: She has bilateral rhonchi wheezing and rales. Gastrointestinal: Her abdomen is soft. Skin: Skin turgor is fair. Musculoskeletal: She is moving all 4 extremities and strength seems to be grossly normal. Neurological: Unable to assess psychiatric: Unable to assess  Lab Results: Basic Metabolic Panel:  Recent Labs  10/07/17 0310  10/08/17 0423 10/09/17 0423  NA 136  < > 136 139  K 4.2  < > 3.6 3.6  CL 99*  < > 103 102  CO2 24  < > 26 27  GLUCOSE 256*  < > 261* 154*  BUN 42*  < > 23* 16  CREATININE 1.45*  < > 0.78 0.73  CALCIUM 9.1  < > 8.6* 9.1  MG 2.4  --   --   --   PHOS 3.5  --   --   --   < > = values in this interval not displayed. Liver Function Tests:  Recent Labs  10/07/17 0310 10/08/17 0423  AST 73* 68*  ALT 38 35  ALKPHOS 75 65  BILITOT 0.6 0.3  PROT 8.5* 6.7  ALBUMIN  3.1* 2.6*   No results for input(s): LIPASE, AMYLASE in the last 72 hours. No results for input(s): AMMONIA in the last 72 hours. CBC:  Recent Labs  10/08/17 0423 10/09/17 0423  WBC 12.6* 10.0  NEUTROABS 10.9* 8.0*  HGB 12.1 12.9  HCT 37.3 38.5  MCV 94.7 95.1  PLT 226 PLATELET CLUMPS NOTED ON SMEAR, COUNT APPEARS ADEQUATE   Cardiac Enzymes:  Recent Labs  10/07/17 0310  TROPONINI <0.03   BNP: No results for input(s): PROBNP in the last 72 hours. D-Dimer: No results for input(s): DDIMER in the last 72 hours. CBG:  Recent Labs  10/07/17 1634 10/07/17 2026 10/08/17 0749 10/08/17 1113 10/08/17 1559 10/08/17 2336  GLUCAP 229* 292* 216* 169* 146* 159*   Hemoglobin A1C: No results for input(s): HGBA1C in the last 72 hours. Fasting Lipid Panel: No results for input(s): CHOL, HDL, LDLCALC, TRIG, CHOLHDL, LDLDIRECT in the last 72 hours. Thyroid Function Tests: No results for input(s): TSH, T4TOTAL, FREET4, T3FREE, THYROIDAB in the last 72 hours. Anemia Panel: No results for input(s): VITAMINB12, FOLATE, FERRITIN, TIBC, IRON, RETICCTPCT in the last 72 hours. Coagulation: No results for input(s): LABPROT, INR in the last 72 hours. Urine Drug Screen: Drugs of Abuse     Component Value Date/Time   LABOPIA NONE DETECTED 10/07/2017 0258   COCAINSCRNUR POSITIVE (A) 10/07/2017 0258   LABBENZ POSITIVE (A) 10/07/2017 0258   AMPHETMU NONE DETECTED 10/07/2017 0258   THCU NONE DETECTED 10/07/2017 0258   LABBARB NONE DETECTED 10/07/2017 0258    Alcohol Level:  Recent Labs  10/07/17 0310  ETH <10   Urinalysis:  Recent Labs  10/07/17 0258 10/08/17 2050  COLORURINE AMBER* YELLOW  LABSPEC 1.019 1.020  PHURINE 5.0 5.0  GLUCOSEU NEGATIVE 50*  HGBUR MODERATE* SMALL*  BILIRUBINUR NEGATIVE NEGATIVE  KETONESUR NEGATIVE NEGATIVE  PROTEINUR 30* 30*  NITRITE NEGATIVE NEGATIVE  LEUKOCYTESUR MODERATE* NEGATIVE   Misc. Labs:   ABGS:  Recent Labs  10/08/17 0948   PHART 7.342*  PO2ART 85.0  HCO3 24.5     MICROBIOLOGY: Recent Results (from the past 240 hour(s))  Urine culture     Status: None   Collection Time: 10/07/17  2:58 AM  Result Value Ref Range Status   Specimen Description URINE, CATHETERIZED  Final   Special Requests NONE  Final   Culture   Final    NO GROWTH Performed at [facility], [place] [address], [place], [place] [address]    Report Status 10/08/2017 FINAL  Final  Blood culture (routine x 2)     Status: None (Preliminary result)   Collection Time: 10/07/17  3:11  AM  Result Value Ref Range Status   Specimen Description RIGHT ANTECUBITAL  Final   Special Requests   Final    BOTTLES DRAWN AEROBIC AND ANAEROBIC Blood Culture adequate volume   Culture NO GROWTH 1 DAY  Final   Report Status PENDING  Incomplete  Blood culture (routine x 2)     Status: None (Preliminary result)   Collection Time: 10/07/17  3:30 AM  Result Value Ref Range Status   Specimen Description BLOOD RIGHT HAND  Final   Special Requests   Final    BOTTLES DRAWN AEROBIC ONLY Blood Culture results may not be optimal due to an inadequate volume of blood received in culture bottles   Culture NO GROWTH 1 DAY  Final   Report Status PENDING  Incomplete  MRSA PCR Screening     Status: None   Collection Time: 10/07/17  6:14 AM  Result Value Ref Range Status   MRSA by PCR NEGATIVE NEGATIVE Final    Comment:        The GeneXpert MRSA Assay (FDA approved for NASAL specimens only), is one component of a comprehensive MRSA colonization surveillance program. It is not intended to diagnose MRSA infection nor to guide or monitor treatment for MRSA infections.     Studies/Results: Ct Chest Wo Contrast  Result Date: 10/09/2017 CLINICAL DATA:  Pneumonia, unresolved or complicated. Fever. Possible aspiration. EXAM: CT CHEST WITHOUT CONTRAST TECHNIQUE: Multidetector CT imaging of the chest was performed following the standard protocol without IV contrast.  COMPARISON:  Radiographs yesterday. FINDINGS: Cardiovascular: Normal caliber thoracic aorta with atherosclerosis. Heart size upper normal. Breathing motion artifact obscures detailed evaluation. Mediastinum/Nodes: Enlarged AP window node measures 15 mm short axis. Prominent prevascular and lower paratracheal nodes. Limited assessment for hilar adenopathy given lack of IV contrast. Esophagus is decompressed. Visualized thyroid gland is normal. Lungs/Pleura: Breathing motion artifact partially obscures detailed evaluation. Dense consolidation throughout the left upper lobe with air bronchograms and surrounding ground-glass opacity. Consolidation is rather extensive and extends from the lingula to the apex. Bilateral dependent opacities in both lower lobes, partially obscured by motion. Minimal patchy opacities at the right lung apex. Possible small pleural effusions adjacent to lower lobe consolidations. Limited assessment of the trachea and mainstem bronchi for intraluminal debris given motion, no large filling defect. Upper Abdomen: Possible nodular hepatic contours. Musculoskeletal: No evidence of acute abnormality allowing for breathing motion. IMPRESSION: 1. Dense left upper lobe consolidation with air bronchograms and surrounding ground-glass opacities, suspicious for pneumonia. Aspiration is considered, however distribution favors lobar pneumonia. Recommend radiographic follow-up to resolution. 2. Additional dependent densities in both lung bases, more typical distribution for aspiration, alternatively could represent atelectasis. Possible small adjacent pleural effusions. 3. Mild mediastinal adenopathy is nonspecific but likely reactive. 4.  Aortic Atherosclerosis (ICD10-I70.0). 5. Possible nodular hepatic contours in the upper abdomen which can be seen with cirrhosis. Recommend correlation for cirrhosis risk factors. 6. Moderate motion limitations. Electronically Signed   By: Jeb Levering M.D.   On:  10/09/2017 00:26    Medications:  Prior to Admission:  Prescriptions Prior to Admission  Medication Sig Dispense Refill Last Dose  . albuterol (PROVENTIL HFA;VENTOLIN HFA) 108 (90 Base) MCG/ACT inhaler Inhale 1-2 puffs into the lungs every 4 (four) hours as needed for wheezing or shortness of breath. 1 Inhaler 0 Past Week at Unknown time  . albuterol (PROVENTIL) (2.5 MG/3ML) 0.083% nebulizer solution Take 3 mLs (2.5 mg total) by nebulization every 4 (four) hours as needed. For shortness of  breath 75 mL 0 unknown at unknown  . ALPRAZolam (XANAX) 0.5 MG tablet Take 0.5 mg by mouth 3 (three) times daily as needed for anxiety. For anxiety   Past Week at Unknown time  . budesonide-formoterol (SYMBICORT) 160-4.5 MCG/ACT inhaler Inhale 1 puff into the lungs 2 (two) times daily.    Past Week at Unknown time  . cyclobenzaprine (FLEXERIL) 10 MG tablet Take 10 mg by mouth 3 (three) times daily as needed for muscle spasms. For muscle spasm   Past Week at Unknown time  . gabapentin (NEURONTIN) 300 MG capsule Take 300 mg by mouth 3 (three) times daily.   Past Week at Unknown time  . metFORMIN (GLUCOPHAGE-XR) 500 MG 24 hr tablet Take 500 mg by mouth every evening. With supper.   Past Week at Unknown time  . naproxen (NAPROSYN) 500 MG tablet Take 1 tablet (500 mg total) by mouth 2 (two) times daily with a meal. 30 tablet 0 unknown at unknown  . Oxycodone HCl 10 MG TABS Take 10 mg by mouth 4 (four) times daily as needed for pain.   Past Week at Unknown time  . ranitidine (ZANTAC) 150 MG tablet Take 150 mg by mouth 2 (two) times daily.   Past Week at Unknown time  . metoprolol (LOPRESSOR) 100 MG tablet Take 100 mg by mouth 2 (two) times daily.   10/05/2017 at unknown  . tiotropium (SPIRIVA) 18 MCG inhalation capsule Place 18 mcg into inhaler and inhale daily.   08/19/2017 at Unknown time   Scheduled: . chlorhexidine  15 mL Mouth Rinse BID  . enoxaparin (LOVENOX) injection  40 mg Subcutaneous Q24H  . insulin  aspart  0-9 Units Subcutaneous TID WC  . ipratropium-albuterol  3 mL Nebulization Q4H  . mouth rinse  15 mL Mouth Rinse q12n4p  . metoprolol tartrate  50 mg Oral BID   Continuous: . sodium chloride 50 mL/hr at 10/08/17 1317  . piperacillin-tazobactam (ZOSYN)  IV     XIP:[id number], haloperidol **OR** haloperidol lactate, hydrALAZINE, ipratropium-albuterol, nicotine, ondansetron **OR** ondansetron (ZOFRAN) IV  Assesment: She was admitted with sepsis thought to be secondary to urinary tract infection but was some question of pneumonia. She has had altered mental status and I think it's very likely that she has aspirated. Her antibiotics had been narrowed but I think now she is going to need broad-spectrum antibiotics again and preference would be for her to be on Zosyn considering the possibility of aspiration. She has extensive pneumonia by CT. At baseline she has COPD which is severe and has chronic hypoxic respiratory failure. I would add moderate dose steroids because of her COPD and it may help with the inflammatory response from her pneumonia this may cause some trouble with her diabetes. Considering the fact that she has had increasing problems she has substantial risk of requiring intubation and mechanical ventilation in the next 24 hours. Principal Problem:   Sepsis secondary to UTI [facility]) Active Problems:   Essential hypertension   COPD exacerbation (HCC)   GAD (generalized anxiety disorder)   Chronic pain syndrome   Tobacco use disorder   Chronic respiratory failure (HCC)   Diabetes mellitus (HCC)   Tachycardia   Cocaine abuse ([place])    Plan: As above. Use BiPAP based on her clinical response.    LOS: 2 days   Tammy Blair L 10/09/2017, 7:56 AM

## 2017-10-09 NOTE — Plan of Care (Signed)
Problem: Safety: Goal: Ability to remain free from injury will improve Outcome: Progressing Bed was set in lowest position, bed was locked, and bed alarm was on.  Pt bed is near the nurse's station.

## 2017-10-09 NOTE — Plan of Care (Signed)
Problem: Education: Goal: Knowledge of [facility] Education information/materials will improve Outcome: Progressing Explained to the family the reason for the CT and In/Out cath; additionally explained to the family why patient needed to have a Foley placed.  Family had patient's home medications at bedside, instructed family to take medications home as they would not be used in the hospital.

## 2017-10-09 NOTE — Progress Notes (Signed)
Patient on BIPAP doing well so far.

## 2017-10-09 NOTE — Plan of Care (Signed)
Problem: Nutrition: Goal: Adequate nutrition will be maintained Outcome: Not Progressing Patient refusing most solid foods.  Takes sips of liquids occasionally.   Problem: Bowel/Gastric: Goal: Will not experience complications related to bowel motility Outcome: Progressing Bowel movement 10/28

## 2017-10-09 NOTE — Progress Notes (Signed)
Call from RN. Patient took herself off BIPAP twice. Placed her back on her 3L. Bipap on standby at bedside. Will continue to monitor.

## 2017-10-09 NOTE — Progress Notes (Addendum)
PROGRESS NOTE    KANI JOBSON  WCB:[id number] DOB: [birth year] DOA: 10/07/2017 PCP: [name], MD    Brief Narrative:  66 year old female who presents with altered mental status. Patient does have significant past medical history of anxiety, asthma, chronic back pain, COPD on home oxygen, type 2 diabetes mellitus and hypertension. For the last 48 hours patient has been very somnolent to the point where she was difficult to be aroused. EMS was called and patient was brought into the hospital. On initial physical examination her temperature was 102.4, heart rate 120, respiratory rate 28, blood pressure 172/56, oxygen saturation 89 %. She was obtunded, noted to have increased work of breathing and febrile, her mucous membranes were dry, her lungs had decreased breath sounds at bases with bilateral wheezing and rhonchi, heart S1-S2 present irregular, tachycardic, no gallops, rubs or murmurs, the abdomen was soft nontender, no lower extremity edema. Arterial blood gas 7.25/53.7/72.6/21.0/92% on 3 L nasal cannula. Sodium 136, potassium 4.2, chloride 99, bicarbonate 24, glucose 256, BUN 42, creatinine 1.45, anion gap 13, white count 11.3, hemoglobin 14.6, hematocrit 42.8, platelets 214, venous lactic acid 1.6. Urinalysis with too numerous to count white cells. Urine drug screen positive for benzodiazepines and cocaine. Alcohol level less than 10. Head CT negative for acute findings. Chest x-ray with right rotation, opacity, well-defined borders at the lateral aspect of the left upper lobe, no air bronchogram. EKG sinus rhythm, 120 bpm, premature atrial complexes, poor R-wave progression.   Patient was admitted to the hospital with working diagnosis of sepsis due to urinary tract infection, complicated by metabolic encephalopathy respiratory failure.  Patient with persistent fever, further workup with chest CT noted large pneumonic infiltrate on the left upper lobe.   Assessment & Plan:   Principal  Problem:   Sepsis secondary to UTI [facility]) Active Problems:   Essential hypertension   COPD exacerbation (HCC)   GAD (generalized anxiety disorder)   Chronic pain syndrome   Tobacco use disorder   Chronic respiratory failure (HCC)   Diabetes mellitus (HCC)   Tachycardia   Cocaine abuse ([place])   1. Sepsis due to aspiration pneumonia on the left upper lobe, present on admission. Personally reviewed chest CT, noted large left upper lobe infiltrate with positive air bronchogram. Will change antibiotic therapy with IV zosyn, will continue temperature control with acetaminophen, follow on blood cultures, and cell count. Will continue low rate of IV fluids, follow a restrictive IV fluids strategy.   2. Hypoxic and hypercapnic respiratory failure, acute on chronic. Successfully liberated to [place], patient is more awake and alert, will continue aspiration precautions, will continue to use bipap as needed. Oxymetry monitoring, keep 02 sat above 88%.  3. Metabolic encephalopathy with delirium. Continue neuro checks q 4 hours, po as tolerated, aspiration precautions. Today more awake and reactive. Will increase haldol to 2 mg as needed, avoid benzodiazepines.   4. Acute kidney injury. Serum cr at 0.73, with serum bicarbonate at 27 and serum K at 3,6, will continue close follow up on renal function and electrolytes, will continue low rate on NS for maintenance fluids, patient with poor oral intake and increased insensible losses.   4. COPD with exacerbation. On bronchodilator therapy, continue oxymetry monitoring, added systemic steroids per recommendations from Dr Luan Pulling, pulmonary. Patient with advanced COPD.   5. Hypertension. Patient has been not able to tolerate po, to take po metoprolol, will change to IV to use as needed for tachycardia and will continue as needed hydralazine for blood  pressure systolic greater than 242 bpm.  6. Type 2 diabetes mellitus. Capillary glucose 169, 146, 159, 172,  160. Continue to hold on basal insulin for now, will continue sliding scale for glucose cover and monitoring, poor oral intake.    7. Urine infection, present on admission. Will continue antibiotic therapy with Zosyn for now.   DVT prophylaxis:enoxaparin Code Status:full Family Communication:I spoke with patient proxy of care at the bedside and all questions were addressed Disposition Plan:SNF   Consultants:    Procedures:    Antimicrobials:   Zosyn    Subjective: Patient with persistent fever last night, associated with confusion and agitation, no nausea or vomiting, no chest pain. This am more awake and reactive, but still confused and disorientated, most information from nursing at the bedside.   Objective: Vitals:   10/09/17 0658 10/09/17 0700 10/09/17 0715 10/09/17 0739  BP:  (!) 148/50    Pulse: (!) 115 (!) 113 (!) 105   Resp: 19 (!) 33 (!) 31   Temp: (!) 102.2 F (39 C) (!) 102 F (38.9 C) (!) 101.7 F (38.7 C)   TempSrc:      SpO2: 95% 95% 95% 95%  Weight:        Intake/Output Summary (Last 24 hours) at 10/09/17 0815 Last data filed at 10/09/17 6834  Gross per 24 hour  Intake          2793.32 ml  Output             1850 ml  Net           943.32 ml   Filed Weights   10/07/17 0231 10/08/17 0453 10/09/17 0456  Weight: 95.3 kg (210 lb) 92.4 kg (203 lb 11.3 oz) 90.6 kg (199 lb 11.8 oz)    Examination:   General: very deconditioned and ill looking appearing Neurology: disorientated and confused, not focal.  E ENT: mild pallor, no icterus, oral mucosa moist Cardiovascular: No JVD. S1-S2 present, rhythmic, no gallops, rubs, or murmurs. No lower extremity edema. Pulmonary: decreased breath sounds bilaterally, poor air movement, no wheezing, but diffuse bilateral rhonchi and rales. Gastrointestinal. Abdomen protuberant, no organomegaly, non tender, no rebound or guarding Skin. No rashes Musculoskeletal: no joint deformities     Data  Reviewed: I have personally reviewed following labs and imaging studies  CBC:  Recent Labs Lab 10/07/17 0310 10/08/17 0423 10/09/17 0423  WBC 11.3* 12.6* 10.0  NEUTROABS 9.1* 10.9* 8.0*  HGB 14.6 12.1 12.9  HCT 42.8 37.3 38.5  MCV 93.9 94.7 95.1  PLT 214 226 PLATELET CLUMPS NOTED ON SMEAR, COUNT APPEARS ADEQUATE   Basic Metabolic Panel:  Recent Labs Lab 10/07/17 0310 10/07/17 1222 10/08/17 0423 10/09/17 0423  NA 136 137 136 139  K 4.2 4.0 3.6 3.6  CL 99* 105 103 102  CO2 24 25 26 27   GLUCOSE 256* 283* 261* 154*  BUN 42* 33* 23* 16  CREATININE 1.45* 1.03* 0.78 0.73  CALCIUM 9.1 8.4* 8.6* 9.1  MG 2.4  --   --   --   PHOS 3.5  --   --   --    GFR: Estimated Creatinine Clearance: 85.2 mL/min (by C-G formula based on SCr of 0.73 mg/dL). Liver Function Tests:  Recent Labs Lab 10/07/17 0310 10/08/17 0423  AST 73* 68*  ALT 38 35  ALKPHOS 75 65  BILITOT 0.6 0.3  PROT 8.5* 6.7  ALBUMIN 3.1* 2.6*   No results for input(s): LIPASE, AMYLASE in the last 168  hours. No results for input(s): AMMONIA in the last 168 hours. Coagulation Profile: No results for input(s): INR, PROTIME in the last 168 hours. Cardiac Enzymes:  Recent Labs Lab 10/07/17 0310  TROPONINI <0.03   BNP (last 3 results) No results for input(s): PROBNP in the last 8760 hours. HbA1C: No results for input(s): HGBA1C in the last 72 hours. CBG:  Recent Labs Lab 10/08/17 0749 10/08/17 1113 10/08/17 1559 10/08/17 2336 10/09/17 0800  GLUCAP 216* 169* 146* 159* 172*   Lipid Profile: No results for input(s): CHOL, HDL, LDLCALC, TRIG, CHOLHDL, LDLDIRECT in the last 72 hours. Thyroid Function Tests: No results for input(s): TSH, T4TOTAL, FREET4, T3FREE, THYROIDAB in the last 72 hours. Anemia Panel: No results for input(s): VITAMINB12, FOLATE, FERRITIN, TIBC, IRON, RETICCTPCT in the last 72 hours.    Radiology Studies: I have reviewed all of the imaging during this hospital visit  personally     Scheduled Meds: . chlorhexidine  15 mL Mouth Rinse BID  . enoxaparin (LOVENOX) injection  40 mg Subcutaneous Q24H  . insulin aspart  0-9 Units Subcutaneous TID WC  . ipratropium-albuterol  3 mL Nebulization Q4H  . mouth rinse  15 mL Mouth Rinse q12n4p  . methylPREDNISolone (SOLU-MEDROL) injection  40 mg Intravenous Q12H  . metoprolol tartrate  50 mg Oral BID   Continuous Infusions: . sodium chloride 50 mL/hr at 10/08/17 1317  . piperacillin-tazobactam (ZOSYN)  IV       LOS: 2 days        Maddelynn Moosman Gerome Apley, MD [facility] Pager [phone]

## 2017-10-09 NOTE — Progress Notes (Signed)
Pt took her bipap mask off twice. Family assisted. Asked family to not touch the equipment and to encourage patient to keep bipap on. Also informed family to come get staff when needing assistance. Patient stated mask is hurting her face and she had to cough. Patient is coughing up phlegm which is a tan color.   Margaret Pyle, RN

## 2017-10-10 DIAGNOSIS — J9621 Acute and chronic respiratory failure with hypoxia: Secondary | ICD-10-CM

## 2017-10-10 DIAGNOSIS — J189 Pneumonia, unspecified organism: Secondary | ICD-10-CM

## 2017-10-10 DIAGNOSIS — J9622 Acute and chronic respiratory failure with hypercapnia: Secondary | ICD-10-CM

## 2017-10-10 LAB — CBC WITH DIFFERENTIAL/PLATELET
BASOS ABS: 0.1 10*3/uL (ref 0.0–0.1)
BASOS PCT: 0 %
Eosinophils Absolute: 0 10*3/uL (ref 0.0–0.7)
Eosinophils Relative: 0 %
HEMATOCRIT: 35.9 % — AB (ref 36.0–46.0)
HEMOGLOBIN: 11.6 g/dL — AB (ref 12.0–15.0)
LYMPHS PCT: 8 %
Lymphs Abs: 1.1 10*3/uL (ref 0.7–4.0)
MCH: 30.5 pg (ref 26.0–34.0)
MCHC: 32.3 g/dL (ref 30.0–36.0)
MCV: 94.5 fL (ref 78.0–100.0)
MONO ABS: 0.6 10*3/uL (ref 0.1–1.0)
Monocytes Relative: 4 %
NEUTROS ABS: 11.4 10*3/uL — AB (ref 1.7–7.7)
NEUTROS PCT: 88 %
Platelets: 242 10*3/uL (ref 150–400)
RBC: 3.8 MIL/uL — AB (ref 3.87–5.11)
RDW: 13.7 % (ref 11.5–15.5)
WBC: 13.1 10*3/uL — ABNORMAL HIGH (ref 4.0–10.5)

## 2017-10-10 LAB — GLUCOSE, CAPILLARY
GLUCOSE-CAPILLARY: 195 mg/dL — AB (ref 65–99)
GLUCOSE-CAPILLARY: 262 mg/dL — AB (ref 65–99)
Glucose-Capillary: 191 mg/dL — ABNORMAL HIGH (ref 65–99)

## 2017-10-10 LAB — BASIC METABOLIC PANEL
ANION GAP: 6 (ref 5–15)
BUN: 22 mg/dL — ABNORMAL HIGH (ref 6–20)
CALCIUM: 8.6 mg/dL — AB (ref 8.9–10.3)
CHLORIDE: 102 mmol/L (ref 101–111)
CO2: 29 mmol/L (ref 22–32)
Creatinine, Ser: 0.58 mg/dL (ref 0.44–1.00)
GFR calc non Af Amer: >60 mL/min (ref >60)
GLUCOSE: 206 mg/dL — AB (ref 65–99)
POTASSIUM: 3.5 mmol/L (ref 3.5–5.1)
Sodium: 137 mmol/L (ref 135–145)

## 2017-10-10 MED ORDER — CYCLOBENZAPRINE HCL 10 MG PO TABS: 5.0000 mg | ORAL_TABLET | Freq: Three times a day (TID) | ORAL | Status: DC | PRN

## 2017-10-10 MED ORDER — OXYCODONE HCL 5 MG PO TABS
5.0000 mg | ORAL_TABLET | ORAL | Status: DC | PRN
  Administered 2017-10-10 – 2017-10-12 (×7): 5 mg via ORAL
  Filled 2017-10-10 (×7): qty 1

## 2017-10-10 MED ORDER — IPRATROPIUM-ALBUTEROL 0.5-2.5 (3) MG/3ML IN SOLN
3.0000 mL | Freq: Three times a day (TID) | RESPIRATORY_TRACT | Status: DC
  Administered 2017-10-10 – 2017-10-12 (×6): 3 mL via RESPIRATORY_TRACT
  Filled 2017-10-10 (×6): qty 3

## 2017-10-10 MED ORDER — METOPROLOL TARTRATE 50 MG PO TABS
50.0000 mg | ORAL_TABLET | Freq: Two times a day (BID) | ORAL | Status: DC
  Administered 2017-10-10 – 2017-10-12 (×5): 50 mg via ORAL
  Filled 2017-10-10 (×5): qty 1

## 2017-10-10 MED ORDER — ALPRAZOLAM 0.5 MG PO TABS
0.5000 mg | ORAL_TABLET | Freq: Three times a day (TID) | ORAL | Status: DC | PRN
  Administered 2017-10-10 – 2017-10-11 (×4): 0.5 mg via ORAL
  Filled 2017-10-10 (×4): qty 1

## 2017-10-10 MED ORDER — MOMETASONE FURO-FORMOTEROL FUM 200-5 MCG/ACT IN AERO
2.0000 | INHALATION_SPRAY | Freq: Two times a day (BID) | RESPIRATORY_TRACT | Status: DC
  Administered 2017-10-10 – 2017-10-12 (×5): 2 via RESPIRATORY_TRACT
  Filled 2017-10-10: qty 8.8

## 2017-10-10 MED ORDER — GABAPENTIN 300 MG PO CAPS
300.0000 mg | ORAL_CAPSULE | Freq: Three times a day (TID) | ORAL | Status: DC
  Administered 2017-10-10 – 2017-10-12 (×7): 300 mg via ORAL
  Filled 2017-10-10 (×7): qty 1

## 2017-10-10 NOTE — Progress Notes (Signed)
Patient became combative and aggressive after lab woke her and stuck her for blood. Pt trying to pull out foley and get out of bed stating "I need to get up and think about some things." Pt also stated she was going outside to smoke a cigarette. Pt's boyfriend and son at bedside trying to calm her down. Pt swung at boyfriend. PRN Haldol given. Nicotine patch placed on L arm.  Margaret Pyle, RN

## 2017-10-10 NOTE — Evaluation (Signed)
Physical Therapy Evaluation Patient Details Name: Tammy Blair MRN: [id number] DOB: [birth year] Today's Date: 10/10/2017   History of Present Illness  Tammy Blair is a 66 y.o. female with medical history significant of anxiety, asthma, chronic back pain, lumbar radiculopathy, COPD on home oxygen, type 2 diabetes, impaired hearing, hypertension who is being brought via EMS to the emergency department due to altered mental status.    Clinical Impression  Patient limited for functional mobility and gait secondary to c/o fatigue and SOB, limited to a few steps at bedside and demonstrates fair/good return for repositioning self in bed.  Patient will benefit from continued physical therapy in hospital and recommended venue below to increase strength, balance, endurance for safe ADLs and gait.    Follow Up Recommendations Home health PT;Supervision/Assistance - 24 hour    Equipment Recommendations  None recommended by PT    Recommendations for Other Services       Precautions / Restrictions Precautions Precautions: Fall Restrictions Weight Bearing Restrictions: No      Mobility  Bed Mobility Overal bed mobility: Needs Assistance Bed Mobility: Supine to Sit;Sit to Supine     Supine to sit: Min assist Sit to supine: Min guard      Transfers Overall transfer level: Needs assistance Equipment used: Rolling walker (2 wheeled) Transfers: Sit to/from Stand Sit to Stand: Min assist            Ambulation/Gait Ambulation/Gait assistance: Mod assist Ambulation Distance (Feet): 3 Feet Assistive device: Rolling walker (2 wheeled) Gait Pattern/deviations: Decreased step length - right;Decreased step length - left;Decreased stride length   Gait velocity interpretation: Below normal speed for age/gender General Gait Details: Patient limited to 4-5 steps forward/backwards at bedside x 2 trials, limited secondary to c/o fatigue/SOB while on 3 LPM, required 3-4 minute sitting rest break  before taking more steps at bedside  Stairs            Wheelchair Mobility    Modified Rankin (Stroke Patients Only)       Balance Overall balance assessment: Needs assistance Sitting-balance support: Feet supported;Bilateral upper extremity supported Sitting balance-Leahy Scale: Fair     Standing balance support: Bilateral upper extremity supported;During functional activity Standing balance-Leahy Scale: Fair                               Pertinent Vitals/Pain Pain Assessment: 0-10 Pain Score: 8  Pain Location: BLE, left hip Pain Descriptors / Indicators: Aching;Discomfort Pain Intervention(s): Limited activity within patient's tolerance;Monitored during session    Home Living Family/patient expects to be discharged to:: Private residence Living Arrangements: Children Available Help at Discharge: Family Type of Home: House Home Access: Stairs to enter Entrance Stairs-Rails: Right;Left;Can reach both Entrance Stairs-Number of Steps: 8 Home Layout: One level Home Equipment: Environmental consultant - 4 wheels;Cane - single point      Prior Function Level of Independence: Independent with assistive device(s)               Hand Dominance        Extremity/Trunk Assessment   Upper Extremity Assessment Upper Extremity Assessment: Generalized weakness    Lower Extremity Assessment Lower Extremity Assessment: Generalized weakness    Cervical / Trunk Assessment Cervical / Trunk Assessment: Kyphotic  Communication   Communication: No difficulties  Cognition Arousal/Alertness: Awake/alert Behavior During Therapy: WFL for tasks assessed/performed Overall Cognitive Status: Within Functional Limits for tasks assessed  General Comments      Exercises     Assessment/Plan    PT Assessment Patient needs continued PT services  PT Problem List Decreased strength;Decreased activity tolerance;Decreased  balance;Decreased mobility       PT Treatment Interventions Gait training;Functional mobility training;Stair training;Therapeutic activities;Therapeutic exercise;Patient/family education    PT Goals (Current goals can be found in the Care Plan section)  Acute Rehab PT Goals Patient Stated Goal: return home with family to assist PT Goal Formulation: With patient Time For Goal Achievement: 10/17/17 Potential to Achieve Goals: Good    Frequency Min 3X/week   Barriers to discharge        Co-evaluation               AM-PAC PT "6 Clicks" Daily Activity  Outcome Measure Difficulty turning over in bed (including adjusting bedclothes, sheets and blankets)?: A Little Difficulty moving from lying on back to sitting on the side of the bed? : A Little Difficulty sitting down on and standing up from a chair with arms (e.g., wheelchair, bedside commode, etc,.)?: A Little Help needed moving to and from a bed to chair (including a wheelchair)?: A Little Help needed walking in hospital room?: A Lot Help needed climbing 3-5 steps with a railing? : Total 6 Click Score: 15    End of Session Equipment Utilized During Treatment: Gait belt;Oxygen Activity Tolerance: Patient limited by fatigue;Patient limited by pain Patient left: in bed;with call bell/phone within reach;with bed alarm set Nurse Communication: Mobility status PT Visit Diagnosis: Unsteadiness on feet (R26.81);Other abnormalities of gait and mobility (R26.89);Muscle weakness (generalized) (M62.81)    Time: 3785-8850 PT Time Calculation (min) (ACUTE ONLY): 28 min   Charges:     PT Treatments $Neuromuscular Re-education: 8-22 mins   PT G Codes:        12:07 PM, 2017-11-08 Lonell Grandchild, MPT Physical Therapist with [facility] [phone] office [phone] mobile phone

## 2017-10-10 NOTE — Evaluation (Signed)
Clinical/Bedside Swallow Evaluation Patient Details  Name: Tammy Blair MRN: [id number] Date of Birth: 27-Apr-1951  Today's Date: 10/10/2017 Time: SLP Start Time (ACUTE ONLY): 4132 SLP Stop Time (ACUTE ONLY): 1615 SLP Time Calculation (min) (ACUTE ONLY): 30 min  Past Medical History:  Past Medical History:  Diagnosis Date  . Anxiety   . Asthma   . Chronic back pain   . COPD (chronic obstructive pulmonary disease) ([place])    on home O2  . Diabetes mellitus without complication ([place])   . HOH (hard of hearing)   . Hypertension   . Lumbar radiculopathy    Past Surgical History:  Past Surgical History:  Procedure Laterality Date  . CESAREAN SECTION    . TONSILLECTOMY     HPI:  66 year old female who presents with altered mental status. Patient does have significant past medical history of anxiety, asthma, chronic back pain, COPD on home oxygen, type 2 diabetes mellitus and hypertension. For the last 48 hours patient has been very somnolent to the point where she was difficult to be aroused. EMS was called and patient was brought into the hospital. On initial physical examination her temperature was 102.4, heart rate 120, respiratory rate 28, blood pressure 172/56, oxygen saturation 89 %. She was obtunded, noted to have increased work of breathing and febrile, her mucous membranes were dry, her lungs had decreased breath sounds at bases with bilateral wheezing and rhonchi, heart S1-S2 present irregular, tachycardic, no gallops, rubs or murmurs, the abdomen was soft nontender, no lower extremity edema. Arterial blood gas 7.25/53.7/72.6/21.0/92% on 3 L nasal cannula. Sodium 136, potassium 4.2, chloride 99, bicarbonate 24, glucose 256, BUN 42, creatinine 1.45, anion gap 13, white count 11.3, hemoglobin 14.6, hematocrit 42.8, platelets 214, venous lactic acid 1.6. Urinalysis with too numerous to count white cells. Urine drug screen positive for benzodiazepines and cocaine. Alcohol level less than 10.  Head CT negative for acute findings. Chest x-ray with right rotation, opacity, well-defined borders at the lateral aspect of the left upper lobe, no air bronchogram. EKG sinus rhythm, 120 bpm, premature atrial complexes, poor R-wave progression. Patient was admitted to the hospital with working diagnosis of sepsis due to urinary tract infection, complicated by metabolic encephalopathy respiratory failure. Patient with persistent fever, further workup with chest CT noted large pneumonic infiltrate on the left upper lobe. BSE ordered.   Assessment / Plan / Recommendation Clinical Impression  Clinical swallow evaluation completed at bedside. Pt denies difficulty swallowing or recent emesis, but does endorse h/o reflux. Pt's partner stated that he gave Pt some chicken earlier today and "she acted like she was about to choke on it". Oral motor evaluation WNL; Pt with U/L dentures. Pt without overt signs or symptoms of aspiration at bedside during self presented thin via cup/straw, puree, and regular textures. SLP provided education to Pt and caregiver regarding aspiration risks in individuals with COPD. Recommend D3/mech soft due to compromised respiratory status and thin liquids. PO medications whole with water. Pt should only consume po when alert and upright and remain upright for 30 minutes after. SLP will follow during acute stay. No outpatient follow up indicated at this time.  SLP Visit Diagnosis: Dysphagia, oropharyngeal phase (R13.12)    Aspiration Risk  Mild aspiration risk    Diet Recommendation Dysphagia 3 (Mech soft);Thin liquid   Liquid Administration via: Cup;Straw Medication Administration: Whole meds with liquid Supervision: Patient able to self feed;Intermittent supervision to cue for compensatory strategies Compensations: Slow rate Postural Changes: Seated upright at  90 degrees;Remain upright for at least 30 minutes after po intake    Other  Recommendations Oral Care Recommendations:  Oral care BID;Staff/trained caregiver to provide oral care Other Recommendations: Clarify dietary restrictions   Follow up Recommendations None      Frequency and Duration min 2x/week  1 week       Prognosis Prognosis for Safe Diet Advancement: Good      Swallow Study   General Date of Onset: 10/07/17 HPI: 66 year old female who presents with altered mental status. Patient does have significant past medical history of anxiety, asthma, chronic back pain, COPD on home oxygen, type 2 diabetes mellitus and hypertension. For the last 48 hours patient has been very somnolent to the point where she was difficult to be aroused. EMS was called and patient was brought into the hospital. On initial physical examination her temperature was 102.4, heart rate 120, respiratory rate 28, blood pressure 172/56, oxygen saturation 89 %. She was obtunded, noted to have increased work of breathing and febrile, her mucous membranes were dry, her lungs had decreased breath sounds at bases with bilateral wheezing and rhonchi, heart S1-S2 present irregular, tachycardic, no gallops, rubs or murmurs, the abdomen was soft nontender, no lower extremity edema. Arterial blood gas 7.25/53.7/72.6/21.0/92% on 3 L nasal cannula. Sodium 136, potassium 4.2, chloride 99, bicarbonate 24, glucose 256, BUN 42, creatinine 1.45, anion gap 13, white count 11.3, hemoglobin 14.6, hematocrit 42.8, platelets 214, venous lactic acid 1.6. Urinalysis with too numerous to count white cells. Urine drug screen positive for benzodiazepines and cocaine. Alcohol level less than 10. Head CT negative for acute findings. Chest x-ray with right rotation, opacity, well-defined borders at the lateral aspect of the left upper lobe, no air bronchogram. EKG sinus rhythm, 120 bpm, premature atrial complexes, poor R-wave progression. Patient was admitted to the hospital with working diagnosis of sepsis due to urinary tract infection, complicated by metabolic  encephalopathy respiratory failure. Patient with persistent fever, further workup with chest CT noted large pneumonic infiltrate on the left upper lobe. BSE ordered. Type of Study: Bedside Swallow Evaluation Previous Swallow Assessment: None on record Diet Prior to this Study: Dysphagia 1 (puree);Thin liquids Temperature Spikes Noted: No Respiratory Status: Nasal cannula History of Recent Intubation: No Behavior/Cognition: Alert;Cooperative;Pleasant mood Oral Cavity Assessment: Within Functional Limits Oral Care Completed by SLP: Yes Oral Cavity - Dentition: Dentures, top;Dentures, bottom Vision: Functional for self-feeding Self-Feeding Abilities: Able to feed self Patient Positioning: Upright in bed Baseline Vocal Quality: Normal Volitional Cough: Weak Volitional Swallow: Able to elicit    Oral/Motor/Sensory Function Overall Oral Motor/Sensory Function: Within functional limits   Ice Chips Ice chips: Within functional limits Presentation: Spoon   Thin Liquid Thin Liquid: Within functional limits Presentation: Cup;Self Fed;Straw    Nectar Thick Nectar Thick Liquid: Not tested   Honey Thick Honey Thick Liquid: Not tested   Puree Puree: Within functional limits Presentation: Spoon   Solid   Thank you,  Genene Churn, CCC-SLP [phone]    Solid: Within functional limits Presentation: Self Fed        PORTER,DABNEY 10/10/2017,4:40 PM

## 2017-10-10 NOTE — Progress Notes (Signed)
Patient noted more alert and cooperative this shift. No behavioral disturbances noted this shift. Alert & oriented x 3, answering questions appropriately. Patient worked with PT this shift and assisted OOB up in chair by staff with chair alarm in place. Patient tolerating sitting up in chair well. Diet advanced to Dys 3, tolerating well. Able to make needs known and use call bell. Patient weaned to RA, O2 SAT 99% baseline wears O2 [place] @ home @ [facility] PRN. Family remained at bedside all shift.

## 2017-10-10 NOTE — Progress Notes (Signed)
Subjective: The events of last night are noted.  She has been agitated and combative.  This morning she and family are sleeping soundly.  She has received Haldol and a nicotine patch.  She opens her eyes but does not become fully awake when I   called her name.  Objective: Vital signs in last 24 hours: Temp:  [97.5 F (36.4 C)-99.1 F (37.3 C)] 97.7 F (36.5 C) (10/30 0600) Pulse Rate:  [56-107] 76 (10/30 0600) Resp:  [18-31] 21 (10/30 0600) BP: (117-167)/(49-96) 140/61 (10/30 0600) SpO2:  [92 %-100 %] 96 % (10/30 0600) Weight:  [91.2 kg (201 lb 1 oz)] 91.2 kg (201 lb 1 oz) (10/30 0500) Weight change: 0.6 kg (1 lb 5.2 oz) Last BM Date: 10/10/17  Intake/Output from previous day: 10/29 0701 - 10/30 0700 In: 2133.3 [P.O.:780; I.V.:1203.3; IV Piggyback:150] Out: 1350 [Urine:1350]  PHYSICAL EXAM General appearance: Sleeping, sedated, calm Resp: rhonchi bilaterally and wheezes bilaterally Cardio: regular rate and rhythm, S1, S2 normal, no murmur, click, rub or gallop GI: soft, non-tender; bowel sounds normal; no masses,  no organomegaly Extremities: extremities normal, atraumatic, no cyanosis or edema Skin warm and dry  Lab Results:  Results for orders placed or performed during the hospital encounter of 10/07/17 (from the past 48 hour(s))  Glucose, capillary     Status: Abnormal   Collection Time: 10/08/17  7:49 AM  Result Value Ref Range   Glucose-Capillary 216 (H) 65 - 99 mg/dL  Blood gas, arterial     Status: Abnormal   Collection Time: 10/08/17  9:48 AM  Result Value Ref Range   FIO2 45.00    Delivery systems BILEVEL POSITIVE AIRWAY PRESSURE    LHR 10 resp/min   Inspiratory PAP 16    Expiratory PAP 5    pH, Arterial 7.342 (Blair) 7.350 - 7.450   pCO2 arterial 48.9 (H) 32.0 - 48.0 mmHg   pO2, Arterial 85.0 83.0 - 108.0 mmHg   Bicarbonate 24.5 20.0 - 28.0 mmol/Blair   Acid-Base Excess 0.7 0.0 - 2.0 mmol/Blair   O2 Saturation 96.4 %   Collection site RIGHT BRACHIAL    Drawn by  COLLECTED BY RT    Sample type ARTERIAL    Allens test (pass/fail) NOT INDICATED (A) PASS  Glucose, capillary     Status: Abnormal   Collection Time: 10/08/17 11:13 AM  Result Value Ref Range   Glucose-Capillary 169 (H) 65 - 99 mg/dL  Glucose, capillary     Status: Abnormal   Collection Time: 10/08/17  3:59 PM  Result Value Ref Range   Glucose-Capillary 146 (H) 65 - 99 mg/dL  Urinalysis, Routine w reflex microscopic     Status: Abnormal   Collection Time: 10/08/17  8:50 PM  Result Value Ref Range   Color, Urine YELLOW YELLOW   APPearance CLEAR CLEAR   Specific Gravity, Urine 1.020 1.005 - 1.030   pH 5.0 5.0 - 8.0   Glucose, UA 50 (A) NEGATIVE mg/dL   Hgb urine dipstick SMALL (A) NEGATIVE   Bilirubin Urine NEGATIVE NEGATIVE   Ketones, ur NEGATIVE NEGATIVE mg/dL   Protein, ur 30 (A) NEGATIVE mg/dL   Nitrite NEGATIVE NEGATIVE   Leukocytes, UA NEGATIVE NEGATIVE   RBC / HPF 0-5 0 - 5 RBC/hpf   WBC, UA 0-5 0 - 5 WBC/hpf   Bacteria, UA NONE SEEN NONE SEEN   Squamous Epithelial / LPF 0-5 (A) NONE SEEN   Mucus PRESENT   Culture, blood (routine x 2)  Status: None (Preliminary result)   Collection Time: 10/08/17  9:24 PM  Result Value Ref Range   Specimen Description BLOOD RIGHT ARM    Special Requests      BOTTLES DRAWN AEROBIC ONLY Blood Culture adequate volume   Culture NO GROWTH < 12 HOURS    Report Status PENDING   Glucose, capillary     Status: Abnormal   Collection Time: 10/08/17 11:36 PM  Result Value Ref Range   Glucose-Capillary 159 (H) 65 - 99 mg/dL   Comment 1 Notify RN   Basic metabolic panel     Status: Abnormal   Collection Time: 10/09/17  4:23 AM  Result Value Ref Range   Sodium 139 135 - 145 mmol/Blair   Potassium 3.6 3.5 - 5.1 mmol/Blair   Chloride 102 101 - 111 mmol/Blair   CO2 27 22 - 32 mmol/Blair   Glucose, Bld 154 (H) 65 - 99 mg/dL   BUN 16 6 - 20 mg/dL   Creatinine, Ser 0.73 0.44 - 1.00 mg/dL   Calcium 9.1 8.9 - 10.3 mg/dL   GFR calc non Af Amer >60 >60 mL/min    GFR calc Af Amer >60 >60 mL/min    Comment: (NOTE) The eGFR has been calculated using the CKD EPI equation. This calculation has not been validated in all clinical situations. eGFR's persistently <60 mL/min signify possible Chronic Kidney Disease.    Anion gap 10 5 - 15  CBC with Differential/Platelet     Status: Abnormal   Collection Time: 10/09/17  4:23 AM  Result Value Ref Range   WBC 10.0 4.0 - 10.5 K/uL    Comment: WHITE COUNT CONFIRMED ON SMEAR   RBC 4.05 3.87 - 5.11 MIL/uL   Hemoglobin 12.9 12.0 - 15.0 g/dL   HCT 38.5 36.0 - 46.0 %   MCV 95.1 78.0 - 100.0 fL   MCH 31.9 26.0 - 34.0 pg   MCHC 33.5 30.0 - 36.0 g/dL   RDW 13.6 11.5 - 15.5 %   Platelets  150 - 400 K/uL    PLATELET CLUMPS NOTED ON SMEAR, COUNT APPEARS ADEQUATE   Neutrophils Relative % 80 %   Lymphocytes Relative 13 %   Monocytes Relative 6 %   Eosinophils Relative 0 %   Basophils Relative 1 %   Neutro Abs 8.0 (H) 1.7 - 7.7 K/uL   Lymphs Abs 1.3 0.7 - 4.0 K/uL   Monocytes Absolute 0.6 0.1 - 1.0 K/uL   Eosinophils Absolute 0.0 0.0 - 0.7 K/uL   Basophils Absolute 0.1 0.0 - 0.1 K/uL   WBC Morphology MILD LEFT SHIFT (1-5% METAS, OCC MYELO, OCC BANDS)    Smear Review      PLATELET CLUMPS NOTED ON SMEAR, COUNT APPEARS ADEQUATE  Culture, blood (Routine X 2) w Reflex to ID Panel     Status: None (Preliminary result)   Collection Time: 10/09/17  4:23 AM  Result Value Ref Range   Specimen Description RIGHT ANTECUBITAL    Special Requests      BOTTLES DRAWN AEROBIC AND ANAEROBIC Blood Culture adequate volume   Culture NO GROWTH <12 HOURS    Report Status PENDING   Glucose, capillary     Status: Abnormal   Collection Time: 10/09/17  8:00 AM  Result Value Ref Range   Glucose-Capillary 172 (H) 65 - 99 mg/dL  Glucose, capillary     Status: Abnormal   Collection Time: 10/09/17 11:28 AM  Result Value Ref Range   Glucose-Capillary 160 (H) 65 -  99 mg/dL  Glucose, capillary     Status: Abnormal   Collection Time:  10/09/17  4:16 PM  Result Value Ref Range   Glucose-Capillary 207 (H) 65 - 99 mg/dL  Glucose, capillary     Status: Abnormal   Collection Time: 10/09/17  9:16 PM  Result Value Ref Range   Glucose-Capillary 240 (H) 65 - 99 mg/dL  CBC with Differential/Platelet     Status: Abnormal   Collection Time: 10/10/17  5:14 AM  Result Value Ref Range   WBC 13.1 (H) 4.0 - 10.5 K/uL   RBC 3.80 (Blair) 3.87 - 5.11 MIL/uL   Hemoglobin 11.6 (Blair) 12.0 - 15.0 g/dL   HCT 35.9 (Blair) 36.0 - 46.0 %   MCV 94.5 78.0 - 100.0 fL   MCH 30.5 26.0 - 34.0 pg   MCHC 32.3 30.0 - 36.0 g/dL   RDW 13.7 11.5 - 15.5 %   Platelets 242 150 - 400 K/uL   Neutrophils Relative % 88 %   Neutro Abs 11.4 (H) 1.7 - 7.7 K/uL   Lymphocytes Relative 8 %   Lymphs Abs 1.1 0.7 - 4.0 K/uL   Monocytes Relative 4 %   Monocytes Absolute 0.6 0.1 - 1.0 K/uL   Eosinophils Relative 0 %   Eosinophils Absolute 0.0 0.0 - 0.7 K/uL   Basophils Relative 0 %   Basophils Absolute 0.1 0.0 - 0.1 K/uL  Basic metabolic panel     Status: Abnormal   Collection Time: 10/10/17  5:14 AM  Result Value Ref Range   Sodium 137 135 - 145 mmol/Blair   Potassium 3.5 3.5 - 5.1 mmol/Blair   Chloride 102 101 - 111 mmol/Blair   CO2 29 22 - 32 mmol/Blair   Glucose, Bld 206 (H) 65 - 99 mg/dL   BUN 22 (H) 6 - 20 mg/dL   Creatinine, Ser 0.58 0.44 - 1.00 mg/dL   Calcium 8.6 (Blair) 8.9 - 10.3 mg/dL   GFR calc non Af Amer >60 >60 mL/min   GFR calc Af Amer >60 >60 mL/min    Comment: (NOTE) The eGFR has been calculated using the CKD EPI equation. This calculation has not been validated in all clinical situations. eGFR's persistently <60 mL/min signify possible Chronic Kidney Disease.    Anion gap 6 5 - 15    ABGS  Recent Labs  10/08/17 0948  PHART 7.342*  PO2ART 85.0  HCO3 24.5   CULTURES Recent Results (from the past 240 hour(s))  Urine culture     Status: None   Collection Time: 10/07/17  2:58 AM  Result Value Ref Range Status   Specimen Description URINE, CATHETERIZED   Final   Special Requests NONE  Final   Culture   Final    NO GROWTH Performed at [facility], [place] [address], [place], [place] [address]    Report Status 10/08/2017 FINAL  Final  Blood culture (routine x 2)     Status: None (Preliminary result)   Collection Time: 10/07/17  3:11 AM  Result Value Ref Range Status   Specimen Description RIGHT ANTECUBITAL  Final   Special Requests   Final    BOTTLES DRAWN AEROBIC AND ANAEROBIC Blood Culture adequate volume   Culture NO GROWTH 2 DAYS  Final   Report Status PENDING  Incomplete  Blood culture (routine x 2)     Status: None (Preliminary result)   Collection Time: 10/07/17  3:30 AM  Result Value Ref Range Status   Specimen Description BLOOD  RIGHT HAND  Final   Special Requests   Final    BOTTLES DRAWN AEROBIC ONLY Blood Culture results may not be optimal due to an inadequate volume of blood received in culture bottles   Culture NO GROWTH 2 DAYS  Final   Report Status PENDING  Incomplete  MRSA PCR Screening     Status: None   Collection Time: 10/07/17  6:14 AM  Result Value Ref Range Status   MRSA by PCR NEGATIVE NEGATIVE Final    Comment:        The GeneXpert MRSA Assay (FDA approved for NASAL specimens only), is one component of a comprehensive MRSA colonization surveillance program. It is not intended to diagnose MRSA infection nor to guide or monitor treatment for MRSA infections.   Culture, blood (routine x 2)     Status: None (Preliminary result)   Collection Time: 10/08/17  9:24 PM  Result Value Ref Range Status   Specimen Description BLOOD RIGHT ARM  Final   Special Requests   Final    BOTTLES DRAWN AEROBIC ONLY Blood Culture adequate volume   Culture NO GROWTH < 12 HOURS  Final   Report Status PENDING  Incomplete  Culture, blood (Routine X 2) w Reflex to ID Panel     Status: None (Preliminary result)   Collection Time: 10/09/17  4:23 AM  Result Value Ref Range Status   Specimen Description RIGHT ANTECUBITAL   Final   Special Requests   Final    BOTTLES DRAWN AEROBIC AND ANAEROBIC Blood Culture adequate volume   Culture NO GROWTH <12 HOURS  Final   Report Status PENDING  Incomplete   Studies/Results: Ct Chest Wo Contrast  Result Date: 10/09/2017 CLINICAL DATA:  Pneumonia, unresolved or complicated. Fever. Possible aspiration. EXAM: CT CHEST WITHOUT CONTRAST TECHNIQUE: Multidetector CT imaging of the chest was performed following the standard protocol without IV contrast. COMPARISON:  Radiographs yesterday. FINDINGS: Cardiovascular: Normal caliber thoracic aorta with atherosclerosis. Heart size upper normal. Breathing motion artifact obscures detailed evaluation. Mediastinum/Nodes: Enlarged AP window node measures 15 mm short axis. Prominent prevascular and lower paratracheal nodes. Limited assessment for hilar adenopathy given lack of IV contrast. Esophagus is decompressed. Visualized thyroid gland is normal. Lungs/Pleura: Breathing motion artifact partially obscures detailed evaluation. Dense consolidation throughout the left upper lobe with air bronchograms and surrounding ground-glass opacity. Consolidation is rather extensive and extends from the lingula to the apex. Bilateral dependent opacities in both lower lobes, partially obscured by motion. Minimal patchy opacities at the right lung apex. Possible small pleural effusions adjacent to lower lobe consolidations. Limited assessment of the trachea and mainstem bronchi for intraluminal debris given motion, no large filling defect. Upper Abdomen: Possible nodular hepatic contours. Musculoskeletal: No evidence of acute abnormality allowing for breathing motion. IMPRESSION: 1. Dense left upper lobe consolidation with air bronchograms and surrounding ground-glass opacities, suspicious for pneumonia. Aspiration is considered, however distribution favors lobar pneumonia. Recommend radiographic follow-up to resolution. 2. Additional dependent densities in both lung  bases, more typical distribution for aspiration, alternatively could represent atelectasis. Possible small adjacent pleural effusions. 3. Mild mediastinal adenopathy is nonspecific but likely reactive. 4.  Aortic Atherosclerosis (ICD10-I70.0). 5. Possible nodular hepatic contours in the upper abdomen which can be seen with cirrhosis. Recommend correlation for cirrhosis risk factors. 6. Moderate motion limitations. Electronically Signed   By: Jeb Levering M.D.   On: 10/09/2017 00:26    Medications:  Prior to Admission:  Prescriptions Prior to Admission  Medication Sig Dispense Refill Last  Dose  . albuterol (PROVENTIL HFA;VENTOLIN HFA) 108 (90 Base) MCG/ACT inhaler Inhale 1-2 puffs into the lungs every 4 (four) hours as needed for wheezing or shortness of breath. 1 Inhaler 0 Past Week at Unknown time  . albuterol (PROVENTIL) (2.5 MG/3ML) 0.083% nebulizer solution Take 3 mLs (2.5 mg total) by nebulization every 4 (four) hours as needed. For shortness of breath 75 mL 0 unknown at unknown  . ALPRAZolam (XANAX) 0.5 MG tablet Take 0.5 mg by mouth 3 (three) times daily as needed for anxiety. For anxiety   Past Week at Unknown time  . budesonide-formoterol (SYMBICORT) 160-4.5 MCG/ACT inhaler Inhale 1 puff into the lungs 2 (two) times daily.    Past Week at Unknown time  . cyclobenzaprine (FLEXERIL) 10 MG tablet Take 10 mg by mouth 3 (three) times daily as needed for muscle spasms. For muscle spasm   Past Week at Unknown time  . gabapentin (NEURONTIN) 300 MG capsule Take 300 mg by mouth 3 (three) times daily.   Past Week at Unknown time  . metFORMIN (GLUCOPHAGE-XR) 500 MG 24 hr tablet Take 500 mg by mouth every evening. With supper.   Past Week at Unknown time  . naproxen (NAPROSYN) 500 MG tablet Take 1 tablet (500 mg total) by mouth 2 (two) times daily with a meal. 30 tablet 0 unknown at unknown  . Oxycodone HCl 10 MG TABS Take 10 mg by mouth 4 (four) times daily as needed for pain.   Past Week at Unknown  time  . ranitidine (ZANTAC) 150 MG tablet Take 150 mg by mouth 2 (two) times daily.   Past Week at Unknown time  . metoprolol (LOPRESSOR) 100 MG tablet Take 100 mg by mouth 2 (two) times daily.   10/05/2017 at unknown  . tiotropium (SPIRIVA) 18 MCG inhalation capsule Place 18 mcg into inhaler and inhale daily.   08/19/2017 at Unknown time   Scheduled: . chlorhexidine  15 mL Mouth Rinse BID  . enoxaparin (LOVENOX) injection  40 mg Subcutaneous Q24H  . insulin aspart  0-9 Units Subcutaneous TID WC  . ipratropium-albuterol  3 mL Nebulization QID  . mouth rinse  15 mL Mouth Rinse q12n4p  . methylPREDNISolone (SOLU-MEDROL) injection  40 mg Intravenous Q12H   Continuous: . sodium chloride 1 mL (10/09/17 1516)  . piperacillin-tazobactam (ZOSYN)  IV Stopped (10/10/17 0330)   QMV:[id number], haloperidol **OR** haloperidol lactate, hydrALAZINE, ipratropium-albuterol, metoprolol tartrate, nicotine, ondansetron **OR** ondansetron (ZOFRAN) IV  Assesment: She was admitted with sepsis from probably combination of urinary tract infection and pneumonia.  She has extensive pneumonia on chest CT.  At baseline she has COPD and chronic hypoxic respiratory failure.  She has been agitated and combative.  She has a history of chronic pain syndrome tobacco use, chronic anxiety and cocaine abuse so she may be having some withdrawal.  Having to be treated for her combativeness makes her high risk to have to be intubated and placed on mechanical ventilation.  At this point she looks stable and does not need that Principal Problem:   Sepsis secondary to UTI [facility]) Active Problems:   Essential hypertension   COPD exacerbation (HCC)   GAD (generalized anxiety disorder)   Chronic pain syndrome   Tobacco use disorder   Chronic respiratory failure (HCC)   Diabetes mellitus (HCC)   Tachycardia   Cocaine abuse ([place])    Plan: Continue close observation continue antibiotics and steroids etc.    LOS: 3 days    Tammy Blair 10/10/2017, 7:40  AM

## 2017-10-10 NOTE — Progress Notes (Signed)
Patient not using BIPAP at this time. No unit in room.

## 2017-10-10 NOTE — Progress Notes (Signed)
Inpatient Diabetes Program Recommendations  AACE/ADA: New Consensus Statement on Inpatient Glycemic Control (2015)  Target Ranges:  Prepandial:   less than 140 mg/dL      Peak postprandial:   less than 180 mg/dL (1-2 hours)      Critically ill patients:  140 - 180 mg/dL   Results for ORRJeena, Arnett (MRN [id number]) as of 10/10/2017 08:16  Ref. Range 10/09/2017 08:00 10/09/2017 11:28 10/09/2017 16:16 10/09/2017 21:16 10/10/2017 07:44  Glucose-Capillary Latest Ref Range: 65 - 99 mg/dL 172 (H) 160 (H) 207 (H) 240 (H) 191 (H)   Review of Glycemic Control  Diabetes history: DM2 Outpatient Diabetes medications: Metformin XR 500 mg QPM Current orders for Inpatient glycemic control: Novolog 0-9 units TID with meals  Inpatient Diabetes Program Recommendations: Correction (SSI): Please consider ordering Novolog 0-5 units QHS for bedtime correction scale.  NOTE: Patient is ordered Solumedrol 40 mg Q12H which is contributing to hyperglycemia.   Thanks, Barnie Alderman, RN, MSN, CDE Diabetes Coordinator Inpatient Diabetes Program [phone] (Team Pager from 8am to 5pm)

## 2017-10-10 NOTE — Plan of Care (Signed)
Problem: Safety: Goal: Ability to remain free from injury will improve Outcome: Progressing Patient has periods of impulsiveness. Family and patient advised of PRN medication use, the use of the bed alarm, and the use of the bottom side rails being up. Family stated understanding patient did not. Will continue to reiterate. Patient's belonging at bedside, bed in lowest position, call light within reach.

## 2017-10-10 NOTE — Progress Notes (Signed)
PROGRESS NOTE    Tammy Blair  YQM:[id number] DOB: [birth year] DOA: 10/07/2017 PCP: [name], MD    Brief Narrative:  66 year old female who presents with altered mental status. Patient does have significant past medical history of anxiety, asthma, chronic back pain, COPD on home oxygen, type 2 diabetes mellitus and hypertension. For the last 48 hours patient has been very somnolent to the point where she was difficult to be aroused. EMS was called and patient was brought into the hospital. On initial physical examination her temperature was 102.4, heart rate 120, respiratory rate 28, blood pressure 172/56, oxygen saturation 89 %. She was obtunded, noted to have increased work of breathing and febrile, her mucous membranes were dry, her lungs had decreased breath sounds at bases with bilateral wheezing and rhonchi, heart S1-S2 present irregular, tachycardic, no gallops, rubs or murmurs, the abdomen was soft nontender, no lower extremity edema. Arterial blood gas 7.25/53.7/72.6/21.0/92% on 3 L nasal cannula. Sodium 136, potassium 4.2, chloride 99, bicarbonate 24, glucose 256, BUN 42, creatinine 1.45, anion gap 13, white count 11.3, hemoglobin 14.6, hematocrit 42.8, platelets 214, venous lactic acid 1.6. Urinalysis with too numerous to count white cells. Urine drug screen positive for benzodiazepines and cocaine. Alcohol level less than 10. Head CT negative for acute findings. Chest x-ray with right rotation, opacity, well-defined borders at the lateral aspect of the left upper lobe, no air bronchogram. EKG sinus rhythm, 120 bpm, premature atrial complexes, poor R-wave progression.   Patient was admitted to the hospital with working diagnosis of sepsis due to urinary tract infection, complicated by metabolic encephalopathy respiratory failure.  Patient with persistent fever, further workup with chest CT noted large pneumonic infiltrate on the left upper lobe.    Assessment & Plan:   Principal  Problem:   Sepsis secondary to UTI [facility]) Active Problems:   Essential hypertension   COPD exacerbation (HCC)   GAD (generalized anxiety disorder)   Chronic pain syndrome   Tobacco use disorder   Chronic respiratory failure (HCC)   Diabetes mellitus (HCC)   Tachycardia   Cocaine abuse ([place])   1. Sepsis due to aspiration pneumonia on the left upper lobe, present on admission. Patient responding well to antibiotic therapy with IV Zosyn, she has remained afebrile and hemodynamically stable. Cultures continue to be no growth, wbc at 13 from 10. Continue pureed diet, will ask for a formal swallow evaluation per speech therapy, now that mentation has improved.   2. Hypoxic and hypercapnic respiratory failure, acute on chronic. Tolerating well  [place] at 3,5 Liters with oxymetry saturation at 98%. Will use bipap as needed during the day and continuous at night. Will try nasal interface to improve compliance.   3. Metabolic encephalopathy with delirium. Patient more awake and alert, no lack of attention or disorganized thinking. Will resume home alprazolam, gabapentin, flexeril and oxycodone at reduced doses. Will continue neuro checks q 4 hours and aspiration precautions. Will continue as needed haldol, patient's family at bedside. Note patient has decreased hearing.   4. Acute kidney injury. Stable renal function with serum cr at 0.58, with serum bicarbonate at 29 and serum K at 3,5. Improved po intake, will hold on IV fluids, will replete K with 40 meq kcl and will follow renal panel in am, avoid hypotension or nephrotoxic medications.   4. COPD with exacerbation. Continue bronchodilator therapy with duoneb, oxymetry monitoring, systemic steroids and supplemental 02 per [employer]. Target 02 saturation greater than 88%.  5. Hypertension. Will resume metoprolol  at reduced dose of 50 mg po bid, will continue blood pressure monitoring, will hold on further IV fluids. Continue as needed IV hydralazine.   6.  Type 2 diabetes mellitus. Capillary glucose 160, 207, 240, 191, 195. Continue sliding scale for glucose cover and monitoring, improved oral intake. At home patient on metformin.    7. Urine infection, present on admission. Responding well to IV Zosyn.   DVT prophylaxis:enoxaparin Code Status:full Family Communication:I spoke with patient proxy of care at the bedside and all questions were addressed Disposition Plan:SNF   Consultants:    Procedures:    Antimicrobials:   Zosyn   Subjective: Patient's mentation has been improving, with episodes of agitation, early this am required haldol. Reports improved dyspnea and no chest pain, positive cough. She has decreased hearing.  Objective: Vitals:   10/10/17 0300 10/10/17 0400 10/10/17 0500 10/10/17 0600  BP: 134/63 (!) 142/58 (!) 138/96 140/61  Pulse: 67 70 (!) 56 76  Resp: 18 (!) 23 19 (!) 21  Temp: 98.4 F (36.9 C) 98.2 F (36.8 C) 97.9 F (36.6 C) 97.7 F (36.5 C)  TempSrc:      SpO2: 100% 97% 99% 96%  Weight:   91.2 kg (201 lb 1 oz)     Intake/Output Summary (Last 24 hours) at 10/10/17 0728 Last data filed at 10/10/17 0600  Gross per 24 hour  Intake          2133.33 ml  Output             1350 ml  Net           783.33 ml   Filed Weights   10/08/17 0453 10/09/17 0456 10/10/17 0500  Weight: 92.4 kg (203 lb 11.3 oz) 90.6 kg (199 lb 11.8 oz) 91.2 kg (201 lb 1 oz)    Examination:   General: deconditioned Neurology: Awake and alert, non focal, orientated and following commands, no lack of attention or disorganized thinking.   E ENT: mild pallor, no icterus, oral mucosa moist Cardiovascular: No JVD. S1-S2 present, rhythmic, no gallops, rubs, or murmurs. No lower extremity edema. Pulmonary: decreased breath sounds bilaterally, improved air movement, no wheezing, rhonchi, scattered rales. Gastrointestinal. Abdomen protuberant, no organomegaly, non tender, no rebound or guarding Skin. No  rashes Musculoskeletal: no joint deformities     Data Reviewed: I have personally reviewed following labs and imaging studies  CBC:  Recent Labs Lab 10/07/17 0310 10/08/17 0423 10/09/17 0423 10/10/17 0514  WBC 11.3* 12.6* 10.0 13.1*  NEUTROABS 9.1* 10.9* 8.0* 11.4*  HGB 14.6 12.1 12.9 11.6*  HCT 42.8 37.3 38.5 35.9*  MCV 93.9 94.7 95.1 94.5  PLT 214 226 PLATELET CLUMPS NOTED ON SMEAR, COUNT APPEARS ADEQUATE 465   Basic Metabolic Panel:  Recent Labs Lab 10/07/17 0310 10/07/17 1222 10/08/17 0423 10/09/17 0423 10/10/17 0514  NA 136 137 136 139 137  K 4.2 4.0 3.6 3.6 3.5  CL 99* 105 103 102 102  CO2 24 25 26 27 29   GLUCOSE 256* 283* 261* 154* 206*  BUN 42* 33* 23* 16 22*  CREATININE 1.45* 1.03* 0.78 0.73 0.58  CALCIUM 9.1 8.4* 8.6* 9.1 8.6*  MG 2.4  --   --   --   --   PHOS 3.5  --   --   --   --    GFR: Estimated Creatinine Clearance: 85.5 mL/min (by C-G formula based on SCr of 0.58 mg/dL). Liver Function Tests:  Recent Labs Lab 10/07/17 0310 10/08/17 0423  AST 73* 68*  ALT 38 35  ALKPHOS 75 65  BILITOT 0.6 0.3  PROT 8.5* 6.7  ALBUMIN 3.1* 2.6*   No results for input(s): LIPASE, AMYLASE in the last 168 hours. No results for input(s): AMMONIA in the last 168 hours. Coagulation Profile: No results for input(s): INR, PROTIME in the last 168 hours. Cardiac Enzymes:  Recent Labs Lab 10/07/17 0310  TROPONINI <0.03   BNP (last 3 results) No results for input(s): PROBNP in the last 8760 hours. HbA1C: No results for input(s): HGBA1C in the last 72 hours. CBG:  Recent Labs Lab 10/08/17 2336 10/09/17 0800 10/09/17 1128 10/09/17 1616 10/09/17 2116  GLUCAP 159* 172* 160* 207* 240*   Lipid Profile: No results for input(s): CHOL, HDL, LDLCALC, TRIG, CHOLHDL, LDLDIRECT in the last 72 hours. Thyroid Function Tests: No results for input(s): TSH, T4TOTAL, FREET4, T3FREE, THYROIDAB in the last 72 hours. Anemia Panel: No results for input(s):  VITAMINB12, FOLATE, FERRITIN, TIBC, IRON, RETICCTPCT in the last 72 hours.    Radiology Studies: I have reviewed all of the imaging during this hospital visit personally     Scheduled Meds: . chlorhexidine  15 mL Mouth Rinse BID  . enoxaparin (LOVENOX) injection  40 mg Subcutaneous Q24H  . insulin aspart  0-9 Units Subcutaneous TID WC  . ipratropium-albuterol  3 mL Nebulization QID  . mouth rinse  15 mL Mouth Rinse q12n4p  . methylPREDNISolone (SOLU-MEDROL) injection  40 mg Intravenous Q12H   Continuous Infusions: . sodium chloride 1 mL (10/09/17 1516)  . piperacillin-tazobactam (ZOSYN)  IV Stopped (10/10/17 0330)     LOS: 3 days        Mauricio Gerome Apley, MD [facility] Pager [phone]

## 2017-10-11 DIAGNOSIS — J9611 Chronic respiratory failure with hypoxia: Secondary | ICD-10-CM

## 2017-10-11 DIAGNOSIS — I10 Essential (primary) hypertension: Secondary | ICD-10-CM

## 2017-10-11 DIAGNOSIS — F411 Generalized anxiety disorder: Secondary | ICD-10-CM

## 2017-10-11 DIAGNOSIS — J9612 Chronic respiratory failure with hypercapnia: Secondary | ICD-10-CM

## 2017-10-11 DIAGNOSIS — J441 Chronic obstructive pulmonary disease with (acute) exacerbation: Secondary | ICD-10-CM

## 2017-10-11 DIAGNOSIS — G894 Chronic pain syndrome: Secondary | ICD-10-CM

## 2017-10-11 DIAGNOSIS — N390 Urinary tract infection, site not specified: Secondary | ICD-10-CM

## 2017-10-11 DIAGNOSIS — F1410 Cocaine abuse, uncomplicated: Secondary | ICD-10-CM

## 2017-10-11 DIAGNOSIS — E119 Type 2 diabetes mellitus without complications: Secondary | ICD-10-CM

## 2017-10-11 DIAGNOSIS — A419 Sepsis, unspecified organism: Principal | ICD-10-CM

## 2017-10-11 LAB — CBC WITH DIFFERENTIAL/PLATELET
Basophils Absolute: 0 10*3/uL (ref 0.0–0.1)
Basophils Relative: 0 %
EOS ABS: 0 10*3/uL (ref 0.0–0.7)
Eosinophils Relative: 0 %
HEMATOCRIT: 35.4 % — AB (ref 36.0–46.0)
HEMOGLOBIN: 11.6 g/dL — AB (ref 12.0–15.0)
LYMPHS ABS: 1.1 10*3/uL (ref 0.7–4.0)
Lymphocytes Relative: 8 %
MCH: 30.7 pg (ref 26.0–34.0)
MCHC: 32.8 g/dL (ref 30.0–36.0)
MCV: 93.7 fL (ref 78.0–100.0)
MONOS PCT: 4 %
Monocytes Absolute: 0.5 10*3/uL (ref 0.1–1.0)
Neutro Abs: 11.6 10*3/uL — ABNORMAL HIGH (ref 1.7–7.7)
Neutrophils Relative %: 88 %
Platelets: 259 10*3/uL (ref 150–400)
RBC: 3.78 MIL/uL — ABNORMAL LOW (ref 3.87–5.11)
RDW: 13.8 % (ref 11.5–15.5)
WBC: 13.2 10*3/uL — AB (ref 4.0–10.5)

## 2017-10-11 LAB — GLUCOSE, CAPILLARY
GLUCOSE-CAPILLARY: 247 mg/dL — AB (ref 65–99)
GLUCOSE-CAPILLARY: 266 mg/dL — AB (ref 65–99)
GLUCOSE-CAPILLARY: 252 mg/dL — AB (ref 65–99)
Glucose-Capillary: 293 mg/dL — ABNORMAL HIGH (ref 65–99)
Glucose-Capillary: 220 mg/dL — ABNORMAL HIGH (ref 65–99)

## 2017-10-11 LAB — MAGNESIUM: MAGNESIUM: 2.1 mg/dL (ref 1.7–2.4)

## 2017-10-11 LAB — BASIC METABOLIC PANEL
Anion gap: 7 (ref 5–15)
BUN: 22 mg/dL — ABNORMAL HIGH (ref 6–20)
CALCIUM: 8.4 mg/dL — AB (ref 8.9–10.3)
CHLORIDE: 103 mmol/L (ref 101–111)
CO2: 28 mmol/L (ref 22–32)
CREATININE: 0.58 mg/dL (ref 0.44–1.00)
GFR calc non Af Amer: >60 mL/min (ref >60)
GLUCOSE: 285 mg/dL — AB (ref 65–99)
Potassium: 4.2 mmol/L (ref 3.5–5.1)
Sodium: 138 mmol/L (ref 135–145)

## 2017-10-11 MED ORDER — AMOXICILLIN-POT CLAVULANATE 875-125 MG PO TABS
1.0000 | ORAL_TABLET | Freq: Two times a day (BID) | ORAL | Status: DC
  Administered 2017-10-11 – 2017-10-12 (×3): 1 via ORAL
  Filled 2017-10-11 (×3): qty 1

## 2017-10-11 MED ORDER — PREDNISONE 20 MG PO TABS
40.0000 mg | ORAL_TABLET | Freq: Every day | ORAL | Status: DC
  Administered 2017-10-12: 40 mg via ORAL
  Filled 2017-10-11: qty 2

## 2017-10-11 NOTE — Progress Notes (Signed)
PROGRESS NOTE    Tammy Blair  KGU:[id number] DOB: [birth year] DOA: 10/07/2017 PCP: [name], MD    Brief Narrative:  66 year old female who was brought to the hospital and acute encephalopathy.  She has been somnolent for approximately 48 hours prior to admission.  She was noted to be febrile, tachycardic on initial evaluation and also was noted to have an oxygen saturation of 89%.  She had bilateral wheezing.  Urine drug screen was positive for benzodiazepines and cocaine.  She was admitted to the hospital for treatment of sepsis and was found to have left upper lobe pneumonia.  She was started on intravenous antibiotics, steroids.  Pulmonology has followed her during her hospital course.  Patient is clinically improving.   Assessment & Plan:   Principal Problem:   Sepsis secondary to UTI [facility]) Active Problems:   Essential hypertension   COPD exacerbation (HCC)   GAD (generalized anxiety disorder)   Chronic pain syndrome   Tobacco use disorder   Chronic respiratory failure (HCC)   Diabetes mellitus (HCC)   Tachycardia   Cocaine abuse ([place])   1. Sepsis due to aspiration pneumonia in the left upper lobe.  Present on admission.  Patient was treated with intravenous Zosyn and has significantly improved.  She is now afebrile.  Blood cultures and urine culture have shown no growth.  We will transition antibiotics to Augmentin.  She was seen by speech therapy is started on a mechanical soft diet. 2. Acute on chronic hypoxic and hypercapnic respiratory failure.  Overall respiratory status has improved with treatment.  This morning, she is on room air and appears to be breathing comfortably.  We will continue to monitor. 3. Metabolic encephalopathy with delirium.  She is on several medications including Xanax, gabapentin, Flexeril and oxycodone.  These medications have been resumed.  Mental status appears to be approaching baseline.  Decline in mental status likely related to  sepsis/dehydration 4. Acute kidney injury.  She initially presented with a creatinine of 1.45.  This is improved to 0.5 with hydration and is back to baseline.  Continue to monitor. 5. COPD exacerbation.  Overall breathing and wheezing has improved.  She is on IV steroids and bronchodilators.  Will transition Solu-Medrol to prednisone. 6. Diabetes.  Blood sugars currently stable.  Continue on sliding scale insulin. 7. Substance abuse.  Patient was found to be cocaine positive. 8. Anxiety.  Continue on Xanax.   DVT prophylaxis: Lovenox Code Status: Full code Family Communication: Discussed with family at the bedside Disposition Plan: Discharge home, possibly in a.m. if she continues to improve   Consultants:     Procedures:     Antimicrobials:   Zosyn 10/29 > 10/31  Augmentin 10/31 >   Subjective: She does not report any significant cough.  Overall shortness of breath is improving.  Objective: Vitals:   10/11/17 0800 10/11/17 0807 10/11/17 0900 10/11/17 1000  BP: (!) 170/77  (!) 164/82 (!) 150/72  Pulse: 71  78 75  Resp: 17  (!) 24   Temp: (!) 97.2 F (36.2 C)  (!) 96.8 F (36 C) (!) 97.2 F (36.2 C)  TempSrc:   Core (Comment)   SpO2: 100% 100% 99% 99%  Weight:   93.5 kg (206 lb 2.1 oz)   Height:   5\' 5"  (1.651 m)     Intake/Output Summary (Last 24 hours) at 10/11/17 1045 Last data filed at 10/11/17 0900  Gross per 24 hour  Intake  200 ml  Output             1200 ml  Net            -1000 ml   Filed Weights   10/10/17 0500 10/11/17 0500 10/11/17 0900  Weight: 91.2 kg (201 lb 1 oz) 93.5 kg (206 lb 2.1 oz) 93.5 kg (206 lb 2.1 oz)    Examination:  General exam: Appears calm and comfortable  Respiratory system: Clear to auscultation. Respiratory effort normal. Cardiovascular system: S1 & S2 heard, RRR. No JVD, murmurs, rubs, gallops or clicks. No pedal edema. Gastrointestinal system: Abdomen is nondistended, soft and nontender. No organomegaly or  masses felt. Normal bowel sounds heard. Central nervous system: Alert and oriented. No focal neurological deficits. Extremities: Symmetric 5 x 5 power. Skin: No rashes, lesions or ulcers Psychiatry: Judgement and insight appear normal. Mood & affect appropriate.     Data Reviewed: I have personally reviewed following labs and imaging studies  CBC:  Recent Labs Lab 10/07/17 0310 10/08/17 0423 10/09/17 0423 10/10/17 0514 10/11/17 0539  WBC 11.3* 12.6* 10.0 13.1* 13.2*  NEUTROABS 9.1* 10.9* 8.0* 11.4* 11.6*  HGB 14.6 12.1 12.9 11.6* 11.6*  HCT 42.8 37.3 38.5 35.9* 35.4*  MCV 93.9 94.7 95.1 94.5 93.7  PLT 214 226 PLATELET CLUMPS NOTED ON SMEAR, COUNT APPEARS ADEQUATE 242 846   Basic Metabolic Panel:  Recent Labs Lab 10/07/17 0310 10/07/17 1222 10/08/17 0423 10/09/17 0423 10/10/17 0514 10/11/17 0539  NA 136 137 136 139 137 138  K 4.2 4.0 3.6 3.6 3.5 4.2  CL 99* 105 103 102 102 103  CO2 24 25 26 27 29 28   GLUCOSE 256* 283* 261* 154* 206* 285*  BUN 42* 33* 23* 16 22* 22*  CREATININE 1.45* 1.03* 0.78 0.73 0.58 0.58  CALCIUM 9.1 8.4* 8.6* 9.1 8.6* 8.4*  MG 2.4  --   --   --   --  2.1  PHOS 3.5  --   --   --   --   --    GFR: Estimated Creatinine Clearance: 80.3 mL/min (by C-G formula based on SCr of 0.58 mg/dL). Liver Function Tests:  Recent Labs Lab 10/07/17 0310 10/08/17 0423  AST 73* 68*  ALT 38 35  ALKPHOS 75 65  BILITOT 0.6 0.3  PROT 8.5* 6.7  ALBUMIN 3.1* 2.6*   No results for input(s): LIPASE, AMYLASE in the last 168 hours. No results for input(s): AMMONIA in the last 168 hours. Coagulation Profile: No results for input(s): INR, PROTIME in the last 168 hours. Cardiac Enzymes:  Recent Labs Lab 10/07/17 0310  TROPONINI <0.03   BNP (last 3 results) No results for input(s): PROBNP in the last 8760 hours. HbA1C: No results for input(s): HGBA1C in the last 72 hours. CBG:  Recent Labs Lab 10/10/17 0744 10/10/17 1137 10/10/17 1623  10/10/17 2316 10/11/17 0806  GLUCAP 191* 195* 262* 293* 247*   Lipid Profile: No results for input(s): CHOL, HDL, LDLCALC, TRIG, CHOLHDL, LDLDIRECT in the last 72 hours. Thyroid Function Tests: No results for input(s): TSH, T4TOTAL, FREET4, T3FREE, THYROIDAB in the last 72 hours. Anemia Panel: No results for input(s): VITAMINB12, FOLATE, FERRITIN, TIBC, IRON, RETICCTPCT in the last 72 hours. Sepsis Labs:  Recent Labs Lab 10/07/17 0310 10/07/17 0341  PROCALCITON 1.04  --   LATICACIDVEN  --  1.60    Recent Results (from the past 240 hour(s))  Urine culture     Status: None   Collection Time: 10/07/17  2:58 AM  Result Value Ref Range Status   Specimen Description URINE, CATHETERIZED  Final   Special Requests NONE  Final   Culture   Final    NO GROWTH Performed at [facility], [address] [place], [place] [address]    Report Status 10/08/2017 FINAL  Final  Blood culture (routine x 2)     Status: None (Preliminary result)   Collection Time: 10/07/17  3:11 AM  Result Value Ref Range Status   Specimen Description RIGHT ANTECUBITAL  Final   Special Requests   Final    BOTTLES DRAWN AEROBIC AND ANAEROBIC Blood Culture adequate volume   Culture NO GROWTH 4 DAYS  Final   Report Status PENDING  Incomplete  Blood culture (routine x 2)     Status: None (Preliminary result)   Collection Time: 10/07/17  3:30 AM  Result Value Ref Range Status   Specimen Description BLOOD RIGHT HAND  Final   Special Requests   Final    BOTTLES DRAWN AEROBIC ONLY Blood Culture results may not be optimal due to an inadequate volume of blood received in culture bottles   Culture NO GROWTH 4 DAYS  Final   Report Status PENDING  Incomplete  MRSA PCR Screening     Status: None   Collection Time: 10/07/17  6:14 AM  Result Value Ref Range Status   MRSA by PCR NEGATIVE NEGATIVE Final    Comment:        The GeneXpert MRSA Assay (FDA approved for NASAL specimens only), is one component of  a comprehensive MRSA colonization surveillance program. It is not intended to diagnose MRSA infection nor to guide or monitor treatment for MRSA infections.   Culture, blood (routine x 2)     Status: None (Preliminary result)   Collection Time: 10/08/17  9:24 PM  Result Value Ref Range Status   Specimen Description BLOOD RIGHT ARM  Final   Special Requests   Final    BOTTLES DRAWN AEROBIC ONLY Blood Culture adequate volume   Culture NO GROWTH 3 DAYS  Final   Report Status PENDING  Incomplete  Culture, blood (Routine X 2) w Reflex to ID Panel     Status: None (Preliminary result)   Collection Time: 10/09/17  4:23 AM  Result Value Ref Range Status   Specimen Description RIGHT ANTECUBITAL  Final   Special Requests   Final    BOTTLES DRAWN AEROBIC AND ANAEROBIC Blood Culture adequate volume   Culture NO GROWTH 2 DAYS  Final   Report Status PENDING  Incomplete         Radiology Studies: No results found.      Scheduled Meds: . amoxicillin-clavulanate  1 tablet Oral Q12H  . chlorhexidine  15 mL Mouth Rinse BID  . enoxaparin (LOVENOX) injection  40 mg Subcutaneous Q24H  . gabapentin  300 mg Oral TID  . insulin aspart  0-9 Units Subcutaneous TID WC  . ipratropium-albuterol  3 mL Nebulization TID  . mouth rinse  15 mL Mouth Rinse q12n4p  . metoprolol tartrate  50 mg Oral BID  . mometasone-formoterol  2 puff Inhalation BID  . [START ON 10/12/2017] predniSONE  40 mg Oral Q breakfast   Continuous Infusions:   LOS: 4 days    Time spent: 10mins    Zayyan Mullen, MD [facility] Pager [phone]  If 7PM-7AM, please contact night-coverage [email or web address] Password [email or web address] 10/11/2017, 10:45 AM

## 2017-10-11 NOTE — Care Management Note (Signed)
Case Management Note  Patient Details  Name: Tammy Blair MRN: [id number] Date of Birth: Jan 10, 1951  Subjective/Objective:  Adm with sepsis secondary to UTI. From home with daughter. Has RW and oxygen at home provided by [facility]. Family at bedside. Has PCP, son drives her to appointments. Recommended for [facility] PT. Patient declines at this time.                Action/Plan: Patient declining HH PT at this time. Will f/u again prior to DC. Also recommended for supervision/24 hour. Patient reports daughter is with her at home.    Expected Discharge Date:                  Expected Discharge Plan:     In-House Referral:     Discharge planning Services  CM Consult  Post Acute Care Choice:  Home Health Choice offered to:     DME Arranged:    DME Agency:     HH Arranged:    HH Agency:     Status of Service:  In process, will continue to follow  If discussed at Long Length of Stay Meetings, dates discussed:    Additional Comments:  Taahir Grisby, Chauncey Reading, RN 10/11/2017, 11:58 AM

## 2017-10-11 NOTE — Progress Notes (Signed)
Physical Therapy Treatment Patient Details Name: Tammy Blair MRN: [id number] DOB: 05-25-1951 Today's Date: 10/11/2017    History of Present Illness Tammy Blair is a 66 y.o. female with medical history significant of anxiety, asthma, chronic back pain, lumbar radiculopathy, COPD on home oxygen, type 2 diabetes, impaired hearing, hypertension who is being brought via EMS to the emergency department due to altered mental status.    PT Comments    Patient demonstrates increased tolerance for gait training, limited secondary to c/o SOB and tends to become more unsteady having to sit down.  Patient's O2 sats at 90% after walking on room air, put back on O2, sats increased to 97%.  Patient will benefit from continued physical therapy in hospital and recommended venue below to increase strength, balance, endurance for safe ADLs and gait.    Follow Up Recommendations  Home health PT;Supervision/Assistance - 24 hour     Equipment Recommendations  None recommended by PT    Recommendations for Other Services       Precautions / Restrictions Precautions Precautions: Fall Restrictions Weight Bearing Restrictions: No    Mobility  Bed Mobility Overal bed mobility: Needs Assistance Bed Mobility: Supine to Sit;Sit to Supine     Supine to sit: Min assist Sit to supine: Min guard      Transfers Overall transfer level: Needs assistance Equipment used: Rolling walker (2 wheeled) Transfers: Sit to/from [facility] Sit to Stand: Min assist Stand pivot transfers: Min assist          Ambulation/Gait Ambulation/Gait assistance: Min assist Ambulation Distance (Feet): 30 Feet Assistive device: Rolling walker (2 wheeled) Gait Pattern/deviations: Decreased step length - right;Decreased step length - left;Decreased stride length   Gait velocity interpretation: Below normal speed for age/gender General Gait Details: demonstrates unsteady labored cadence without loss of  balance, limited secondary to c/o SOB while on room air   Stairs            Wheelchair Mobility    Modified Rankin (Stroke Patients Only)       Balance Overall balance assessment: Needs assistance Sitting-balance support: Feet supported;No upper extremity supported Sitting balance-Leahy Scale: Good     Standing balance support: Bilateral upper extremity supported;During functional activity Standing balance-Leahy Scale: Fair                              Cognition Arousal/Alertness: Awake/alert Behavior During Therapy: WFL for tasks assessed/performed Overall Cognitive Status: Within Functional Limits for tasks assessed                                        Exercises General Exercises - Lower Extremity Long Arc Quad: Seated;AROM;Strengthening;Both;10 reps Hip Flexion/Marching: Seated;AROM;Strengthening;Both;10 reps    General Comments        Pertinent Vitals/Pain Pain Assessment: No/denies pain    Home Living                      Prior Function            PT Goals (current goals can now be found in the care plan section) Acute Rehab PT Goals Patient Stated Goal: return home with family to assist PT Goal Formulation: With patient Time For Goal Achievement: 10/17/17 Potential to Achieve Goals: Good Progress towards PT goals: Progressing toward goals    Frequency  Min 3X/week      PT Plan Current plan remains appropriate    Co-evaluation              AM-PAC PT "6 Clicks" Daily Activity  Outcome Measure  Difficulty turning over in bed (including adjusting bedclothes, sheets and blankets)?: None Difficulty moving from lying on back to sitting on the side of the bed? : A Little Difficulty sitting down on and standing up from a chair with arms (e.g., wheelchair, bedside commode, etc,.)?: A Little Help needed moving to and from a bed to chair (including a wheelchair)?: A Little Help needed walking in  hospital room?: A Little Help needed climbing 3-5 steps with a railing? : A Lot 6 Click Score: 18    End of Session Equipment Utilized During Treatment: Gait belt Activity Tolerance: Patient tolerated treatment well (limited secondary to SOB) Patient left: in chair;with call bell/phone within reach;with family/visitor present Nurse Communication: Mobility status PT Visit Diagnosis: Unsteadiness on feet (R26.81);Other abnormalities of gait and mobility (R26.89);Muscle weakness (generalized) (M62.81)     Time: 9390-3009 PT Time Calculation (min) (ACUTE ONLY): 28 min  Charges:  $Neuromuscular Re-education: 23-37 mins                    G Codes:       3:52 PM, 10-29-2017 Lonell Grandchild, MPT Physical Therapist with [facility] [phone] office [phone] mobile phone

## 2017-10-11 NOTE — Progress Notes (Signed)
Subjective: She is much improved.  Much less confusion.  She is sleepy again this morning but arousable.  She has no complaints  Objective: Vital signs in last 24 hours: Temp:  [96.8 F (36 C)-97.7 F (36.5 C)] 97 F (36.1 C) (10/31 0700) Pulse Rate:  [50-75] 60 (10/31 0700) Resp:  [14-24] 19 (10/31 0700) BP: (123-174)/(55-94) 167/64 (10/31 0700) SpO2:  [93 %-100 %] 100 % (10/31 0700) Weight:  [93.5 kg (206 lb 2.1 oz)] 93.5 kg (206 lb 2.1 oz) (10/31 0500) Weight change: 2.3 kg (5 lb 1.1 oz) Last BM Date: 10/11/17  Intake/Output from previous day: 10/30 0701 - 10/31 0700 In: 50 [IV Piggyback:50] Out: 1200 [Urine:1200]  PHYSICAL EXAM General appearance: alert, cooperative and no distress Resp: rhonchi bilaterally Cardio: regular rate and rhythm, S1, S2 normal, no murmur, click, rub or gallop GI: soft, non-tender; bowel sounds normal; no masses,  no organomegaly Extremities: extremities normal, atraumatic, no cyanosis or edema Hard of hearing  Lab Results:  Results for orders placed or performed during the hospital encounter of 10/07/17 (from the past 48 hour(s))  Glucose, capillary     Status: Abnormal   Collection Time: 10/09/17 11:28 AM  Result Value Ref Range   Glucose-Capillary 160 (H) 65 - 99 mg/dL  Glucose, capillary     Status: Abnormal   Collection Time: 10/09/17  4:16 PM  Result Value Ref Range   Glucose-Capillary 207 (H) 65 - 99 mg/dL  Glucose, capillary     Status: Abnormal   Collection Time: 10/09/17  9:16 PM  Result Value Ref Range   Glucose-Capillary 240 (H) 65 - 99 mg/dL  CBC with Differential/Platelet     Status: Abnormal   Collection Time: 10/10/17  5:14 AM  Result Value Ref Range   WBC 13.1 (H) 4.0 - 10.5 K/uL   RBC 3.80 (L) 3.87 - 5.11 MIL/uL   Hemoglobin 11.6 (L) 12.0 - 15.0 g/dL   HCT 35.9 (L) 36.0 - 46.0 %   MCV 94.5 78.0 - 100.0 fL   MCH 30.5 26.0 - 34.0 pg   MCHC 32.3 30.0 - 36.0 g/dL   RDW 13.7 11.5 - 15.5 %   Platelets 242 150 - 400 K/uL    Neutrophils Relative % 88 %   Neutro Abs 11.4 (H) 1.7 - 7.7 K/uL   Lymphocytes Relative 8 %   Lymphs Abs 1.1 0.7 - 4.0 K/uL   Monocytes Relative 4 %   Monocytes Absolute 0.6 0.1 - 1.0 K/uL   Eosinophils Relative 0 %   Eosinophils Absolute 0.0 0.0 - 0.7 K/uL   Basophils Relative 0 %   Basophils Absolute 0.1 0.0 - 0.1 K/uL  Basic metabolic panel     Status: Abnormal   Collection Time: 10/10/17  5:14 AM  Result Value Ref Range   Sodium 137 135 - 145 mmol/L   Potassium 3.5 3.5 - 5.1 mmol/L   Chloride 102 101 - 111 mmol/L   CO2 29 22 - 32 mmol/L   Glucose, Bld 206 (H) 65 - 99 mg/dL   BUN 22 (H) 6 - 20 mg/dL   Creatinine, Ser 0.58 0.44 - 1.00 mg/dL   Calcium 8.6 (L) 8.9 - 10.3 mg/dL   GFR calc non Af Amer >60 >60 mL/min   GFR calc Af Amer >60 >60 mL/min    Comment: (NOTE) The eGFR has been calculated using the CKD EPI equation. This calculation has not been validated in all clinical situations. eGFR's persistently <60 mL/min signify possible Chronic  Kidney Disease.    Anion gap 6 5 - 15  Glucose, capillary     Status: Abnormal   Collection Time: 10/10/17  7:44 AM  Result Value Ref Range   Glucose-Capillary 191 (H) 65 - 99 mg/dL  Glucose, capillary     Status: Abnormal   Collection Time: 10/10/17 11:37 AM  Result Value Ref Range   Glucose-Capillary 195 (H) 65 - 99 mg/dL  Glucose, capillary     Status: Abnormal   Collection Time: 10/10/17  4:23 PM  Result Value Ref Range   Glucose-Capillary 262 (H) 65 - 99 mg/dL  Glucose, capillary     Status: Abnormal   Collection Time: 10/10/17 11:16 PM  Result Value Ref Range   Glucose-Capillary 293 (H) 65 - 99 mg/dL  CBC with Differential/Platelet     Status: Abnormal   Collection Time: 10/11/17  5:39 AM  Result Value Ref Range   WBC 13.2 (H) 4.0 - 10.5 K/uL   RBC 3.78 (L) 3.87 - 5.11 MIL/uL   Hemoglobin 11.6 (L) 12.0 - 15.0 g/dL   HCT 35.4 (L) 36.0 - 46.0 %   MCV 93.7 78.0 - 100.0 fL   MCH 30.7 26.0 - 34.0 pg   MCHC 32.8 30.0 -  36.0 g/dL   RDW 13.8 11.5 - 15.5 %   Platelets 259 150 - 400 K/uL   Neutrophils Relative % 88 %   Lymphocytes Relative 8 %   Monocytes Relative 4 %   Eosinophils Relative 0 %   Basophils Relative 0 %   Neutro Abs 11.6 (H) 1.7 - 7.7 K/uL   Lymphs Abs 1.1 0.7 - 4.0 K/uL   Monocytes Absolute 0.5 0.1 - 1.0 K/uL   Eosinophils Absolute 0.0 0.0 - 0.7 K/uL   Basophils Absolute 0.0 0.0 - 0.1 K/uL   WBC Morphology TOXIC GRANULATION     Comment: ATYPICAL LYMPHOCYTES  Basic metabolic panel     Status: Abnormal   Collection Time: 10/11/17  5:39 AM  Result Value Ref Range   Sodium 138 135 - 145 mmol/L   Potassium 4.2 3.5 - 5.1 mmol/L   Chloride 103 101 - 111 mmol/L   CO2 28 22 - 32 mmol/L   Glucose, Bld 285 (H) 65 - 99 mg/dL   BUN 22 (H) 6 - 20 mg/dL   Creatinine, Ser 0.58 0.44 - 1.00 mg/dL   Calcium 8.4 (L) 8.9 - 10.3 mg/dL   GFR calc non Af Amer >60 >60 mL/min   GFR calc Af Amer >60 >60 mL/min    Comment: (NOTE) The eGFR has been calculated using the CKD EPI equation. This calculation has not been validated in all clinical situations. eGFR's persistently <60 mL/min signify possible Chronic Kidney Disease.    Anion gap 7 5 - 15  Magnesium     Status: None   Collection Time: 10/11/17  5:39 AM  Result Value Ref Range   Magnesium 2.1 1.7 - 2.4 mg/dL    ABGS  Recent Labs  10/08/17 0948  PHART 7.342*  PO2ART 85.0  HCO3 24.5   CULTURES Recent Results (from the past 240 hour(s))  Urine culture     Status: None   Collection Time: 10/07/17  2:58 AM  Result Value Ref Range Status   Specimen Description URINE, CATHETERIZED  Final   Special Requests NONE  Final   Culture   Final    NO GROWTH Performed at [facility], [address] [place], [place] [address]  Report Status 10/08/2017 FINAL  Final  Blood culture (routine x 2)     Status: None (Preliminary result)   Collection Time: 10/07/17  3:11 AM  Result Value Ref Range Status   Specimen Description RIGHT  ANTECUBITAL  Final   Special Requests   Final    BOTTLES DRAWN AEROBIC AND ANAEROBIC Blood Culture adequate volume   Culture NO GROWTH 4 DAYS  Final   Report Status PENDING  Incomplete  Blood culture (routine x 2)     Status: None (Preliminary result)   Collection Time: 10/07/17  3:30 AM  Result Value Ref Range Status   Specimen Description BLOOD RIGHT HAND  Final   Special Requests   Final    BOTTLES DRAWN AEROBIC ONLY Blood Culture results may not be optimal due to an inadequate volume of blood received in culture bottles   Culture NO GROWTH 4 DAYS  Final   Report Status PENDING  Incomplete  MRSA PCR Screening     Status: None   Collection Time: 10/07/17  6:14 AM  Result Value Ref Range Status   MRSA by PCR NEGATIVE NEGATIVE Final    Comment:        The GeneXpert MRSA Assay (FDA approved for NASAL specimens only), is one component of a comprehensive MRSA colonization surveillance program. It is not intended to diagnose MRSA infection nor to guide or monitor treatment for MRSA infections.   Culture, blood (routine x 2)     Status: None (Preliminary result)   Collection Time: 10/08/17  9:24 PM  Result Value Ref Range Status   Specimen Description BLOOD RIGHT ARM  Final   Special Requests   Final    BOTTLES DRAWN AEROBIC ONLY Blood Culture adequate volume   Culture NO GROWTH 3 DAYS  Final   Report Status PENDING  Incomplete  Culture, blood (Routine X 2) w Reflex to ID Panel     Status: None (Preliminary result)   Collection Time: 10/09/17  4:23 AM  Result Value Ref Range Status   Specimen Description RIGHT ANTECUBITAL  Final   Special Requests   Final    BOTTLES DRAWN AEROBIC AND ANAEROBIC Blood Culture adequate volume   Culture NO GROWTH 2 DAYS  Final   Report Status PENDING  Incomplete   Studies/Results: No results found.  Medications:  Prior to Admission:  Prescriptions Prior to Admission  Medication Sig Dispense Refill Last Dose  . albuterol (PROVENTIL  HFA;VENTOLIN HFA) 108 (90 Base) MCG/ACT inhaler Inhale 1-2 puffs into the lungs every 4 (four) hours as needed for wheezing or shortness of breath. 1 Inhaler 0 Past Week at Unknown time  . albuterol (PROVENTIL) (2.5 MG/3ML) 0.083% nebulizer solution Take 3 mLs (2.5 mg total) by nebulization every 4 (four) hours as needed. For shortness of breath 75 mL 0 unknown at unknown  . ALPRAZolam (XANAX) 0.5 MG tablet Take 0.5 mg by mouth 3 (three) times daily as needed for anxiety. For anxiety   Past Week at Unknown time  . budesonide-formoterol (SYMBICORT) 160-4.5 MCG/ACT inhaler Inhale 1 puff into the lungs 2 (two) times daily.    Past Week at Unknown time  . cyclobenzaprine (FLEXERIL) 10 MG tablet Take 10 mg by mouth 3 (three) times daily as needed for muscle spasms. For muscle spasm   Past Week at Unknown time  . gabapentin (NEURONTIN) 300 MG capsule Take 300 mg by mouth 3 (three) times daily.   Past Week at Unknown time  . metFORMIN (GLUCOPHAGE-XR) 500 MG  24 hr tablet Take 500 mg by mouth every evening. With supper.   Past Week at Unknown time  . naproxen (NAPROSYN) 500 MG tablet Take 1 tablet (500 mg total) by mouth 2 (two) times daily with a meal. 30 tablet 0 unknown at unknown  . Oxycodone HCl 10 MG TABS Take 10 mg by mouth 4 (four) times daily as needed for pain.   Past Week at Unknown time  . ranitidine (ZANTAC) 150 MG tablet Take 150 mg by mouth 2 (two) times daily.   Past Week at Unknown time  . metoprolol (LOPRESSOR) 100 MG tablet Take 100 mg by mouth 2 (two) times daily.   10/05/2017 at unknown  . tiotropium (SPIRIVA) 18 MCG inhalation capsule Place 18 mcg into inhaler and inhale daily.   08/19/2017 at Unknown time   Scheduled: . chlorhexidine  15 mL Mouth Rinse BID  . enoxaparin (LOVENOX) injection  40 mg Subcutaneous Q24H  . gabapentin  300 mg Oral TID  . insulin aspart  0-9 Units Subcutaneous TID WC  . ipratropium-albuterol  3 mL Nebulization TID  . mouth rinse  15 mL Mouth Rinse q12n4p  .  methylPREDNISolone (SOLU-MEDROL) injection  40 mg Intravenous Q12H  . metoprolol tartrate  50 mg Oral BID  . mometasone-formoterol  2 puff Inhalation BID   Continuous: . piperacillin-tazobactam (ZOSYN)  IV Stopped (10/11/17 0324)   [facility]:MOCAREQJEADGN, ALPRAZolam, cyclobenzaprine, haloperidol **OR** haloperidol lactate, hydrALAZINE, ipratropium-albuterol, nicotine, ondansetron **OR** ondansetron (ZOFRAN) IV, oxyCODONE  Assesment: She was admitted with sepsis from urinary tract infection and pneumonia.  She was found unresponsive at home.  Her sepsis has resolved.  She has extensive pneumonia on chest x-ray and is on appropriate antibiotics presumably for aspiration.  She has COPD exacerbation and her COPD is pretty severe at baseline with chronic hypoxic respiratory failure.  Her oxygen requirement has diminished markedly and she is now on 2 L with high flow nasal cannula with 100% oxygen saturation.  She has had metabolic encephalopathy multifactorial which has improved. Principal Problem:   Sepsis secondary to UTI [facility]) Active Problems:   Essential hypertension   COPD exacerbation (HCC)   GAD (generalized anxiety disorder)   Chronic pain syndrome   Tobacco use disorder   Chronic respiratory failure (HCC)   Diabetes mellitus (HCC)   Tachycardia   Cocaine abuse ([place])    Plan: Continue treatments.    LOS: 4 days   Krystle Polcyn L 10/11/2017, 8:07 AM

## 2017-10-11 NOTE — Progress Notes (Signed)
Inpatient Diabetes Program Recommendations  AACE/ADA: New Consensus Statement on Inpatient Glycemic Control (2015)  Target Ranges:  Prepandial:   less than 140 mg/dL      Peak postprandial:   less than 180 mg/dL (1-2 hours)      Critically ill patients:  140 - 180 mg/dL   Results for Tammy Blair, Tammy Blair (MRN [id number]) as of 10/11/2017 07:14  Ref. Range 10/10/2017 07:44 10/10/2017 11:37 10/10/2017 16:23 10/10/2017 23:16  Glucose-Capillary Latest Ref Range: 65 - 99 mg/dL 191 (H) 195 (H) 262 (H) 293 (H)   Review of Glycemic Control  Diabetes history: DM2 Outpatient Diabetes medications: Metformin XR 500 mg QPM Current orders for Inpatient glycemic control: Novolog 0-9 units TID with meals  Inpatient Diabetes Program Recommendations: Correction (SSI): Please consider ordering Novolog 0-5 units QHS for bedtime correction scale. Insulin-Meal Coverage: If steroids are continued, please consider ordering Novolog 4 units TID with meals for meal coverage if patient eats at least 50% of meals.  NOTE: Patient is ordered Solumedrol 40 mg Q12H which is contributing to hyperglycemia. Post prandial glucose is consistently elevated and bedtime glucose was 293 mg/dl last night but no insulin given since no bedtime correction ordered.  Thanks, Barnie Alderman, RN, MSN, CDE Diabetes Coordinator Inpatient Diabetes Program [phone] (Team Pager from 8am to 5pm)

## 2017-10-12 DIAGNOSIS — R4182 Altered mental status, unspecified: Secondary | ICD-10-CM

## 2017-10-12 LAB — CULTURE, BLOOD (ROUTINE X 2)
CULTURE: NO GROWTH
Culture: NO GROWTH
SPECIAL REQUESTS: ADEQUATE

## 2017-10-12 LAB — GLUCOSE, CAPILLARY
GLUCOSE-CAPILLARY: 208 mg/dL — AB (ref 65–99)
Glucose-Capillary: 148 mg/dL — ABNORMAL HIGH (ref 65–99)

## 2017-10-12 MED ORDER — PREDNISONE 10 MG PO TABS: ORAL_TABLET | ORAL | 0 refills | Status: DC

## 2017-10-12 MED ORDER — GUAIFENESIN ER 600 MG PO TB12: 600.0000 mg | ORAL_TABLET | Freq: Two times a day (BID) | ORAL | 2 refills | Status: DC

## 2017-10-12 MED ORDER — CYCLOBENZAPRINE HCL 10 MG PO TABS: 5.0000 mg | ORAL_TABLET | Freq: Three times a day (TID) | ORAL | 0 refills | Status: DC | PRN

## 2017-10-12 MED ORDER — DILTIAZEM HCL 60 MG PO TABS: 60.0000 mg | ORAL_TABLET | Freq: Two times a day (BID) | ORAL | 0 refills | Status: DC

## 2017-10-12 MED ORDER — OXYCODONE HCL 10 MG PO TABS: 5.0000 mg | ORAL_TABLET | Freq: Four times a day (QID) | ORAL | 0 refills | Status: DC | PRN

## 2017-10-12 MED ORDER — AMOXICILLIN-POT CLAVULANATE 875-125 MG PO TABS: 1.0000 | ORAL_TABLET | Freq: Two times a day (BID) | ORAL | 0 refills | Status: DC

## 2017-10-12 NOTE — Discharge Summary (Signed)
Physician Discharge Summary  Tammy Blair TIR:[id number] DOB: 11/27/1951 DOA: 10/07/2017  PCP: [name], MD  Admit date: 10/07/2017 Discharge date: 10/12/2017  Admitted From: home Disposition:  home  Recommendations for Outpatient Follow-up:  1. Follow up with PCP in 1-2 weeks 2. Please obtain BMP/CBC in one week 3. Repeat chest xray in 78month to ensure resolution of infiltrates  Home Health: HHPT, RN Equipment/Devices:  Discharge Condition: stable CODE STATUS: full code Diet recommendation: Heart Healthy / Carb Modified  Brief/Interim Summary: 67 year old female who was brought to the hospital and acute encephalopathy.  She has been somnolent for approximately 48 hours prior to admission.  She was noted to be febrile, tachycardic on initial evaluation and also was noted to have an oxygen saturation of 89%.  She had bilateral wheezing.  Urine drug screen was positive for benzodiazepines and cocaine.  She was admitted to the hospital for treatment of sepsis and was found to have left upper lobe pneumonia.  She was started on intravenous antibiotics, steroids.  Pulmonology has followed her during her hospital course.  Patient is clinically improving.  Discharge Diagnoses:  Principal Problem:   Sepsis secondary to UTI [facility]) Active Problems:   Essential hypertension   COPD exacerbation (HCC)   GAD (generalized anxiety disorder)   Chronic pain syndrome   Tobacco use disorder   Chronic respiratory failure (HCC)   Diabetes mellitus (HCC)   Tachycardia   Cocaine abuse ([place])  1. Sepsis due to aspiration pneumonia in the left upper lobe.  Present on admission.  Patient was treated with intravenous Zosyn and has significantly improved.  She is now afebrile.  Blood cultures and urine culture have shown no growth.  She has been transitioned to Augmentin.  She was seen by speech therapy is started on a mechanical soft diet. 2. Acute on chronic hypoxic and hypercapnic respiratory  failure.  Overall respiratory status has improved with treatment.    She reports that she is chronically on 2-1/2 L.  Appears to be comfortably at her baseline oxygen requirement at this time. 3. Metabolic encephalopathy with delirium.  She is on several medications including Xanax, gabapentin, Flexeril and oxycodone.  These medications have been resumed at lower dosages.  Mental status appears to be approaching baseline.  Decline in mental status likely related to sepsis/dehydration 4. Acute kidney injury.  She initially presented with a creatinine of 1.45.  This is improved to 0.5 with hydration and is back to baseline.   5. COPD exacerbation.  Overall breathing and wheezing has improved.  She was on IV steroids and bronchodilators.    She has been transitioned to prednisone taper.  Continue inhaled steroids and nebulizer treatments at home.. 6. Diabetes.  Blood sugars currently stable.    She was treated with sliding scale insulin during her hospitalization.  Resume metformin on discharge. 7. Substance abuse.  Patient was found to be cocaine positive.  For this reason, her metoprolol has been discontinued.  She is been started on diltiazem for blood pressure/heart rate control 8. Anxiety.  Continue on Xanax.  Discharge Instructions  Discharge Instructions    Diet - low sodium heart healthy    Complete by:  As directed    Increase activity slowly    Complete by:  As directed      Allergies as of 10/12/2017   No Known Allergies     Medication List    STOP taking these medications   metoprolol tartrate 100 MG tablet Commonly known as:  LOPRESSOR     TAKE these medications   albuterol 108 (90 Base) MCG/ACT inhaler Commonly known as:  PROVENTIL HFA;VENTOLIN HFA Inhale 1-2 puffs into the lungs every 4 (four) hours as needed for wheezing or shortness of breath.   albuterol (2.5 MG/3ML) 0.083% nebulizer solution Commonly known as:  PROVENTIL Take 3 mLs (2.5 mg total) by nebulization  every 4 (four) hours as needed. For shortness of breath   ALPRAZolam 0.5 MG tablet Commonly known as:  XANAX Take 0.5 mg by mouth 3 (three) times daily as needed for anxiety. For anxiety   amoxicillin-clavulanate 875-125 MG tablet Commonly known as:  AUGMENTIN Take 1 tablet by mouth every 12 (twelve) hours.   budesonide-formoterol 160-4.5 MCG/ACT inhaler Commonly known as:  SYMBICORT Inhale 1 puff into the lungs 2 (two) times daily.   cyclobenzaprine 10 MG tablet Commonly known as:  FLEXERIL Take 0.5 tablets (5 mg total) by mouth 3 (three) times daily as needed for muscle spasms. For muscle spasm What changed:  how much to take   diltiazem 60 MG tablet Commonly known as:  CARDIZEM Take 1 tablet (60 mg total) by mouth 2 (two) times daily.   gabapentin 300 MG capsule Commonly known as:  NEURONTIN Take 300 mg by mouth 3 (three) times daily.   guaiFENesin 600 MG 12 hr tablet Commonly known as:  MUCINEX Take 1 tablet (600 mg total) by mouth 2 (two) times daily.   metFORMIN 500 MG 24 hr tablet Commonly known as:  GLUCOPHAGE-XR Take 500 mg by mouth every evening. With supper.   naproxen 500 MG tablet Commonly known as:  NAPROSYN Take 1 tablet (500 mg total) by mouth 2 (two) times daily with a meal.   Oxycodone HCl 10 MG Tabs Take 0.5 tablets (5 mg total) by mouth 4 (four) times daily as needed. What changed:  how much to take  reasons to take this   predniSONE 10 MG tablet Commonly known as:  DELTASONE Take 40mg  po daily for 2 days then 30mg  daily for 2 days then 20mg  daily for 2 days then 10mg  daily for 2 days then stop   ranitidine 150 MG tablet Commonly known as:  ZANTAC Take 150 mg by mouth 2 (two) times daily.   tiotropium 18 MCG inhalation capsule Commonly known as:  SPIRIVA Place 18 mcg into inhaler and inhale daily.       No Known Allergies  Consultations:  pulmonology   Procedures/Studies: Dg Chest 1 View  Result Date: 10/07/2017 CLINICAL  DATA:  66 year old female with altered mental status. EXAM: CHEST 1 VIEW COMPARISON:  Chest radiograph dated 08/11/2016. FINDINGS: There is patchy area of opacity in the left lung primarily involving the mid to upper lung field. This may represent an area of pulmonary contusion, or pneumonia. Correlation with clinical exam recommended. The right lung is clear. There is no pleural effusion or pneumothorax. No acute osseous pathology identified. IMPRESSION: Patchy opacity in the left upper lung field may represent infiltrate versus pulmonary contusion. Clinical correlation is recommended. Electronically Signed   By: Anner Crete M.D.   On: 10/07/2017 03:16   Ct Head Wo Contrast  Result Date: 10/07/2017 CLINICAL DATA:  Altered mental status, nonverbal. History of hypertension, diabetes. EXAM: CT HEAD WITHOUT CONTRAST TECHNIQUE: Contiguous axial images were obtained from the base of the skull through the vertex without intravenous contrast. COMPARISON:  None. FINDINGS: BRAIN: No intraparenchymal hemorrhage, mass effect nor midline shift. The ventricles and sulci are normal for age. No acute  large vascular territory infarcts. No abnormal extra-axial fluid collections. Basal cisterns are patent. VASCULAR: Mild calcific atherosclerosis of the carotid siphons. SKULL: No skull fracture. No significant scalp soft tissue swelling. SINUSES/ORBITS: LEFT maxillary mucosal retention cysts without paranasal sinus air-fluid levels. Small RIGHT mastoid effusion. Status post LEFT wall down mastoidectomy. The included ocular globes and orbital contents are non-suspicious. OTHER: Patient is edentulous. IMPRESSION: 1. Negative noncontrast CT HEAD for age. Electronically Signed   By: Elon Alas M.D.   On: 10/07/2017 03:20   Ct Chest Wo Contrast  Result Date: 10/09/2017 CLINICAL DATA:  Pneumonia, unresolved or complicated. Fever. Possible aspiration. EXAM: CT CHEST WITHOUT CONTRAST TECHNIQUE: Multidetector CT imaging  of the chest was performed following the standard protocol without IV contrast. COMPARISON:  Radiographs yesterday. FINDINGS: Cardiovascular: Normal caliber thoracic aorta with atherosclerosis. Heart size upper normal. Breathing motion artifact obscures detailed evaluation. Mediastinum/Nodes: Enlarged AP window node measures 15 mm short axis. Prominent prevascular and lower paratracheal nodes. Limited assessment for hilar adenopathy given lack of IV contrast. Esophagus is decompressed. Visualized thyroid gland is normal. Lungs/Pleura: Breathing motion artifact partially obscures detailed evaluation. Dense consolidation throughout the left upper lobe with air bronchograms and surrounding ground-glass opacity. Consolidation is rather extensive and extends from the lingula to the apex. Bilateral dependent opacities in both lower lobes, partially obscured by motion. Minimal patchy opacities at the right lung apex. Possible small pleural effusions adjacent to lower lobe consolidations. Limited assessment of the trachea and mainstem bronchi for intraluminal debris given motion, no large filling defect. Upper Abdomen: Possible nodular hepatic contours. Musculoskeletal: No evidence of acute abnormality allowing for breathing motion. IMPRESSION: 1. Dense left upper lobe consolidation with air bronchograms and surrounding ground-glass opacities, suspicious for pneumonia. Aspiration is considered, however distribution favors lobar pneumonia. Recommend radiographic follow-up to resolution. 2. Additional dependent densities in both lung bases, more typical distribution for aspiration, alternatively could represent atelectasis. Possible small adjacent pleural effusions. 3. Mild mediastinal adenopathy is nonspecific but likely reactive. 4.  Aortic Atherosclerosis (ICD10-I70.0). 5. Possible nodular hepatic contours in the upper abdomen which can be seen with cirrhosis. Recommend correlation for cirrhosis risk factors. 6. Moderate  motion limitations. Electronically Signed   By: Jeb Levering M.D.   On: 10/09/2017 00:26    Echo: - Left ventricle: The cavity size was normal. Systolic function was   normal. The estimated ejection fraction was in the range of 55%   to 60%. Wall motion was normal; there were no regional wall   motion abnormalities. - Aortic valve: Valve area (VTI): 2.62 cm^2. Valve area (Vmax): 2.6   cm^2. Valve area (Vmean): 2.57 cm^2. - Atrial septum: There was increased thickness of the septum,   consistent with lipomatous hypertrophy. - Pulmonary arteries: Systolic pressure was mildly increased. PA   peak pressure: 40 mm Hg (S). - Pericardium, extracardiac: A small pericardial effusion was   identified. There was no echocardiographic evidence for tamponade   physiology.   Subjective: Patient has not had any significant coughing or shortness of breath  Discharge Exam: Vitals:   10/12/17 0800 10/12/17 0819  BP:    Pulse:    Resp:    Temp:    SpO2: 94% 94%   Vitals:   10/12/17 0425 10/12/17 0746 10/12/17 0800 10/12/17 0819  BP: (!) 146/84     Pulse:      Resp:      Temp:  97.9 F (36.6 C)    TempSrc:  Oral    SpO2:  94% 94%  Weight:      Height:        General: Pt is alert, awake, not in acute distress Cardiovascular: RRR, S1/S2 +, no rubs, no gallops Respiratory: CTA bilaterally, no wheezing, no rhonchi Abdominal: Soft, NT, ND, bowel sounds + Extremities: no edema, no cyanosis    The results of significant diagnostics from this hospitalization (including imaging, microbiology, ancillary and laboratory) are listed below for reference.     Microbiology: Recent Results (from the past 240 hour(s))  Urine culture     Status: None   Collection Time: 10/07/17  2:58 AM  Result Value Ref Range Status   Specimen Description URINE, CATHETERIZED  Final   Special Requests NONE  Final   Culture   Final    NO GROWTH Performed at [facility], [address],  [place], [place] [address]    Report Status 10/08/2017 FINAL  Final  Blood culture (routine x 2)     Status: None   Collection Time: 10/07/17  3:11 AM  Result Value Ref Range Status   Specimen Description RIGHT ANTECUBITAL  Final   Special Requests   Final    BOTTLES DRAWN AEROBIC AND ANAEROBIC Blood Culture adequate volume   Culture NO GROWTH 5 DAYS  Final   Report Status 10/12/2017 FINAL  Final  Blood culture (routine x 2)     Status: None   Collection Time: 10/07/17  3:30 AM  Result Value Ref Range Status   Specimen Description BLOOD RIGHT HAND  Final   Special Requests   Final    BOTTLES DRAWN AEROBIC ONLY Blood Culture results may not be optimal due to an inadequate volume of blood received in culture bottles   Culture NO GROWTH 5 DAYS  Final   Report Status 10/12/2017 FINAL  Final  MRSA PCR Screening     Status: None   Collection Time: 10/07/17  6:14 AM  Result Value Ref Range Status   MRSA by PCR NEGATIVE NEGATIVE Final    Comment:        The GeneXpert MRSA Assay (FDA approved for NASAL specimens only), is one component of a comprehensive MRSA colonization surveillance program. It is not intended to diagnose MRSA infection nor to guide or monitor treatment for MRSA infections.   Culture, blood (routine x 2)     Status: None (Preliminary result)   Collection Time: 10/08/17  9:24 PM  Result Value Ref Range Status   Specimen Description BLOOD RIGHT ARM  Final   Special Requests   Final    BOTTLES DRAWN AEROBIC ONLY Blood Culture adequate volume   Culture NO GROWTH 4 DAYS  Final   Report Status PENDING  Incomplete  Culture, blood (Routine X 2) w Reflex to ID Panel     Status: None (Preliminary result)   Collection Time: 10/09/17  4:23 AM  Result Value Ref Range Status   Specimen Description RIGHT ANTECUBITAL  Final   Special Requests   Final    BOTTLES DRAWN AEROBIC AND ANAEROBIC Blood Culture adequate volume   Culture NO GROWTH 3 DAYS  Final   Report Status PENDING   Incomplete     Labs: BNP (last 3 results) No results for input(s): BNP in the last 8760 hours. Basic Metabolic Panel:  Recent Labs Lab 10/07/17 0310 10/07/17 1222 10/08/17 0423 10/09/17 0423 10/10/17 0514 10/11/17 0539  NA 136 137 136 139 137 138  K 4.2 4.0 3.6 3.6 3.5 4.2  CL 99* 105  103 102 102 103  CO2 24 25 26 27 29 28   GLUCOSE 256* 283* 261* 154* 206* 285*  BUN 42* 33* 23* 16 22* 22*  CREATININE 1.45* 1.03* 0.78 0.73 0.58 0.58  CALCIUM 9.1 8.4* 8.6* 9.1 8.6* 8.4*  MG 2.4  --   --   --   --  2.1  PHOS 3.5  --   --   --   --   --    Liver Function Tests:  Recent Labs Lab 10/07/17 0310 10/08/17 0423  AST 73* 68*  ALT 38 35  ALKPHOS 75 65  BILITOT 0.6 0.3  PROT 8.5* 6.7  ALBUMIN 3.1* 2.6*   No results for input(s): LIPASE, AMYLASE in the last 168 hours. No results for input(s): AMMONIA in the last 168 hours. CBC:  Recent Labs Lab 10/07/17 0310 10/08/17 0423 10/09/17 0423 10/10/17 0514 10/11/17 0539  WBC 11.3* 12.6* 10.0 13.1* 13.2*  NEUTROABS 9.1* 10.9* 8.0* 11.4* 11.6*  HGB 14.6 12.1 12.9 11.6* 11.6*  HCT 42.8 37.3 38.5 35.9* 35.4*  MCV 93.9 94.7 95.1 94.5 93.7  PLT 214 226 PLATELET CLUMPS NOTED ON SMEAR, COUNT APPEARS ADEQUATE 242 259   Cardiac Enzymes:  Recent Labs Lab 10/07/17 0310  TROPONINI <0.03   BNP: Invalid input(s): POCBNP CBG:  Recent Labs Lab 10/11/17 0806 10/11/17 1141 10/11/17 1630 10/11/17 2054 10/12/17 0747  GLUCAP 247* 220* 266* 252* 148*   D-Dimer No results for input(s): DDIMER in the last 72 hours. Hgb A1c No results for input(s): HGBA1C in the last 72 hours. Lipid Profile No results for input(s): CHOL, HDL, LDLCALC, TRIG, CHOLHDL, LDLDIRECT in the last 72 hours. Thyroid function studies No results for input(s): TSH, T4TOTAL, T3FREE, THYROIDAB in the last 72 hours.  Invalid input(s): FREET3 Anemia work up No results for input(s): VITAMINB12, FOLATE, FERRITIN, TIBC, IRON, RETICCTPCT in the last 72  hours. Urinalysis    Component Value Date/Time   COLORURINE YELLOW 10/08/2017 2050   APPEARANCEUR CLEAR 10/08/2017 2050   LABSPEC 1.020 10/08/2017 2050   PHURINE 5.0 10/08/2017 2050   GLUCOSEU 50 (A) 10/08/2017 2050   HGBUR SMALL (A) 10/08/2017 2050   [place] NEGATIVE 10/08/2017 2050   [place] 10/08/2017 2050   PROTEINUR 30 (A) 10/08/2017 2050   UROBILINOGEN 0.2 07/17/2014 1613   NITRITE NEGATIVE 10/08/2017 2050   LEUKOCYTESUR NEGATIVE 10/08/2017 2050   Sepsis Labs Invalid input(s): PROCALCITONIN,  WBC,  LACTICIDVEN Microbiology Recent Results (from the past 240 hour(s))  Urine culture     Status: None   Collection Time: 10/07/17  2:58 AM  Result Value Ref Range Status   Specimen Description URINE, CATHETERIZED  Final   Special Requests NONE  Final   Culture   Final    NO GROWTH Performed at [facility], [place] [address], [place], [place] [address]    Report Status 10/08/2017 FINAL  Final  Blood culture (routine x 2)     Status: None   Collection Time: 10/07/17  3:11 AM  Result Value Ref Range Status   Specimen Description RIGHT ANTECUBITAL  Final   Special Requests   Final    BOTTLES DRAWN AEROBIC AND ANAEROBIC Blood Culture adequate volume   Culture NO GROWTH 5 DAYS  Final   Report Status 10/12/2017 FINAL  Final  Blood culture (routine x 2)     Status: None   Collection Time: 10/07/17  3:30 AM  Result Value Ref Range Status   Specimen Description BLOOD RIGHT HAND  Final   Special Requests   Final    BOTTLES DRAWN AEROBIC ONLY Blood Culture results may not be optimal due to an inadequate volume of blood received in culture bottles   Culture NO GROWTH 5 DAYS  Final   Report Status 10/12/2017 FINAL  Final  MRSA PCR Screening     Status: None   Collection Time: 10/07/17  6:14 AM  Result Value Ref Range Status   MRSA by PCR NEGATIVE NEGATIVE Final    Comment:        The GeneXpert MRSA Assay (FDA approved for NASAL specimens only), is one  component of a comprehensive MRSA colonization surveillance program. It is not intended to diagnose MRSA infection nor to guide or monitor treatment for MRSA infections.   Culture, blood (routine x 2)     Status: None (Preliminary result)   Collection Time: 10/08/17  9:24 PM  Result Value Ref Range Status   Specimen Description BLOOD RIGHT ARM  Final   Special Requests   Final    BOTTLES DRAWN AEROBIC ONLY Blood Culture adequate volume   Culture NO GROWTH 4 DAYS  Final   Report Status PENDING  Incomplete  Culture, blood (Routine X 2) w Reflex to ID Panel     Status: None (Preliminary result)   Collection Time: 10/09/17  4:23 AM  Result Value Ref Range Status   Specimen Description RIGHT ANTECUBITAL  Final   Special Requests   Final    BOTTLES DRAWN AEROBIC AND ANAEROBIC Blood Culture adequate volume   Culture NO GROWTH 3 DAYS  Final   Report Status PENDING  Incomplete     Time coordinating discharge: Over 30 minutes  SIGNED:   Kathie Dike, MD  [facility] 10/12/2017, 10:31 AM Pager   If 7PM-7AM, please contact night-coverage [email or web address] Password [email or web address]

## 2017-10-12 NOTE — Progress Notes (Signed)
She is being set up for discharge probably today.  She is on oral medications.  I agree this is appropriate.  I will plan to sign off.  She will need chest x-ray to follow-up and to be sure that her infiltrate clears.  Considering her COPD this should probably be done in about a month as it may take that long for it to clear  Thanks for allowing me to see her with you

## 2017-10-12 NOTE — Care Management Note (Signed)
Case Management Note  Patient Details  Name: Tammy Blair MRN: [id number] Date of Birth: May 26, 1951  Expected Discharge Date:  10/12/17               Expected Discharge Plan:  [place]  In-House Referral:  NA  Discharge planning Services  CM Consult  Post Acute Care Choice:  Home Health Choice offered to:  Patient  HH Arranged:  RN [facility] Agency:  [place]  Status of Service:  Completed, signed off  If discussed at [place] of Stay Meetings, dates discussed:  10/12/2017  Additional Comments:  Discharging home today. Pt now agreeable to [facility] RN. (Medicaid will not pay for PT, pt aware.) Has no preference of [place] provider, agreeable to [facility]. Pt planning to go stay with her son, also in [place]. Aware HH has 48 hs to make first visit. [name], [facility] rep, aware of referral and will pull pt info and orders from [facility]. Pt has home oxygen PTA and communicates no further needs or concerns about DC plan.   Sherald Barge, RN 10/12/2017, 11:15 AM

## 2017-10-13 LAB — CULTURE, BLOOD (ROUTINE X 2)
Culture: NO GROWTH
Special Requests: ADEQUATE

## 2017-10-14 LAB — CULTURE, BLOOD (ROUTINE X 2)
Culture: NO GROWTH
SPECIAL REQUESTS: ADEQUATE

## 2018-06-11 ENCOUNTER — Ambulatory Visit (INDEPENDENT_AMBULATORY_CARE_PROVIDER_SITE_OTHER): Payer: Medicaid Other | Admitting: Otolaryngology

## 2018-06-21 ENCOUNTER — Inpatient Hospital Stay (HOSPITAL_COMMUNITY)
Admission: EM | Admit: 2018-06-21 | Discharge: 2018-06-23 | DRG: 190 | Disposition: A | Discharge status: Home / Self Care | Payer: Medicare Other | Attending: Internal Medicine | Admitting: Internal Medicine

## 2018-06-21 ENCOUNTER — Encounter (HOSPITAL_COMMUNITY): Payer: Self-pay

## 2018-06-21 ENCOUNTER — Other Ambulatory Visit: Payer: Self-pay

## 2018-06-21 ENCOUNTER — Emergency Department (HOSPITAL_COMMUNITY): Payer: Medicare Other

## 2018-06-21 DIAGNOSIS — Z823 Family history of stroke: Secondary | ICD-10-CM

## 2018-06-21 DIAGNOSIS — M25552 Pain in left hip: Secondary | ICD-10-CM | POA: Diagnosis not present

## 2018-06-21 DIAGNOSIS — Z79899 Other long term (current) drug therapy: Secondary | ICD-10-CM | POA: Diagnosis not present

## 2018-06-21 DIAGNOSIS — J449 Chronic obstructive pulmonary disease, unspecified: Secondary | ICD-10-CM | POA: Insufficient documentation

## 2018-06-21 DIAGNOSIS — Z825 Family history of asthma and other chronic lower respiratory diseases: Secondary | ICD-10-CM

## 2018-06-21 DIAGNOSIS — Z7951 Long term (current) use of inhaled steroids: Secondary | ICD-10-CM | POA: Diagnosis not present

## 2018-06-21 DIAGNOSIS — J9622 Acute and chronic respiratory failure with hypercapnia: Secondary | ICD-10-CM | POA: Diagnosis present

## 2018-06-21 DIAGNOSIS — E119 Type 2 diabetes mellitus without complications: Secondary | ICD-10-CM

## 2018-06-21 DIAGNOSIS — G9341 Metabolic encephalopathy: Secondary | ICD-10-CM | POA: Diagnosis not present

## 2018-06-21 DIAGNOSIS — M549 Dorsalgia, unspecified: Secondary | ICD-10-CM | POA: Diagnosis present

## 2018-06-21 DIAGNOSIS — J441 Chronic obstructive pulmonary disease with (acute) exacerbation: Secondary | ICD-10-CM | POA: Diagnosis not present

## 2018-06-21 DIAGNOSIS — M25551 Pain in right hip: Secondary | ICD-10-CM | POA: Diagnosis not present

## 2018-06-21 DIAGNOSIS — I10 Essential (primary) hypertension: Secondary | ICD-10-CM | POA: Diagnosis present

## 2018-06-21 DIAGNOSIS — M5416 Radiculopathy, lumbar region: Secondary | ICD-10-CM | POA: Diagnosis not present

## 2018-06-21 DIAGNOSIS — J9610 Chronic respiratory failure, unspecified whether with hypoxia or hypercapnia: Secondary | ICD-10-CM | POA: Diagnosis present

## 2018-06-21 DIAGNOSIS — E669 Obesity, unspecified: Secondary | ICD-10-CM | POA: Diagnosis not present

## 2018-06-21 DIAGNOSIS — F1410 Cocaine abuse, uncomplicated: Secondary | ICD-10-CM | POA: Diagnosis present

## 2018-06-21 DIAGNOSIS — Z720 Tobacco use: Secondary | ICD-10-CM

## 2018-06-21 DIAGNOSIS — J9612 Chronic respiratory failure with hypercapnia: Secondary | ICD-10-CM

## 2018-06-21 DIAGNOSIS — H9190 Unspecified hearing loss, unspecified ear: Secondary | ICD-10-CM | POA: Diagnosis not present

## 2018-06-21 DIAGNOSIS — Z9981 Dependence on supplemental oxygen: Secondary | ICD-10-CM | POA: Diagnosis not present

## 2018-06-21 DIAGNOSIS — F411 Generalized anxiety disorder: Secondary | ICD-10-CM | POA: Diagnosis not present

## 2018-06-21 DIAGNOSIS — Z7984 Long term (current) use of oral hypoglycemic drugs: Secondary | ICD-10-CM | POA: Diagnosis not present

## 2018-06-21 DIAGNOSIS — Z809 Family history of malignant neoplasm, unspecified: Secondary | ICD-10-CM | POA: Diagnosis not present

## 2018-06-21 DIAGNOSIS — J9611 Chronic respiratory failure with hypoxia: Secondary | ICD-10-CM

## 2018-06-21 DIAGNOSIS — F17210 Nicotine dependence, cigarettes, uncomplicated: Secondary | ICD-10-CM | POA: Diagnosis not present

## 2018-06-21 DIAGNOSIS — G894 Chronic pain syndrome: Secondary | ICD-10-CM | POA: Diagnosis not present

## 2018-06-21 DIAGNOSIS — Z6830 Body mass index (BMI) 30.0-30.9, adult: Secondary | ICD-10-CM

## 2018-06-21 DIAGNOSIS — J9602 Acute respiratory failure with hypercapnia: Secondary | ICD-10-CM | POA: Diagnosis present

## 2018-06-21 DIAGNOSIS — R4182 Altered mental status, unspecified: Secondary | ICD-10-CM

## 2018-06-21 LAB — I-STAT CG4 LACTIC ACID, ED: LACTIC ACID, VENOUS: 1.26 mmol/L (ref 0.5–1.9)

## 2018-06-21 LAB — BLOOD GAS, VENOUS
ACID-BASE EXCESS: 4.4 mmol/L — AB (ref 0.0–2.0)
Bicarbonate: 26.3 mmol/L (ref 20.0–28.0)
O2 Saturation: 94.6 %
PO2 VEN: 79.4 mmHg — AB (ref 32.0–45.0)
Patient temperature: 37
pCO2, Ven: 66.8 mmHg — ABNORMAL HIGH (ref 44.0–60.0)
pH, Ven: 7.284 (ref 7.250–7.430)

## 2018-06-21 LAB — COMPREHENSIVE METABOLIC PANEL
ALBUMIN: 3.9 g/dL (ref 3.5–5.0)
ALT: 26 U/L (ref 0–44)
ANION GAP: 7 (ref 5–15)
AST: 33 U/L (ref 15–41)
Alkaline Phosphatase: 54 U/L (ref 38–126)
BILIRUBIN TOTAL: 0.4 mg/dL (ref 0.3–1.2)
BUN: 9 mg/dL (ref 8–23)
CO2: 30 mmol/L (ref 22–32)
Calcium: 8.8 mg/dL — ABNORMAL LOW (ref 8.9–10.3)
Chloride: 100 mmol/L (ref 98–111)
Creatinine, Ser: 0.64 mg/dL (ref 0.44–1.00)
GFR calc Af Amer: >60 mL/min (ref >60)
GFR calc non Af Amer: >60 mL/min (ref >60)
GLUCOSE: 124 mg/dL — AB (ref 70–99)
POTASSIUM: 5 mmol/L (ref 3.5–5.1)
SODIUM: 137 mmol/L (ref 135–145)
TOTAL PROTEIN: 7.4 g/dL (ref 6.5–8.1)

## 2018-06-21 LAB — RAPID URINE DRUG SCREEN, HOSP PERFORMED
AMPHETAMINES: NOT DETECTED
Benzodiazepines: POSITIVE — AB
COCAINE: POSITIVE — AB
OPIATES: POSITIVE — AB
TETRAHYDROCANNABINOL: NOT DETECTED

## 2018-06-21 LAB — URINALYSIS, ROUTINE W REFLEX MICROSCOPIC
BILIRUBIN URINE: NEGATIVE
Glucose, UA: NEGATIVE mg/dL
Hgb urine dipstick: NEGATIVE
Ketones, ur: NEGATIVE mg/dL
Leukocytes, UA: NEGATIVE
Nitrite: NEGATIVE
PROTEIN: NEGATIVE mg/dL
SPECIFIC GRAVITY, URINE: 1.019 (ref 1.005–1.030)
pH: 5 (ref 5.0–8.0)

## 2018-06-21 LAB — CBC WITH DIFFERENTIAL/PLATELET
BASOS PCT: 0 %
Basophils Absolute: 0.1 10*3/uL (ref 0.0–0.1)
Eosinophils Absolute: 0.8 10*3/uL — ABNORMAL HIGH (ref 0.0–0.7)
Eosinophils Relative: 5 %
HCT: 46.9 % — ABNORMAL HIGH (ref 36.0–46.0)
HEMOGLOBIN: 15 g/dL (ref 12.0–15.0)
Lymphocytes Relative: 22 %
Lymphs Abs: 3.4 10*3/uL (ref 0.7–4.0)
MCH: 31.3 pg (ref 26.0–34.0)
MCHC: 32 g/dL (ref 30.0–36.0)
MCV: 97.9 fL (ref 78.0–100.0)
MONOS PCT: 9 %
Monocytes Absolute: 1.4 10*3/uL — ABNORMAL HIGH (ref 0.1–1.0)
NEUTROS ABS: 9.7 10*3/uL — AB (ref 1.7–7.7)
NEUTROS PCT: 64 %
PLATELETS: 266 10*3/uL (ref 150–400)
RBC: 4.79 MIL/uL (ref 3.87–5.11)
RDW: 13.2 % (ref 11.5–15.5)
WBC: 15.3 10*3/uL — ABNORMAL HIGH (ref 4.0–10.5)

## 2018-06-21 LAB — BLOOD GAS, ARTERIAL
Acid-Base Excess: 6.5 mmol/L — ABNORMAL HIGH (ref 0.0–2.0)
Bicarbonate: 28.2 mmol/L — ABNORMAL HIGH (ref 20.0–28.0)
DRAWN BY: 382351
Delivery systems: POSITIVE
Expiratory PAP: 6
FIO2: 40
INSPIRATORY PAP: 14
LHR: 12 {breaths}/min
O2 SAT: 93.3 %
PCO2 ART: 68.2 mmHg — AB (ref 32.0–48.0)
PO2 ART: 72.7 mmHg — AB (ref 83.0–108.0)
Patient temperature: 37
pH, Arterial: 7.301 — ABNORMAL LOW (ref 7.350–7.450)

## 2018-06-21 LAB — AMMONIA: Ammonia: 47 umol/L — ABNORMAL HIGH (ref 9–35)

## 2018-06-21 LAB — ETHANOL: Alcohol, Ethyl (B): <10 mg/dL (ref <10)

## 2018-06-21 IMAGING — CR DG CHEST 1V PORT
1 series · 1 of 1 positions shown · non-contrast
Comparison: Chest x-ray dated [DATE].

CLINICAL DATA: Cough and congestion.

EXAM:
PORTABLE CHEST 1 VIEW

[portable]
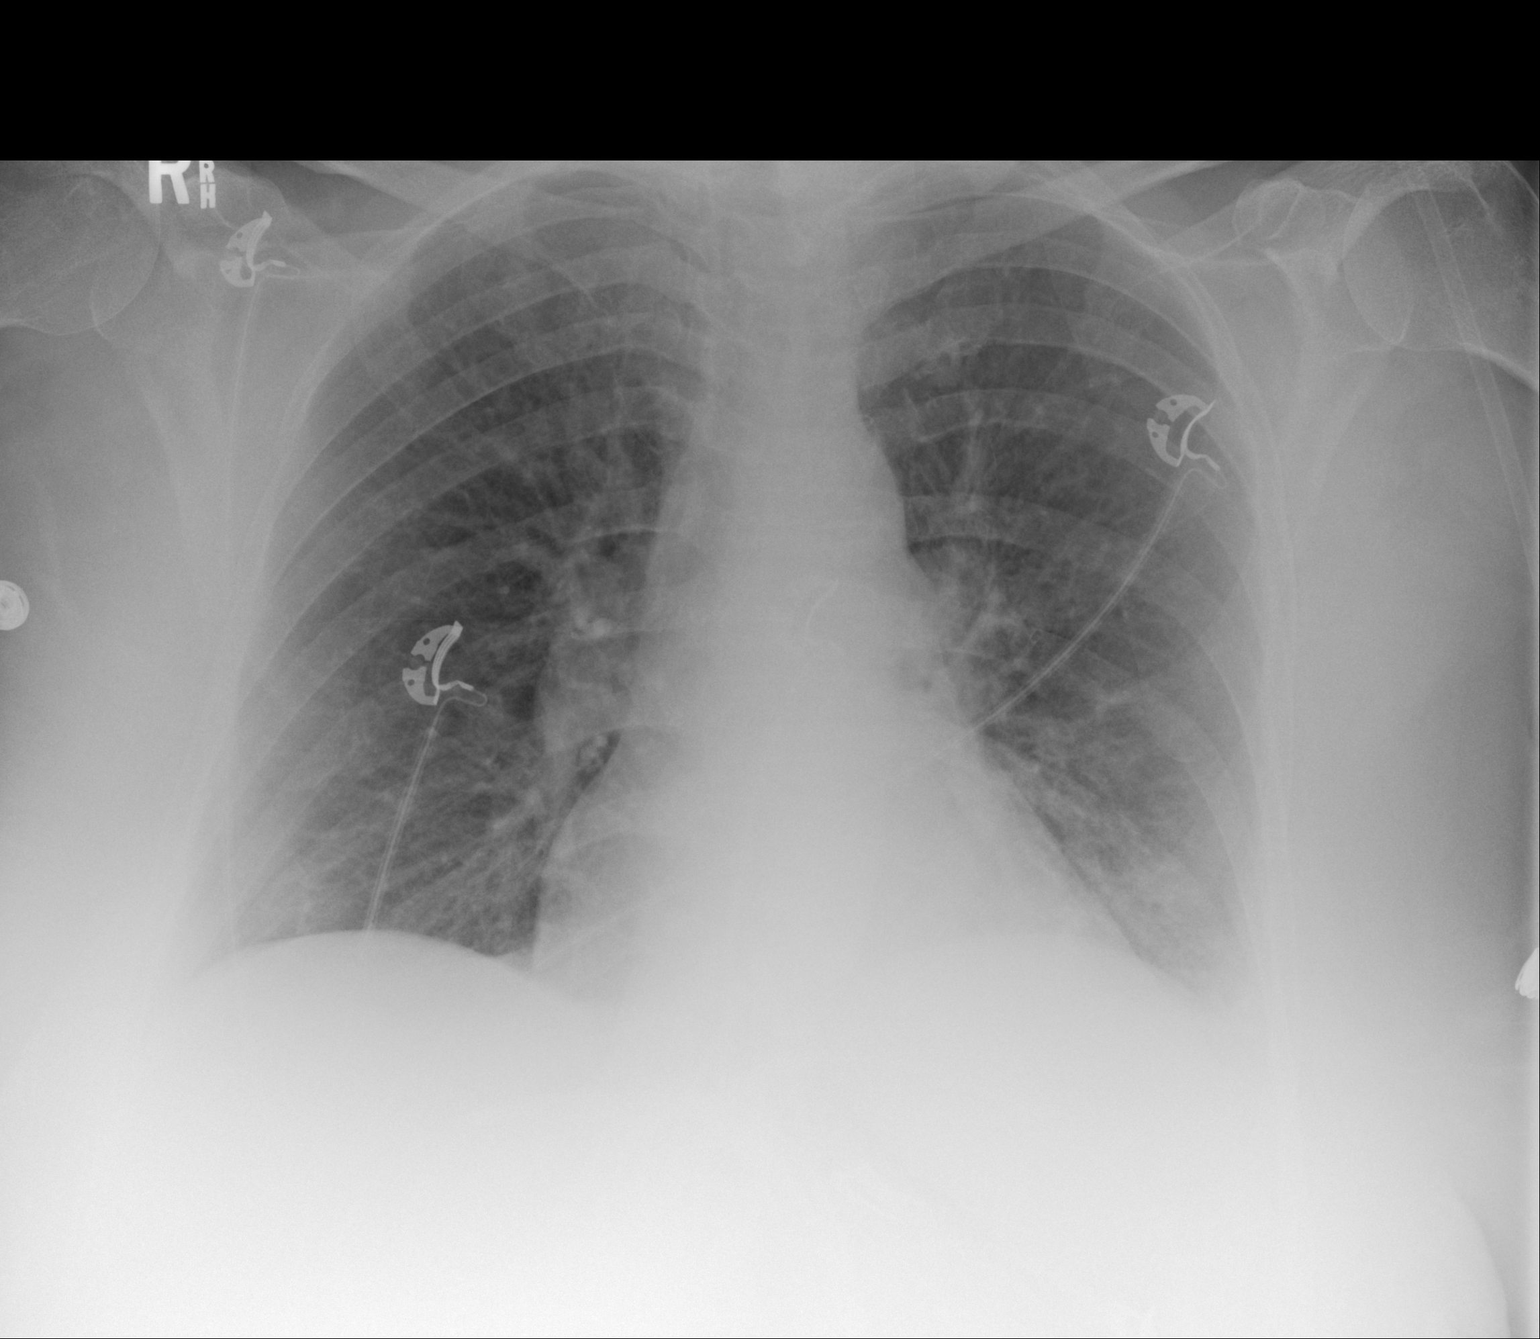

[1 of 1 positions shown; findings below may reference images not displayed]

FINDINGS: Heart size is upper normal. Overall cardiomediastinal silhouette is
within normal limits. Subtle opacity at the LEFT lung base,
pneumonia not excluded. Probable small LEFT pleural effusion, as
also seen on earlier CT of [DATE]. No pneumothorax seen. Osseous
structures about the chest are unremarkable.
IMPRESSION: 1. Subtle opacity at the LEFT lung base, pneumonia versus
atelectasis.
2. Probable small LEFT pleural effusion.

## 2018-06-21 MED ORDER — SODIUM CHLORIDE 0.9 % IV SOLN: 250.0000 mL | INTRAVENOUS | Status: DC | PRN

## 2018-06-21 MED ORDER — IPRATROPIUM BROMIDE 0.02 % IN SOLN: 0.5000 mg | Freq: Four times a day (QID) | RESPIRATORY_TRACT | Status: DC

## 2018-06-21 MED ORDER — TIOTROPIUM BROMIDE MONOHYDRATE 18 MCG IN CAPS: 18.0000 ug | ORAL_CAPSULE | Freq: Every day | RESPIRATORY_TRACT | Status: DC

## 2018-06-21 MED ORDER — GABAPENTIN 300 MG PO CAPS
300.0000 mg | ORAL_CAPSULE | Freq: Three times a day (TID) | ORAL | Status: DC
  Administered 2018-06-21 – 2018-06-23 (×5): 300 mg via ORAL
  Filled 2018-06-21 (×5): qty 1

## 2018-06-21 MED ORDER — ONDANSETRON HCL 4 MG/2ML IJ SOLN: 4.0000 mg | Freq: Four times a day (QID) | INTRAMUSCULAR | Status: DC | PRN

## 2018-06-21 MED ORDER — SODIUM CHLORIDE 0.9% FLUSH: 3.0000 mL | INTRAVENOUS | Status: DC | PRN

## 2018-06-21 MED ORDER — ONDANSETRON HCL 4 MG PO TABS
4.0000 mg | ORAL_TABLET | Freq: Four times a day (QID) | ORAL | Status: DC | PRN
Start: 2018-06-21 — End: 2018-06-23

## 2018-06-21 MED ORDER — DILTIAZEM HCL 60 MG PO TABS
60.0000 mg | ORAL_TABLET | Freq: Two times a day (BID) | ORAL | Status: DC
  Administered 2018-06-21 – 2018-06-23 (×4): 60 mg via ORAL
  Filled 2018-06-21 (×4): qty 1

## 2018-06-21 MED ORDER — METHYLPREDNISOLONE SODIUM SUCC 125 MG IJ SOLR
80.0000 mg | Freq: Two times a day (BID) | INTRAMUSCULAR | Status: DC
  Administered 2018-06-21 – 2018-06-23 (×4): 80 mg via INTRAVENOUS
  Filled 2018-06-21 (×4): qty 2

## 2018-06-21 MED ORDER — ALBUTEROL SULFATE HFA 108 (90 BASE) MCG/ACT IN AERS: 1.0000 | INHALATION_SPRAY | RESPIRATORY_TRACT | Status: DC | PRN

## 2018-06-21 MED ORDER — SODIUM CHLORIDE 0.9 % IV SOLN
500.0000 mg | INTRAVENOUS | Status: DC
  Administered 2018-06-21 – 2018-06-22 (×2): 500 mg via INTRAVENOUS
  Filled 2018-06-21 (×4): qty 500

## 2018-06-21 MED ORDER — METOPROLOL TARTRATE 50 MG PO TABS
100.0000 mg | ORAL_TABLET | Freq: Two times a day (BID) | ORAL | Status: DC
  Administered 2018-06-21 – 2018-06-23 (×4): 100 mg via ORAL
  Filled 2018-06-21 (×4): qty 2

## 2018-06-21 MED ORDER — ALBUTEROL SULFATE (2.5 MG/3ML) 0.083% IN NEBU
2.5000 mg | INHALATION_SOLUTION | RESPIRATORY_TRACT | Status: DC | PRN
Start: 2018-06-21 — End: 2018-06-23

## 2018-06-21 MED ORDER — IPRATROPIUM-ALBUTEROL 0.5-2.5 (3) MG/3ML IN SOLN
3.0000 mL | Freq: Three times a day (TID) | RESPIRATORY_TRACT | Status: DC
  Administered 2018-06-21 – 2018-06-23 (×5): 3 mL via RESPIRATORY_TRACT
  Filled 2018-06-21 (×5): qty 3

## 2018-06-21 MED ORDER — ENOXAPARIN SODIUM 40 MG/0.4ML ~~LOC~~ SOLN
40.0000 mg | SUBCUTANEOUS | Status: DC
  Administered 2018-06-21 – 2018-06-22 (×2): 40 mg via SUBCUTANEOUS
  Filled 2018-06-21 (×2): qty 0.4

## 2018-06-21 MED ORDER — SODIUM CHLORIDE 0.9% FLUSH
3.0000 mL | Freq: Two times a day (BID) | INTRAVENOUS | Status: DC
  Administered 2018-06-21 – 2018-06-23 (×4): 3 mL via INTRAVENOUS

## 2018-06-21 NOTE — H&P (Signed)
History and Physical    Tammy Blair TKZ:[id number] DOB: 18-Apr-1951 DOA: 06/21/2018  PCP: [name], MD  Patient coming from: Home  Chief Complaint: Confusion  HPI: Tammy Blair is a 67 y.o. female with medical history significant of polysubstance abuse, COPD, hypertension, anxiety, asthma comes in from PCPs office for visit after being noted to have altered mental status was sent here via EMS.  Patient is currently on BiPAP.  Her confusion seems to have gotten better but she still not back to baseline.  She is still confused.  History is unreliable.  She denies any fevers nausea vomiting or diarrhea.  She also denies cocaine usage even though it and her urine drug screen.  Patient referred for admission for COPD exacerbation with acute hypercapnic respiratory failure.  Patient is on home oxygen.   Review of Systems: As per HPI otherwise 10 point review of systems negative but grossly unreliable  Past Medical History:  Diagnosis Date  . Anxiety   . Asthma   . Chronic back pain   . COPD (chronic obstructive pulmonary disease) ([place])    on home O2  . Diabetes mellitus without complication ([place])   . HOH (hard of hearing)   . Hypertension   . Lumbar radiculopathy     Past Surgical History:  Procedure Laterality Date  . CESAREAN SECTION    . TONSILLECTOMY       reports that she has been smoking cigarettes.  She has a 20.00 pack-year smoking history. She has never used smokeless tobacco. She reports that she has current or past drug history. She reports that she does not drink alcohol.  No Known Allergies  Family History  Problem Relation Age of Onset  . COPD Mother   . Cancer Mother        kidney  . Stroke Brother   . Asthma Sister     Prior to Admission medications   Medication Sig Start Date End Date Taking? Authorizing Provider  albuterol (PROVENTIL HFA;VENTOLIN HFA) 108 (90 Base) MCG/ACT inhaler Inhale 1-2 puffs into the lungs every 4 (four) hours as needed for  wheezing or shortness of breath. 03/14/16  Yes Mesner, Corene Cornea, MD  albuterol (PROVENTIL) (2.5 MG/3ML) 0.083% nebulizer solution Take 3 mLs (2.5 mg total) by nebulization every 4 (four) hours as needed. For shortness of breath 03/14/16  Yes Mesner, Corene Cornea, MD  ALPRAZolam Duanne Moron) 0.5 MG tablet Take 0.5 mg by mouth 3 (three) times daily as needed for anxiety. For anxiety   Yes [provider]  budesonide-formoterol (SYMBICORT) 160-4.5 MCG/ACT inhaler Inhale 1 puff into the lungs 2 (two) times daily.    Yes [provider]  cyclobenzaprine (FLEXERIL) 10 MG tablet Take 0.5 tablets (5 mg total) by mouth 3 (three) times daily as needed for muscle spasms. For muscle spasm 10/12/17  Yes Kathie Dike, MD  diltiazem (CARDIZEM) 60 MG tablet Take 1 tablet (60 mg total) by mouth 2 (two) times daily. 10/12/17 10/12/18 Yes Kathie Dike, MD  gabapentin (NEURONTIN) 300 MG capsule Take 300 mg by mouth 3 (three) times daily.   Yes [provider]  metFORMIN (GLUCOPHAGE-XR) 500 MG 24 hr tablet Take 500 mg by mouth every evening. With supper.   Yes [provider]  metoprolol tartrate (LOPRESSOR) 100 MG tablet Take 1 tablet by mouth 2 (two) times daily. 03/23/18  Yes [provider]  ranitidine (ZANTAC) 150 MG tablet Take 150 mg by mouth 2 (two) times daily.   Yes [provider]  tiotropium (SPIRIVA) 18 MCG inhalation capsule Place 18 mcg into inhaler and inhale daily.   Yes [provider]  TRIAMCINOLONE ACETONIDE, TOP, (TRIANEX) 0.05 % OINT Apply 1 application topically 2 (two) times daily as needed. 12/28/15  Yes [provider]  amoxicillin-clavulanate (AUGMENTIN) 875-125 MG tablet Take 1 tablet by mouth every 12 (twelve) hours. Patient not taking: Reported on 06/21/2018 10/12/17   Kathie Dike, MD  guaiFENesin (MUCINEX) 600 MG 12 hr tablet Take 1 tablet (600 mg total) by mouth 2 (two) times daily. Patient not taking: Reported on 06/21/2018 10/12/17  10/12/18  Kathie Dike, MD  naproxen (NAPROSYN) 500 MG tablet Take 1 tablet (500 mg total) by mouth 2 (two) times daily with a meal. Patient not taking: Reported on 06/21/2018 08/20/17   Noemi Chapel, MD  Oxycodone HCl 10 MG TABS Take 0.5 tablets (5 mg total) by mouth 4 (four) times daily as needed. Patient not taking: Reported on 06/21/2018 10/12/17   Kathie Dike, MD  predniSONE (DELTASONE) 10 MG tablet Take 40mg  po daily for 2 days then 30mg  daily for 2 days then 20mg  daily for 2 days then 10mg  daily for 2 days then stop Patient not taking: Reported on 06/21/2018 10/12/17   Kathie Dike, MD    Physical Exam: Vitals:   06/21/18 1330 06/21/18 1400 06/21/18 1500 06/21/18 1531  BP: (!) 101/50 (!) 124/54 (!) 117/47 (!) 99/42  Pulse: 60 71 68 63  Resp: 13 (!) 21 19 13   Temp:      TempSrc:      SpO2: 96% 96% 96% 98%  Weight:      Height:          Constitutional: NAD, calm, comfortable on BiPAP Vitals:   06/21/18 1330 06/21/18 1400 06/21/18 1500 06/21/18 1531  BP: (!) 101/50 (!) 124/54 (!) 117/47 (!) 99/42  Pulse: 60 71 68 63  Resp: 13 (!) 21 19 13   Temp:      TempSrc:      SpO2: 96% 96% 96% 98%  Weight:      Height:       Eyes: PERRL, lids and conjunctivae normal ENMT: Mucous membranes are moist. Posterior pharynx clear of any exudate or lesions.Normal dentition.  Neck: normal, supple, no masses, no thyromegaly Respiratory: Diminished but clear to auscultation bilaterally, no wheezing, no crackles. Normal respiratory effort. No accessory muscle use.  Cardiovascular: Regular rate and rhythm, no murmurs / rubs / gallops. No extremity edema. 2+ pedal pulses. No carotid bruits.  Abdomen: no tenderness, no masses palpated. No hepatosplenomegaly. Bowel sounds positive.  Musculoskeletal: no clubbing / cyanosis. No joint deformity upper and lower extremities. Good ROM, no contractures. Normal muscle tone.  Skin: no rashes, lesions, ulcers. No induration Neurologic: CN 2-12 grossly  intact. Sensation intact, DTR normal. Strength 5/5 in all 4.  Psychiatric: Not normal judgment and insight. Alert and oriented x 2. Normal mood.    Labs on Admission: I have personally reviewed following labs and imaging studies  CBC: Recent Labs  Lab 06/21/18 1348  WBC 15.3*  NEUTROABS 9.7*  HGB 15.0  HCT 46.9*  MCV 97.9  PLT 169   Basic Metabolic Panel: Recent Labs  Lab 06/21/18 1348  NA 137  K 5.0  CL 100  CO2 30  GLUCOSE 124*  BUN 9  CREATININE 0.64  CALCIUM 8.8*   GFR: Estimated Creatinine Clearance: 74.6 mL/min (by C-G formula based on SCr of 0.64 mg/dL). Liver Function Tests: Recent Labs  Lab 06/21/18 1348  AST 33  ALT 26  ALKPHOS 54  BILITOT 0.4  PROT 7.4  ALBUMIN 3.9   No results for input(s): LIPASE, AMYLASE in the last 168 hours. Recent Labs  Lab 06/21/18 1348  AMMONIA 47*   Coagulation Profile: No results for input(s): INR, PROTIME in the last 168 hours. Cardiac Enzymes: No results for input(s): CKTOTAL, CKMB, CKMBINDEX, TROPONINI in the last 168 hours. BNP (last 3 results) No results for input(s): PROBNP in the last 8760 hours. HbA1C: No results for input(s): HGBA1C in the last 72 hours. CBG: No results for input(s): GLUCAP in the last 168 hours. Lipid Profile: No results for input(s): CHOL, HDL, LDLCALC, TRIG, CHOLHDL, LDLDIRECT in the last 72 hours. Thyroid Function Tests: No results for input(s): TSH, T4TOTAL, FREET4, T3FREE, THYROIDAB in the last 72 hours. Anemia Panel: No results for input(s): VITAMINB12, FOLATE, FERRITIN, TIBC, IRON, RETICCTPCT in the last 72 hours. Urine analysis:    Component Value Date/Time   COLORURINE YELLOW 06/21/2018 [place] 06/21/2018 1233   LABSPEC 1.019 06/21/2018 1233   PHURINE 5.0 06/21/2018 1233   GLUCOSEU NEGATIVE 06/21/2018 1233   HGBUR NEGATIVE 06/21/2018 1233   BILIRUBINUR NEGATIVE 06/21/2018 1233   KETONESUR NEGATIVE 06/21/2018 1233   PROTEINUR NEGATIVE 06/21/2018 1233     UROBILINOGEN 0.2 07/17/2014 1613   NITRITE NEGATIVE 06/21/2018 1233   LEUKOCYTESUR NEGATIVE 06/21/2018 1233   Sepsis Labs: !!!!!!!!!!!!!!!!!!!!!!!!!!!!!!!!!!!!!!!!!!!! @LABRCNTIP (procalcitonin:4,lacticidven:4) ) Recent Results (from the past 240 hour(s))  Culture, blood (routine x 2)     Status: None (Preliminary result)   Collection Time: 06/21/18  1:48 PM  Result Value Ref Range Status   Specimen Description RIGHT ANTECUBITAL DRAWN BY RN  Final   Special Requests   Final    BOTTLES DRAWN AEROBIC AND ANAEROBIC Blood Culture adequate volume Performed at [facility], [address], [place], [place] [address]    Culture PENDING  Incomplete   Report Status PENDING  Incomplete  Culture, blood (routine x 2)     Status: None (Preliminary result)   Collection Time: 06/21/18  1:48 PM  Result Value Ref Range Status   Specimen Description BLOOD RIGHT HAND DRAWN BY RN  Final   Special Requests   Final    BOTTLES DRAWN AEROBIC AND ANAEROBIC Blood Culture adequate volume Performed at [facility], [address] [place], [place] [address]    Culture PENDING  Incomplete   Report Status PENDING  Incomplete     Radiological Exams on Admission: Dg Chest Port 1 View  Result Date: 06/21/2018 CLINICAL DATA:  Cough and congestion. EXAM: PORTABLE CHEST 1 VIEW COMPARISON:  Chest x-ray dated 10/07/2017. FINDINGS: Heart size is upper normal. Overall cardiomediastinal silhouette is within normal limits. Subtle opacity at the LEFT lung base, pneumonia not excluded. Probable small LEFT pleural effusion, as also seen on earlier CT of 10/08/2017. No pneumothorax seen. Osseous structures about the chest are unremarkable. IMPRESSION: 1. Subtle opacity at the LEFT lung base, pneumonia versus atelectasis. 2. Probable small LEFT pleural effusion. Electronically Signed   By: Franki Cabot M.D.   On: 06/21/2018 14:48    EKG: Independently reviewed.  Normal sinus rhythm no acute changes  Old chart  reviewed  Case discussed with Dr. Eulis Foster in the ED  Chest x-ray reviewed no edema atelectasis versus small infiltrate left lung base noted  Assessment/Plan 67 year old female with acute on chronic hypercapnic respiratory failure secondary to COPD exacerbation and likely component of polysubstance abuse Principal Problem:   COPD exacerbation (HCC)-continue  BiPAP and repeat ABG not hour.  Try to wean off BiPAP tonight.  Placed on IV Solu-Medrol and azithromycin.  Chest x-ray not really clear on if pneumonia but cover for [facility] pneumonia.  White count elevated at 15 but no fever.  Difficult to get review of systems from patient due to her hypercapnia.  Frequent nebulizer treatments.  Placed in stepdown unit for the night.  Active Problems:   Acute respiratory failure with hypercapnia (HCC)-continue BiPAP.  Repeat ABG in about an hour    Acute metabolic encephalopathy-likely multifactorial secondary to her hypercapnia along with polysubstance abuse    Essential hypertension-blood pressure soft in the ED holding blood pressure meds at this time    GAD (generalized anxiety disorder)-holding Xanax due to the above issues    Chronic pain syndrome holding all sedatives due to the above issues-    Chronic respiratory failure (HCC)-noted    Cocaine abuse (HCC)-drug screen is again positive for cocaine today patient adamantly denies any cocaine usage     DVT prophylaxis: Lovenox Code Status: Full Family Communication: None Disposition Plan: 1 to 3 days Consults called: None Admission status: Admission   Okie Jansson A MD [facility]  If 7PM-7AM, please contact night-coverage [email or web address] Password [facility]  06/21/2018, 4:38 PM

## 2018-06-21 NOTE — ED Notes (Signed)
Respiratory in the room with bipap.

## 2018-06-21 NOTE — ED Triage Notes (Signed)
EMS reports pt was at her pcp's office for regular visit and was noted to have altered mental status.  Reports history of benzo dependence and cocaine use.  Pt alert and can answer questions but drowsy.  Pt also is very HOH.  Denies pain.  Pt  Wheezing.

## 2018-06-21 NOTE — ED Notes (Signed)
Respiratory notified of bipap order.

## 2018-06-21 NOTE — ED Notes (Signed)
Phlebotomy at bedside.

## 2018-06-21 NOTE — Progress Notes (Signed)
Critical pCO2 of 68.2 MD notified via text page.

## 2018-06-21 NOTE — ED Provider Notes (Signed)
[facility] EMERGENCY DEPARTMENT Provider Note   CSN: [id number] Arrival date & time: 06/21/18  1154     History   Chief Complaint Chief Complaint  Patient presents with  . Altered Mental Status    HPI Tammy Blair is a 67 y.o. female.  HPI   She is here for evaluation of altered mental status, found today at her PCP office where she had apparently presented for routine visit.  EMS was engaged and brought her here for evaluation.  Patient complains of congestion with cough for several days.  She reports her bowels have been normal and she is not vomiting.  She denies pain.  She states that she is taking her usual medications.  Patient is somewhat sleepy but is responsive to questioning.  No other reported recent problems.  There are no other known modifying factors.   Past Medical History:  Diagnosis Date  . Anxiety   . Asthma   . Chronic back pain   . COPD (chronic obstructive pulmonary disease) ([place])    on home O2  . Diabetes mellitus without complication ([place])   . HOH (hard of hearing)   . Hypertension   . Lumbar radiculopathy     Patient Active Problem List   Diagnosis Date Noted  . COPD (chronic obstructive pulmonary disease) ([place]) 06/21/2018  . Sepsis secondary to UTI ([place]) 10/07/2017  . Tachycardia 10/07/2017  . Cocaine abuse ([place]) 10/07/2017  . Acute respiratory failure with hypercapnia ([place]) 08/11/2016  . Diabetes mellitus ([place]) 08/11/2016  . HOH (hard of hearing) 08/22/2013  . Hyperglycemia 08/21/2013  . COPD exacerbation ([place]) 02/18/2013  . GAD (generalized anxiety disorder) 02/18/2013  . Chronic pain syndrome 02/18/2013  . Tobacco use disorder 02/18/2013  . Chronic respiratory failure ([place]) 02/18/2013  . Obesity, unspecified 02/18/2013  . Essential hypertension 04/29/2009  . COPD UNSPECIFIED 04/29/2009  . WEIGHT GAIN, ABNORMAL 04/29/2009    Past Surgical History:  Procedure Laterality Date  . CESAREAN SECTION    . TONSILLECTOMY       OB  History   None      Home Medications    Prior to Admission medications   Medication Sig Start Date End Date Taking? Authorizing Provider  albuterol (PROVENTIL HFA;VENTOLIN HFA) 108 (90 Base) MCG/ACT inhaler Inhale 1-2 puffs into the lungs every 4 (four) hours as needed for wheezing or shortness of breath. 03/14/16  Yes Mesner, Corene Cornea, MD  albuterol (PROVENTIL) (2.5 MG/3ML) 0.083% nebulizer solution Take 3 mLs (2.5 mg total) by nebulization every 4 (four) hours as needed. For shortness of breath 03/14/16  Yes Mesner, Corene Cornea, MD  ALPRAZolam Duanne Moron) 0.5 MG tablet Take 0.5 mg by mouth 3 (three) times daily as needed for anxiety. For anxiety   Yes [provider]  budesonide-formoterol (SYMBICORT) 160-4.5 MCG/ACT inhaler Inhale 1 puff into the lungs 2 (two) times daily.    Yes [provider]  cyclobenzaprine (FLEXERIL) 10 MG tablet Take 0.5 tablets (5 mg total) by mouth 3 (three) times daily as needed for muscle spasms. For muscle spasm 10/12/17  Yes Kathie Dike, MD  diltiazem (CARDIZEM) 60 MG tablet Take 1 tablet (60 mg total) by mouth 2 (two) times daily. 10/12/17 10/12/18 Yes Kathie Dike, MD  gabapentin (NEURONTIN) 300 MG capsule Take 300 mg by mouth 3 (three) times daily.   Yes [provider]  metFORMIN (GLUCOPHAGE-XR) 500 MG 24 hr tablet Take 500 mg by mouth every evening. With supper.   Yes [provider]  metoprolol tartrate (  LOPRESSOR) 100 MG tablet Take 1 tablet by mouth 2 (two) times daily. 03/23/18  Yes [provider]  ranitidine (ZANTAC) 150 MG tablet Take 150 mg by mouth 2 (two) times daily.   Yes [provider]  tiotropium (SPIRIVA) 18 MCG inhalation capsule Place 18 mcg into inhaler and inhale daily.   Yes [provider]  TRIAMCINOLONE ACETONIDE, TOP, (TRIANEX) 0.05 % OINT Apply 1 application topically 2 (two) times daily as needed. 12/28/15  Yes [provider]  amoxicillin-clavulanate (AUGMENTIN) 875-125 MG  tablet Take 1 tablet by mouth every 12 (twelve) hours. Patient not taking: Reported on 06/21/2018 10/12/17   Kathie Dike, MD  guaiFENesin (MUCINEX) 600 MG 12 hr tablet Take 1 tablet (600 mg total) by mouth 2 (two) times daily. Patient not taking: Reported on 06/21/2018 10/12/17 10/12/18  Kathie Dike, MD  naproxen (NAPROSYN) 500 MG tablet Take 1 tablet (500 mg total) by mouth 2 (two) times daily with a meal. Patient not taking: Reported on 06/21/2018 08/20/17   Noemi Chapel, MD  Oxycodone HCl 10 MG TABS Take 0.5 tablets (5 mg total) by mouth 4 (four) times daily as needed. Patient not taking: Reported on 06/21/2018 10/12/17   Kathie Dike, MD  predniSONE (DELTASONE) 10 MG tablet Take 40mg  po daily for 2 days then 30mg  daily for 2 days then 20mg  daily for 2 days then 10mg  daily for 2 days then stop Patient not taking: Reported on 06/21/2018 10/12/17   Kathie Dike, MD    Family History Family History  Problem Relation Age of Onset  . COPD Mother   . Cancer Mother        kidney  . Stroke Brother   . Asthma Sister     Social History Social History   Tobacco Use  . Smoking status: Current Every Day Smoker    Packs/day: 0.50    Years: 40.00    Pack years: 20.00    Types: Cigarettes  . Smokeless tobacco: Never Used  Substance Use Topics  . Alcohol use: No  . Drug use: Not Currently     Allergies   Patient has no known allergies.   Review of Systems Review of Systems  All other systems reviewed and are negative.    Physical Exam Updated Vital Signs BP (!) 99/42   Pulse 63   Temp 99.8 F (37.7 C) (Rectal)   Resp 13   Ht 5\' 5"  (1.651 m)   Wt 83 kg (183 lb)   SpO2 98%   BMI 30.45 kg/m   Physical Exam  Constitutional: She is oriented to person, place, and time. She appears well-developed and well-nourished. She appears distressed (Sleeping on arrival, awakens to voice.).  Elderly, overweight.  HENT:  Head: Normocephalic and atraumatic.  Eyes: Pupils are equal,  round, and reactive to light. Conjunctivae and EOM are normal.  Neck: Normal range of motion and phonation normal. Neck supple.  Cardiovascular: Normal rate and regular rhythm.  Pulmonary/Chest: Effort normal and breath sounds normal. No respiratory distress (No increased work of breathing). She exhibits no tenderness.  Decreased air movement bilaterally, with generalized rhonchi.  No audible wheezes or rales.  She is on chronic nasal cannula oxygen supplementation.  Abdominal: Soft. She exhibits no distension. There is no tenderness. There is no guarding.  Musculoskeletal: Normal range of motion. She exhibits no edema or deformity.  Neurological: She is alert and oriented to person, place, and time. She exhibits normal muscle tone.  She is dysarthric.  No clear  evidence for a aphasia.  No nystagmus.  Symmetric and nearly normal strength, bilaterally.  Skin: Skin is warm and dry.  Psychiatric: She has a normal mood and affect. Her behavior is normal. Judgment and thought content normal.  Lethargic  Nursing note and vitals reviewed.    ED Treatments / Results  Labs (all labs ordered are listed, but only abnormal results are displayed) Labs Reviewed  COMPREHENSIVE METABOLIC PANEL - Abnormal; Notable for the following components:      Result Value   Glucose, Bld 124 (*)    Calcium 8.8 (*)    All other components within normal limits  CBC WITH DIFFERENTIAL/PLATELET - Abnormal; Notable for the following components:   WBC 15.3 (*)    HCT 46.9 (*)    Neutro Abs 9.7 (*)    Monocytes Absolute 1.4 (*)    Eosinophils Absolute 0.8 (*)    All other components within normal limits  AMMONIA - Abnormal; Notable for the following components:   Ammonia 47 (*)    All other components within normal limits  BLOOD GAS, VENOUS - Abnormal; Notable for the following components:   pCO2, Ven 66.8 (*)    pO2, Ven 79.4 (*)    Acid-Base Excess 4.4 (*)    All other components within normal limits  CULTURE,  BLOOD (ROUTINE X 2)  CULTURE, BLOOD (ROUTINE X 2)  ETHANOL  URINALYSIS, ROUTINE W REFLEX MICROSCOPIC  RAPID URINE DRUG SCREEN, HOSP PERFORMED  I-STAT CG4 LACTIC ACID, ED    EKG EKG Interpretation  Date/Time:  Thursday June 21 2018 12:03:10 EDT Ventricular Rate:  64 PR Interval:    QRS Duration: 92 QT Interval:  398 QTC Calculation: 411 R Axis:   93 Text Interpretation:  Sinus rhythm Right axis deviation Low voltage, precordial leads Minimal ST elevation, inferior leads Since last tracing rate slower and simlar to 08/11/2016 Confirmed by Daleen Bo [phone]) on 06/21/2018 2:27:20 PM   Radiology Dg Chest Port 1 View  Result Date: 06/21/2018 CLINICAL DATA:  Cough and congestion. EXAM: PORTABLE CHEST 1 VIEW COMPARISON:  Chest x-ray dated 10/07/2017. FINDINGS: Heart size is upper normal. Overall cardiomediastinal silhouette is within normal limits. Subtle opacity at the LEFT lung base, pneumonia not excluded. Probable small LEFT pleural effusion, as also seen on earlier CT of 10/08/2017. No pneumothorax seen. Osseous structures about the chest are unremarkable. IMPRESSION: 1. Subtle opacity at the LEFT lung base, pneumonia versus atelectasis. 2. Probable small LEFT pleural effusion. Electronically Signed   By: Franki Cabot M.D.   On: 06/21/2018 14:48    Procedures .Critical Care Performed by: Daleen Bo, MD Authorized by: Daleen Bo, MD   Critical care provider statement:    Critical care time (minutes):  45   Critical care start time:  06/21/2018 12:30 PM   Critical care end time:  06/21/2018 4:28 PM   Critical care time was exclusive of:  Separately billable procedures and treating other patients   Critical care was necessary to treat or prevent imminent or life-threatening deterioration of the following conditions:  Respiratory failure   Critical care was time spent personally by me on the following activities:  Blood draw for specimens, development of treatment plan with  patient or surrogate, discussions with consultants, evaluation of patient's response to treatment, examination of patient, obtaining history from patient or surrogate, ordering and performing treatments and interventions, ordering and review of laboratory studies, pulse oximetry, re-evaluation of patient's condition, review of old charts and ordering and review of radiographic  studies   (including critical care time)  Medications Ordered in ED Medications - No data to display   Initial Impression / Assessment and Plan / ED Course  I have reviewed the triage vital signs and the nursing notes.  Pertinent labs & imaging results that were available during my care of the patient were reviewed by me and considered in my medical decision making (see chart for details).  Clinical Course as of Jun 21 1628  Thu Jun 21, 2018  1523 Normal except glucose high, calcium low  Comprehensive metabolic panel(!) [EW]  4492 Normal  Ethanol [EW]  1523 Normal except white count high  CBC with Differential(!) [EW]  1524 Normal  I-Stat CG4 Lactic Acid, ED [EW]  1524 Consistent with hypercapnia  Blood gas, venous(!) [EW]  1524 BiPAP ordered for hypercapnia with altered mental status   [EW]  1618 Normal  Ammonia(!) [EW]  1618 Normal  Urinalysis, Routine w reflex microscopic [EW]    Clinical Course User Index [EW] Daleen Bo, MD     Patient Vitals for the past 24 hrs:  BP Temp Temp src Pulse Resp SpO2 Height Weight  06/21/18 1531 (!) 99/42 - - 63 13 98 % - -  06/21/18 1500 (!) 117/47 - - 68 19 96 % - -  06/21/18 1400 (!) 124/54 - - 71 (!) 21 96 % - -  06/21/18 1330 (!) 101/50 - - 60 13 96 % - -  06/21/18 1300 (!) 104/42 - - 64 15 96 % - -  06/21/18 1246 - 99.8 F (37.7 C) Rectal - - - - -  06/21/18 1230 (!) 116/48 - - 63 12 98 % - -  06/21/18 1203 - - - - - 93 % - -  06/21/18 1201 (!) 112/45 98.7 F (37.1 C) Oral 63 20 (!) 87 % - -  06/21/18 1200 - - - - - - 5\' 5"  (1.651 m) -  06/21/18  1159 - - - - - - 5' (1.524 m) 83 kg (183 lb)    4:00 PM Reevaluation with update and discussion. After initial assessment and treatment, an updated evaluation reveals patient is more alert now, comfortable on BiPAP.  Patient's son is here now, and he feels like she is "talking more normally."  Son reports patient still smokes, but not quite as much as usual.  Findings discussed with patient and family member, all questions answered. Daleen Bo   Medical Decision Making: Altered mental status likely secondary to hypercapnia, due to chronic lung disease.  She has had previous episodes of hypercapnia which improved after treatment with BiPAP.  Doubt pneumonia, metabolic instability or impending vascular collapse.  Doubt sepsis.  CRITICAL CARE-yes Performed by: Daleen Bo    Nursing Notes Reviewed/ Care Coordinated Applicable Imaging Reviewed Interpretation of Laboratory Data incorporated into ED treatment  4:19 PM-Consult complete with hospitalist. Patient case explained and discussed.  She agrees to admit patient for further evaluation and treatment. Call ended at 4:28 PM  Plan: Admit   Final Clinical Impressions(s) / ED Diagnoses   Final diagnoses:  Acute and chronic respiratory failure with hypercapnia ([place])  Altered mental status, unspecified altered mental status type  Tobacco abuse    ED Discharge Orders    None       Daleen Bo, MD 06/21/18 1705

## 2018-06-21 NOTE — ED Notes (Signed)
In and out catheter attempted with no urine returned.

## 2018-06-21 NOTE — ED Notes (Signed)
X-ray at bedside

## 2018-06-21 NOTE — Progress Notes (Signed)
Transported pt to ICU on BIPAP.

## 2018-06-21 NOTE — ED Notes (Signed)
cbg 167 per ems

## 2018-06-21 NOTE — Progress Notes (Signed)
Per MD, patient to stay on BIPAP. Settings were adjusted to 16/8 and RR of 14. Will continue to monitor.

## 2018-06-22 ENCOUNTER — Encounter (HOSPITAL_COMMUNITY): Payer: Self-pay

## 2018-06-22 DIAGNOSIS — E119 Type 2 diabetes mellitus without complications: Secondary | ICD-10-CM | POA: Diagnosis not present

## 2018-06-22 DIAGNOSIS — J441 Chronic obstructive pulmonary disease with (acute) exacerbation: Principal | ICD-10-CM

## 2018-06-22 DIAGNOSIS — G894 Chronic pain syndrome: Secondary | ICD-10-CM

## 2018-06-22 DIAGNOSIS — I10 Essential (primary) hypertension: Secondary | ICD-10-CM | POA: Diagnosis not present

## 2018-06-22 DIAGNOSIS — F1410 Cocaine abuse, uncomplicated: Secondary | ICD-10-CM

## 2018-06-22 DIAGNOSIS — R4182 Altered mental status, unspecified: Secondary | ICD-10-CM

## 2018-06-22 DIAGNOSIS — Z720 Tobacco use: Secondary | ICD-10-CM | POA: Diagnosis not present

## 2018-06-22 DIAGNOSIS — J9602 Acute respiratory failure with hypercapnia: Secondary | ICD-10-CM | POA: Diagnosis not present

## 2018-06-22 LAB — GLUCOSE, CAPILLARY
Glucose-Capillary: 193 mg/dL — ABNORMAL HIGH (ref 70–99)
Glucose-Capillary: 201 mg/dL — ABNORMAL HIGH (ref 70–99)
Glucose-Capillary: 293 mg/dL — ABNORMAL HIGH (ref 70–99)

## 2018-06-22 LAB — CBC
HCT: 45.2 % (ref 36.0–46.0)
Hemoglobin: 14.6 g/dL (ref 12.0–15.0)
MCH: 31.3 pg (ref 26.0–34.0)
MCHC: 32.3 g/dL (ref 30.0–36.0)
MCV: 96.8 fL (ref 78.0–100.0)
PLATELETS: 280 10*3/uL (ref 150–400)
RBC: 4.67 MIL/uL (ref 3.87–5.11)
RDW: 13.1 % (ref 11.5–15.5)
WBC: 15.5 10*3/uL — ABNORMAL HIGH (ref 4.0–10.5)

## 2018-06-22 LAB — BASIC METABOLIC PANEL
Anion gap: 10 (ref 5–15)
BUN: 9 mg/dL (ref 8–23)
CALCIUM: 9.1 mg/dL (ref 8.9–10.3)
CO2: 33 mmol/L — ABNORMAL HIGH (ref 22–32)
Chloride: 96 mmol/L — ABNORMAL LOW (ref 98–111)
Creatinine, Ser: 0.62 mg/dL (ref 0.44–1.00)
Glucose, Bld: 198 mg/dL — ABNORMAL HIGH (ref 70–99)
Potassium: 4.5 mmol/L (ref 3.5–5.1)
Sodium: 139 mmol/L (ref 135–145)

## 2018-06-22 LAB — MRSA PCR SCREENING: MRSA BY PCR: NEGATIVE

## 2018-06-22 MED ORDER — ALPRAZOLAM 0.5 MG PO TABS
0.5000 mg | ORAL_TABLET | Freq: Three times a day (TID) | ORAL | Status: DC | PRN
  Administered 2018-06-22 – 2018-06-23 (×3): 0.5 mg via ORAL
  Filled 2018-06-22 (×5): qty 1

## 2018-06-22 MED ORDER — CYCLOBENZAPRINE HCL 10 MG PO TABS
5.0000 mg | ORAL_TABLET | Freq: Three times a day (TID) | ORAL | Status: DC | PRN
Start: 2018-06-22 — End: 2018-06-23
  Administered 2018-06-22: 5 mg via ORAL
  Filled 2018-06-22: qty 1

## 2018-06-22 MED ORDER — GUAIFENESIN ER 600 MG PO TB12
1200.0000 mg | ORAL_TABLET | Freq: Two times a day (BID) | ORAL | Status: DC
  Administered 2018-06-22 – 2018-06-23 (×3): 1200 mg via ORAL
  Filled 2018-06-22 (×3): qty 2

## 2018-06-22 MED ORDER — MOMETASONE FURO-FORMOTEROL FUM 200-5 MCG/ACT IN AERO
2.0000 | INHALATION_SPRAY | Freq: Two times a day (BID) | RESPIRATORY_TRACT | Status: DC
  Administered 2018-06-22 – 2018-06-23 (×2): 2 via RESPIRATORY_TRACT
  Filled 2018-06-22: qty 8.8

## 2018-06-22 MED ORDER — INSULIN ASPART 100 UNIT/ML ~~LOC~~ SOLN
0.0000 [IU] | Freq: Three times a day (TID) | SUBCUTANEOUS | Status: DC
  Administered 2018-06-22: 5 [IU] via SUBCUTANEOUS
  Administered 2018-06-22: 3 [IU] via SUBCUTANEOUS
  Administered 2018-06-23 (×2): 5 [IU] via SUBCUTANEOUS

## 2018-06-22 MED ORDER — FAMOTIDINE 20 MG PO TABS
20.0000 mg | ORAL_TABLET | Freq: Two times a day (BID) | ORAL | Status: DC
  Administered 2018-06-22 – 2018-06-23 (×3): 20 mg via ORAL
  Filled 2018-06-22 (×3): qty 1

## 2018-06-22 MED ORDER — OXYCODONE HCL 5 MG PO TABS
5.0000 mg | ORAL_TABLET | Freq: Four times a day (QID) | ORAL | Status: DC | PRN
  Administered 2018-06-22 – 2018-06-23 (×4): 5 mg via ORAL
  Filled 2018-06-22 (×5): qty 1

## 2018-06-22 MED ORDER — INSULIN ASPART 100 UNIT/ML ~~LOC~~ SOLN
0.0000 [IU] | Freq: Every day | SUBCUTANEOUS | Status: DC
  Administered 2018-06-22: 3 [IU] via SUBCUTANEOUS

## 2018-06-22 NOTE — Plan of Care (Signed)
Patient with mild anxiety

## 2018-06-22 NOTE — Plan of Care (Signed)
  Problem: Acute Rehab PT Goals(only PT should resolve) Goal: Patient Will Transfer Sit To/From Stand Outcome: Progressing Flowsheets (Taken 06/22/2018 1608) Patient will transfer sit to/from stand: with modified independence   Problem: Acute Rehab PT Goals(only PT should resolve) Goal: Pt Will Transfer Bed To Chair/Chair To Bed Outcome: Progressing Flowsheets (Taken 06/22/2018 1608) Pt will Transfer Bed to Chair/Chair to Bed: with modified independence   Problem: Acute Rehab PT Goals(only PT should resolve) Goal: Pt Will Ambulate Outcome: Progressing Flowsheets (Taken 06/22/2018 1608) Pt will Ambulate: > 125 feet;with supervision;with rolling walker   4:09 PM, 06/22/18 Lonell Grandchild, MPT Physical Therapist with [facility] [phone] office [phone] mobile phone

## 2018-06-22 NOTE — Progress Notes (Signed)
PROGRESS NOTE    KASIAH MANKA  QPY:[id number] DOB: 1951-05-02 DOA: 06/21/2018 PCP: [name], MD    Brief Narrative:  67 year old female with a history of polysubstance abuse, COPD, hypertension, was sent to the hospital from her PCPs office after it was noted that she was confused.  Blood gas showed elevated PCO2 and she was started on BiPAP.  Overall mentation and respiratory status have improved.   Assessment & Plan:   Principal Problem:   COPD exacerbation ([place]) Active Problems:   Essential hypertension   GAD (generalized anxiety disorder)   Chronic pain syndrome   Chronic respiratory failure ([facility])   Acute respiratory failure with hypercapnia ([facility])   Cocaine abuse ([place])   Acute metabolic encephalopathy   1. Acute respiratory failure with hypercapnia.  Related to COPD exacerbation.  Patient initially started on BiPAP with improvement of symptoms.  Clinically she has improved.  She is now off of BiPAP and on nasal cannula.  We will continue to try to wean off oxygen as tolerated. 2. COPD exacerbation.  Improving with intravenous steroids and antibiotics and bronchodilators.  Continue current treatments. 3. Hypertension.  Blood pressures have improved.  Resume home medications. 4. Anxiety.  Appears to be very anxious at this time.  Will resume home dose of Xanax. 5. Chronic pain syndrome.  Complains of significant pain in her hips.  Will resume home oxycodone. 6. Cocaine abuse.  Drug screen is positive for cocaine.  She denies any cocaine usage. 7. Diabetes.  Metformin currently on hold.  On sliding scale insulin.  Continue to monitor.   DVT prophylaxis: Lovenox Code Status: Full code Family Communication: No family present Disposition Plan: Discharge home once improved   Consultants:     Procedures:     Antimicrobials:   Azithromycin 7/11 >   Subjective: She is feeling better today.  Shortness of breath is better.  He does not have much  cough  Objective: Vitals:   06/22/18 1100 06/22/18 1300 06/22/18 1400 06/22/18 1445  BP: 117/79 121/74 (!) 103/59   Pulse: 70 73 77   Resp: 18 (!) 27 16   Temp:      TempSrc:      SpO2: 94% 96% 95% 93%  Weight:      Height:        Intake/Output Summary (Last 24 hours) at 06/22/2018 1856 Last data filed at 06/21/2018 2300 Gross per 24 hour  Intake 495.83 ml  Output -  Net 495.83 ml   Filed Weights   06/21/18 1159 06/22/18 0111  Weight: 83 kg (183 lb) 84.3 kg (185 lb 13.6 oz)    Examination:  General exam: Appears calm and comfortable  Respiratory system: diminished breath sounds bilaterally. Respiratory effort normal. Cardiovascular system: S1 & S2 heard, RRR. No JVD, murmurs, rubs, gallops or clicks. No pedal edema. Gastrointestinal system: Abdomen is nondistended, soft and nontender. No organomegaly or masses felt. Normal bowel sounds heard. Central nervous system: Alert and oriented. No focal neurological deficits. Extremities: Symmetric 5 x 5 power. Skin: No rashes, lesions or ulcers Psychiatry: Judgement and insight appear normal. Mood & affect appropriate.     Data Reviewed: I have personally reviewed following labs and imaging studies  CBC: Recent Labs  Lab 06/21/18 1348 06/22/18 0403  WBC 15.3* 15.5*  NEUTROABS 9.7*  --   HGB 15.0 14.6  HCT 46.9* 45.2  MCV 97.9 96.8  PLT 266 124   Basic Metabolic Panel: Recent Labs  Lab 06/21/18 1348 06/22/18 0403  NA 137 139  K 5.0 4.5  CL 100 96*  CO2 30 33*  GLUCOSE 124* 198*  BUN 9 9  CREATININE 0.64 0.62  CALCIUM 8.8* 9.1   GFR: Estimated Creatinine Clearance: 75.1 mL/min (by C-G formula based on SCr of 0.62 mg/dL). Liver Function Tests: Recent Labs  Lab 06/21/18 1348  AST 33  ALT 26  ALKPHOS 54  BILITOT 0.4  PROT 7.4  ALBUMIN 3.9   No results for input(s): LIPASE, AMYLASE in the last 168 hours. Recent Labs  Lab 06/21/18 1348  AMMONIA 47*   Coagulation Profile: No results for input(s):  INR, PROTIME in the last 168 hours. Cardiac Enzymes: No results for input(s): CKTOTAL, CKMB, CKMBINDEX, TROPONINI in the last 168 hours. BNP (last 3 results) No results for input(s): PROBNP in the last 8760 hours. HbA1C: No results for input(s): HGBA1C in the last 72 hours. CBG: Recent Labs  Lab 06/22/18 1146 06/22/18 1634  GLUCAP 193* 201*   Lipid Profile: No results for input(s): CHOL, HDL, LDLCALC, TRIG, CHOLHDL, LDLDIRECT in the last 72 hours. Thyroid Function Tests: No results for input(s): TSH, T4TOTAL, FREET4, T3FREE, THYROIDAB in the last 72 hours. Anemia Panel: No results for input(s): VITAMINB12, FOLATE, FERRITIN, TIBC, IRON, RETICCTPCT in the last 72 hours. Sepsis Labs: Recent Labs  Lab 06/21/18 1345  LATICACIDVEN 1.26    Recent Results (from the past 240 hour(s))  Culture, blood (routine x 2)     Status: None (Preliminary result)   Collection Time: 06/21/18  1:48 PM  Result Value Ref Range Status   Specimen Description RIGHT ANTECUBITAL DRAWN BY RN  Final   Special Requests   Final    BOTTLES DRAWN AEROBIC AND ANAEROBIC Blood Culture adequate volume   Culture   Final    NO GROWTH < 24 HOURS Performed at [facility], [address], [place], [place] [address]    Report Status PENDING  Incomplete  Culture, blood (routine x 2)     Status: None (Preliminary result)   Collection Time: 06/21/18  1:48 PM  Result Value Ref Range Status   Specimen Description BLOOD RIGHT HAND DRAWN BY RN  Final   Special Requests   Final    BOTTLES DRAWN AEROBIC AND ANAEROBIC Blood Culture adequate volume   Culture   Final    NO GROWTH < 24 HOURS Performed at [facility], [address], [place], [place] [address]    Report Status PENDING  Incomplete  MRSA PCR Screening     Status: None   Collection Time: 06/21/18  8:08 PM  Result Value Ref Range Status   MRSA by PCR NEGATIVE NEGATIVE Final    Comment:        The GeneXpert MRSA Assay (FDA approved for NASAL specimens only),  is one component of a comprehensive MRSA colonization surveillance program. It is not intended to diagnose MRSA infection nor to guide or monitor treatment for MRSA infections. Performed at [facility], [address], [place], [place] [address]          Radiology Studies: Dg Chest [facility] 1 View  Result Date: 06/21/2018 CLINICAL DATA:  Cough and congestion. EXAM: PORTABLE CHEST 1 VIEW COMPARISON:  Chest x-ray dated 10/07/2017. FINDINGS: Heart size is upper normal. Overall cardiomediastinal silhouette is within normal limits. Subtle opacity at the LEFT lung base, pneumonia not excluded. Probable small LEFT pleural effusion, as also seen on earlier CT of 10/08/2017. No pneumothorax seen. Osseous structures about the chest are unremarkable. IMPRESSION: 1.  Subtle opacity at the LEFT lung base, pneumonia versus atelectasis. 2. Probable small LEFT pleural effusion. Electronically Signed   By: Franki Cabot M.D.   On: 06/21/2018 14:48        Scheduled Meds: . diltiazem  60 mg Oral BID  . enoxaparin (LOVENOX) injection  40 mg Subcutaneous Q24H  . famotidine  20 mg Oral BID  . gabapentin  300 mg Oral TID  . guaiFENesin  1,200 mg Oral BID  . insulin aspart  0-15 Units Subcutaneous TID WC  . insulin aspart  0-5 Units Subcutaneous QHS  . ipratropium-albuterol  3 mL Nebulization TID  . methylPREDNISolone (SOLU-MEDROL) injection  80 mg Intravenous Q12H  . metoprolol tartrate  100 mg Oral BID  . mometasone-formoterol  2 puff Inhalation BID  . sodium chloride flush  3 mL Intravenous Q12H   Continuous Infusions: . sodium chloride    . azithromycin 500 mg (06/22/18 1759)     LOS: 1 day    Time spent: 50mins    Kathie Dike, MD [facility] Pager [phone]  If 7PM-7AM, please contact night-coverage [email or web address] Password [facility] 06/22/2018, 6:56 PM

## 2018-06-22 NOTE — Evaluation (Signed)
Physical Therapy Evaluation Patient Details Name: Tammy Blair MRN: [id number] DOB: June 04, 1951 Today's Date: 06/22/2018   History of Present Illness   Tammy Blair is a 67 y.o. female with medical history significant of polysubstance abuse, COPD, hypertension, anxiety, asthma comes in from PCPs office for visit after being noted to have altered mental status was sent here via EMS.  Patient is currently on BiPAP.  Her confusion seems to have gotten better but she still not back to baseline.  She is still confused.  History is unreliable.  She denies any fevers nausea vomiting or diarrhea.  She also denies cocaine usage even though it and her urine drug screen.  Patient referred for admission for COPD exacerbation with acute hypercapnic respiratory failure.  Patient is on home oxygen.    Clinical Impression  Patient functioning near baseline for functional mobility and gait, demonstrates slightly unsteady cadence during gait training without loss of balance, easily distracted and very impulsive.  Patient will benefit from continued physical therapy in hospital to increase strength, balance, endurance for safe ADLs and gait.    Follow Up Recommendations No PT follow up;Supervision for mobility/OOB    Equipment Recommendations  3in1 (PT)    Recommendations for Other Services       Precautions / Restrictions Precautions Precautions: Fall Restrictions Weight Bearing Restrictions: No      Mobility  Bed Mobility Overal bed mobility: Independent                Transfers Overall transfer level: Needs assistance Equipment used: Rolling walker (2 wheeled) Transfers: Sit to/from [employer] Transfers Sit to Stand: Supervision Stand pivot transfers: Supervision          Ambulation/Gait Ambulation/Gait assistance: Supervision Gait Distance (Feet): 80 Feet Assistive device: Rolling walker (2 wheeled) Gait Pattern/deviations: Decreased step length - right;Decreased step length  - left;Decreased stride length;Scissoring Gait velocity: slow   General Gait Details: slightly unsteady cadence with occasional scissoring of legs without loss of balance, easily distracted  Stairs            Wheelchair Mobility    Modified Rankin (Stroke Patients Only)       Balance Overall balance assessment: Needs assistance Sitting-balance support: Feet supported;No upper extremity supported Sitting balance-Leahy Scale: Good     Standing balance support: Bilateral upper extremity supported;During functional activity Standing balance-Leahy Scale: Fair                               Pertinent Vitals/Pain Pain Assessment: No/denies pain    Home Living Family/patient expects to be discharged to:: Private residence Living Arrangements: Children Available Help at Discharge: Family Type of Home: Mobile home Home Access: Stairs to enter Entrance Stairs-Rails: Right;Left;Can reach both Entrance Stairs-Number of Steps: 4 Home Layout: One level Home Equipment: [place] - 4 wheels;Cane - single point;Shower seat      Prior Function Level of Independence: Independent with assistive device(s)         Comments: hosuehold ambulator     Hand Dominance        Extremity/Trunk Assessment   Upper Extremity Assessment Upper Extremity Assessment: Overall WFL for tasks assessed    Lower Extremity Assessment Lower Extremity Assessment: Generalized weakness    Cervical / Trunk Assessment Cervical / Trunk Assessment: Normal  Communication   Communication: No difficulties  Cognition Arousal/Alertness: Awake/alert Behavior During Therapy: WFL for tasks assessed/performed;Impulsive Overall Cognitive Status: Within Functional Limits for tasks  assessed                                        General Comments      Exercises     Assessment/Plan    PT Assessment Patient needs continued PT services  PT Problem List Decreased  strength;Decreased activity tolerance;Decreased mobility;Decreased balance       PT Treatment Interventions Gait training;Stair training;Functional mobility training;Therapeutic activities;Therapeutic exercise;Patient/family education    PT Goals (Current goals can be found in the Care Plan section)  Acute Rehab PT Goals Patient Stated Goal: return home with family to assist PT Goal Formulation: With patient/family Time For Goal Achievement: 06/29/18 Potential to Achieve Goals: Good    Frequency Min 3X/week   Barriers to discharge        Co-evaluation               AM-PAC PT "6 Clicks" Daily Activity  Outcome Measure Difficulty turning over in bed (including adjusting bedclothes, sheets and blankets)?: None Difficulty moving from lying on back to sitting on the side of the bed? : None Difficulty sitting down on and standing up from a chair with arms (e.g., wheelchair, bedside commode, etc,.)?: None Help needed moving to and from a bed to chair (including a wheelchair)?: A Little Help needed walking in hospital room?: A Little Help needed climbing 3-5 steps with a railing? : A Little 6 Click Score: 21    End of Session   Activity Tolerance: Patient tolerated treatment well Patient left: in bed;with call bell/phone within reach;with family/visitor present Nurse Communication: Mobility status PT Visit Diagnosis: Unsteadiness on feet (R26.81);Other abnormalities of gait and mobility (R26.89);Muscle weakness (generalized) (M62.81)    Time: 6387-5643 PT Time Calculation (min) (ACUTE ONLY): 29 min   Charges:   PT Evaluation $PT Eval Moderate Complexity: 1 Mod PT Treatments $Therapeutic Activity: 23-37 mins   PT G Codes:        4:07 PM, 07-19-18 Lonell Grandchild, MPT Physical Therapist with [facility] [phone] office [phone] mobile phone

## 2018-06-22 NOTE — Care Management Important Message (Signed)
Important Message  Patient Details  Name: Tammy Blair MRN: [id number] Date of Birth: 04-10-1951   Medicare Important Message Given:  Yes    Shelda Altes 06/22/2018, 12:24 PM

## 2018-06-22 NOTE — Progress Notes (Signed)
Patient had taken BIPAP off upon RT arrival to unit. Patient placed back on BIPAP and encouraged not to remove mask. Patient's family at bedside. Room door left halfway open so that staff could keep a closer eye on patient. Will continue to monitor.

## 2018-06-23 DIAGNOSIS — I10 Essential (primary) hypertension: Secondary | ICD-10-CM | POA: Diagnosis not present

## 2018-06-23 DIAGNOSIS — G9341 Metabolic encephalopathy: Secondary | ICD-10-CM | POA: Diagnosis not present

## 2018-06-23 DIAGNOSIS — J9622 Acute and chronic respiratory failure with hypercapnia: Secondary | ICD-10-CM

## 2018-06-23 DIAGNOSIS — G894 Chronic pain syndrome: Secondary | ICD-10-CM | POA: Diagnosis not present

## 2018-06-23 DIAGNOSIS — F1410 Cocaine abuse, uncomplicated: Secondary | ICD-10-CM | POA: Diagnosis not present

## 2018-06-23 DIAGNOSIS — Z720 Tobacco use: Secondary | ICD-10-CM | POA: Diagnosis not present

## 2018-06-23 DIAGNOSIS — E119 Type 2 diabetes mellitus without complications: Secondary | ICD-10-CM | POA: Diagnosis not present

## 2018-06-23 DIAGNOSIS — J441 Chronic obstructive pulmonary disease with (acute) exacerbation: Secondary | ICD-10-CM | POA: Diagnosis not present

## 2018-06-23 LAB — GLUCOSE, CAPILLARY
GLUCOSE-CAPILLARY: 239 mg/dL — AB (ref 70–99)
Glucose-Capillary: 211 mg/dL — ABNORMAL HIGH (ref 70–99)

## 2018-06-23 MED ORDER — PREDNISONE 10 MG PO TABS: ORAL_TABLET | ORAL | 0 refills | Status: DC

## 2018-06-23 MED ORDER — AZITHROMYCIN 500 MG PO TABS: 500.0000 mg | ORAL_TABLET | Freq: Every day | ORAL | 0 refills | Status: AC

## 2018-06-23 MED ORDER — ALBUTEROL SULFATE (2.5 MG/3ML) 0.083% IN NEBU: 2.5000 mg | INHALATION_SOLUTION | RESPIRATORY_TRACT | 0 refills | Status: DC | PRN

## 2018-06-23 MED ORDER — TIOTROPIUM BROMIDE MONOHYDRATE 18 MCG IN CAPS: 18.0000 ug | ORAL_CAPSULE | Freq: Every day | RESPIRATORY_TRACT | 12 refills | Status: DC

## 2018-06-23 MED ORDER — GUAIFENESIN ER 600 MG PO TB12: 600.0000 mg | ORAL_TABLET | Freq: Two times a day (BID) | ORAL | 2 refills | Status: AC

## 2018-06-23 MED ORDER — BUDESONIDE-FORMOTEROL FUMARATE 160-4.5 MCG/ACT IN AERO: 1.0000 | INHALATION_SPRAY | Freq: Two times a day (BID) | RESPIRATORY_TRACT | 12 refills | Status: DC

## 2018-06-23 NOTE — Discharge Summary (Signed)
Physician Discharge Summary  Tammy Blair AYT:[id number] DOB: November 11, 1951 DOA: 06/21/2018  PCP: [name], MD  Admit date: 06/21/2018 Discharge date: 06/23/2018  Admitted From: home Disposition:  home  Recommendations for Outpatient Follow-up:  1. Follow up with PCP in 1-2 weeks 2. Please obtain BMP/CBC in one week  Discharge Condition:stable CODE STATUS:full code Diet recommendation: heart healthy, carb modified  Brief/Interim Summary: 67 year old female with a history of polysubstance abuse, COPD, hypertension, was sent to the hospital from her PCPs office after it was noted that she was confused.  Blood gas showed elevated PCO2 and she was started on BiPAP.   Discharge Diagnoses:  Principal Problem:   COPD exacerbation ([place]) Active Problems:   Essential hypertension   GAD (generalized anxiety disorder)   Chronic pain syndrome   Chronic respiratory failure ([facility])   Acute respiratory failure with hypercapnia ([facility])   Cocaine abuse ([place])   Acute metabolic encephalopathy   Type 2 diabetes mellitus without complication ([place])  1. Acute respiratory failure with hypercapnia.  Related to COPD exacerbation.  Patient initially started on BiPAP with improvement of symptoms.  Clinically she has improved.  She was weaned off of BiPAP and back down to her baseline oxygen requirement on nasal cannula.   2. COPD exacerbation.  Improved with intravenous steroids and antibiotics and bronchodilators. Transition steroids to prednisone taper. Complete course of azithromycin. 3. Hypertension.  stable on home regimen of metoprolol and diltiazem 4. Anxiety.  stable on home dose of xanax 5. Chronic pain syndrome.  Complains of significant pain in her hips.  Will resume home oxycodone. 6. Cocaine abuse.  Drug screen is positive for cocaine.  She denies any cocaine usage. 7. Diabetes. resume metformin on discharge. Blood sugars should improve as steroids are tapered .     Discharge  Instructions  Discharge Instructions    Diet - low sodium heart healthy   Complete by:  As directed    Increase activity slowly   Complete by:  As directed      Allergies as of 06/23/2018   No Known Allergies     Medication List    STOP taking these medications   amoxicillin-clavulanate 875-125 MG tablet Commonly known as:  AUGMENTIN     TAKE these medications   albuterol 108 (90 Base) MCG/ACT inhaler Commonly known as:  PROVENTIL HFA;VENTOLIN HFA Inhale 1-2 puffs into the lungs every 4 (four) hours as needed for wheezing or shortness of breath.   albuterol (2.5 MG/3ML) 0.083% nebulizer solution Commonly known as:  PROVENTIL Take 3 mLs (2.5 mg total) by nebulization every 4 (four) hours as needed. For shortness of breath   ALPRAZolam 0.5 MG tablet Commonly known as:  XANAX Take 0.5 mg by mouth 3 (three) times daily as needed for anxiety. For anxiety   azithromycin 500 MG tablet Commonly known as:  ZITHROMAX Take 1 tablet (500 mg total) by mouth daily for 3 days. Take 1 tablet daily for 3 days.   budesonide-formoterol 160-4.5 MCG/ACT inhaler Commonly known as:  SYMBICORT Inhale 1 puff into the lungs 2 (two) times daily.   cyclobenzaprine 10 MG tablet Commonly known as:  FLEXERIL Take 0.5 tablets (5 mg total) by mouth 3 (three) times daily as needed for muscle spasms. For muscle spasm   diltiazem 60 MG tablet Commonly known as:  CARDIZEM Take 1 tablet (60 mg total) by mouth 2 (two) times daily.   gabapentin 300 MG capsule Commonly known as:  NEURONTIN Take 300 mg by mouth  3 (three) times daily.   guaiFENesin 600 MG 12 hr tablet Commonly known as:  MUCINEX Take 1 tablet (600 mg total) by mouth 2 (two) times daily.   metFORMIN 500 MG 24 hr tablet Commonly known as:  GLUCOPHAGE-XR Take 500 mg by mouth every evening. With supper.   metoprolol tartrate 100 MG tablet Commonly known as:  LOPRESSOR Take 1 tablet by mouth 2 (two) times daily.   naproxen 500 MG  tablet Commonly known as:  NAPROSYN Take 1 tablet (500 mg total) by mouth 2 (two) times daily with a meal.   Oxycodone HCl 10 MG Tabs Take 0.5 tablets (5 mg total) by mouth 4 (four) times daily as needed.   predniSONE 10 MG tablet Commonly known as:  DELTASONE Take 40mg  po daily for 2 days then 30mg  daily for 2 days then 20mg  daily for 2 days then 10mg  daily for 2 days then stop   ranitidine 150 MG tablet Commonly known as:  ZANTAC Take 150 mg by mouth 2 (two) times daily.   tiotropium 18 MCG inhalation capsule Commonly known as:  SPIRIVA Place 1 capsule (18 mcg total) into inhaler and inhale daily. What changed:  when to take this   TRIANEX 0.05 % Oint Generic drug:  TRIAMCINOLONE ACETONIDE (TOP) Apply 1 application topically 2 (two) times daily as needed.       No Known Allergies  Consultations:     Procedures/Studies: Dg Chest Port 1 View  Result Date: 06/21/2018 CLINICAL DATA:  Cough and congestion. EXAM: PORTABLE CHEST 1 VIEW COMPARISON:  Chest x-ray dated 10/07/2017. FINDINGS: Heart size is upper normal. Overall cardiomediastinal silhouette is within normal limits. Subtle opacity at the LEFT lung base, pneumonia not excluded. Probable small LEFT pleural effusion, as also seen on earlier CT of 10/08/2017. No pneumothorax seen. Osseous structures about the chest are unremarkable. IMPRESSION: 1. Subtle opacity at the LEFT lung base, pneumonia versus atelectasis. 2. Probable small LEFT pleural effusion. Electronically Signed   By: Franki Cabot M.D.   On: 06/21/2018 14:48       Subjective: Shortness of breath is better. No wheezing  Discharge Exam: Vitals:   06/23/18 0732 06/23/18 0830  BP:  108/90  Pulse:  81  Resp:    Temp:  97.6 F (36.4 C)  SpO2: 90% 94%   Vitals:   06/23/18 0609 06/23/18 0725 06/23/18 0732 06/23/18 0830  BP: 132/78   108/90  Pulse: 78   81  Resp: 13     Temp: 98.1 F (36.7 C)   97.6 F (36.4 C)  TempSrc: Oral   Oral  SpO2: 94%  90% 90% 94%  Weight:      Height:        General: Pt is alert, awake, not in acute distress Cardiovascular: RRR, S1/S2 +, no rubs, no gallops Respiratory: CTA bilaterally, no wheezing, no rhonchi Abdominal: Soft, NT, ND, bowel sounds + Extremities: no edema, no cyanosis    The results of significant diagnostics from this hospitalization (including imaging, microbiology, ancillary and laboratory) are listed below for reference.     Microbiology: Recent Results (from the past 240 hour(s))  Culture, blood (routine x 2)     Status: None (Preliminary result)   Collection Time: 06/21/18  1:48 PM  Result Value Ref Range Status   Specimen Description RIGHT ANTECUBITAL DRAWN BY RN  Final   Special Requests   Final    BOTTLES DRAWN AEROBIC AND ANAEROBIC Blood Culture adequate volume   Culture  Final    NO GROWTH < 24 HOURS Performed at [facility], [address], [place], [place] [address]    Report Status PENDING  Incomplete  Culture, blood (routine x 2)     Status: None (Preliminary result)   Collection Time: 06/21/18  1:48 PM  Result Value Ref Range Status   Specimen Description BLOOD RIGHT HAND DRAWN BY RN  Final   Special Requests   Final    BOTTLES DRAWN AEROBIC AND ANAEROBIC Blood Culture adequate volume   Culture   Final    NO GROWTH < 24 HOURS Performed at [facility], [address], [place], [place] [address]    Report Status PENDING  Incomplete  MRSA PCR Screening     Status: None   Collection Time: 06/21/18  8:08 PM  Result Value Ref Range Status   MRSA by PCR NEGATIVE NEGATIVE Final    Comment:        The GeneXpert MRSA Assay (FDA approved for NASAL specimens only), is one component of a comprehensive MRSA colonization surveillance program. It is not intended to diagnose MRSA infection nor to guide or monitor treatment for MRSA infections. Performed at [facility], [address], [place], [place] [address]      Labs: BNP (last 3 results) No results  for input(s): BNP in the last 8760 hours. Basic Metabolic Panel: Recent Labs  Lab 06/21/18 1348 06/22/18 0403  NA 137 139  K 5.0 4.5  CL 100 96*  CO2 30 33*  GLUCOSE 124* 198*  BUN 9 9  CREATININE 0.64 0.62  CALCIUM 8.8* 9.1   Liver Function Tests: Recent Labs  Lab 06/21/18 1348  AST 33  ALT 26  ALKPHOS 54  BILITOT 0.4  PROT 7.4  ALBUMIN 3.9   No results for input(s): LIPASE, AMYLASE in the last 168 hours. Recent Labs  Lab 06/21/18 1348  AMMONIA 47*   CBC: Recent Labs  Lab 06/21/18 1348 06/22/18 0403  WBC 15.3* 15.5*  NEUTROABS 9.7*  --   HGB 15.0 14.6  HCT 46.9* 45.2  MCV 97.9 96.8  PLT 266 280   Cardiac Enzymes: No results for input(s): CKTOTAL, CKMB, CKMBINDEX, TROPONINI in the last 168 hours. BNP: Invalid input(s): POCBNP CBG: Recent Labs  Lab 06/22/18 1146 06/22/18 1634 06/22/18 2113 06/23/18 0732  GLUCAP 193* 201* 293* 211*   D-Dimer No results for input(s): DDIMER in the last 72 hours. Hgb A1c No results for input(s): HGBA1C in the last 72 hours. Lipid Profile No results for input(s): CHOL, HDL, LDLCALC, TRIG, CHOLHDL, LDLDIRECT in the last 72 hours. Thyroid function studies No results for input(s): TSH, T4TOTAL, T3FREE, THYROIDAB in the last 72 hours.  Invalid input(s): FREET3 Anemia work up No results for input(s): VITAMINB12, FOLATE, FERRITIN, TIBC, IRON, RETICCTPCT in the last 72 hours. Urinalysis    Component Value Date/Time   COLORURINE YELLOW 06/21/2018 1233   APPEARANCEUR CLEAR 06/21/2018 1233   LABSPEC 1.019 06/21/2018 1233   PHURINE 5.0 06/21/2018 1233   GLUCOSEU NEGATIVE 06/21/2018 1233   HGBUR NEGATIVE 06/21/2018 1233   BILIRUBINUR NEGATIVE 06/21/2018 1233   KETONESUR NEGATIVE 06/21/2018 1233   PROTEINUR NEGATIVE 06/21/2018 1233   UROBILINOGEN 0.2 07/17/2014 1613   NITRITE NEGATIVE 06/21/2018 1233   LEUKOCYTESUR NEGATIVE 06/21/2018 1233   Sepsis Labs Invalid input(s): PROCALCITONIN,  WBC,   LACTICIDVEN Microbiology Recent Results (from the past 240 hour(s))  Culture, blood (routine x 2)     Status: None (Preliminary result)   Collection Time:  06/21/18  1:48 PM  Result Value Ref Range Status   Specimen Description RIGHT ANTECUBITAL DRAWN BY RN  Final   Special Requests   Final    BOTTLES DRAWN AEROBIC AND ANAEROBIC Blood Culture adequate volume   Culture   Final    NO GROWTH < 24 HOURS Performed at [facility], [address] [place], [place] [address]    Report Status PENDING  Incomplete  Culture, blood (routine x 2)     Status: None (Preliminary result)   Collection Time: 06/21/18  1:48 PM  Result Value Ref Range Status   Specimen Description BLOOD RIGHT HAND DRAWN BY RN  Final   Special Requests   Final    BOTTLES DRAWN AEROBIC AND ANAEROBIC Blood Culture adequate volume   Culture   Final    NO GROWTH < 24 HOURS Performed at [facility], [address] [place], [place] [address]    Report Status PENDING  Incomplete  MRSA PCR Screening     Status: None   Collection Time: 06/21/18  8:08 PM  Result Value Ref Range Status   MRSA by PCR NEGATIVE NEGATIVE Final    Comment:        The GeneXpert MRSA Assay (FDA approved for NASAL specimens only), is one component of a comprehensive MRSA colonization surveillance program. It is not intended to diagnose MRSA infection nor to guide or monitor treatment for MRSA infections. Performed at [facility], [address] [place], [place] [address]      Time coordinating discharge: 18mins  SIGNED:   Kathie Dike, MD  [facility] 06/23/2018, 10:36 AM Pager   If 7PM-7AM, please contact night-coverage [email or web address] Password [email or web address]

## 2018-06-23 NOTE — Progress Notes (Signed)
Pt IV removed, WNL. D/C instructions explained to pt, verbalized understanding. Pt medications and money returned. Pt awaiting granddaughter to transport home.

## 2018-06-26 LAB — CULTURE, BLOOD (ROUTINE X 2)
CULTURE: NO GROWTH
Culture: NO GROWTH
SPECIAL REQUESTS: ADEQUATE
Special Requests: ADEQUATE

## 2018-08-02 ENCOUNTER — Ambulatory Visit (INDEPENDENT_AMBULATORY_CARE_PROVIDER_SITE_OTHER): Payer: Medicare Other | Admitting: Otolaryngology

## 2018-08-02 DIAGNOSIS — H7012 Chronic mastoiditis, left ear: Secondary | ICD-10-CM

## 2018-08-02 DIAGNOSIS — H7122 Cholesteatoma of mastoid, left ear: Secondary | ICD-10-CM

## 2018-09-17 ENCOUNTER — Encounter (HOSPITAL_COMMUNITY): Payer: Self-pay | Admitting: Emergency Medicine

## 2018-09-17 ENCOUNTER — Emergency Department (HOSPITAL_COMMUNITY): Payer: Medicare Other

## 2018-09-17 ENCOUNTER — Observation Stay (HOSPITAL_COMMUNITY)
Admission: EM | Admit: 2018-09-17 | Discharge: 2018-09-18 | Disposition: A | Discharge status: Home / Self Care | Payer: Medicare Other | Attending: Internal Medicine | Admitting: Internal Medicine

## 2018-09-17 ENCOUNTER — Other Ambulatory Visit: Payer: Self-pay

## 2018-09-17 DIAGNOSIS — I10 Essential (primary) hypertension: Secondary | ICD-10-CM | POA: Diagnosis not present

## 2018-09-17 DIAGNOSIS — J441 Chronic obstructive pulmonary disease with (acute) exacerbation: Principal | ICD-10-CM | POA: Diagnosis present

## 2018-09-17 DIAGNOSIS — F411 Generalized anxiety disorder: Secondary | ICD-10-CM | POA: Diagnosis not present

## 2018-09-17 DIAGNOSIS — R509 Fever, unspecified: Secondary | ICD-10-CM | POA: Diagnosis not present

## 2018-09-17 DIAGNOSIS — J9610 Chronic respiratory failure, unspecified whether with hypoxia or hypercapnia: Secondary | ICD-10-CM | POA: Diagnosis not present

## 2018-09-17 DIAGNOSIS — F17210 Nicotine dependence, cigarettes, uncomplicated: Secondary | ICD-10-CM | POA: Diagnosis not present

## 2018-09-17 DIAGNOSIS — Z7984 Long term (current) use of oral hypoglycemic drugs: Secondary | ICD-10-CM | POA: Diagnosis not present

## 2018-09-17 DIAGNOSIS — J189 Pneumonia, unspecified organism: Secondary | ICD-10-CM

## 2018-09-17 DIAGNOSIS — E119 Type 2 diabetes mellitus without complications: Secondary | ICD-10-CM | POA: Diagnosis not present

## 2018-09-17 DIAGNOSIS — R0602 Shortness of breath: Secondary | ICD-10-CM | POA: Diagnosis present

## 2018-09-17 DIAGNOSIS — Z79899 Other long term (current) drug therapy: Secondary | ICD-10-CM | POA: Diagnosis not present

## 2018-09-17 LAB — CBC WITH DIFFERENTIAL/PLATELET
BASOS ABS: 0 10*3/uL (ref 0.0–0.1)
BASOS PCT: 0 %
EOS ABS: 0.7 10*3/uL (ref 0.0–0.7)
Eosinophils Relative: 3 %
HEMATOCRIT: 43.7 % (ref 36.0–46.0)
HEMOGLOBIN: 14.3 g/dL (ref 12.0–15.0)
Lymphocytes Relative: 9 %
Lymphs Abs: 1.9 10*3/uL (ref 0.7–4.0)
MCH: 31.6 pg (ref 26.0–34.0)
MCHC: 32.7 g/dL (ref 30.0–36.0)
MCV: 96.7 fL (ref 78.0–100.0)
Monocytes Absolute: 1.2 10*3/uL — ABNORMAL HIGH (ref 0.1–1.0)
Monocytes Relative: 6 %
NEUTROS ABS: 16.5 10*3/uL — AB (ref 1.7–7.7)
Neutrophils Relative %: 82 %
Platelets: 268 10*3/uL (ref 150–400)
RBC: 4.52 MIL/uL (ref 3.87–5.11)
RDW: 12.8 % (ref 11.5–15.5)
WBC: 20.3 10*3/uL — AB (ref 4.0–10.5)

## 2018-09-17 LAB — BASIC METABOLIC PANEL
ANION GAP: 9 (ref 5–15)
BUN: 8 mg/dL (ref 8–23)
CHLORIDE: 92 mmol/L — AB (ref 98–111)
CO2: 33 mmol/L — AB (ref 22–32)
Calcium: 9.1 mg/dL (ref 8.9–10.3)
Creatinine, Ser: 0.67 mg/dL (ref 0.44–1.00)
GFR calc non Af Amer: >60 mL/min (ref >60)
Glucose, Bld: 139 mg/dL — ABNORMAL HIGH (ref 70–99)
Potassium: 4 mmol/L (ref 3.5–5.1)
SODIUM: 134 mmol/L — AB (ref 135–145)

## 2018-09-17 LAB — GLUCOSE, CAPILLARY
GLUCOSE-CAPILLARY: 121 mg/dL — AB (ref 70–99)
Glucose-Capillary: 212 mg/dL — ABNORMAL HIGH (ref 70–99)

## 2018-09-17 LAB — INFLUENZA PANEL BY PCR (TYPE A & B)
INFLAPCR: NEGATIVE
Influenza B By PCR: NEGATIVE

## 2018-09-17 LAB — TROPONIN I

## 2018-09-17 LAB — I-STAT CG4 LACTIC ACID, ED: LACTIC ACID, VENOUS: 1.72 mmol/L (ref 0.5–1.9)

## 2018-09-17 LAB — D-DIMER, QUANTITATIVE (NOT AT ARMC): D DIMER QUANT: 0.3 ug{FEU}/mL (ref 0.00–0.50)

## 2018-09-17 IMAGING — DX DG CHEST 2V
2 series · 2 of 2 positions shown · non-contrast
Comparison: [DATE]

CLINICAL DATA: Cough and fever for 2 days.

EXAM:
CHEST - 2 VIEW

[chest lat]
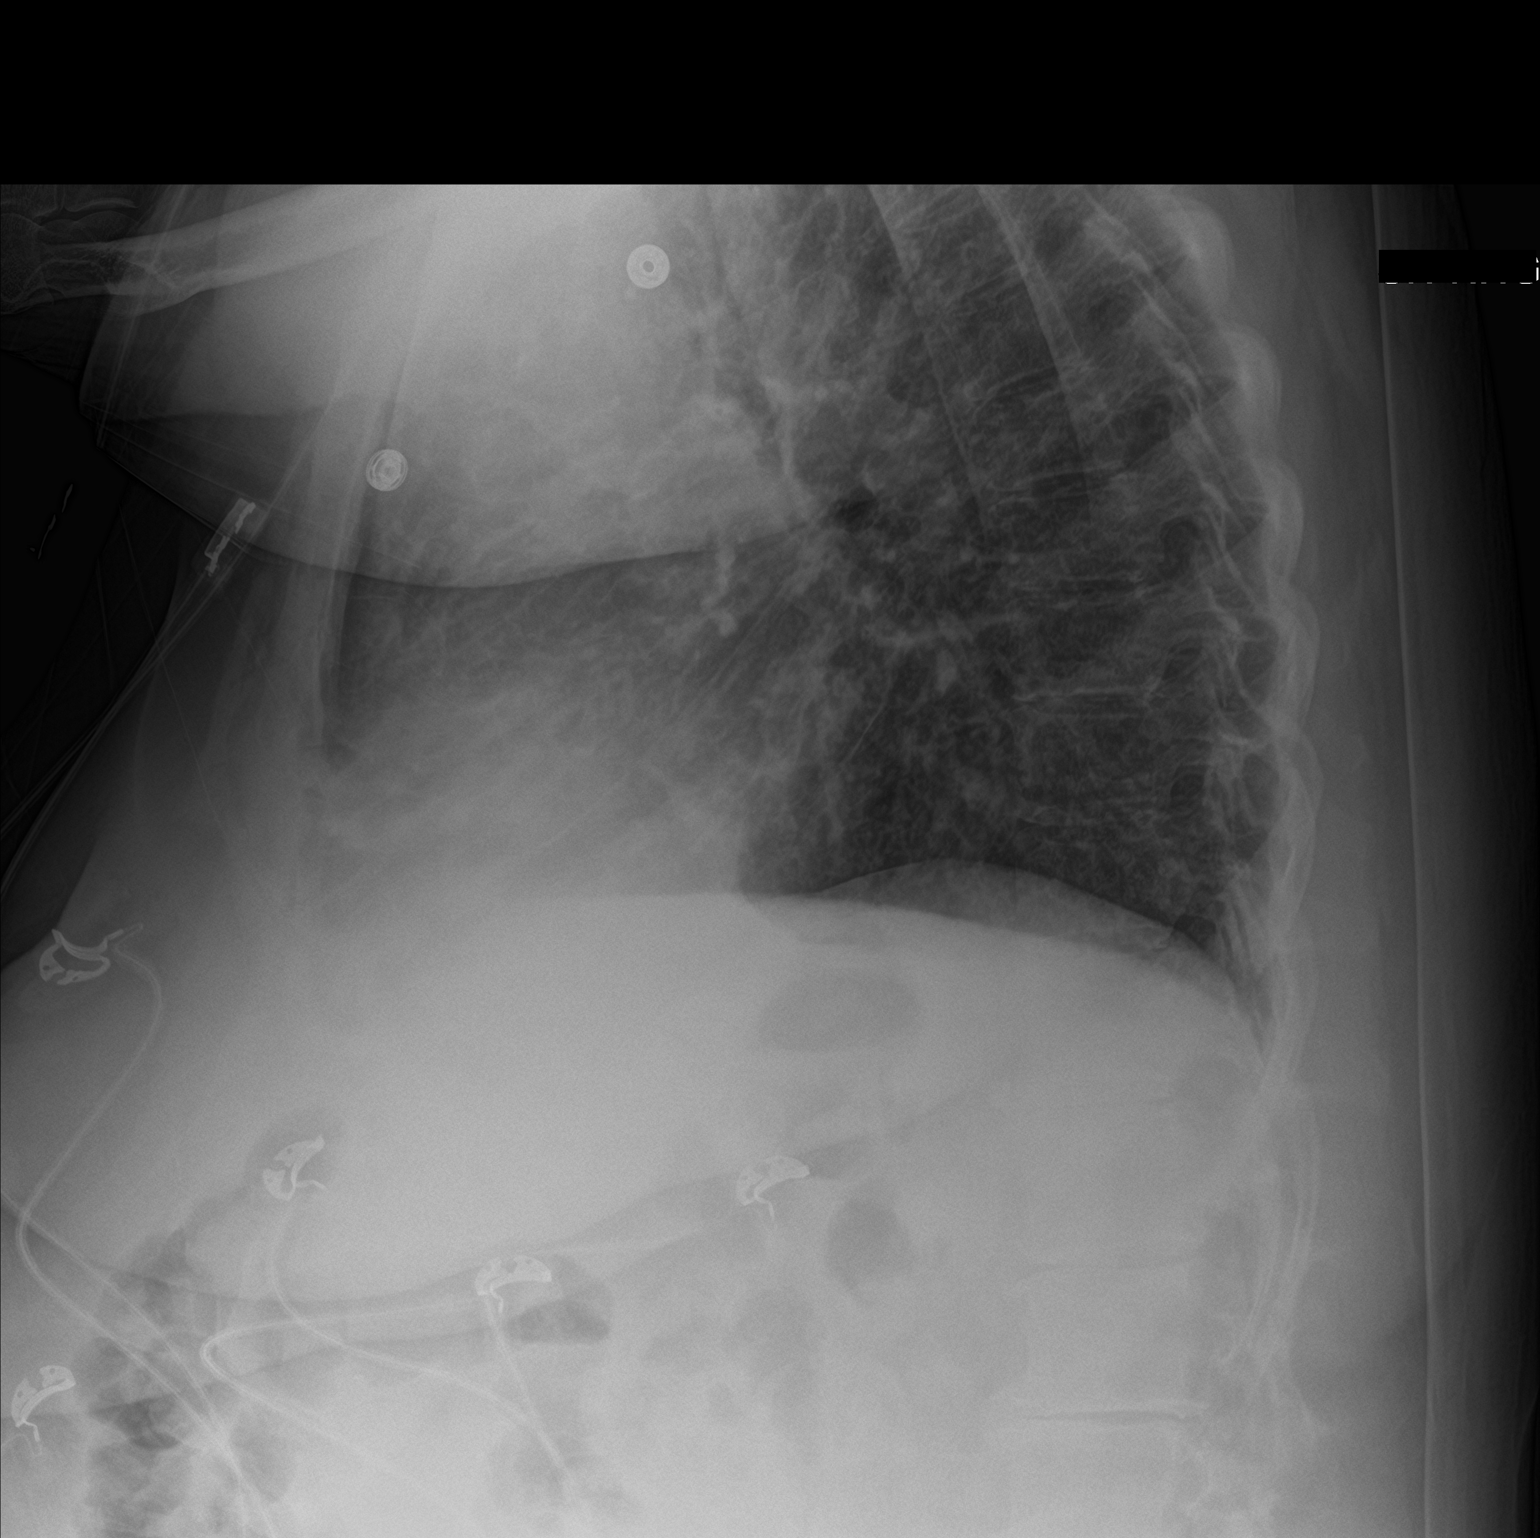

[chest ap]
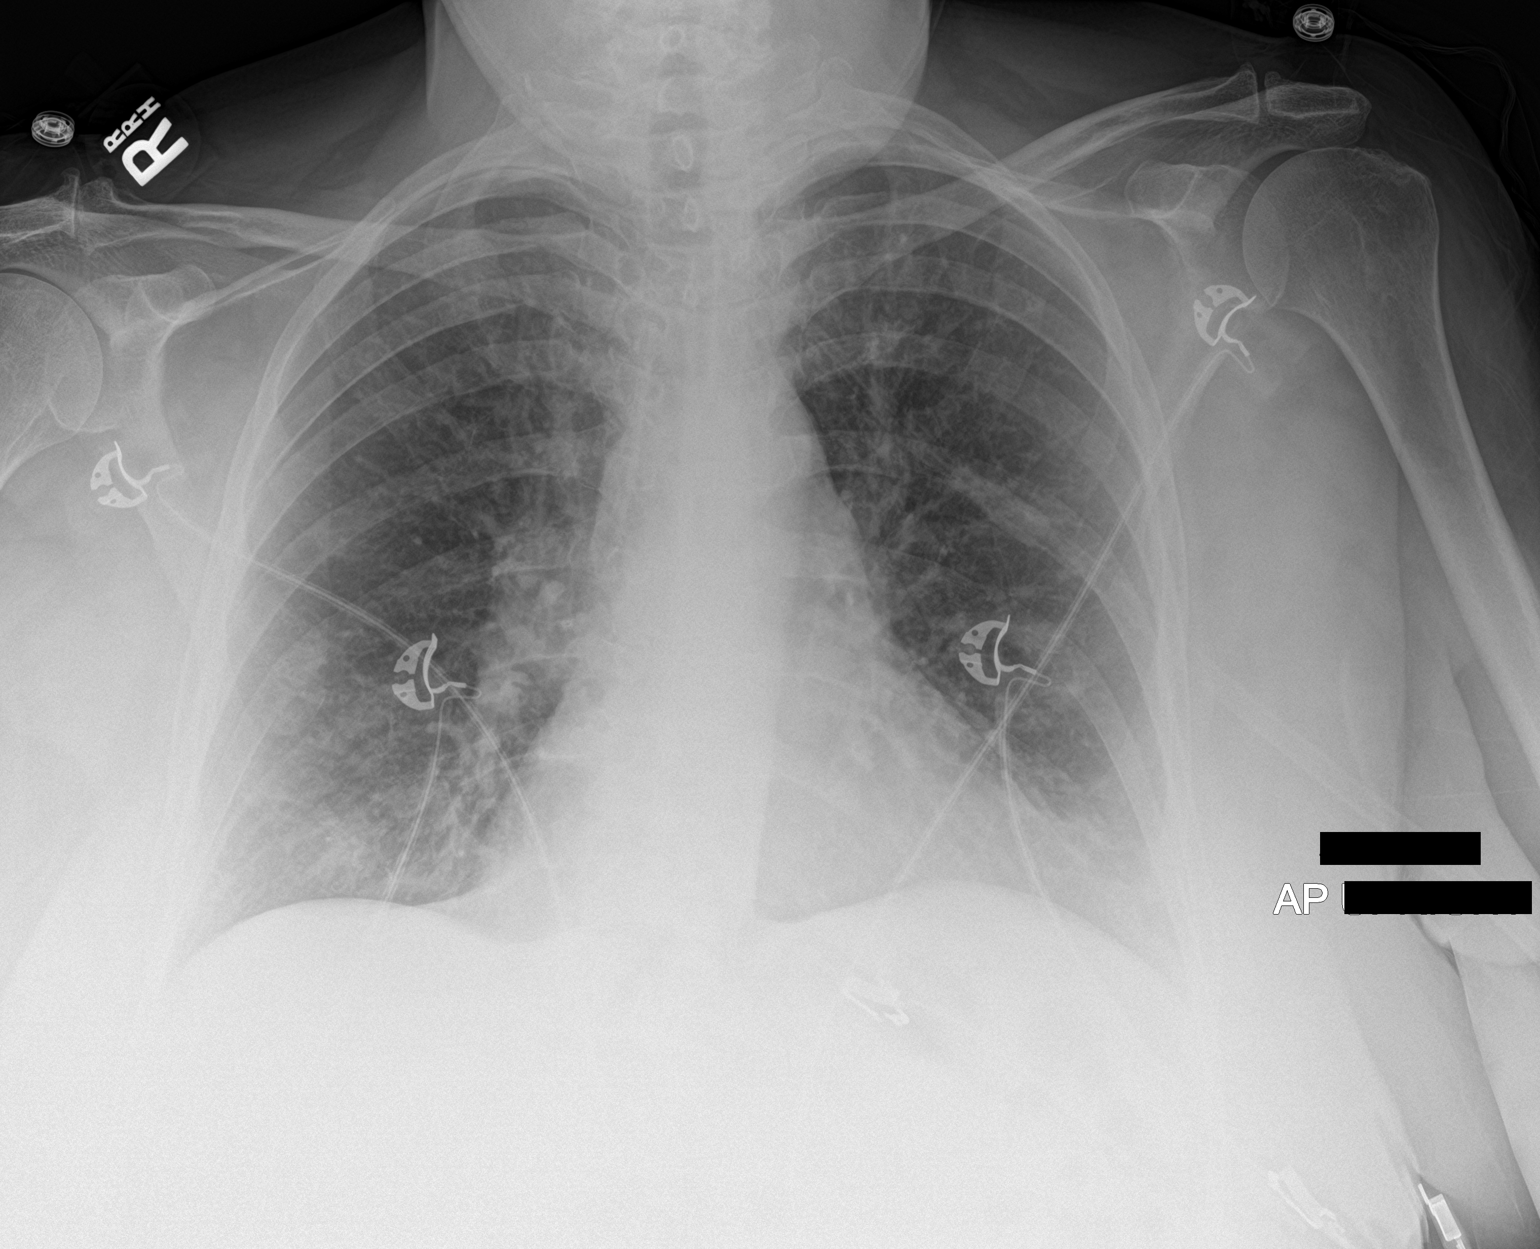

[2 of 2 positions shown; findings below may reference images not displayed]

FINDINGS: Cardiac silhouette is normal in size. No mediastinal or hilar
masses. No evidence of adenopathy.

There are prominent bronchovascular markings most evident at the
lung bases, similar to the most recent prior exam. There is no lung
consolidation. No pleural effusion or pneumothorax.

Skeletal structures are intact.
IMPRESSION: 1. Prominent bronchovascular markings most evident at the lung
bases. Although similar to the prior exam, a component of acute
bronchitis is suspected given the patient's history. No convincing
pneumonia. No pulmonary edema.

## 2018-09-17 MED ORDER — VANCOMYCIN HCL 10 G IV SOLR
2000.0000 mg | Freq: Once | INTRAVENOUS | Status: AC
  Administered 2018-09-17: 2000 mg via INTRAVENOUS
  Filled 2018-09-17: qty 2000

## 2018-09-17 MED ORDER — VANCOMYCIN HCL IN DEXTROSE 1-5 GM/200ML-% IV SOLN
1000.0000 mg | Freq: Two times a day (BID) | INTRAVENOUS | Status: DC
  Administered 2018-09-18: 1000 mg via INTRAVENOUS
  Filled 2018-09-17: qty 200

## 2018-09-17 MED ORDER — OXYCODONE HCL 5 MG PO TABS
5.0000 mg | ORAL_TABLET | Freq: Four times a day (QID) | ORAL | Status: DC | PRN
  Administered 2018-09-17 – 2018-09-18 (×4): 5 mg via ORAL
  Filled 2018-09-17 (×4): qty 1

## 2018-09-17 MED ORDER — ACETAMINOPHEN 650 MG RE SUPP: 650.0000 mg | Freq: Four times a day (QID) | RECTAL | Status: DC | PRN

## 2018-09-17 MED ORDER — SODIUM CHLORIDE 0.9 % IV SOLN
500.0000 mg | INTRAVENOUS | Status: DC
  Administered 2018-09-17: 500 mg via INTRAVENOUS
  Filled 2018-09-17 (×2): qty 500

## 2018-09-17 MED ORDER — SODIUM CHLORIDE 0.9 % IV SOLN
1.0000 g | Freq: Once | INTRAVENOUS | Status: AC
  Administered 2018-09-17: 1 g via INTRAVENOUS
  Filled 2018-09-17: qty 1

## 2018-09-17 MED ORDER — METFORMIN HCL ER 500 MG PO TB24
500.0000 mg | ORAL_TABLET | Freq: Every day | ORAL | Status: DC
  Administered 2018-09-17: 500 mg via ORAL
  Filled 2018-09-17: qty 1

## 2018-09-17 MED ORDER — FAMOTIDINE 20 MG PO TABS
10.0000 mg | ORAL_TABLET | Freq: Two times a day (BID) | ORAL | Status: DC
  Administered 2018-09-17 – 2018-09-18 (×2): 10 mg via ORAL
  Filled 2018-09-17 (×2): qty 1

## 2018-09-17 MED ORDER — MOMETASONE FURO-FORMOTEROL FUM 200-5 MCG/ACT IN AERO
2.0000 | INHALATION_SPRAY | Freq: Two times a day (BID) | RESPIRATORY_TRACT | Status: DC
  Administered 2018-09-17 – 2018-09-18 (×2): 2 via RESPIRATORY_TRACT
  Filled 2018-09-17: qty 8.8

## 2018-09-17 MED ORDER — ALBUTEROL SULFATE (2.5 MG/3ML) 0.083% IN NEBU: 2.5000 mg | INHALATION_SOLUTION | RESPIRATORY_TRACT | Status: DC | PRN

## 2018-09-17 MED ORDER — ACETAMINOPHEN 500 MG PO TABS
1000.0000 mg | ORAL_TABLET | Freq: Once | ORAL | Status: AC
  Administered 2018-09-17: 1000 mg via ORAL
  Filled 2018-09-17: qty 2

## 2018-09-17 MED ORDER — MORPHINE SULFATE (PF) 2 MG/ML IV SOLN
2.0000 mg | Freq: Once | INTRAVENOUS | Status: AC
  Administered 2018-09-17: 2 mg via INTRAVENOUS
  Filled 2018-09-17: qty 1

## 2018-09-17 MED ORDER — CYCLOBENZAPRINE HCL 10 MG PO TABS
5.0000 mg | ORAL_TABLET | Freq: Three times a day (TID) | ORAL | Status: DC | PRN
  Administered 2018-09-17 – 2018-09-18 (×2): 5 mg via ORAL
  Filled 2018-09-17 (×2): qty 1

## 2018-09-17 MED ORDER — GUAIFENESIN ER 600 MG PO TB12
600.0000 mg | ORAL_TABLET | Freq: Two times a day (BID) | ORAL | Status: DC
  Administered 2018-09-17 – 2018-09-18 (×2): 600 mg via ORAL
  Filled 2018-09-17 (×2): qty 1

## 2018-09-17 MED ORDER — SODIUM CHLORIDE 0.9 % IV SOLN: 250.0000 mL | INTRAVENOUS | Status: DC | PRN

## 2018-09-17 MED ORDER — GABAPENTIN 300 MG PO CAPS
300.0000 mg | ORAL_CAPSULE | Freq: Three times a day (TID) | ORAL | Status: DC
  Administered 2018-09-17 – 2018-09-18 (×2): 300 mg via ORAL
  Filled 2018-09-17 (×2): qty 1

## 2018-09-17 MED ORDER — INSULIN ASPART 100 UNIT/ML ~~LOC~~ SOLN
0.0000 [IU] | Freq: Three times a day (TID) | SUBCUTANEOUS | Status: DC
  Administered 2018-09-17: 3 [IU] via SUBCUTANEOUS
  Administered 2018-09-18: 7 [IU] via SUBCUTANEOUS
  Administered 2018-09-18: 5 [IU] via SUBCUTANEOUS

## 2018-09-17 MED ORDER — SODIUM CHLORIDE 0.9% FLUSH: 3.0000 mL | INTRAVENOUS | Status: DC | PRN

## 2018-09-17 MED ORDER — ACETAMINOPHEN 325 MG PO TABS: 650.0000 mg | ORAL_TABLET | Freq: Four times a day (QID) | ORAL | Status: DC | PRN

## 2018-09-17 MED ORDER — METHYLPREDNISOLONE SODIUM SUCC 125 MG IJ SOLR
80.0000 mg | Freq: Three times a day (TID) | INTRAMUSCULAR | Status: DC
  Administered 2018-09-17 – 2018-09-18 (×3): 80 mg via INTRAVENOUS
  Filled 2018-09-17 (×3): qty 2

## 2018-09-17 MED ORDER — IPRATROPIUM-ALBUTEROL 0.5-2.5 (3) MG/3ML IN SOLN
3.0000 mL | Freq: Once | RESPIRATORY_TRACT | Status: AC
  Administered 2018-09-17: 3 mL via RESPIRATORY_TRACT
  Filled 2018-09-17: qty 3

## 2018-09-17 MED ORDER — INSULIN ASPART 100 UNIT/ML ~~LOC~~ SOLN: 0.0000 [IU] | Freq: Every day | SUBCUTANEOUS | Status: DC

## 2018-09-17 MED ORDER — METOPROLOL TARTRATE 50 MG PO TABS
100.0000 mg | ORAL_TABLET | Freq: Two times a day (BID) | ORAL | Status: DC
  Administered 2018-09-17 – 2018-09-18 (×2): 100 mg via ORAL
  Filled 2018-09-17 (×2): qty 2

## 2018-09-17 MED ORDER — ENOXAPARIN SODIUM 40 MG/0.4ML ~~LOC~~ SOLN
40.0000 mg | SUBCUTANEOUS | Status: DC
  Administered 2018-09-17: 40 mg via SUBCUTANEOUS
  Filled 2018-09-17: qty 0.4

## 2018-09-17 MED ORDER — SODIUM CHLORIDE 0.9% FLUSH
3.0000 mL | Freq: Two times a day (BID) | INTRAVENOUS | Status: DC
  Administered 2018-09-17 – 2018-09-18 (×2): 3 mL via INTRAVENOUS

## 2018-09-17 MED ORDER — ALBUTEROL SULFATE (2.5 MG/3ML) 0.083% IN NEBU
2.5000 mg | INHALATION_SOLUTION | Freq: Four times a day (QID) | RESPIRATORY_TRACT | Status: DC
  Administered 2018-09-17 – 2018-09-18 (×3): 2.5 mg via RESPIRATORY_TRACT
  Filled 2018-09-17 (×3): qty 3

## 2018-09-17 MED ORDER — ALPRAZOLAM 0.5 MG PO TABS
0.5000 mg | ORAL_TABLET | Freq: Three times a day (TID) | ORAL | Status: DC | PRN
  Administered 2018-09-17 – 2018-09-18 (×2): 0.5 mg via ORAL
  Filled 2018-09-17 (×2): qty 1

## 2018-09-17 NOTE — ED Provider Notes (Addendum)
[facility] EMERGENCY DEPARTMENT Provider Note   CSN: [id number] Arrival date & time: 09/17/18  1233     History   Chief Complaint Chief Complaint  Patient presents with  . Shortness of Breath    HPI Tammy Blair is a 67 y.o. female.  Complains of right-sided anterior chest pain, cough productive of clear sputum for the past 2 or 3 days.  Chest pain is worse with coughing or with moving her right arm and improved with remaining still.  She treated herself with an albuterol nebulizer treatment 3 AM today with partial relief.  She denies nausea or vomiting.  No other associated symptoms  HPI  Past Medical History:  Diagnosis Date  . Anxiety   . Asthma   . Chronic back pain   . COPD (chronic obstructive pulmonary disease) ([place])    on home O2  . Diabetes mellitus without complication ([place])   . HOH (hard of hearing)   . Hypertension   . Lumbar radiculopathy     Patient Active Problem List   Diagnosis Date Noted  . Type 2 diabetes mellitus without complication ([place]) 21/30/8657  . COPD (chronic obstructive pulmonary disease) ([place]) 06/21/2018  . Acute metabolic encephalopathy 84/69/6295  . Acute and chronic respiratory failure with hypercapnia ([place])   . Sepsis secondary to UTI ([place]) 10/07/2017  . Tachycardia 10/07/2017  . Cocaine abuse ([place]) 10/07/2017  . Acute respiratory failure with hypercapnia ([place]) 08/11/2016  . Diabetes mellitus ([place]) 08/11/2016  . HOH (hard of hearing) 08/22/2013  . Hyperglycemia 08/21/2013  . COPD exacerbation ([place]) 02/18/2013  . GAD (generalized anxiety disorder) 02/18/2013  . Chronic pain syndrome 02/18/2013  . Tobacco use disorder 02/18/2013  . Chronic respiratory failure ([place]) 02/18/2013  . Obesity, unspecified 02/18/2013  . Essential hypertension 04/29/2009  . COPD UNSPECIFIED 04/29/2009  . WEIGHT GAIN, ABNORMAL 04/29/2009    Past Surgical History:  Procedure Laterality Date  . CESAREAN SECTION    . TONSILLECTOMY       OB History     None      Home Medications    Prior to Admission medications   Medication Sig Start Date End Date Taking? Authorizing Provider  albuterol (PROVENTIL HFA;VENTOLIN HFA) 108 (90 Base) MCG/ACT inhaler Inhale 1-2 puffs into the lungs every 4 (four) hours as needed for wheezing or shortness of breath. 03/14/16   Mesner, Corene Cornea, MD  albuterol (PROVENTIL) (2.5 MG/3ML) 0.083% nebulizer solution Take 3 mLs (2.5 mg total) by nebulization every 4 (four) hours as needed. For shortness of breath 06/23/18   Kathie Dike, MD  ALPRAZolam Duanne Moron) 0.5 MG tablet Take 0.5 mg by mouth 3 (three) times daily as needed for anxiety. For anxiety    [provider]  budesonide-formoterol (SYMBICORT) 160-4.5 MCG/ACT inhaler Inhale 1 puff into the lungs 2 (two) times daily. 06/23/18   Kathie Dike, MD  cyclobenzaprine (FLEXERIL) 10 MG tablet Take 0.5 tablets (5 mg total) by mouth 3 (three) times daily as needed for muscle spasms. For muscle spasm 10/12/17   Kathie Dike, MD  diltiazem (CARDIZEM) 60 MG tablet Take 1 tablet (60 mg total) by mouth 2 (two) times daily. 10/12/17 10/12/18  Kathie Dike, MD  gabapentin (NEURONTIN) 300 MG capsule Take 300 mg by mouth 3 (three) times daily.    [provider]  guaiFENesin (MUCINEX) 600 MG 12 hr tablet Take 1 tablet (600 mg total) by mouth 2 (two) times daily. 06/23/18 06/23/19  Kathie Dike, MD  metFORMIN (GLUCOPHAGE-XR) 500 MG 24 hr  tablet Take 500 mg by mouth every evening. With supper.    [provider]  metoprolol tartrate (LOPRESSOR) 100 MG tablet Take 1 tablet by mouth 2 (two) times daily. 03/23/18   [provider]  naproxen (NAPROSYN) 500 MG tablet Take 1 tablet (500 mg total) by mouth 2 (two) times daily with a meal. Patient not taking: Reported on 06/21/2018 08/20/17   Noemi Chapel, MD  Oxycodone HCl 10 MG TABS Take 0.5 tablets (5 mg total) by mouth 4 (four) times daily as needed. Patient not taking: Reported on 06/21/2018 10/12/17    Kathie Dike, MD  predniSONE (DELTASONE) 10 MG tablet Take 40mg  po daily for 2 days then 30mg  daily for 2 days then 20mg  daily for 2 days then 10mg  daily for 2 days then stop 06/23/18   Kathie Dike, MD  ranitidine (ZANTAC) 150 MG tablet Take 150 mg by mouth 2 (two) times daily.    [provider]  tiotropium (SPIRIVA) 18 MCG inhalation capsule Place 1 capsule (18 mcg total) into inhaler and inhale daily. 06/23/18   Kathie Dike, MD  TRIAMCINOLONE ACETONIDE, TOP, (TRIANEX) 0.05 % OINT Apply 1 application topically 2 (two) times daily as needed. 12/28/15   [provider]    Family History Family History  Problem Relation Age of Onset  . COPD Mother   . Cancer Mother        kidney  . Stroke Brother   . Asthma Sister     Social History Social History   Tobacco Use  . Smoking status: Current Every Day Smoker    Packs/day: 0.50    Years: 40.00    Pack years: 20.00    Types: Cigarettes  . Smokeless tobacco: Never Used  Substance Use Topics  . Alcohol use: No  . Drug use: Not Currently     Allergies   Patient has no known allergies.   Review of Systems Review of Systems  Constitutional: Negative.   HENT: Negative.   Respiratory: Positive for cough and shortness of breath.   Cardiovascular: Positive for chest pain.  Gastrointestinal: Negative.   Musculoskeletal: Negative.   Skin: Negative.   Allergic/Immunologic: Positive for immunocompromised state.       Diabetic  Neurological: Negative.   Psychiatric/Behavioral: Negative.   All other systems reviewed and are negative.    Physical Exam Updated Vital Signs BP (!) 140/106 (BP Location: Right Arm)   Pulse 73   Temp 100.1 F (37.8 C) (Oral)   Resp 19   Ht 5\' 9"  (1.753 m)   Wt 89.8 kg   SpO2 95%   BMI 29.24 kg/m   Physical Exam  Constitutional:  Chronically and acutely ill-appearing  HENT:  Head: Normocephalic and atraumatic.  Eyes: Pupils are equal, round, and reactive to light.  Conjunctivae are normal.  Neck: Neck supple. No tracheal deviation present. No thyromegaly present.  Cardiovascular: Normal rate and regular rhythm.  No murmur heard. Pulmonary/Chest: Effort normal.  Diffuse ronchi  Abdominal: Soft. Bowel sounds are normal. She exhibits no distension. There is no tenderness.  Musculoskeletal: Normal range of motion. She exhibits no edema or tenderness.  Neurological: She is alert. Coordination normal.  Skin: Skin is warm and dry. No rash noted.  Psychiatric: She has a normal mood and affect.  Nursing note and vitals reviewed.    ED Treatments / Results  Labs (all labs ordered are listed, but only abnormal results are displayed) Labs Reviewed - No data to display  EKG EKG Interpretation  Date/Time:  Monday September 17 2018 12:48:11 EDT Ventricular Rate:  73 PR Interval:    QRS Duration: 80 QT Interval:  374 QTC Calculation: 413 R Axis:   76 Text Interpretation:  Sinus rhythm Low voltage, precordial leads No significant change since last tracing Confirmed by Orlie Dakin [phone]) on 09/17/2018 1:12:52 PM   Radiology No results found.  Procedures Procedures (including critical care time)  Medications Ordered in ED Medications - No data to display  Results for orders placed or performed during the hospital encounter of 09/17/18  Blood culture (routine x 2)  Result Value Ref Range   Specimen Description RIGHT ANTECUBITAL    Special Requests      BOTTLES DRAWN AEROBIC AND ANAEROBIC Blood Culture adequate volume Performed at [facility], [address] [place], [place] [address]    Culture PENDING    Report Status PENDING   Blood culture (routine x 2)  Result Value Ref Range   Specimen Description LEFT ANTECUBITAL    Special Requests      BOTTLES DRAWN AEROBIC AND ANAEROBIC Blood Culture adequate volume Performed at [facility], [address], [place], [place] [address]    Culture PENDING    Report Status PENDING   CBC with  Differential/Platelet  Result Value Ref Range   WBC 20.3 (H) 4.0 - 10.5 K/uL   RBC 4.52 3.87 - 5.11 MIL/uL   Hemoglobin 14.3 12.0 - 15.0 g/dL   HCT 43.7 36.0 - 46.0 %   MCV 96.7 78.0 - 100.0 fL   MCH 31.6 26.0 - 34.0 pg   MCHC 32.7 30.0 - 36.0 g/dL   RDW 12.8 11.5 - 15.5 %   Platelets 268 150 - 400 K/uL   Neutrophils Relative % 82 %   Neutro Abs 16.5 (H) 1.7 - 7.7 K/uL   Lymphocytes Relative 9 %   Lymphs Abs 1.9 0.7 - 4.0 K/uL   Monocytes Relative 6 %   Monocytes Absolute 1.2 (H) 0.1 - 1.0 K/uL   Eosinophils Relative 3 %   Eosinophils Absolute 0.7 0.0 - 0.7 K/uL   Basophils Relative 0 %   Basophils Absolute 0.0 0.0 - 0.1 K/uL  Basic metabolic panel  Result Value Ref Range   Sodium 134 (L) 135 - 145 mmol/L   Potassium 4.0 3.5 - 5.1 mmol/L   Chloride 92 (L) 98 - 111 mmol/L   CO2 33 (H) 22 - 32 mmol/L   Glucose, Bld 139 (H) 70 - 99 mg/dL   BUN 8 8 - 23 mg/dL   Creatinine, Ser 0.67 0.44 - 1.00 mg/dL   Calcium 9.1 8.9 - 10.3 mg/dL   GFR calc non Af Amer >60 >60 mL/min   GFR calc Af Amer >60 >60 mL/min   Anion gap 9 5 - 15  I-Stat CG4 Lactic Acid, ED  Result Value Ref Range   Lactic Acid, Venous 1.72 0.5 - 1.9 mmol/L   Dg Chest 2 View  Result Date: 09/17/2018 CLINICAL DATA:  Cough and fever for 2 days. EXAM: CHEST - 2 VIEW COMPARISON:  06/21/2018 FINDINGS: Cardiac silhouette is normal in size. No mediastinal or hilar masses. No evidence of adenopathy. There are prominent bronchovascular markings most evident at the lung bases, similar to the most recent prior exam. There is no lung consolidation. No pleural effusion or pneumothorax. Skeletal structures are intact. IMPRESSION: 1. Prominent bronchovascular markings most evident at the lung bases. Although similar to the prior exam, a component of acute bronchitis is suspected given the  patient's history. No convincing pneumonia. No pulmonary edema. Electronically Signed   By: Lajean Manes M.D.   On: 09/17/2018 14:01   Initial  Impression / Assessment and Plan / ED Course  I have reviewed the triage vital signs and the nursing notes.  Pertinent labs & imaging results that were available during my care of the patient were reviewed by me and considered in my medical decision making (see chart for details).     2 PM feels improved after treatment with Tylenol, intravenous morphine and DuoNeb treatment.  IV antibiotics ordered to treat for H CAP, in light of patient's hospitalization of less than 90 days ago, cough, fever, leukocytosis..  I consulted Dr. Maudie Mercury from hospitalist service who will arrange for overnight stay I Counseled patient for 5 minutes on smoking cessation Final Clinical Impressions(s) / ED Diagnoses  Dx#1 HCAP #2 tobacco abuse Final diagnoses:  None    ED Discharge Orders    None      CRITICAL CARE Performed by: Orlie Dakin Total critical care time: 30 minutes Critical care time was exclusive of separately billable procedures and treating other patients. Critical care was necessary to treat or prevent imminent or life-threatening deterioration. Critical care was time spent personally by me on the following activities: development of treatment plan with patient and/or surrogate as well as nursing, discussions with consultants, evaluation of patient's response to treatment, examination of patient, obtaining history from patient or surrogate, ordering and performing treatments and interventions, ordering and review of laboratory studies, ordering and review of radiographic studies, pulse oximetry and re-evaluation of patient's condition. Orlie Dakin, MD 09/17/18 1527    Orlie Dakin, MD 09/24/18 1349

## 2018-09-17 NOTE — H&P (Addendum)
TRH H&P   Patient Demographics:    Tammy Blair, is a 67 y.o. female  MRN: [id number]   DOB - 1951-09-05  Admit Date - 09/17/2018  Outpatient Primary MD for the patient is Corrington, Delsa Grana, MD  Referring MD/NP/PA: Angelina Ok  Outpatient Specialists:    Patient coming from: home  Chief Complaint  Patient presents with  . Shortness of Breath      HPI:    Tammy Blair  is a 67 y.o. female, w Anxiety, Hypertension, Dm2, Copd on home o2, apparently presents with c/o cough, mostly clear sputum, right sided chest pain and fever.  The cough has been going on for a few days along with dyspnea and wheezing.  The chest pain apparently started today, sharp , right sided , worse with inspiration.  Pt denies abd pain, diarrhea, brbpr, dysuria, hematuria.    In Ed,  T 101.4, P 65, R 23, Bp 117/67,  Pox 87%   Wbc 20.3, Hgb 14.3, Plt 268 Na 134, K 4.0,  Bun 8, Creatinine 0.67  CXR IMPRESSION: 1. Prominent bronchovascular markings most evident at the lung bases. Although similar to the prior exam, a component of acute bronchitis is suspected given the patient's history. No convincing pneumonia. No pulmonary edema.  Ekg nsr at 75, nl axis, poor R progression, no st- t changes c/w ischemia  Pt will be admitted for fever, chest pain, Copd exacerbation.    Review of systems:    In addition to the HPI above,  , No Headache, No changes with Vision or hearing, No problems swallowing food or Liquids, No Abdominal pain, No Nausea or Vommitting, Bowel movements are regular, No Blood in stool or Urine, No dysuria, No new skin rashes or bruises, No new joints pains-aches,  No new weakness, tingling, numbness in any extremity, No recent weight gain or loss, No polyuria, polydypsia or polyphagia, No significant Mental Stressors.  A full 10 point Review of Systems was done, except as  stated above, all other Review of Systems were negative.   With Past History of the following :    Past Medical History:  Diagnosis Date  . Anxiety   . Asthma   . Chronic back pain   . COPD (chronic obstructive pulmonary disease) ([place])    on home O2  . Diabetes mellitus without complication ([place])   . HOH (hard of hearing)   . Hypertension   . Lumbar radiculopathy       Past Surgical History:  Procedure Laterality Date  . CESAREAN SECTION    . TONSILLECTOMY        Social History:     Social History   Tobacco Use  . Smoking status: Current Every Day Smoker    Packs/day: 0.50    Years: 40.00    Pack years: 20.00    Types: Cigarettes  . Smokeless tobacco: Never Used  Substance Use Topics  . Alcohol use: No     Lives - at home  Mobility - walks by self   Family History :     Family History  Problem Relation Age of Onset  . COPD Mother   . Cancer Mother        kidney  . Stroke Brother   . Asthma Sister        Home Medications:   Prior to Admission medications   Medication Sig Start Date End Date Taking? Authorizing Provider  albuterol (PROVENTIL HFA;VENTOLIN HFA) 108 (90 Base) MCG/ACT inhaler Inhale 1-2 puffs into the lungs every 4 (four) hours as needed for wheezing or shortness of breath. 03/14/16  Yes Mesner, Corene Cornea, MD  albuterol (PROVENTIL) (2.5 MG/3ML) 0.083% nebulizer solution Take 3 mLs (2.5 mg total) by nebulization every 4 (four) hours as needed. For shortness of breath 06/23/18  Yes Kathie Dike, MD  ALPRAZolam Duanne Moron) 0.5 MG tablet Take 0.5 mg by mouth 3 (three) times daily as needed for anxiety. For anxiety   Yes [provider]  budesonide-formoterol (SYMBICORT) 160-4.5 MCG/ACT inhaler Inhale 1 puff into the lungs 2 (two) times daily. 06/23/18  Yes Kathie Dike, MD  cyclobenzaprine (FLEXERIL) 10 MG tablet Take 0.5 tablets (5 mg total) by mouth 3 (three) times daily as needed for muscle spasms. For muscle spasm 10/12/17  Yes Kathie Dike, MD  gabapentin (NEURONTIN) 300 MG capsule Take 300 mg by mouth 3 (three) times daily.   Yes [provider]  guaiFENesin (MUCINEX) 600 MG 12 hr tablet Take 1 tablet (600 mg total) by mouth 2 (two) times daily. 06/23/18 06/23/19 Yes Kathie Dike, MD  metFORMIN (GLUCOPHAGE-XR) 500 MG 24 hr tablet Take 500 mg by mouth every evening. With supper.   Yes [provider]  metoprolol tartrate (LOPRESSOR) 100 MG tablet Take 1 tablet by mouth 2 (two) times daily. 03/23/18  Yes [provider]  Oxycodone HCl 10 MG TABS Take 0.5 tablets (5 mg total) by mouth 4 (four) times daily as needed. 10/12/17  Yes Kathie Dike, MD  ranitidine (ZANTAC) 150 MG tablet Take 150 mg by mouth 2 (two) times daily.   Yes [provider]  TRIAMCINOLONE ACETONIDE, TOP, (TRIANEX) 0.05 % OINT Apply 1 application topically 2 (two) times daily as needed. 12/28/15  Yes [provider]  diltiazem (CARDIZEM) 60 MG tablet Take 1 tablet (60 mg total) by mouth 2 (two) times daily. Patient not taking: Reported on 09/17/2018 10/12/17 10/12/18  Kathie Dike, MD  naproxen (NAPROSYN) 500 MG tablet Take 1 tablet (500 mg total) by mouth 2 (two) times daily with a meal. Patient not taking: Reported on 09/17/2018 08/20/17   Noemi Chapel, MD  predniSONE (DELTASONE) 10 MG tablet Take 40mg  po daily for 2 days then 30mg  daily for 2 days then 20mg  daily for 2 days then 10mg  daily for 2 days then stop Patient not taking: Reported on 09/17/2018 06/23/18   Kathie Dike, MD  tiotropium (SPIRIVA) 18 MCG inhalation capsule Place 1 capsule (18 mcg total) into inhaler and inhale daily. Patient not taking: Reported on 09/17/2018 06/23/18   Kathie Dike, MD     Allergies:    No Known Allergies   Physical Exam:   Vitals  Blood pressure 134/73, pulse 67, temperature (!) 101.4 F (38.6 C), temperature source Rectal, resp. rate (!) 23, height 5\' 9"  (1.753 m), weight 89.8 kg, SpO2 100 %.   1. General   lying in bed in NAD,  2. Normal affect and insight, Not Suicidal or Homicidal, Awake Alert, Oriented X 3.  3. No F.N deficits, ALL C.Nerves Intact, Strength 5/5 all 4 extremities, Sensation intact all 4 extremities, Plantars down going.  4. Ears and Eyes appear Normal, Conjunctivae clear, PERRLA. Moist Oral Mucosa.  5. Supple Neck, No JVD, No cervical lymphadenopathy appriciated, No Carotid Bruits.  6. Symmetrical Chest wall movement, lung sound tight, slight decrease in bs at the right lung base, no crackles, + bilateral exp wheezing,   7. RRR, No Gallops, Rubs or Murmurs, No Parasternal Heave.  8. Positive Bowel Sounds, Abdomen Soft, No tenderness, No organomegaly appriciated,No rebound -guarding or rigidity.  9.  No Cyanosis, Normal Skin Turgor, No Skin Rash or Bruise.  10. Good muscle tone,  joints appear normal , no effusions, Normal ROM.  11. No Palpable Lymph Nodes in Neck or Axillae      Data Review:    CBC Recent Labs  Lab 09/17/18 1339  WBC 20.3*  HGB 14.3  HCT 43.7  PLT 268  MCV 96.7  MCH 31.6  MCHC 32.7  RDW 12.8  LYMPHSABS 1.9  MONOABS 1.2*  EOSABS 0.7  BASOSABS 0.0   ------------------------------------------------------------------------------------------------------------------  Chemistries  Recent Labs  Lab 09/17/18 1339  NA 134*  K 4.0  CL 92*  CO2 33*  GLUCOSE 139*  BUN 8  CREATININE 0.67  CALCIUM 9.1   ------------------------------------------------------------------------------------------------------------------ estimated creatinine clearance is 83.7 mL/min (by C-G formula based on SCr of 0.67 mg/dL). ------------------------------------------------------------------------------------------------------------------ No results for input(s): TSH, T4TOTAL, T3FREE, THYROIDAB in the last 72 hours.  Invalid input(s): FREET3  Coagulation profile No results for input(s): INR, PROTIME in the last 168  hours. ------------------------------------------------------------------------------------------------------------------- No results for input(s): DDIMER in the last 72 hours. -------------------------------------------------------------------------------------------------------------------  Cardiac Enzymes No results for input(s): CKMB, TROPONINI, MYOGLOBIN in the last 168 hours.  Invalid input(s): CK ------------------------------------------------------------------------------------------------------------------ No results found for: BNP   ---------------------------------------------------------------------------------------------------------------  Urinalysis    Component Value Date/Time   COLORURINE YELLOW 06/21/2018 [place] 06/21/2018 1233   LABSPEC 1.019 06/21/2018 1233   PHURINE 5.0 06/21/2018 1233   GLUCOSEU NEGATIVE 06/21/2018 1233   HGBUR NEGATIVE 06/21/2018 1233   BILIRUBINUR NEGATIVE 06/21/2018 1233   KETONESUR NEGATIVE 06/21/2018 1233   PROTEINUR NEGATIVE 06/21/2018 1233   UROBILINOGEN 0.2 07/17/2014 1613   NITRITE NEGATIVE 06/21/2018 1233   LEUKOCYTESUR NEGATIVE 06/21/2018 1233    ----------------------------------------------------------------------------------------------------------------   Imaging Results:    Dg Chest 2 View  Result Date: 09/17/2018 CLINICAL DATA:  Cough and fever for 2 days. EXAM: CHEST - 2 VIEW COMPARISON:  06/21/2018 FINDINGS: Cardiac silhouette is normal in size. No mediastinal or hilar masses. No evidence of adenopathy. There are prominent bronchovascular markings most evident at the lung bases, similar to the most recent prior exam. There is no lung consolidation. No pleural effusion or pneumothorax. Skeletal structures are intact. IMPRESSION: 1. Prominent bronchovascular markings most evident at the lung bases. Although similar to the prior exam, a component of acute bronchitis is suspected given the patient's  history. No convincing pneumonia. No pulmonary edema. Electronically Signed   By: Lajean Manes M.D.   On: 09/17/2018 14:01       Assessment & Plan:    Principal Problem:   COPD exacerbation ([place]) Active Problems:   Diabetes mellitus (HCC)   Fever   Copd exacerbation Solumedrol 80mg  iv q8h Zithromax 500mg  iv qday Symbicort 2puff b id Albuterol neb q6h and q4h prn  Chest pain Tele Trop I  q6h x3 Check d dimer , if positive then CTA chest  Fever Check urinalysis Influenza panel pending Blood culture x2 pending  Dm2 Cont metformin 500mg  po qday fsbs ac and qhs,  ISS  Diabetic neuropathy Cont Gabapentin 300mg  po tid Cont oxycodone prn  Hypertension Cont Metoprolol 100mg  po bid     DVT Prophylaxis -  Lovenox - SCDs   AM Labs Ordered, also please review Full Orders  Family Communication: Admission, patients condition and plan of care including tests being ordered have been discussed with the patient  who indicate understanding and agree with the plan and Code Status.  Code Status  FULL CODE  Likely DC to  home  Condition GUARDED   Consults called: none  Admission status: observation   Time spent in minutes : 70   Jani Gravel M.D on 09/17/2018 at 3:21 PM  Between 7am to 7pm - Pager - [phone]  . After 7pm go to [email or web address] - password [facility]  [facility] - Office  [phone]

## 2018-09-17 NOTE — ED Triage Notes (Signed)
Pt reports she went to her sister's house today without her oxygen and became very sob.  States she is usually able to go out without oxygen.  Pt has been having cough and right sided chest pain with productive cough x 2 days.

## 2018-09-17 NOTE — Progress Notes (Signed)
Pharmacy Antibiotic Note  Tammy Blair is a 67 y.o. female admitted on 09/17/2018 with pneumonia.  Pharmacy has been consulted for Vancomycin dosing.  Plan: Vancomycin 2000 mg IV x 1 dose Vancomycin 1000 mg IV every 12 hours.  Goal trough 15-20 mcg/mL.  Cefepime 1000 mg IV x 1 dose. Awaiting further orders Monitor labs, c/s, and vanco trough as indicated  Height: 5\' 9"  (175.3 cm) Weight: 198 lb (89.8 kg) IBW/kg (Calculated) : 66.2  Temp (24hrs), Avg:99.6 F (37.6 C), Min:97.8 F (36.6 C), Max:101.4 F (38.6 C)  Recent Labs  Lab 09/17/18 1339 09/17/18 1439  WBC 20.3*  --   CREATININE 0.67  --   LATICACIDVEN  --  1.72    Estimated Creatinine Clearance: 83.7 mL/min (by C-G formula based on SCr of 0.67 mg/dL).    No Known Allergies  Antimicrobials this admission: Vanco 10/7 >>  Cefepime 10/7 x 1 dose  Dose adjustments this admission: N/A  Microbiology results: 10/7 BCx:    Thank you for allowing pharmacy to be a part of this patient's care.  Ramond Craver 09/17/2018 3:42 PM

## 2018-09-18 DIAGNOSIS — E119 Type 2 diabetes mellitus without complications: Secondary | ICD-10-CM | POA: Diagnosis not present

## 2018-09-18 DIAGNOSIS — J9611 Chronic respiratory failure with hypoxia: Secondary | ICD-10-CM | POA: Diagnosis not present

## 2018-09-18 DIAGNOSIS — I10 Essential (primary) hypertension: Secondary | ICD-10-CM | POA: Diagnosis not present

## 2018-09-18 DIAGNOSIS — J9612 Chronic respiratory failure with hypercapnia: Secondary | ICD-10-CM

## 2018-09-18 DIAGNOSIS — J441 Chronic obstructive pulmonary disease with (acute) exacerbation: Secondary | ICD-10-CM | POA: Diagnosis not present

## 2018-09-18 DIAGNOSIS — F411 Generalized anxiety disorder: Secondary | ICD-10-CM

## 2018-09-18 LAB — GLUCOSE, CAPILLARY
GLUCOSE-CAPILLARY: 263 mg/dL — AB (ref 70–99)
Glucose-Capillary: 311 mg/dL — ABNORMAL HIGH (ref 70–99)

## 2018-09-18 LAB — COMPREHENSIVE METABOLIC PANEL
ALBUMIN: 3.8 g/dL (ref 3.5–5.0)
ALT: 30 U/L (ref 0–44)
AST: 22 U/L (ref 15–41)
Alkaline Phosphatase: 55 U/L (ref 38–126)
Anion gap: 8 (ref 5–15)
BILIRUBIN TOTAL: 0.5 mg/dL (ref 0.3–1.2)
BUN: 9 mg/dL (ref 8–23)
CHLORIDE: 98 mmol/L (ref 98–111)
CO2: 33 mmol/L — AB (ref 22–32)
Calcium: 9.1 mg/dL (ref 8.9–10.3)
Creatinine, Ser: 0.71 mg/dL (ref 0.44–1.00)
GFR calc Af Amer: >60 mL/min (ref >60)
GFR calc non Af Amer: >60 mL/min (ref >60)
GLUCOSE: 243 mg/dL — AB (ref 70–99)
POTASSIUM: 4.6 mmol/L (ref 3.5–5.1)
SODIUM: 139 mmol/L (ref 135–145)
TOTAL PROTEIN: 7.6 g/dL (ref 6.5–8.1)

## 2018-09-18 LAB — CBC
HEMATOCRIT: 42.3 % (ref 36.0–46.0)
Hemoglobin: 13.7 g/dL (ref 12.0–15.0)
MCH: 31.4 pg (ref 26.0–34.0)
MCHC: 32.4 g/dL (ref 30.0–36.0)
MCV: 97 fL (ref 80.0–100.0)
Platelets: 245 10*3/uL (ref 150–400)
RBC: 4.36 MIL/uL (ref 3.87–5.11)
RDW: 12.7 % (ref 11.5–15.5)
WBC: 17.9 10*3/uL — ABNORMAL HIGH (ref 4.0–10.5)

## 2018-09-18 LAB — TROPONIN I: Troponin I: <0.03 ng/mL (ref <0.03)

## 2018-09-18 MED ORDER — PREDNISONE 10 MG PO TABS: ORAL_TABLET | ORAL | 0 refills | Status: DC

## 2018-09-18 MED ORDER — LEVOFLOXACIN 750 MG PO TABS: 750.0000 mg | ORAL_TABLET | Freq: Every day | ORAL | 0 refills | Status: AC

## 2018-09-18 NOTE — Progress Notes (Signed)
Patient states understanding of discharge instructions.  

## 2018-09-18 NOTE — Care Management (Signed)
Patient Information   Patient Name Myishia, Kasik ([id number]) Sex Female DOB December 26, 1950  Room Bed  A310 A310-01  Patient Demographics   Address [place] [address] Phone [phone] [facility]) [phone] (Mobile)  Patient Ethnicity & Race   Ethnic Group Patient Race  Not Hispanic or Latino White or Caucasian  Emergency Contact(s)   Name Relation Home Work Mobile  [place] Sister [phone]    Chanelle, Hod[place]on   [phone]  Molli Posey Significant other   [phone]  Documents on File    Status Date Received Description  Documents for the Patient  EMR Medication Summary Not Received    EMR Problem Summary Not Received    EMR Patient Summary Not Received    [place] Not Received    [place] - Scanned Received 84/53/64   Driver's License Not Received    Historic Radiology Documentation Not Received    Historic Radiology Documentation Not Received    [place] E-Signature HIPAA Notice of Privacy Received 08/11/16   [facility] E-Signature HIPAA Notice of Privacy Spanish Not Received    Insurance Card Received 11/29/12   Advance Directives/Living Will/HCPOA/POA Not Received    Financial Application Not Received    [place] HIPAA NOTICE OF PRIVACY - Scanned Not Received    Insurance Card Not Received    Insurance Card Received 05/17/13   Insurance Card Not Received    Advanced Beneficiary Notice (ABN) Not Received    HIM ROI Authorization (Expired) 04/28/14   Release of Information  05/10/14   Other Photo ID Not Received    HIM ROI Authorization  12/21/15   Documents for the Encounter  AOB (Assignment of Insurance Benefits) Not Received    E-signature AOB Signed 09/17/18   MEDICARE RIGHTS Not Received    E-signature Medicare Rights Signed 09/17/18   After Visit Summary   ED After Visit Summary  ED Patient Billing Extract   ED PB Billing Extract  Cardiac Monitoring Strip Received  09/17/18   Cardiac Monitoring Strip Shift Summary Received 09/17/18   Admission Information   Attending Provider Admitting Provider Admission Type Admission Date/Time  Kathie Dike, MD Jani Gravel, MD Emergency 09/17/18 1250  Discharge Date Hospital Service Auth/Cert Status [place]  Unit Room/Bed Admission Status   AP-DEPT 300 A310/A310-01 Admission (Confirmed)   Admission   Complaint  Shortness of [facility] Account   Name Acct ID Class Status Primary Coverage  Janijah, [place] [id number] Observation Open MEDICARE - MEDICARE PART B      Guarantor Account (for Hospital Account [id number])   Name Relation to Pt Service Area Active? Acct Type  Edward Qualia Self CHSA Yes Personal/Family  Address Phone    [place], [place] [address] [phone])        Coverage Information (for Hospital Account [id number])   1. [place] PART B   F/O Payor/Plan Precert #  MEDICARE/MEDICARE PART B   Subscriber Subscriber #  [place], Esch [id number]  Address Phone  [address] [place], [place] [address]   2. MEDICAID [place]/MEDICAID [place] ACCESS   F/O Payor/Plan Precert #  MEDICAID [place]/MEDICAID [place] ACCESS   Subscriber Subscriber #  Shiann, Kam [id number]  Address Phone  [address] [address] [place], [place] [address] [phone]

## 2018-09-18 NOTE — Care Management Note (Signed)
Case Management Note  Patient Details  Name: Tammy Blair MRN: [id number] Date of Birth: 1951-03-14  Subjective/Objective:  Admitted with COPD exacerbation. From home, has strong family support. Pt has insurance with drug coverage. Pt has home oxygen and neb machine from [facility]. Daughter at bedside, in process of becoming pt's aid. Pt needs WC, requests referral be sent to [facility]. No HH needed.                  Action/Plan: DC home today. DME referral faxed to [facility]. Family will cont them to discuss pick-up vs delivery.   Expected Discharge Date:       09/18/18           Expected Discharge Plan:  Home/Self Care  In-House Referral:  NA  Discharge planning Services  CM Consult  Post Acute Care Choice:  Durable Medical Equipment Choice offered to:  Patient, Adult Children  DME Arranged:  Wheelchair manual DME Agency:  [facility]  Status of Service:  Completed, signed off   Sherald Barge, RN 09/18/2018, 11:14 AM

## 2018-09-18 NOTE — Discharge Summary (Signed)
Physician Discharge Summary  Tammy Blair RCV:[id number] DOB: October 22, 1951 DOA: 09/17/2018  PCP: [name], MD  Admit date: 09/17/2018 Discharge date: 09/18/2018  Admitted From: Home Disposition: Home  Recommendations for Outpatient Follow-up:  1. Follow up with PCP in 1-2 weeks 2. Please obtain BMP/CBC in one week  Home Health: Equipment/Devices:  Discharge Condition: Stable CODE STATUS: Full code Diet recommendation: Heart healthy  Brief/Interim Summary: 67 year old female who is oxygen dependent COPD and chronic respiratory failure presents to the hospital with complaints of shortness of breath.  She was noted to be febrile in the emergency room.  Chest x-ray did not show any clear pneumonia.  It is likely that she has an acute bronchitis which precipitated a COPD exacerbation.  Patient was admitted to the hospital, started on antibiotics, intravenous steroids and bronchodilators.  She was also noted to have some right-sided chest pain, but this is likely musculoskeletal in origin.  D-dimer was also found to be negative.  By the following morning, she is feeling significantly better.  Her muscular pain had improved.  Breathing is also improved and she feels that it is close to baseline.  She is able to carry on a conversation comfortably.  At baseline, she is sedentary and is not ambulate very much.  She will be discharged on a prednisone taper and continued on a course of antibiotics.  She already has nebulizer treatments set up at home.  She also has oxygen already set up at home.  She feels ready for discharge home today.  The remainder of her medical problems remained stable.  Discharge Diagnoses:  Principal Problem:   COPD exacerbation ([place]) Active Problems:   Essential hypertension   GAD (generalized anxiety disorder)   Chronic respiratory failure (H[facility])   Diabetes mellitus ([place])   Type 2 diabetes mellitus without complication ([place])   Fever    Discharge  Instructions  Discharge Instructions    Diet - low sodium heart healthy   Complete by:  As directed    Increase activity slowly   Complete by:  As directed      Allergies as of 09/18/2018   No Known Allergies     Medication List    STOP taking these medications   diltiazem 60 MG tablet Commonly known as:  CARDIZEM     TAKE these medications   albuterol 108 (90 Base) MCG/ACT inhaler Commonly known as:  PROVENTIL HFA;VENTOLIN HFA Inhale 1-2 puffs into the lungs every 4 (four) hours as needed for wheezing or shortness of breath.   albuterol (2.5 MG/3ML) 0.083% nebulizer solution Commonly known as:  PROVENTIL Take 3 mLs (2.5 mg total) by nebulization every 4 (four) hours as needed. For shortness of breath   ALPRAZolam 0.5 MG tablet Commonly known as:  XANAX Take 0.5 mg by mouth 3 (three) times daily as needed for anxiety. For anxiety   budesonide-formoterol 160-4.5 MCG/ACT inhaler Commonly known as:  SYMBICORT Inhale 1 puff into the lungs 2 (two) times daily.   cyclobenzaprine 10 MG tablet Commonly known as:  FLEXERIL Take 0.5 tablets (5 mg total) by mouth 3 (three) times daily as needed for muscle spasms. For muscle spasm   gabapentin 300 MG capsule Commonly known as:  NEURONTIN Take 300 mg by mouth 3 (three) times daily.   guaiFENesin 600 MG 12 hr tablet Commonly known as:  MUCINEX Take 1 tablet (600 mg total) by mouth 2 (two) times daily.   levofloxacin 750 MG tablet Commonly known as:  LEVAQUIN Take  1 tablet (750 mg total) by mouth daily for 5 days.   metFORMIN 500 MG 24 hr tablet Commonly known as:  GLUCOPHAGE-XR Take 500 mg by mouth every evening. With supper.   metoprolol tartrate 100 MG tablet Commonly known as:  LOPRESSOR Take 1 tablet by mouth 2 (two) times daily.   naproxen 500 MG tablet Commonly known as:  NAPROSYN Take 1 tablet (500 mg total) by mouth 2 (two) times daily with a meal.   Oxycodone HCl 10 MG Tabs Take 0.5 tablets (5 mg total) by  mouth 4 (four) times daily as needed.   predniSONE 10 MG tablet Commonly known as:  DELTASONE Take 40mg  po daily for 2 days then 30mg  daily for 2 days then 20mg  daily for 2 days then 10mg  daily for 2 days then stop   ranitidine 150 MG tablet Commonly known as:  ZANTAC Take 150 mg by mouth 2 (two) times daily.   tiotropium 18 MCG inhalation capsule Commonly known as:  SPIRIVA Place 1 capsule (18 mcg total) into inhaler and inhale daily.   TRIANEX 0.05 % Oint Generic drug:  TRIAMCINOLONE ACETONIDE (TOP) Apply 1 application topically 2 (two) times daily as needed.            Durable Medical Equipment  (From admission, onward)         Start     Ordered   09/18/18 1113  For home use only DME standard manual wheelchair with seat cushion  Once    Comments:  Patient suffers from COPD which impairs their ability to perform daily activities like ambulating in the home.  A walker will not resolve  issue with performing activities of daily living. A wheelchair will allow patient to safely perform daily activities. Patient can safely propel the wheelchair in the home or has a caregiver who can provide assistance.  Accessories: elevating leg rests (ELRs), wheel locks, extensions and anti-tippers.    Ht: 5'9" Wt: 91.6kg   09/18/18 1113         Follow-up Information    Lemmie Evens, MD.   Specialty:  Family Medicine Contact information: [place] [address] [phone]          No Known Allergies  Consultations:     Procedures/Studies: Dg Chest 2 View  Result Date: 09/17/2018 CLINICAL DATA:  Cough and fever for 2 days. EXAM: CHEST - 2 VIEW COMPARISON:  06/21/2018 FINDINGS: Cardiac silhouette is normal in size. No mediastinal or hilar masses. No evidence of adenopathy. There are prominent bronchovascular markings most evident at the lung bases, similar to the most recent prior exam. There is no lung consolidation. No pleural effusion or  pneumothorax. Skeletal structures are intact. IMPRESSION: 1. Prominent bronchovascular markings most evident at the lung bases. Although similar to the prior exam, a component of acute bronchitis is suspected given the patient's history. No convincing pneumonia. No pulmonary edema. Electronically Signed   By: Lajean Manes M.D.   On: 09/17/2018 14:01      Subjective: Feeling better.  Feels that breathing is back to baseline.  No significant cough.  Discharge Exam: Vitals:   09/17/18 2033 09/18/18 0133 09/18/18 0553 09/18/18 0723  BP: 135/63  (!) 141/69   Pulse: 71  (!) 58   Resp: 18  18   Temp: 97.6 F (36.4 C)  97.7 F (36.5 C)   TempSrc: Oral  Oral   SpO2: 93% 97% 98% 96%  Weight:   91.6 kg   Height:  General: Pt is alert, awake, not in acute distress Cardiovascular: RRR, S1/S2 +, no rubs, no gallops Respiratory: CTA bilaterally, no wheezing, no rhonchi Abdominal: Soft, NT, ND, bowel sounds + Extremities: no edema, no cyanosis    The results of significant diagnostics from this hospitalization (including imaging, microbiology, ancillary and laboratory) are listed below for reference.     Microbiology: Recent Results (from the past 240 hour(s))  Blood culture (routine x 2)     Status: None (Preliminary result)   Collection Time: 09/17/18  1:39 PM  Result Value Ref Range Status   Specimen Description RIGHT ANTECUBITAL  Final   Special Requests   Final    BOTTLES DRAWN AEROBIC AND ANAEROBIC Blood Culture adequate volume   Culture   Final    NO GROWTH < 24 HOURS Performed at [facility], [address] [place], [place] [address]    Report Status PENDING  Incomplete  Blood culture (routine x 2)     Status: None (Preliminary result)   Collection Time: 09/17/18  1:39 PM  Result Value Ref Range Status   Specimen Description LEFT ANTECUBITAL  Final   Special Requests   Final    BOTTLES DRAWN AEROBIC AND ANAEROBIC Blood Culture adequate volume   Culture   Final     NO GROWTH < 24 HOURS Performed at [facility], [address] [place],  [address]    Report Status PENDING  Incomplete     Labs: BNP (last 3 results) No results for input(s): BNP in the last 8760 hours. Basic Metabolic Panel: Recent Labs  Lab 09/17/18 1339 09/18/18 0357  NA 134* 139  K 4.0 4.6  CL 92* 98  CO2 33* 33*  GLUCOSE 139* 243*  BUN 8 9  CREATININE 0.67 0.71  CALCIUM 9.1 9.1   Liver Function Tests: Recent Labs  Lab 09/18/18 0357  AST 22  ALT 30  ALKPHOS 55  BILITOT 0.5  PROT 7.6  ALBUMIN 3.8   No results for input(s): LIPASE, AMYLASE in the last 168 hours. No results for input(s): AMMONIA in the last 168 hours. CBC: Recent Labs  Lab 09/17/18 1339 09/18/18 0357  WBC 20.3* 17.9*  NEUTROABS 16.5*  --   HGB 14.3 13.7  HCT 43.7 42.3  MCV 96.7 97.0  PLT 268 245   Cardiac Enzymes: Recent Labs  Lab 09/17/18 1543 09/17/18 2142 09/18/18 0357  TROPONINI <0.03 <0.03 <0.03   BNP: Invalid input(s): POCBNP CBG: Recent Labs  Lab 09/17/18 1626 09/17/18 2036 09/18/18 0742 09/18/18 1149  GLUCAP 212* 121* 311* 263*   D-Dimer Recent Labs    09/17/18 1543  DDIMER 0.30   Hgb A1c No results for input(s): HGBA1C in the last 72 hours. Lipid Profile No results for input(s): CHOL, HDL, LDLCALC, TRIG, CHOLHDL, LDLDIRECT in the last 72 hours. Thyroid function studies No results for input(s): TSH, T4TOTAL, T3FREE, THYROIDAB in the last 72 hours.  Invalid input(s): FREET3 Anemia work up No results for input(s): VITAMINB12, FOLATE, FERRITIN, TIBC, IRON, RETICCTPCT in the last 72 hours. Urinalysis    Component Value Date/Time   COLORURINE YELLOW 06/21/2018 1233   APPEARANCEUR CLEAR 06/21/2018 1233   LABSPEC 1.019 06/21/2018 1233   PHURINE 5.0 06/21/2018 1233   GLUCOSEU NEGATIVE 06/21/2018 1233   HGBUR NEGATIVE 06/21/2018 1233   BILIRUBINUR NEGATIVE 06/21/2018 1233   KETONESUR NEGATIVE 06/21/2018 1233   PROTEINUR NEGATIVE 06/21/2018 1233    UROBILINOGEN 0.2 07/17/2014 1613   NITRITE NEGATIVE 06/21/2018 1233   LEUKOCYTESUR NEGATIVE  06/21/2018 1233   Sepsis Labs Invalid input(s): PROCALCITONIN,  WBC,  LACTICIDVEN Microbiology Recent Results (from the past 240 hour(s))  Blood culture (routine x 2)     Status: None (Preliminary result)   Collection Time: 09/17/18  1:39 PM  Result Value Ref Range Status   Specimen Description RIGHT ANTECUBITAL  Final   Special Requests   Final    BOTTLES DRAWN AEROBIC AND ANAEROBIC Blood Culture adequate volume   Culture   Final    NO GROWTH < 24 HOURS Performed at [facility], [address] [place], [place] [address]    Report Status PENDING  Incomplete  Blood culture (routine x 2)     Status: None (Preliminary result)   Collection Time: 09/17/18  1:39 PM  Result Value Ref Range Status   Specimen Description LEFT ANTECUBITAL  Final   Special Requests   Final    BOTTLES DRAWN AEROBIC AND ANAEROBIC Blood Culture adequate volume   Culture   Final    NO GROWTH < 24 HOURS Performed at [facility], [address] [place], [place] [address]    Report Status PENDING  Incomplete     Time coordinating discharge: 63mins  SIGNED:   Kathie Dike, MD  [facility] 09/18/2018, 12:38 PM Pager   If 7PM-7AM, please contact night-coverage [email or web address] Password [email or web address]

## 2018-09-23 LAB — CULTURE, BLOOD (ROUTINE X 2)
Culture: NO GROWTH
Culture: NO GROWTH
SPECIAL REQUESTS: ADEQUATE
Special Requests: ADEQUATE

## 2018-11-01 ENCOUNTER — Ambulatory Visit (INDEPENDENT_AMBULATORY_CARE_PROVIDER_SITE_OTHER): Payer: Medicare Other | Admitting: Otolaryngology

## 2018-11-01 DIAGNOSIS — H7013 Chronic mastoiditis, bilateral: Secondary | ICD-10-CM

## 2018-11-12 ENCOUNTER — Ambulatory Visit (INDEPENDENT_AMBULATORY_CARE_PROVIDER_SITE_OTHER): Payer: Medicare Other | Admitting: Otolaryngology

## 2018-11-12 DIAGNOSIS — H7012 Chronic mastoiditis, left ear: Secondary | ICD-10-CM | POA: Diagnosis not present

## 2018-11-26 ENCOUNTER — Ambulatory Visit (INDEPENDENT_AMBULATORY_CARE_PROVIDER_SITE_OTHER): Payer: Medicare Other | Admitting: Otolaryngology

## 2018-11-26 DIAGNOSIS — H7012 Chronic mastoiditis, left ear: Secondary | ICD-10-CM | POA: Diagnosis not present

## 2018-12-24 ENCOUNTER — Ambulatory Visit (INDEPENDENT_AMBULATORY_CARE_PROVIDER_SITE_OTHER): Payer: Medicare Other | Admitting: Otolaryngology

## 2018-12-24 DIAGNOSIS — H7012 Chronic mastoiditis, left ear: Secondary | ICD-10-CM

## 2019-01-28 ENCOUNTER — Ambulatory Visit (INDEPENDENT_AMBULATORY_CARE_PROVIDER_SITE_OTHER): Payer: Medicare Other | Admitting: Otolaryngology

## 2019-04-25 ENCOUNTER — Ambulatory Visit (INDEPENDENT_AMBULATORY_CARE_PROVIDER_SITE_OTHER): Payer: Medicare Other | Admitting: Otolaryngology

## 2019-05-13 ENCOUNTER — Ambulatory Visit (INDEPENDENT_AMBULATORY_CARE_PROVIDER_SITE_OTHER): Payer: Medicare Other | Admitting: Otolaryngology

## 2019-07-25 ENCOUNTER — Other Ambulatory Visit: Payer: Self-pay

## 2019-07-25 ENCOUNTER — Ambulatory Visit (INDEPENDENT_AMBULATORY_CARE_PROVIDER_SITE_OTHER): Payer: Medicare Other | Admitting: Otolaryngology

## 2019-07-25 DIAGNOSIS — H7012 Chronic mastoiditis, left ear: Secondary | ICD-10-CM | POA: Diagnosis not present

## 2019-09-26 ENCOUNTER — Other Ambulatory Visit: Payer: Self-pay

## 2019-09-26 ENCOUNTER — Ambulatory Visit (INDEPENDENT_AMBULATORY_CARE_PROVIDER_SITE_OTHER): Payer: Medicare Other | Admitting: Otolaryngology

## 2019-09-26 DIAGNOSIS — H7012 Chronic mastoiditis, left ear: Secondary | ICD-10-CM | POA: Diagnosis not present

## 2021-04-07 ENCOUNTER — Inpatient Hospital Stay (HOSPITAL_COMMUNITY)
Admission: EM | Admit: 2021-04-07 | Discharge: 2021-04-14 | DRG: 180 | Disposition: A | Discharge status: Home Health | Payer: Medicare Other | Attending: Student | Admitting: Student

## 2021-04-07 ENCOUNTER — Observation Stay (HOSPITAL_COMMUNITY): Payer: Medicare Other

## 2021-04-07 ENCOUNTER — Other Ambulatory Visit: Payer: Self-pay

## 2021-04-07 ENCOUNTER — Emergency Department (HOSPITAL_COMMUNITY): Payer: Medicare Other

## 2021-04-07 ENCOUNTER — Encounter (HOSPITAL_COMMUNITY): Payer: Self-pay | Admitting: Emergency Medicine

## 2021-04-07 DIAGNOSIS — E1165 Type 2 diabetes mellitus with hyperglycemia: Secondary | ICD-10-CM | POA: Diagnosis present

## 2021-04-07 DIAGNOSIS — G936 Cerebral edema: Secondary | ICD-10-CM | POA: Diagnosis present

## 2021-04-07 DIAGNOSIS — G8929 Other chronic pain: Secondary | ICD-10-CM | POA: Diagnosis present

## 2021-04-07 DIAGNOSIS — F17210 Nicotine dependence, cigarettes, uncomplicated: Secondary | ICD-10-CM | POA: Diagnosis present

## 2021-04-07 DIAGNOSIS — G131 Other systemic atrophy primarily affecting central nervous system in neoplastic disease: Secondary | ICD-10-CM | POA: Diagnosis not present

## 2021-04-07 DIAGNOSIS — E119 Type 2 diabetes mellitus without complications: Secondary | ICD-10-CM

## 2021-04-07 DIAGNOSIS — C3411 Malignant neoplasm of upper lobe, right bronchus or lung: Principal | ICD-10-CM | POA: Diagnosis present

## 2021-04-07 DIAGNOSIS — D499 Neoplasm of unspecified behavior of unspecified site: Secondary | ICD-10-CM

## 2021-04-07 DIAGNOSIS — Z20822 Contact with and (suspected) exposure to covid-19: Secondary | ICD-10-CM | POA: Diagnosis present

## 2021-04-07 DIAGNOSIS — C7931 Secondary malignant neoplasm of brain: Secondary | ICD-10-CM

## 2021-04-07 DIAGNOSIS — Z7401 Bed confinement status: Secondary | ICD-10-CM | POA: Diagnosis not present

## 2021-04-07 DIAGNOSIS — Z6829 Body mass index (BMI) 29.0-29.9, adult: Secondary | ICD-10-CM

## 2021-04-07 DIAGNOSIS — Y92009 Unspecified place in unspecified non-institutional (private) residence as the place of occurrence of the external cause: Secondary | ICD-10-CM | POA: Diagnosis not present

## 2021-04-07 DIAGNOSIS — I313 Pericardial effusion (noninflammatory): Secondary | ICD-10-CM | POA: Diagnosis present

## 2021-04-07 DIAGNOSIS — R599 Enlarged lymph nodes, unspecified: Secondary | ICD-10-CM

## 2021-04-07 DIAGNOSIS — R918 Other nonspecific abnormal finding of lung field: Secondary | ICD-10-CM

## 2021-04-07 DIAGNOSIS — T380X5A Adverse effect of glucocorticoids and synthetic analogues, initial encounter: Secondary | ICD-10-CM | POA: Diagnosis not present

## 2021-04-07 DIAGNOSIS — Z825 Family history of asthma and other chronic lower respiratory diseases: Secondary | ICD-10-CM

## 2021-04-07 DIAGNOSIS — Z79899 Other long term (current) drug therapy: Secondary | ICD-10-CM

## 2021-04-07 DIAGNOSIS — R531 Weakness: Secondary | ICD-10-CM | POA: Diagnosis present

## 2021-04-07 DIAGNOSIS — Z7951 Long term (current) use of inhaled steroids: Secondary | ICD-10-CM

## 2021-04-07 DIAGNOSIS — E876 Hypokalemia: Secondary | ICD-10-CM | POA: Diagnosis not present

## 2021-04-07 DIAGNOSIS — G8191 Hemiplegia, unspecified affecting right dominant side: Secondary | ICD-10-CM | POA: Diagnosis present

## 2021-04-07 DIAGNOSIS — R0602 Shortness of breath: Secondary | ICD-10-CM

## 2021-04-07 DIAGNOSIS — E663 Overweight: Secondary | ICD-10-CM | POA: Diagnosis present

## 2021-04-07 DIAGNOSIS — I639 Cerebral infarction, unspecified: Secondary | ICD-10-CM

## 2021-04-07 DIAGNOSIS — M5416 Radiculopathy, lumbar region: Secondary | ICD-10-CM | POA: Diagnosis not present

## 2021-04-07 DIAGNOSIS — E785 Hyperlipidemia, unspecified: Secondary | ICD-10-CM | POA: Diagnosis present

## 2021-04-07 DIAGNOSIS — I10 Essential (primary) hypertension: Secondary | ICD-10-CM | POA: Diagnosis not present

## 2021-04-07 DIAGNOSIS — J019 Acute sinusitis, unspecified: Secondary | ICD-10-CM | POA: Diagnosis present

## 2021-04-07 DIAGNOSIS — F419 Anxiety disorder, unspecified: Secondary | ICD-10-CM | POA: Diagnosis not present

## 2021-04-07 DIAGNOSIS — D72829 Elevated white blood cell count, unspecified: Secondary | ICD-10-CM

## 2021-04-07 DIAGNOSIS — F17200 Nicotine dependence, unspecified, uncomplicated: Secondary | ICD-10-CM | POA: Diagnosis present

## 2021-04-07 DIAGNOSIS — Z7984 Long term (current) use of oral hypoglycemic drugs: Secondary | ICD-10-CM

## 2021-04-07 DIAGNOSIS — J9610 Chronic respiratory failure, unspecified whether with hypoxia or hypercapnia: Secondary | ICD-10-CM | POA: Diagnosis not present

## 2021-04-07 DIAGNOSIS — H9190 Unspecified hearing loss, unspecified ear: Secondary | ICD-10-CM | POA: Diagnosis present

## 2021-04-07 DIAGNOSIS — W1830XA Fall on same level, unspecified, initial encounter: Secondary | ICD-10-CM | POA: Diagnosis not present

## 2021-04-07 DIAGNOSIS — J449 Chronic obstructive pulmonary disease, unspecified: Secondary | ICD-10-CM | POA: Diagnosis not present

## 2021-04-07 LAB — CBC WITH DIFFERENTIAL/PLATELET
Abs Immature Granulocytes: 0.06 10*3/uL (ref 0.00–0.07)
Basophils Absolute: 0.1 10*3/uL (ref 0.0–0.1)
Basophils Relative: 1 %
Eosinophils Absolute: 1 10*3/uL — ABNORMAL HIGH (ref 0.0–0.5)
Eosinophils Relative: 8 %
HCT: 46 % (ref 36.0–46.0)
Hemoglobin: 15 g/dL (ref 12.0–15.0)
Immature Granulocytes: 1 %
Lymphocytes Relative: 16 %
Lymphs Abs: 2 10*3/uL (ref 0.7–4.0)
MCH: 31.6 pg (ref 26.0–34.0)
MCHC: 32.6 g/dL (ref 30.0–36.0)
MCV: 96.8 fL (ref 80.0–100.0)
Monocytes Absolute: 1.1 10*3/uL — ABNORMAL HIGH (ref 0.1–1.0)
Monocytes Relative: 9 %
Neutro Abs: 8.4 10*3/uL — ABNORMAL HIGH (ref 1.7–7.7)
Neutrophils Relative %: 65 %
Platelets: 301 10*3/uL (ref 150–400)
RBC: 4.75 MIL/uL (ref 3.87–5.11)
RDW: 12.5 % (ref 11.5–15.5)
WBC: 12.5 10*3/uL — ABNORMAL HIGH (ref 4.0–10.5)
nRBC: 0 % (ref 0.0–0.2)

## 2021-04-07 LAB — COMPREHENSIVE METABOLIC PANEL
ALT: 28 U/L (ref 0–44)
AST: 27 U/L (ref 15–41)
Albumin: 3.9 g/dL (ref 3.5–5.0)
Alkaline Phosphatase: 58 U/L (ref 38–126)
Anion gap: 9 (ref 5–15)
BUN: 18 mg/dL (ref 8–23)
CO2: 35 mmol/L — ABNORMAL HIGH (ref 22–32)
Calcium: 9.3 mg/dL (ref 8.9–10.3)
Chloride: 90 mmol/L — ABNORMAL LOW (ref 98–111)
Creatinine, Ser: 0.63 mg/dL (ref 0.44–1.00)
GFR, Estimated: >60 mL/min (ref >60)
Glucose, Bld: 111 mg/dL — ABNORMAL HIGH (ref 70–99)
Potassium: 3.3 mmol/L — ABNORMAL LOW (ref 3.5–5.1)
Sodium: 134 mmol/L — ABNORMAL LOW (ref 135–145)
Total Bilirubin: 0.5 mg/dL (ref 0.3–1.2)
Total Protein: 8.2 g/dL — ABNORMAL HIGH (ref 6.5–8.1)

## 2021-04-07 LAB — ETHANOL: Alcohol, Ethyl (B): <10 mg/dL (ref <10)

## 2021-04-07 IMAGING — DX DG CHEST 1V PORT
1 series · 1 of 1 positions shown · non-contrast
Comparison: [DATE]

CLINICAL DATA: Cough

EXAM:
PORTABLE CHEST 1 VIEW

[chest ap]
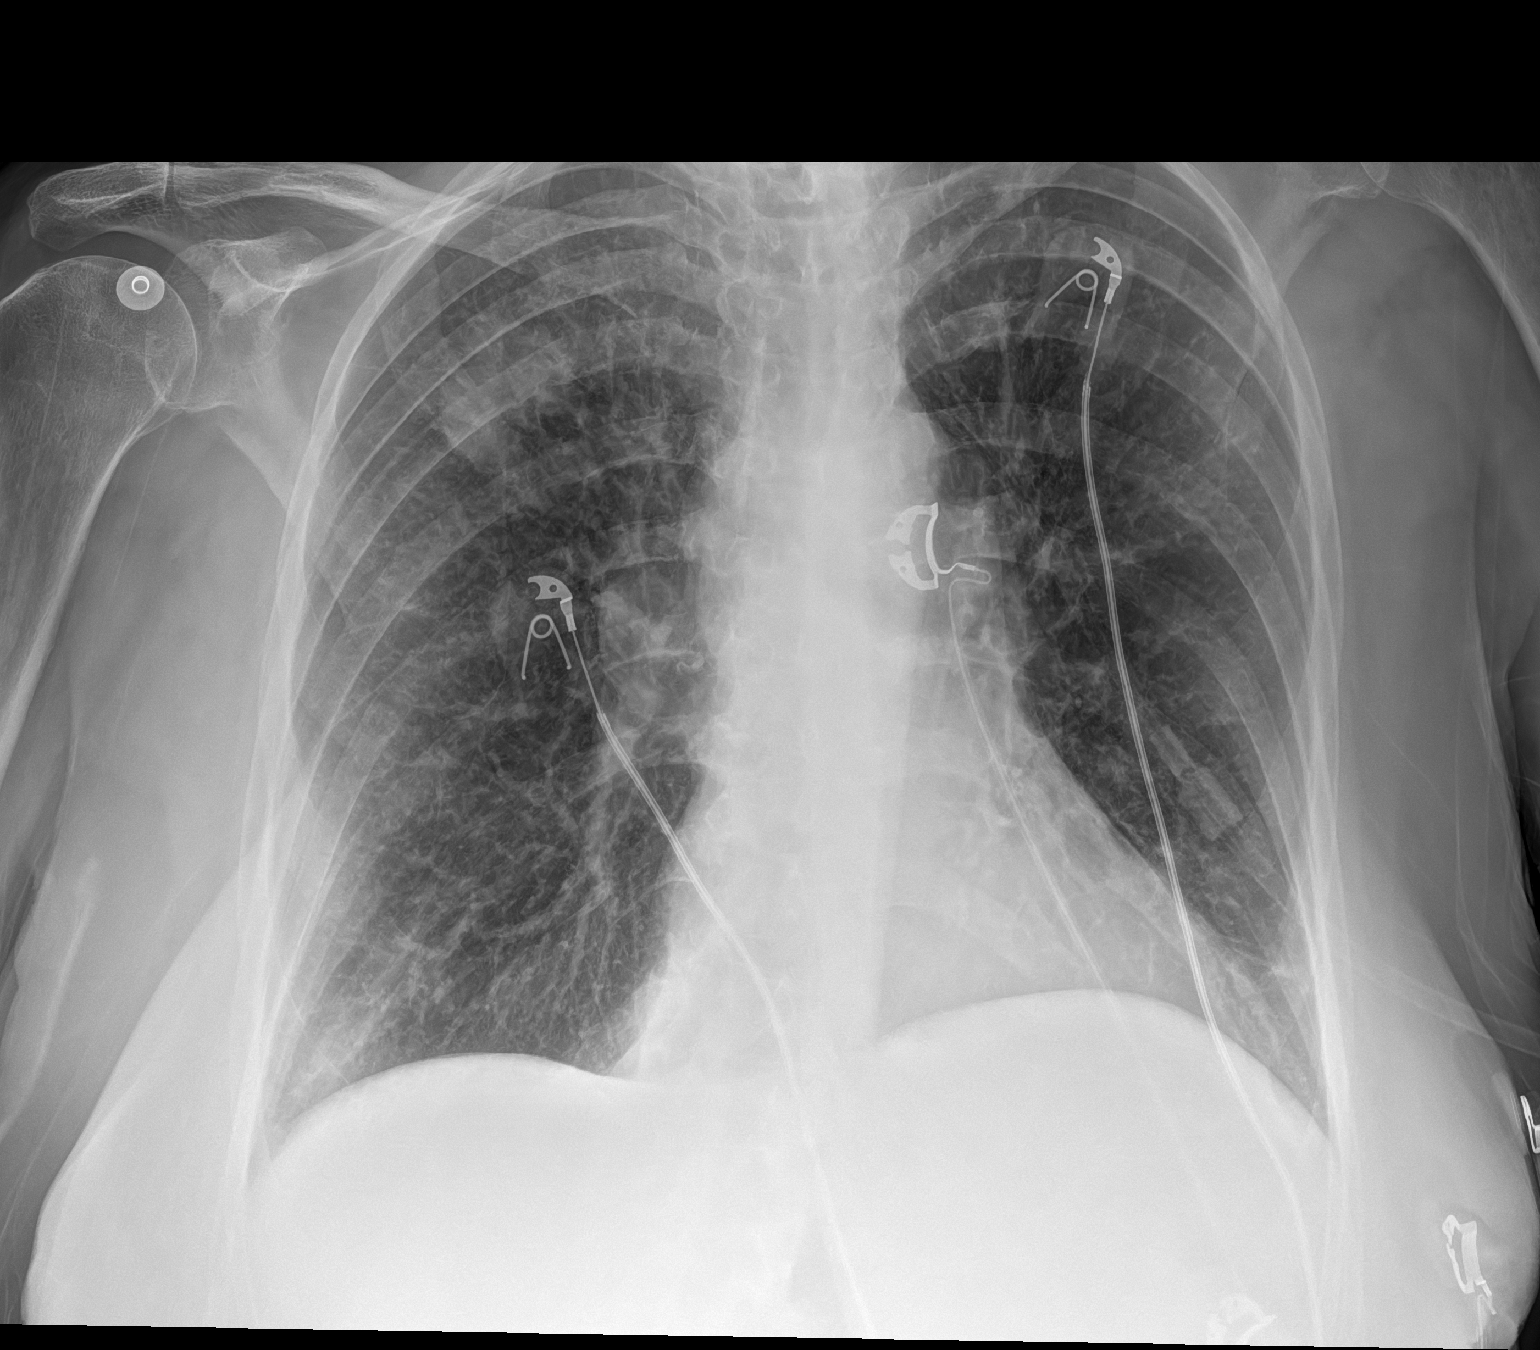

[1 of 1 positions shown; findings below may reference images not displayed]

FINDINGS: The lungs are symmetrically well expanded. A 3.6 cm mass is
developed within the right upper lobe. No pneumothorax or pleural
effusion. Cardiac size within normal limits. Pulmonary vascularity
is normal. No acute bone abnormality.
IMPRESSION: Interval development of a 3.6 cm mass within the right upper lobe.
Dedicated CT imaging is recommended for further evaluation.

## 2021-04-07 IMAGING — CT CT HEAD W/O CM
3 of 6 series · 15 of 47 positions shown, 18 images · non-contrast
Comparison: Head CT [DATE]

CLINICAL DATA: Right arm weakness for the past week.

EXAM:
CT HEAD WITHOUT CONTRAST
TECHNIQUE: Contiguous axial images were obtained from the base of the skull
through the vertex without intravenous contrast.

[Series 2: head w o · axial · 0.39mm/px · z∈[+1581,+1706]mm · 10 of 31 slices shown, 13 images]
[im 3/31  brain]
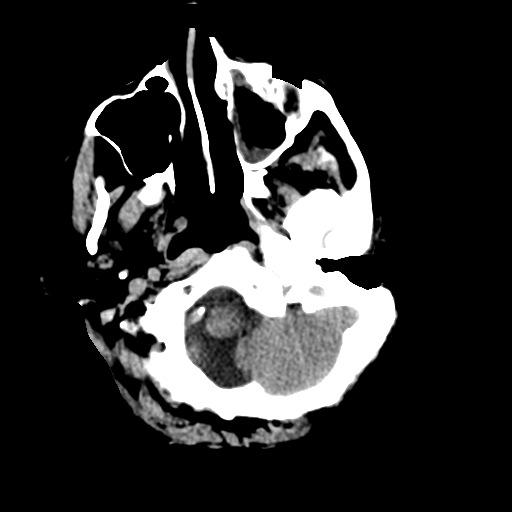
[im 3/31  bone]
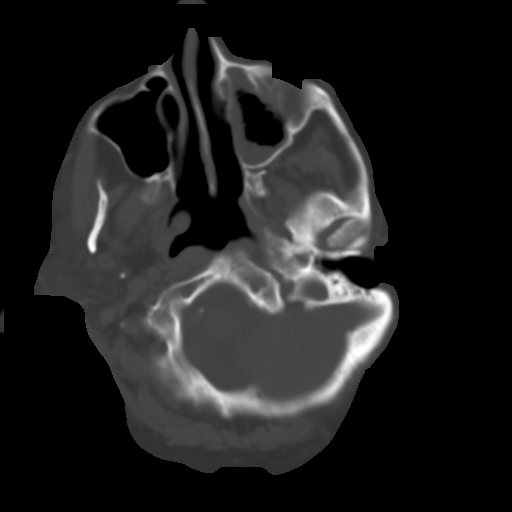
[im 5/31  brain]
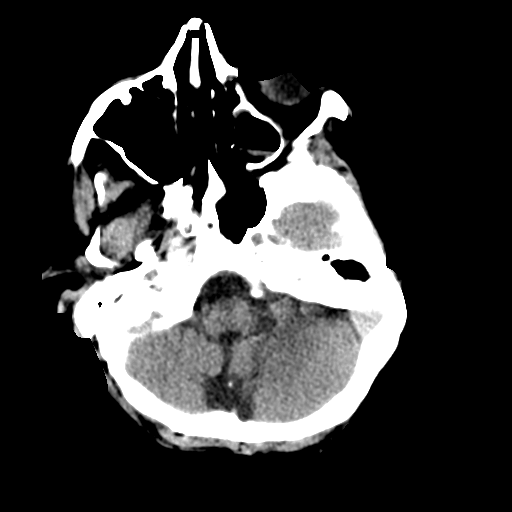
[im 9/31  brain]
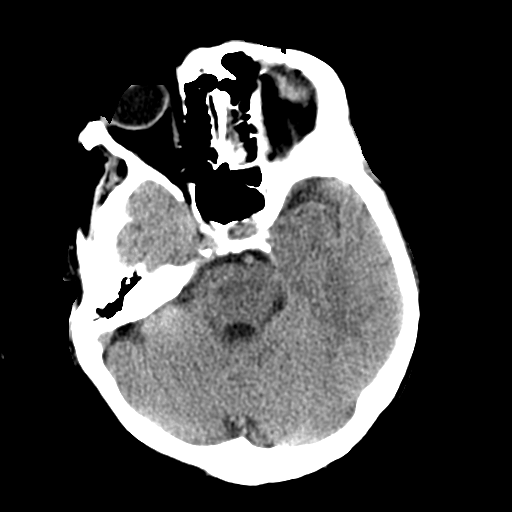
[im 11/31  brain]
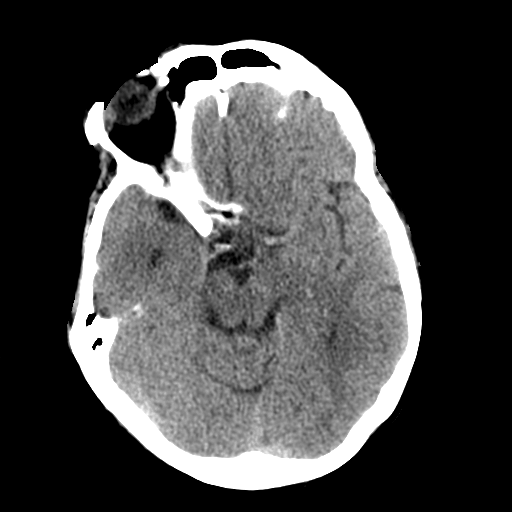
[im 13/31  brain]
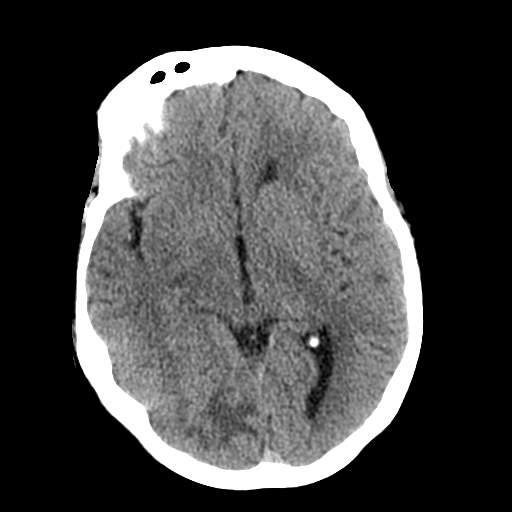
[im 13/31  bone]
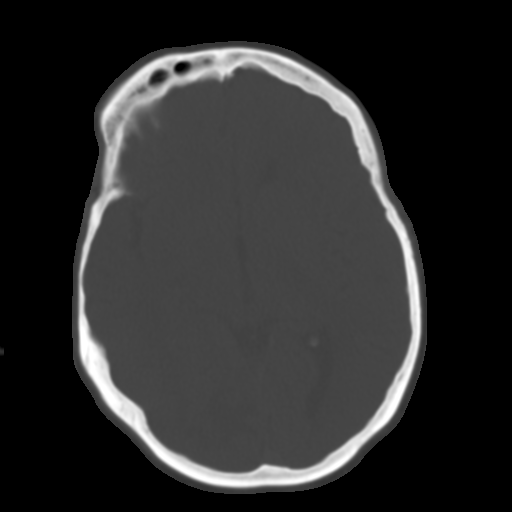
[im 18/31  brain]
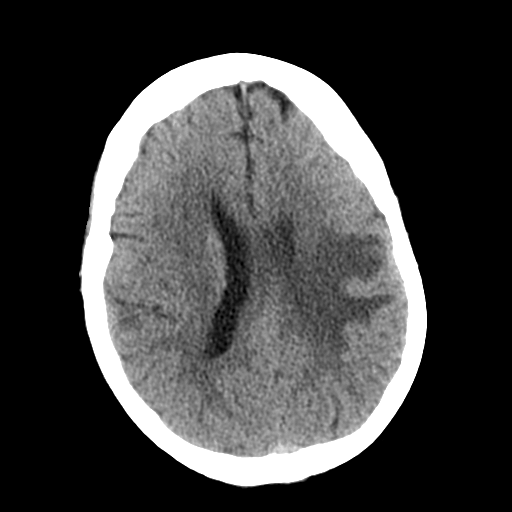
[im 20/31  brain]
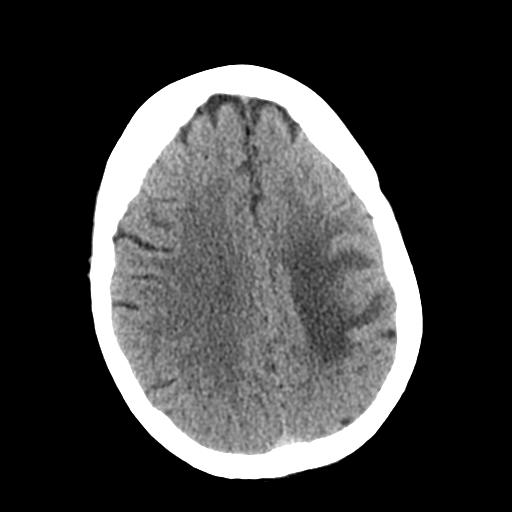
[im 22/31  brain]
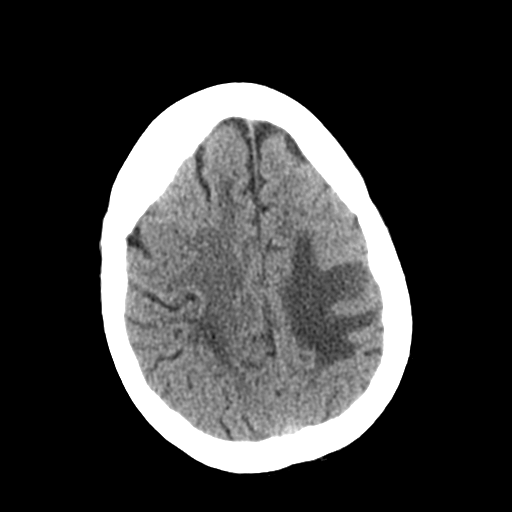
[im 26/31  brain]
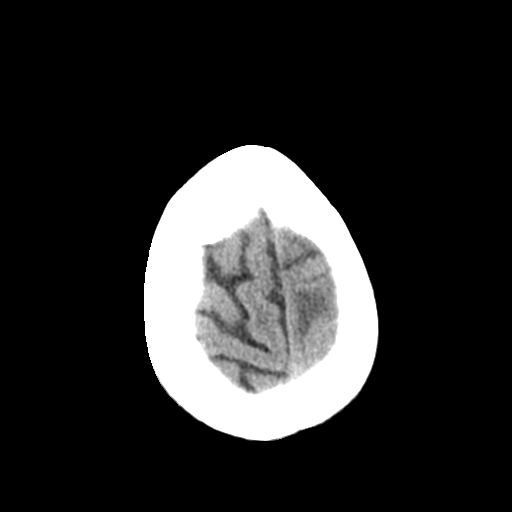
[im 26/31  bone]
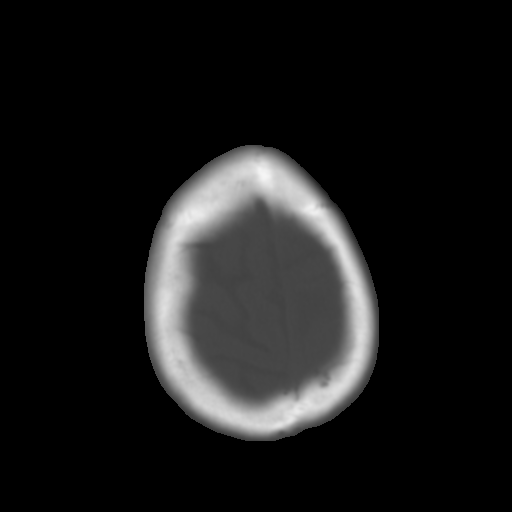
[im 28/31  brain]
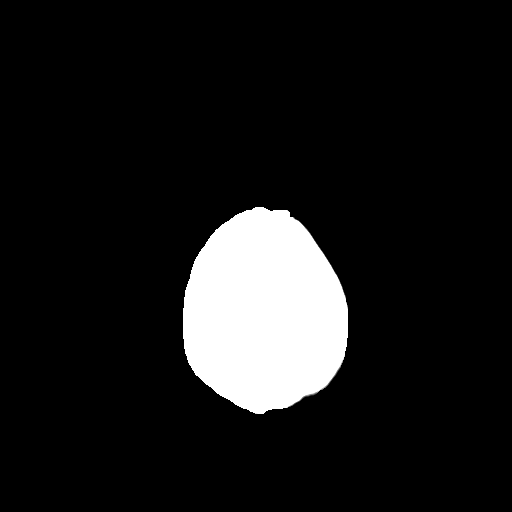

[Series 4: coronal soft · coronal · 0.30mm/px · 3 of 63 slices shown]
[im 16/63  brain]
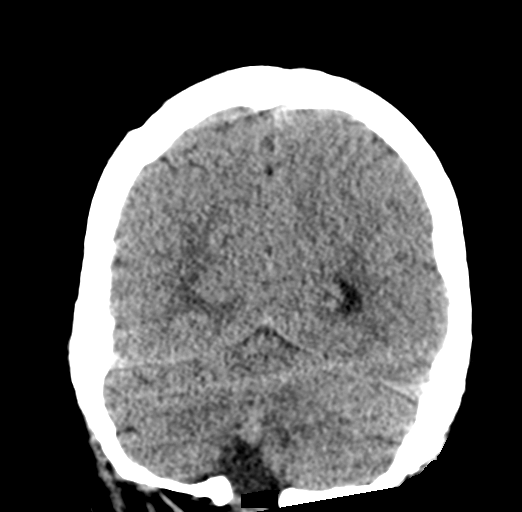
[im 32/63  brain]
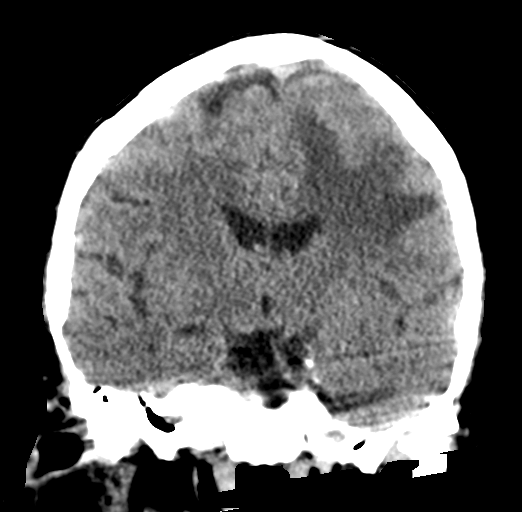
[im 47/63  brain]
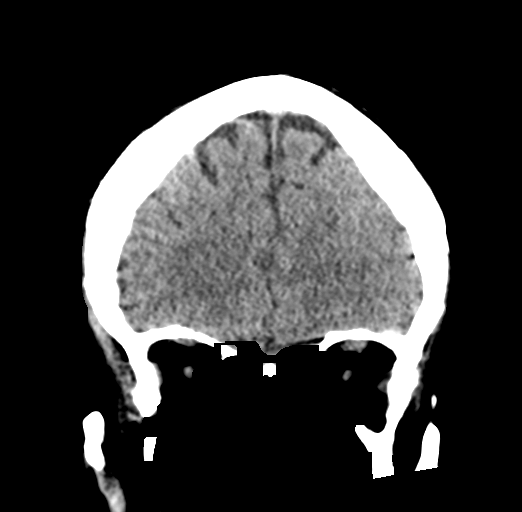

[Series 5: sagittal soft · sagittal · 0.30mm/px · 2 of 48 slices shown]
[im 16/48  brain]
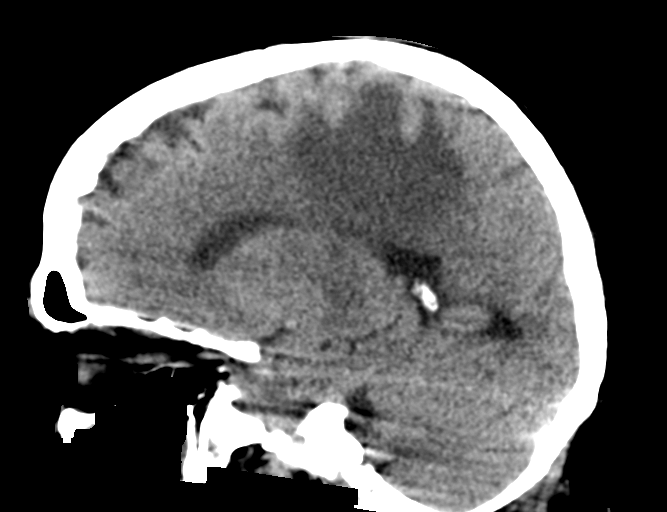
[im 32/48  brain]
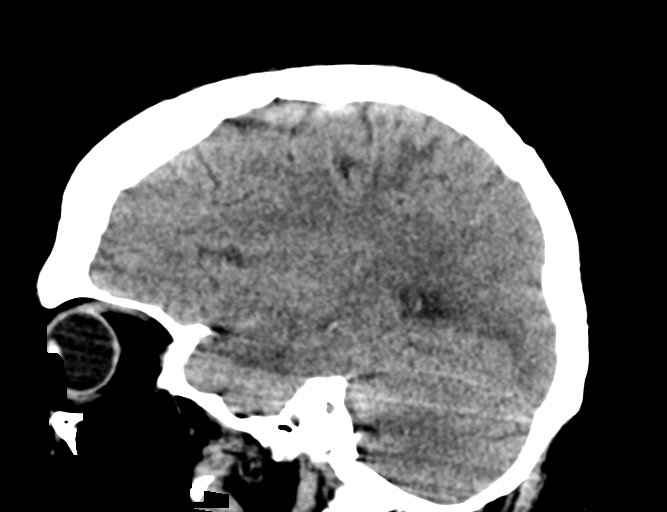

[15 of 47 positions shown; findings below may reference images not displayed]

FINDINGS: Brain: Moderately large area of low-density in the subcortical white
matter of the left frontal lobe suspicious for subacute infarct.
Small area of subcortical low-density involving the posterior right
frontal lobe may also represent subacute ischemia. No hemorrhage.
Findings are new from [1P] exam. No hydrocephalus or midline shift.
No subdural or extra-axial collection.

Vascular: There is high-density within both the left and right MCAs,
however this is more prominent on the left.

Skull: No fracture or focal lesion.

Sinuses/Orbits: Left maxillary sinus mucosal thickening with bubbly
debris and fluid level. Prior left mastoidectomy. Opacification of
right mastoid air cells.

Other: None.
IMPRESSION: 1. Moderately large area of low-density in the subcortical white
matter of the left frontal lobe suspicious for subacute infarct.
Small area of subcortical low-density involving the posterior right
frontal lobe may also represent subacute ischemia. Findings are new
from [1P] exam. Consider MRI for confirmation.
2. High-density within both the left and right MCAs, however this is
more prominent on the left. CTA or MRA recommended.
3. Left maxillary sinus mucosal thickening with bubbly debris and
fluid level, can be seen with acute sinusitis.

## 2021-04-07 NOTE — ED Triage Notes (Signed)
Pt c/o right arm weakness for past week. States she woke up that morning like that. Denies CVA hx.

## 2021-04-07 NOTE — ED Provider Notes (Signed)
[facility] EMERGENCY DEPARTMENT Provider Note   CSN: [id number] Arrival date & time: 04/07/21  1915     History Chief Complaint  Patient presents with  . Weakness    Tammy Blair is a 69 y.o. female.  HPI She presents for evaluation of weak right arm but not weak leg, for 1 week.  She also states she fell and might of injured her right arm.  She is a vague historian and cannot give me much more specific detail.  She denies recent illnesses including fever, vomiting, dizziness or paresthesia.  She denies having had this problem previously.  There are no other known active modifying factors.    Past Medical History:  Diagnosis Date  . Anxiety   . Asthma   . Chronic back pain   . COPD (chronic obstructive pulmonary disease) ([place])    on home O2  . Diabetes mellitus without complication ([place])   . HOH (hard of hearing)   . Hypertension   . Lumbar radiculopathy     Patient Active Problem List   Diagnosis Date Noted  . Fever 09/17/2018  . Type 2 diabetes mellitus without complication ([place]) 71/05/2693  . COPD (chronic obstructive pulmonary disease) ([place]) 06/21/2018  . Acute metabolic encephalopathy 85/46/2703  . Acute and chronic respiratory failure with hypercapnia ([place])   . Sepsis secondary to UTI ([place]) 10/07/2017  . Tachycardia 10/07/2017  . Cocaine abuse ([place]) 10/07/2017  . Acute respiratory failure with hypercapnia ([place]) 08/11/2016  . Diabetes mellitus ([place]) 08/11/2016  . HOH (hard of hearing) 08/22/2013  . Hyperglycemia 08/21/2013  . COPD exacerbation ([place]) 02/18/2013  . GAD (generalized anxiety disorder) 02/18/2013  . Chronic pain syndrome 02/18/2013  . Tobacco use disorder 02/18/2013  . Chronic respiratory failure ([place]) 02/18/2013  . Obesity, unspecified 02/18/2013  . Essential hypertension 04/29/2009  . COPD UNSPECIFIED 04/29/2009  . WEIGHT GAIN, ABNORMAL 04/29/2009    Past Surgical History:  Procedure Laterality Date  . CESAREAN SECTION    . TONSILLECTOMY        OB History   No obstetric history on file.     Family History  Problem Relation Age of Onset  . COPD Mother   . Cancer Mother        kidney  . Stroke Brother   . Asthma Sister     Social History   Tobacco Use  . Smoking status: Current Every Day Smoker    Packs/day: 0.50    Years: 40.00    Pack years: 20.00    Types: Cigarettes  . Smokeless tobacco: Never Used  Vaping Use  . Vaping Use: Never used  Substance Use Topics  . Alcohol use: No  . Drug use: Not Currently    Home Medications Prior to Admission medications   Medication Sig Start Date End Date Taking? Authorizing Provider  albuterol (PROVENTIL HFA;VENTOLIN HFA) 108 (90 Base) MCG/ACT inhaler Inhale 1-2 puffs into the lungs every 4 (four) hours as needed for wheezing or shortness of breath. 03/14/16  Yes Mesner, Corene Cornea, MD  albuterol (PROVENTIL) (2.5 MG/3ML) 0.083% nebulizer solution Take 3 mLs (2.5 mg total) by nebulization every 4 (four) hours as needed. For shortness of breath 06/23/18  Yes Kathie Dike, MD  atorvastatin (LIPITOR) 10 MG tablet Take 10 mg by mouth daily. 12/10/20  Yes [provider]  budesonide-formoterol (SYMBICORT) 160-4.5 MCG/ACT inhaler Inhale 1 puff into the lungs 2 (two) times daily. 06/23/18  Yes Kathie Dike, MD  chlorthalidone (HYGROTON) 25 MG tablet Take 25  mg by mouth daily. With food 12/10/20  Yes [provider]  cyclobenzaprine (FLEXERIL) 10 MG tablet Take 0.5 tablets (5 mg total) by mouth 3 (three) times daily as needed for muscle spasms. For muscle spasm 10/12/17  Yes Kathie Dike, MD  diltiazem (CARDIZEM) 60 MG tablet Take 60 mg by mouth 2 (two) times daily.   Yes [provider]  famotidine (PEPCID) 20 MG tablet Take 20 mg by mouth daily. 04/22/20  Yes [provider]  gabapentin (NEURONTIN) 300 MG capsule Take 400 mg by mouth 3 (three) times daily.   Yes [provider]  malathion (OVIDE) 0.5 % lotion Apply topically. 03/29/21   Yes [provider]  metFORMIN (GLUCOPHAGE-XR) 500 MG 24 hr tablet Take 500 mg by mouth in the morning and at bedtime. With supper.   Yes [provider]  metoprolol tartrate (LOPRESSOR) 100 MG tablet Take 1 tablet by mouth 2 (two) times daily. 03/23/18  Yes [provider]  ALPRAZolam Duanne Moron) 0.5 MG tablet Take 0.5 mg by mouth 3 (three) times daily as needed for anxiety. For anxiety Patient not taking: No sig reported    [provider]  naproxen (NAPROSYN) 500 MG tablet Take 1 tablet (500 mg total) by mouth 2 (two) times daily with a meal. Patient not taking: No sig reported 08/20/17   Noemi Chapel, MD  Oxycodone HCl 10 MG TABS Take 0.5 tablets (5 mg total) by mouth 4 (four) times daily as needed. Patient not taking: No sig reported 10/12/17   Kathie Dike, MD  predniSONE (DELTASONE) 10 MG tablet Take 40mg  po daily for 2 days then 30mg  daily for 2 days then 20mg  daily for 2 days then 10mg  daily for 2 days then stop Patient not taking: No sig reported 09/18/18   Kathie Dike, MD  ranitidine (ZANTAC) 150 MG tablet Take 150 mg by mouth 2 (two) times daily. Patient not taking: No sig reported    [provider]  tiotropium (SPIRIVA) 18 MCG inhalation capsule Place 1 capsule (18 mcg total) into inhaler and inhale daily. Patient not taking: No sig reported 06/23/18   Kathie Dike, MD  TRIAMCINOLONE ACETONIDE, TOP, 0.05 % OINT Apply 1 application topically 2 (two) times daily as needed. Patient not taking: No sig reported 12/28/15   [provider]  valACYclovir (VALTREX) 1000 MG tablet TAKE (1) TABLET BY MOUTH (3) TIMES DAILY. Patient not taking: No sig reported 02/16/20   [provider]    Allergies    Patient has no known allergies.  Review of Systems   Review of Systems  All other systems reviewed and are negative.   Physical Exam Updated Vital Signs BP 133/74   Pulse 71   Temp 98.2 F (36.8 C) (Oral)   Resp 18   Ht  5\' 9"  (1.753 m)   Wt 91.6 kg   SpO2 95%   BMI 29.82 kg/m   Physical Exam Vitals and nursing note reviewed.  Constitutional:      General: She is not in acute distress.    Appearance: She is well-developed. She is not ill-appearing, toxic-appearing or diaphoretic.  HENT:     Head: Normocephalic and atraumatic.     Right Ear: External ear normal.     Left Ear: External ear normal.  Eyes:     Conjunctiva/sclera: Conjunctivae normal.     Pupils: Pupils are equal, round, and reactive to light.  Neck:     Trachea: Phonation normal.  Cardiovascular:  Rate and Rhythm: Normal rate and regular rhythm.     Heart sounds: Normal heart sounds.  Pulmonary:     Effort: Pulmonary effort is normal. No respiratory distress.     Breath sounds: Normal breath sounds. No stridor.  Abdominal:     General: There is no distension.     Palpations: Abdomen is soft.     Tenderness: There is no abdominal tenderness.  Musculoskeletal:        General: Normal range of motion.     Cervical back: Normal range of motion and neck supple.  Skin:    General: Skin is warm and dry.  Neurological:     Mental Status: She is alert.     Cranial Nerves: No cranial nerve deficit.     Motor: No abnormal muscle tone.     Coordination: Coordination normal.     Comments: Right strength 2/5 as compared to 5/5 on the left.  Normal strength both legs bilaterally.  No dysarthria, aphasia or nystagmus.  Psychiatric:        Mood and Affect: Mood normal.        Behavior: Behavior normal.     ED Results / Procedures / Treatments   Labs (all labs ordered are listed, but only abnormal results are displayed) Labs Reviewed  COMPREHENSIVE METABOLIC PANEL - Abnormal; Notable for the following components:      Result Value   Sodium 134 (*)    Potassium 3.3 (*)    Chloride 90 (*)    CO2 35 (*)    Glucose, Bld 111 (*)    Total Protein 8.2 (*)    All other components within normal limits  CBC WITH DIFFERENTIAL/PLATELET -  Abnormal; Notable for the following components:   WBC 12.5 (*)    Neutro Abs 8.4 (*)    Monocytes Absolute 1.1 (*)    Eosinophils Absolute 1.0 (*)    All other components within normal limits  RESP PANEL BY RT-PCR (FLU A&B, COVID) ARPGX2  ETHANOL  URINALYSIS, ROUTINE W REFLEX MICROSCOPIC    EKG EKG Interpretation  Date/Time:  Wednesday April 07 2021 19:24:15 EDT Ventricular Rate:  67 PR Interval:  183 QRS Duration: 90 QT Interval:  403 QTC Calculation: 426 R Axis:   96 Text Interpretation: Sinus rhythm Consider left atrial enlargement Anterolateral infarct, old Baseline wander in lead(s) I III aVL V3 since last tracing no significant change Confirmed by Daleen Bo [phone]) on 04/07/2021 8:29:11 PM   Radiology CT Head Wo Contrast  Result Date: 04/07/2021 CLINICAL DATA:  Right arm weakness for the past week. EXAM: CT HEAD WITHOUT CONTRAST TECHNIQUE: Contiguous axial images were obtained from the base of the skull through the vertex without intravenous contrast. COMPARISON:  Head CT 10/07/2017 FINDINGS: Brain: Moderately large area of low-density in the subcortical white matter of the left frontal lobe suspicious for subacute infarct. Small area of subcortical low-density involving the posterior right frontal lobe may also represent subacute ischemia. No hemorrhage. Findings are new from 2018 exam. No hydrocephalus or midline shift. No subdural or extra-axial collection. Vascular: There is high-density within both the left and right MCAs, however this is more prominent on the left. Skull: No fracture or focal lesion. Sinuses/Orbits: Left maxillary sinus mucosal thickening with bubbly debris and fluid level. Prior left mastoidectomy. Opacification of right mastoid air cells. Other: None. IMPRESSION: 1. Moderately large area of low-density in the subcortical white matter of the left frontal lobe suspicious for subacute infarct. Small area of subcortical  low-density involving the posterior  right frontal lobe may also represent subacute ischemia. Findings are new from 2018 exam. Consider MRI for confirmation. 2. High-density within both the left and right MCAs, however this is more prominent on the left. CTA or MRA recommended. 3. Left maxillary sinus mucosal thickening with bubbly debris and fluid level, can be seen with acute sinusitis. Electronically Signed   By: Keith Rake M.D.   On: 04/07/2021 21:30    Procedures Procedures   Medications Ordered in ED Medications - No data to display  ED Course  I have reviewed the triage vital signs and the nursing notes.  Pertinent labs & imaging results that were available during my care of the patient were reviewed by me and considered in my medical decision making (see chart for details).    MDM Rules/Calculators/A&P                           Patient Vitals for the past 24 hrs:  BP Temp Temp src Pulse Resp SpO2 Height Weight  04/07/21 2230 133/74 -- -- 71 18 95 % -- --  04/07/21 2200 138/71 -- -- 66 -- 96 % -- --  04/07/21 2154 (!) 143/104 -- -- 67 19 93 % -- --  04/07/21 2100 136/70 -- -- 65 (!) 21 94 % -- --  04/07/21 2030 127/71 -- -- 66 16 96 % -- --  04/07/21 2000 110/80 -- -- 70 18 94 % -- --  04/07/21 1923 135/83 98.2 F (36.8 C) Oral 67 19 95 % 5\' 9"  (1.753 m) 91.6 kg    10:25 PM Reevaluation with update and discussion. After initial assessment and treatment, an updated evaluation reveals no change in clinical status. Daleen Bo   Medical Decision Making:  This patient is presenting for evaluation of isolated weakness of right arm, which does require a range of treatment options, and is a complaint that involves a high risk of morbidity and mortality. The differential diagnoses include CVA, neuropathy, acute illness. I decided to review old records, and in summary elderly female, presenting from home, for right arm weakness for 1 week.  I did not require additional historical information from  anyone.  Clinical Laboratory Tests Ordered, included CBC, Metabolic panel and Viral panel. Review indicates normal except sodium low, potassium low, chloride low, CO2 high, glucose high, total protein high, white count normal.  Alcohol level normal. Radiologic Tests Ordered, included CT head.  I independently Visualized: Radiographic images, which show subacute    Critical Interventions-clinical evaluation, laboratory testing, CT imaging, observation reassess  After These Interventions, the Patient was reevaluated and was found with new left brain stroke causing right-sided weakness in your arm.  Symptom onset about a week ago.  Patient is a somewhat poor historian.  Incidental findings, of mild hypokalemia patient will require hospitalization for further care and treatment for  CRITICAL CARE-no Performed by: Daleen Bo  Nursing Notes Reviewed/ Care Coordinated Applicable Imaging Reviewed Interpretation of Laboratory Data incorporated into ED treatment   10:45 PM-Consult complete with hospital. Patient case explained and discussed.  She agrees to admit patient for further evaluation and treatment. Call ended at 10:54 PM    Final Clinical Impression(s) / ED Diagnoses Final diagnoses:  Cerebrovascular accident (CVA), unspecified mechanism ([place])    Rx / [place] Orders ED Discharge Orders    None       Daleen Bo, MD 04/07/21 2256

## 2021-04-07 NOTE — ED Notes (Signed)
Patient refuses covid test

## 2021-04-08 ENCOUNTER — Observation Stay (HOSPITAL_COMMUNITY): Payer: Medicare Other

## 2021-04-08 ENCOUNTER — Inpatient Hospital Stay (HOSPITAL_COMMUNITY): Payer: Medicare Other

## 2021-04-08 ENCOUNTER — Encounter (HOSPITAL_COMMUNITY): Payer: Self-pay | Admitting: Family Medicine

## 2021-04-08 ENCOUNTER — Other Ambulatory Visit (HOSPITAL_COMMUNITY): Payer: Medicare Other

## 2021-04-08 DIAGNOSIS — G131 Other systemic atrophy primarily affecting central nervous system in neoplastic disease: Secondary | ICD-10-CM | POA: Diagnosis present

## 2021-04-08 DIAGNOSIS — J019 Acute sinusitis, unspecified: Secondary | ICD-10-CM | POA: Diagnosis not present

## 2021-04-08 DIAGNOSIS — F17210 Nicotine dependence, cigarettes, uncomplicated: Secondary | ICD-10-CM

## 2021-04-08 DIAGNOSIS — I6389 Other cerebral infarction: Secondary | ICD-10-CM | POA: Diagnosis not present

## 2021-04-08 DIAGNOSIS — G936 Cerebral edema: Secondary | ICD-10-CM | POA: Diagnosis present

## 2021-04-08 DIAGNOSIS — C7931 Secondary malignant neoplasm of brain: Secondary | ICD-10-CM

## 2021-04-08 DIAGNOSIS — R918 Other nonspecific abnormal finding of lung field: Secondary | ICD-10-CM

## 2021-04-08 DIAGNOSIS — Z20822 Contact with and (suspected) exposure to covid-19: Secondary | ICD-10-CM | POA: Diagnosis present

## 2021-04-08 DIAGNOSIS — C801 Malignant (primary) neoplasm, unspecified: Secondary | ICD-10-CM

## 2021-04-08 DIAGNOSIS — Y92009 Unspecified place in unspecified non-institutional (private) residence as the place of occurrence of the external cause: Secondary | ICD-10-CM | POA: Diagnosis not present

## 2021-04-08 DIAGNOSIS — F419 Anxiety disorder, unspecified: Secondary | ICD-10-CM | POA: Diagnosis present

## 2021-04-08 DIAGNOSIS — Z825 Family history of asthma and other chronic lower respiratory diseases: Secondary | ICD-10-CM | POA: Diagnosis not present

## 2021-04-08 DIAGNOSIS — E663 Overweight: Secondary | ICD-10-CM | POA: Diagnosis present

## 2021-04-08 DIAGNOSIS — G8929 Other chronic pain: Secondary | ICD-10-CM | POA: Diagnosis present

## 2021-04-08 DIAGNOSIS — Z7189 Other specified counseling: Secondary | ICD-10-CM | POA: Diagnosis not present

## 2021-04-08 DIAGNOSIS — C3411 Malignant neoplasm of upper lobe, right bronchus or lung: Secondary | ICD-10-CM | POA: Diagnosis present

## 2021-04-08 DIAGNOSIS — R0602 Shortness of breath: Secondary | ICD-10-CM

## 2021-04-08 DIAGNOSIS — D72829 Elevated white blood cell count, unspecified: Secondary | ICD-10-CM | POA: Diagnosis present

## 2021-04-08 DIAGNOSIS — G8191 Hemiplegia, unspecified affecting right dominant side: Secondary | ICD-10-CM | POA: Diagnosis present

## 2021-04-08 DIAGNOSIS — I313 Pericardial effusion (noninflammatory): Secondary | ICD-10-CM | POA: Diagnosis present

## 2021-04-08 DIAGNOSIS — J9610 Chronic respiratory failure, unspecified whether with hypoxia or hypercapnia: Secondary | ICD-10-CM | POA: Diagnosis present

## 2021-04-08 DIAGNOSIS — E876 Hypokalemia: Secondary | ICD-10-CM | POA: Diagnosis present

## 2021-04-08 DIAGNOSIS — I639 Cerebral infarction, unspecified: Secondary | ICD-10-CM | POA: Diagnosis present

## 2021-04-08 DIAGNOSIS — H9190 Unspecified hearing loss, unspecified ear: Secondary | ICD-10-CM | POA: Diagnosis not present

## 2021-04-08 DIAGNOSIS — E119 Type 2 diabetes mellitus without complications: Secondary | ICD-10-CM

## 2021-04-08 DIAGNOSIS — R531 Weakness: Secondary | ICD-10-CM | POA: Diagnosis present

## 2021-04-08 DIAGNOSIS — W1830XA Fall on same level, unspecified, initial encounter: Secondary | ICD-10-CM | POA: Diagnosis present

## 2021-04-08 DIAGNOSIS — Z515 Encounter for palliative care: Secondary | ICD-10-CM | POA: Diagnosis not present

## 2021-04-08 DIAGNOSIS — C3491 Malignant neoplasm of unspecified part of right bronchus or lung: Secondary | ICD-10-CM | POA: Diagnosis not present

## 2021-04-08 DIAGNOSIS — T380X5A Adverse effect of glucocorticoids and synthetic analogues, initial encounter: Secondary | ICD-10-CM | POA: Diagnosis present

## 2021-04-08 DIAGNOSIS — R599 Enlarged lymph nodes, unspecified: Secondary | ICD-10-CM | POA: Diagnosis not present

## 2021-04-08 DIAGNOSIS — M5416 Radiculopathy, lumbar region: Secondary | ICD-10-CM | POA: Diagnosis present

## 2021-04-08 DIAGNOSIS — J449 Chronic obstructive pulmonary disease, unspecified: Secondary | ICD-10-CM | POA: Diagnosis present

## 2021-04-08 DIAGNOSIS — Z7401 Bed confinement status: Secondary | ICD-10-CM | POA: Diagnosis not present

## 2021-04-08 DIAGNOSIS — I10 Essential (primary) hypertension: Secondary | ICD-10-CM | POA: Diagnosis present

## 2021-04-08 DIAGNOSIS — E785 Hyperlipidemia, unspecified: Secondary | ICD-10-CM | POA: Diagnosis present

## 2021-04-08 DIAGNOSIS — F17200 Nicotine dependence, unspecified, uncomplicated: Secondary | ICD-10-CM

## 2021-04-08 DIAGNOSIS — E1165 Type 2 diabetes mellitus with hyperglycemia: Secondary | ICD-10-CM | POA: Diagnosis present

## 2021-04-08 DIAGNOSIS — Z6829 Body mass index (BMI) 29.0-29.9, adult: Secondary | ICD-10-CM | POA: Diagnosis not present

## 2021-04-08 LAB — GLUCOSE, CAPILLARY
Glucose-Capillary: 117 mg/dL — ABNORMAL HIGH (ref 70–99)
Glucose-Capillary: 105 mg/dL — ABNORMAL HIGH (ref 70–99)
Glucose-Capillary: 127 mg/dL — ABNORMAL HIGH (ref 70–99)
Glucose-Capillary: 245 mg/dL — ABNORMAL HIGH (ref 70–99)
Glucose-Capillary: 197 mg/dL — ABNORMAL HIGH (ref 70–99)

## 2021-04-08 LAB — URINALYSIS, ROUTINE W REFLEX MICROSCOPIC
Bilirubin Urine: NEGATIVE
Glucose, UA: NEGATIVE mg/dL
Hgb urine dipstick: NEGATIVE
Ketones, ur: NEGATIVE mg/dL
Nitrite: NEGATIVE
Protein, ur: NEGATIVE mg/dL
Specific Gravity, Urine: 1.012 (ref 1.005–1.030)
pH: 6 (ref 5.0–8.0)

## 2021-04-08 LAB — VITAMIN B12: Vitamin B-12: 481 pg/mL (ref 180–914)

## 2021-04-08 LAB — HEMOGLOBIN A1C
Hgb A1c MFr Bld: 6.3 % — ABNORMAL HIGH (ref 4.8–5.6)
Mean Plasma Glucose: 134.11 mg/dL

## 2021-04-08 LAB — RESP PANEL BY RT-PCR (FLU A&B, COVID) ARPGX2
Influenza A by PCR: NEGATIVE
Influenza B by PCR: NEGATIVE
SARS Coronavirus 2 by RT PCR: NEGATIVE

## 2021-04-08 LAB — TSH: TSH: 6.632 u[IU]/mL — ABNORMAL HIGH (ref 0.350–4.500)

## 2021-04-08 LAB — LIPID PANEL
Cholesterol: 126 mg/dL (ref 0–200)
HDL: 41 mg/dL (ref >40)
LDL Cholesterol: 66 mg/dL (ref 0–99)
Total CHOL/HDL Ratio: 3.1 RATIO
Triglycerides: 93 mg/dL (ref <150)
VLDL: 19 mg/dL (ref 0–40)

## 2021-04-08 LAB — HIV ANTIBODY (ROUTINE TESTING W REFLEX): HIV Screen 4th Generation wRfx: NONREACTIVE

## 2021-04-08 IMAGING — MR MR HEAD W/ CM
2 of 3 series · 22 of 48 positions shown · IV contrast (7ML Gadavist)
Comparison: Earlier same day

CLINICAL DATA: Multiple masses on noncontrast MRI

EXAM:
MRI HEAD WITH CONTRAST
TECHNIQUE: Multiplanar, multiecho pulse sequences of the brain and surrounding
structures were obtained with intravenous contrast.
CONTRAST:  7mL GADAVIST GADOBUTROL 1 MMOL/ML IV SOLN

[Series 6: T1 post-contrast · coronal · 5.0mm · 0.34mm/px · 13 of 29 slices shown (1 of 2)]
[im 1/29]
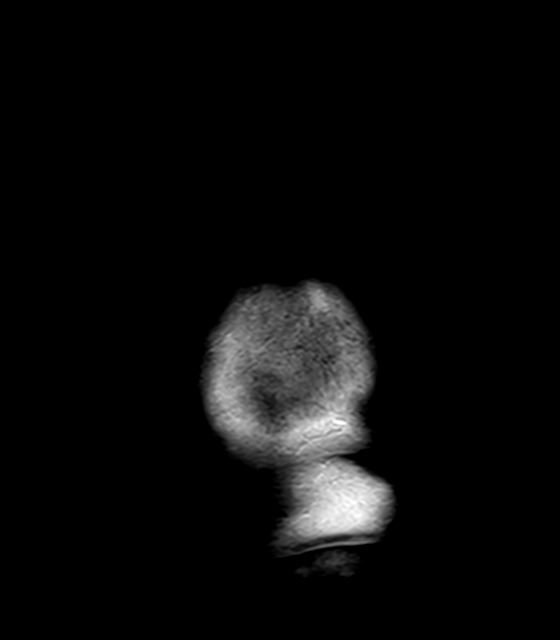
[im 3/29]
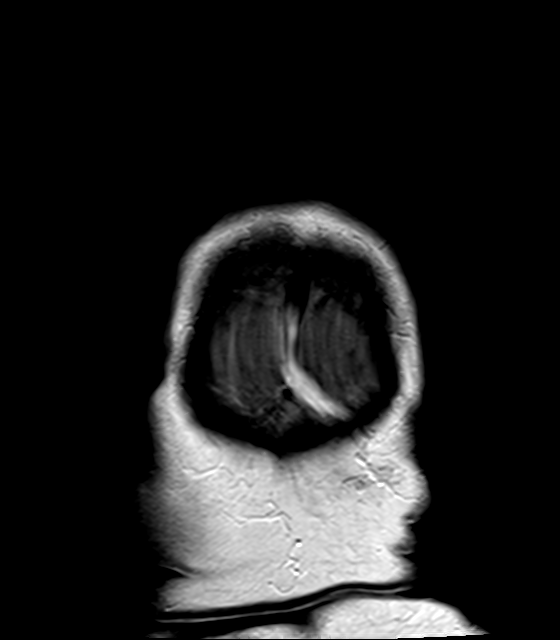
[im 5/29]
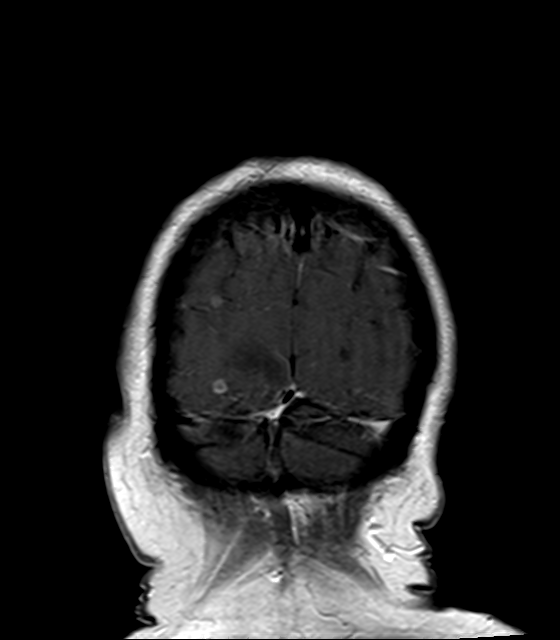
[im 8/29]
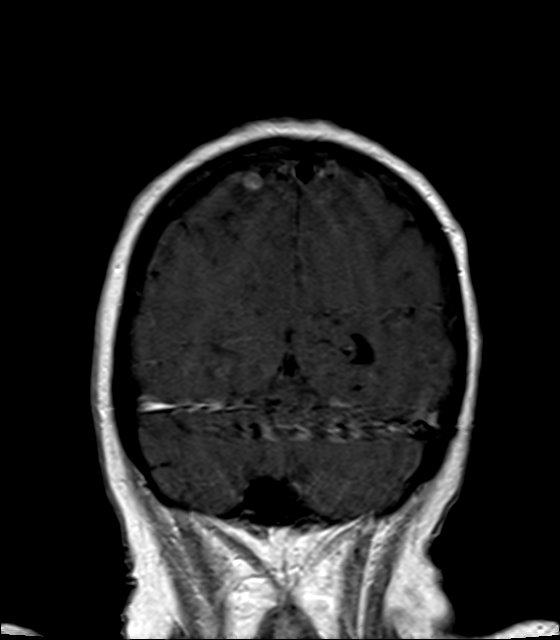
[im 10/29]
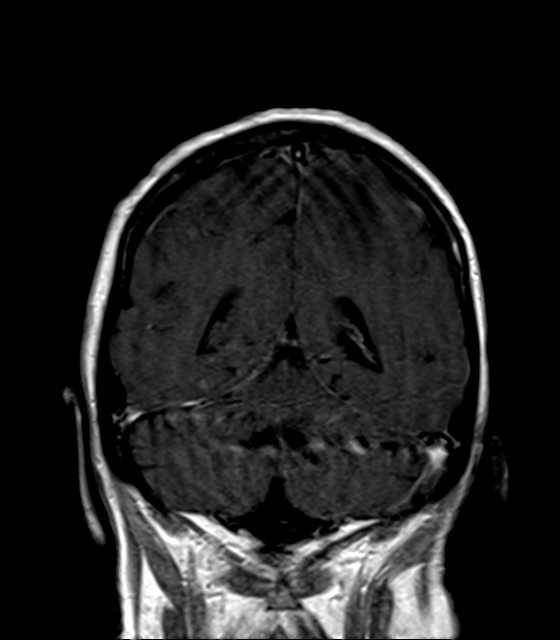
[im 12/29]
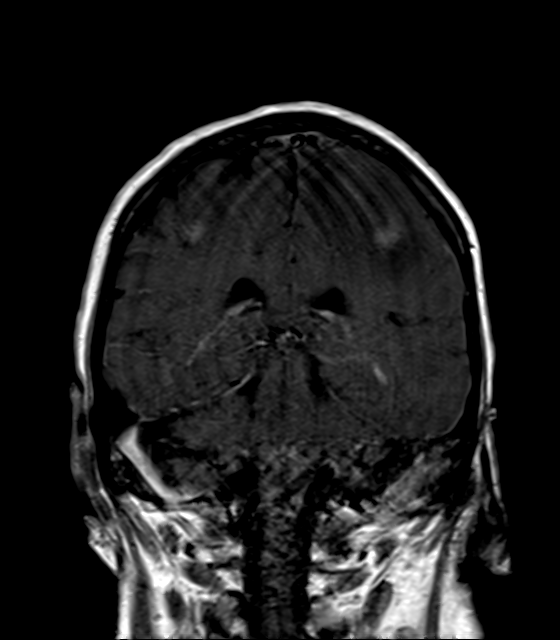
[im 15/29]
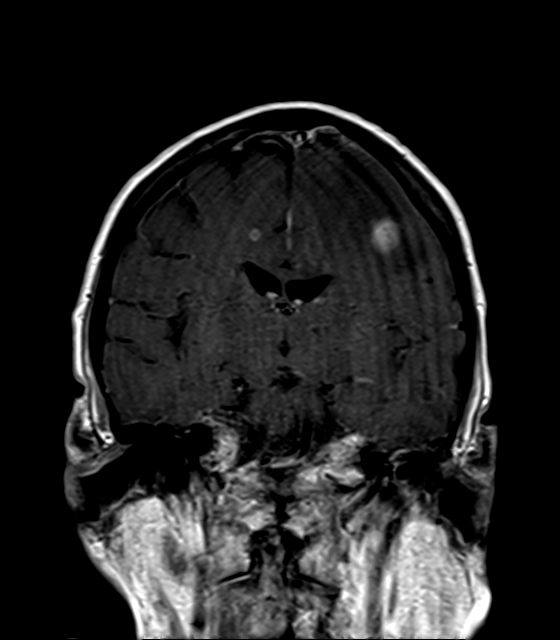
[im 17/29]
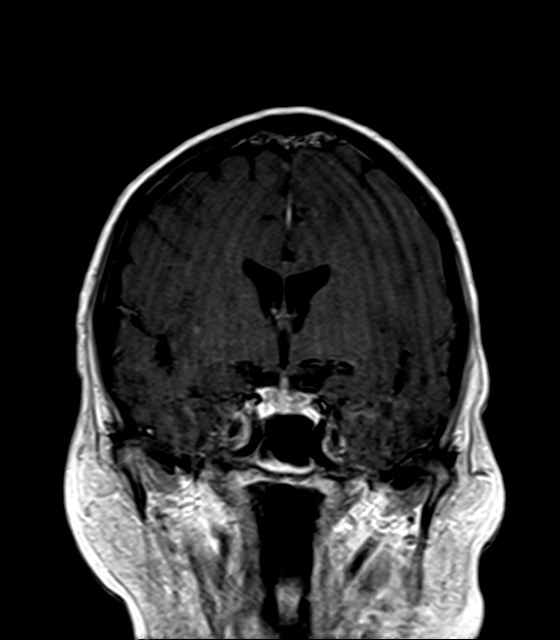
[im 19/29]
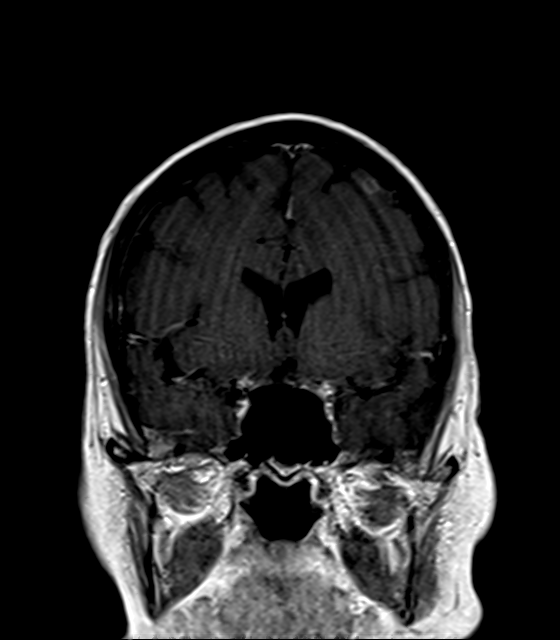
[im 22/29]
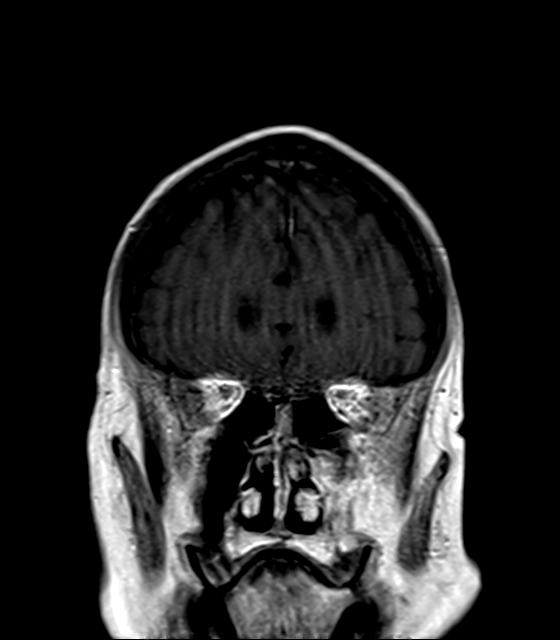
[im 24/29]
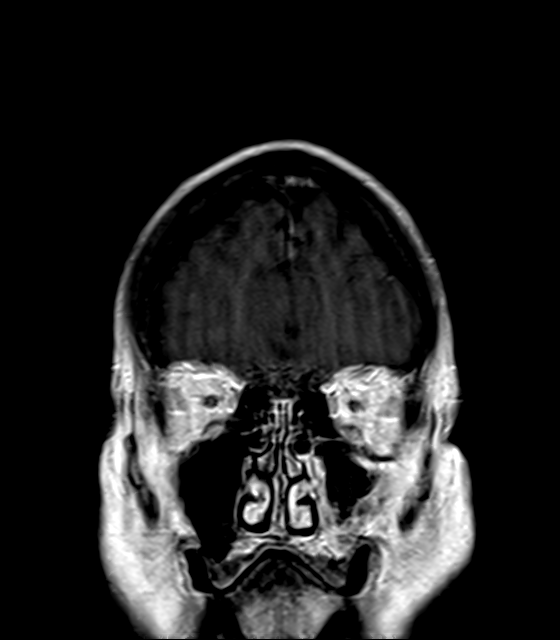
[im 26/29]
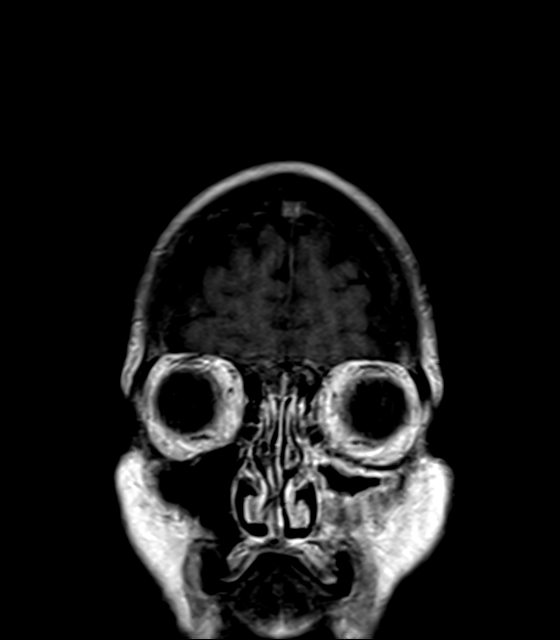
[im 29/29]
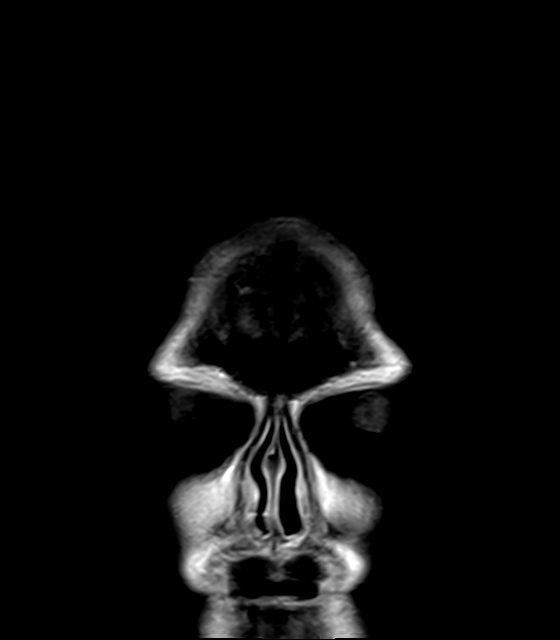

[Series 7: T1 post-contrast · sagittal · 5.0mm · 0.80mm/px · 9 of 21 slices shown (2 of 2)]
[im 1/21]
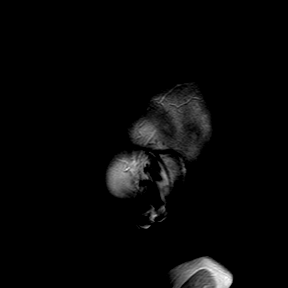
[im 3/21]
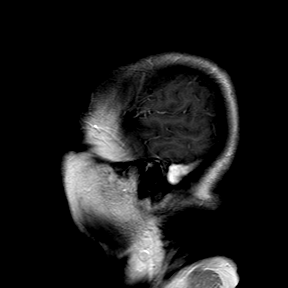
[im 6/21]
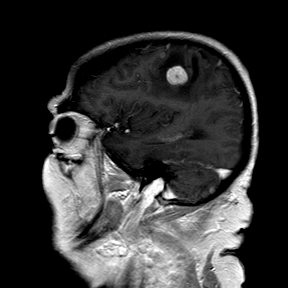
[im 8/21]
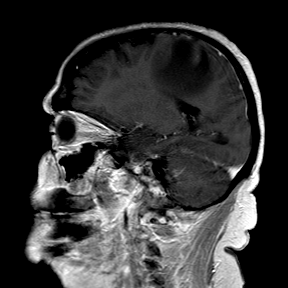
[im 11/21]
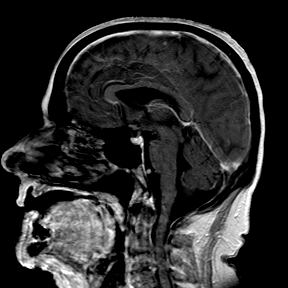
[im 13/21]
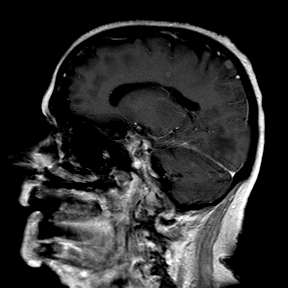
[im 16/21]
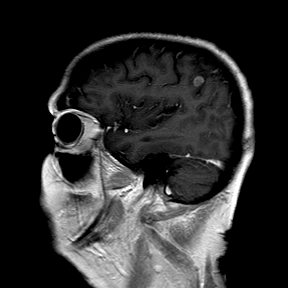
[im 18/21]
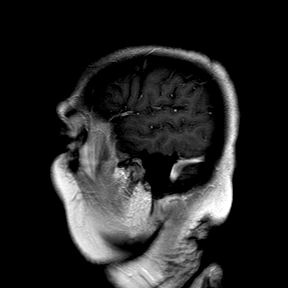
[im 21/21]
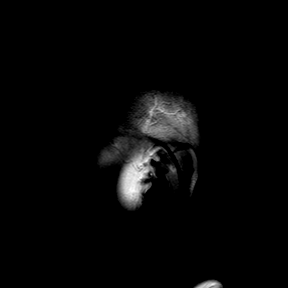

[22 of 48 positions shown; findings below may reference images not displayed]

FINDINGS: Approximately 16 enhancing lesions are identified. The largest in
the posterior left frontal lobe measures 1.5 cm involving the
precentral gyrus. Small cerebellar lesions are present. Questionable

No new finding.
IMPRESSION: Approximately 16 enhancing metastases.

## 2021-04-08 IMAGING — MR MR MRA HEAD W/O CM
1 series · 48 of 48 positions shown · non-contrast
Comparison: CT head [DATE]

CLINICAL DATA: Acute neuro deficit. Right arm weakness. Lung mass
on chest x-ray

EXAM:
MRI HEAD WITHOUT CONTRAST
MRA HEAD WITHOUT CONTRAST
TECHNIQUE: Multiplanar, multiecho pulse sequences of the brain and surrounding
structures were obtained without intravenous contrast. Angiographic
images of the head were obtained using MRA technique without
contrast.

[Series 1: TOF · axial · 0.5mm · 0.76mm/px · z∈[-15,+63]mm · 48 of 168 slices shown]
[im 1/168]
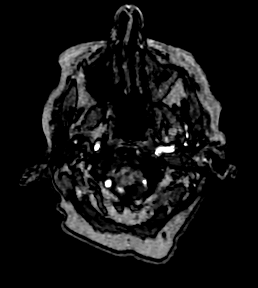
[im 4/168]
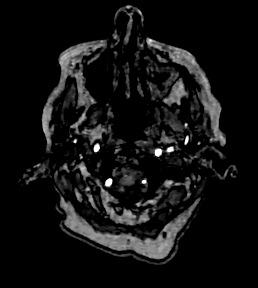
[im 8/168]
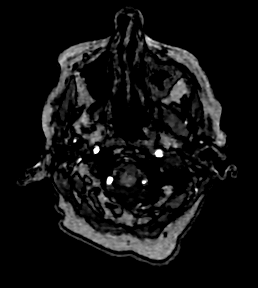
[im 11/168]
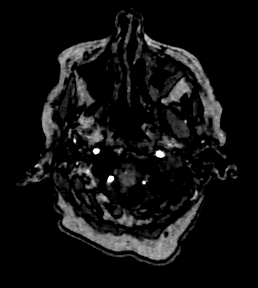
[im 15/168]
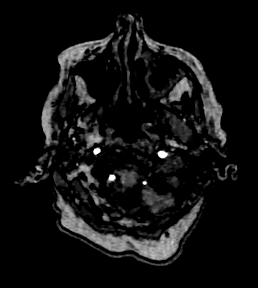
[im 18/168]
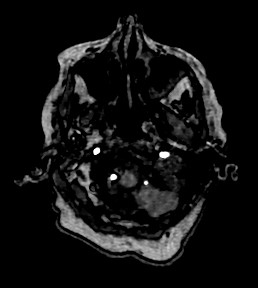
[im 22/168]
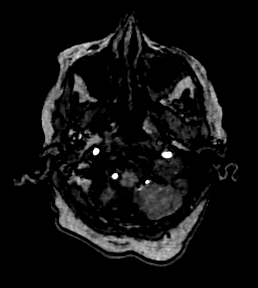
[im 25/168]
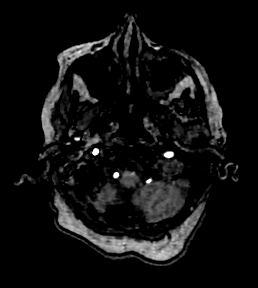
[im 29/168]
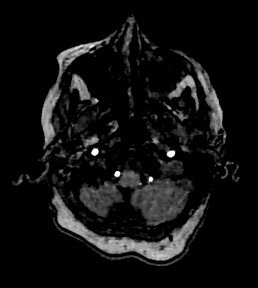
[im 32/168]
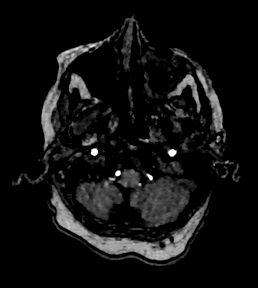
[im 36/168]
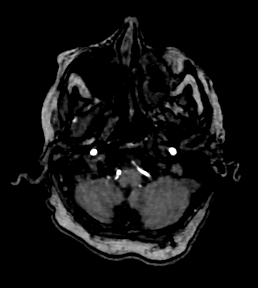
[im 40/168]
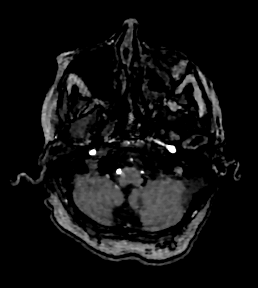
[im 43/168]
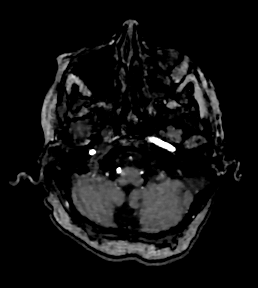
[im 47/168]
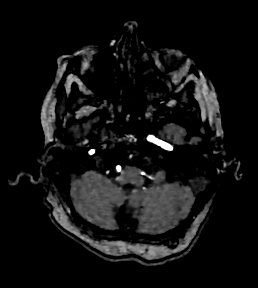
[im 50/168]
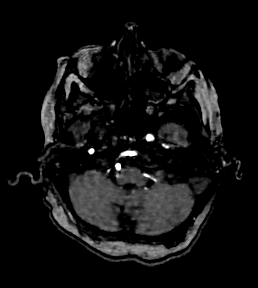
[im 54/168]
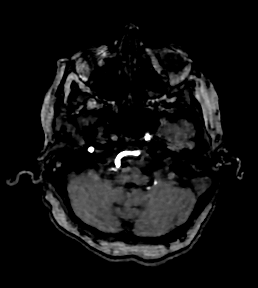
[im 57/168]
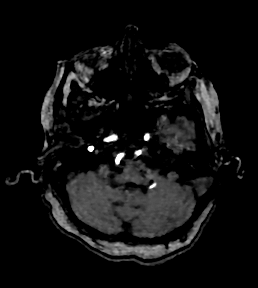
[im 61/168]
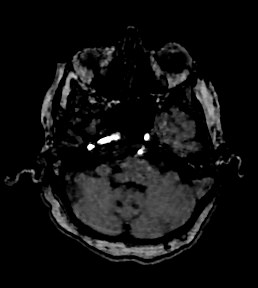
[im 64/168]
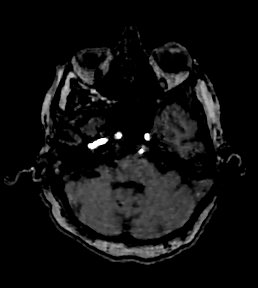
[im 68/168]
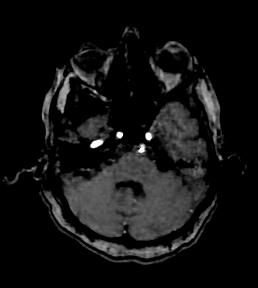
[im 72/168]
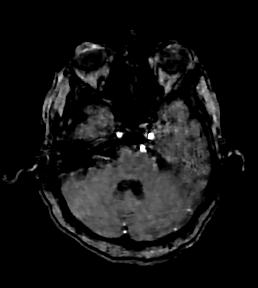
[im 75/168]
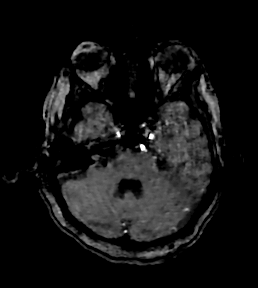
[im 79/168]
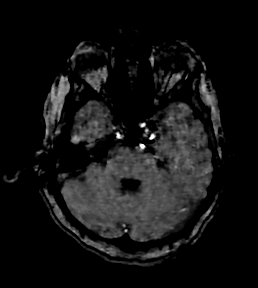
[im 82/168]
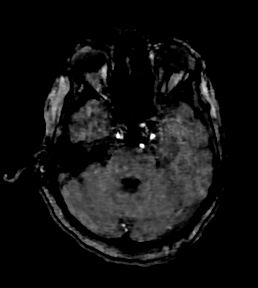
[im 86/168]
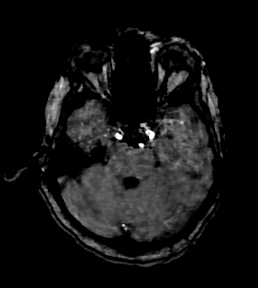
[im 89/168]
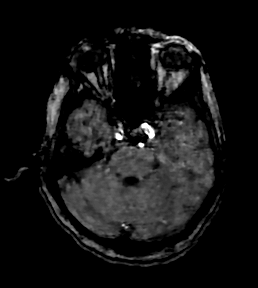
[im 93/168]
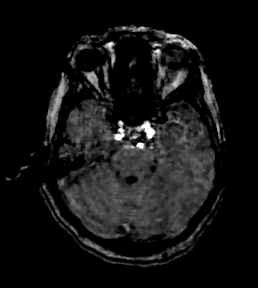
[im 96/168]
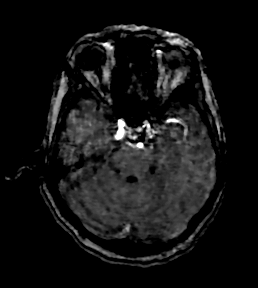
[im 100/168]
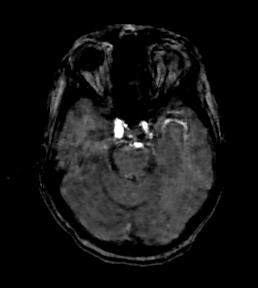
[im 104/168]
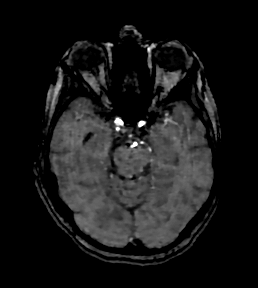
[im 107/168]
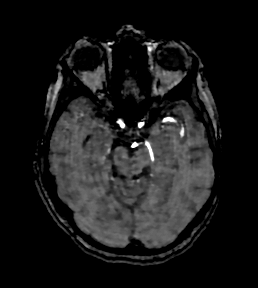
[im 111/168]
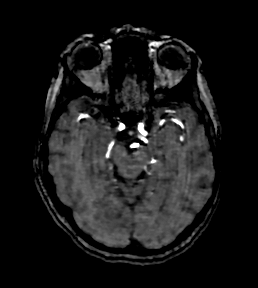
[im 114/168]
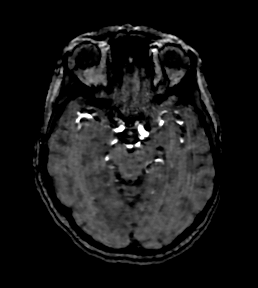
[im 118/168]
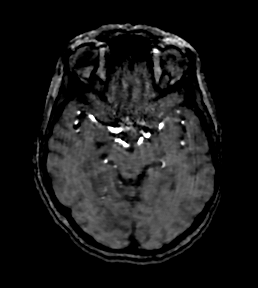
[im 121/168]
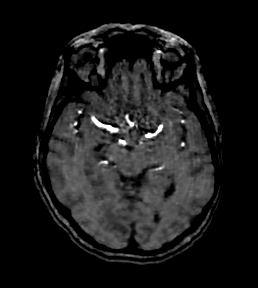
[im 125/168]
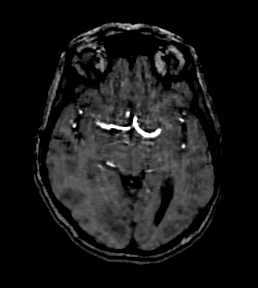
[im 128/168]
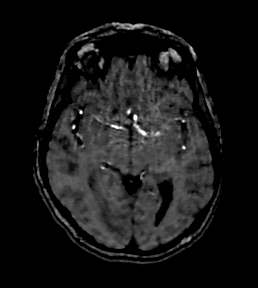
[im 132/168]
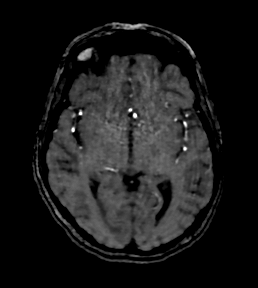
[im 136/168]
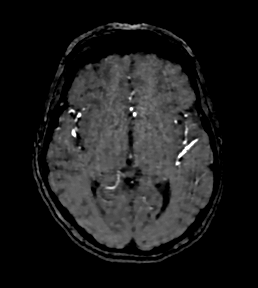
[im 139/168]
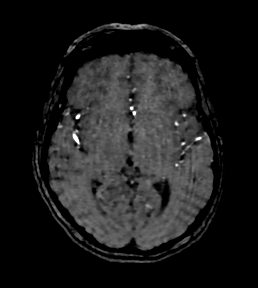
[im 143/168]
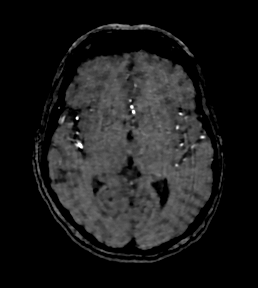
[im 146/168]
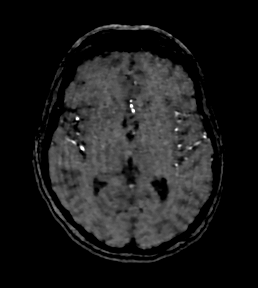
[im 150/168]
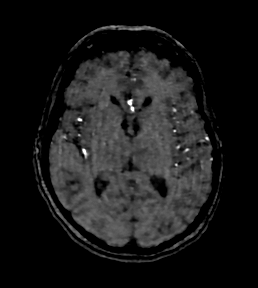
[im 153/168]
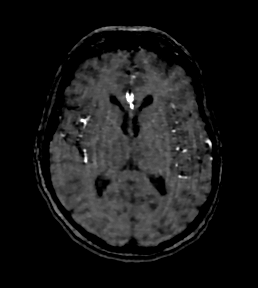
[im 157/168]
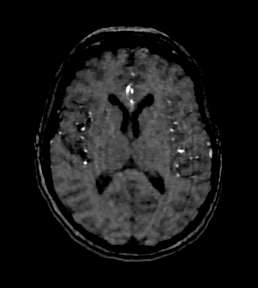
[im 160/168]
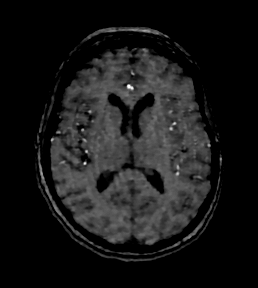
[im 164/168]
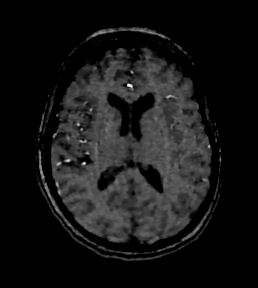
[im 168/168]
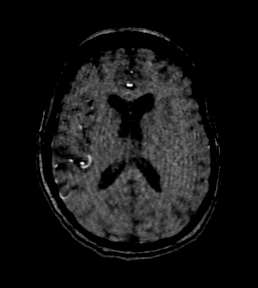

[48 of 48 positions shown; findings below may reference images not displayed]

FINDINGS: MRI HEAD FINDINGS

Brain: Negative for acute infarct.

Moderately large area of vasogenic edema in the left frontal
parietal lobe as noted on CT. There is an associated 15 mm mass in
the left precentral gyrus.

Second area vasogenic edema is smaller in the right parietal lobe
also with a associated small mass lesion. Mass is best seen on FLAIR
and diffusion weighted imaging.

Additional small areas of edema in the right frontal lobe and right
pericallosal white matter also suspicious for tumor.

Separate areas of vasogenic edema in the right parietal lobe and
right occipital lobe with associated small mass lesions. Small areas
of FLAIR hyperintensity in the cerebellum bilaterally suspicious for
metastatic disease.

No acute hemorrhage.  Ventricle size normal.  No midline shift.

Vascular: Normal arterial flow voids.

Skull and upper cervical spine: No suspicious skeletal lesions.

Sinuses/Orbits: Extensive mucosal edema and air-fluid level left
maxillary sinus. Negative orbit. Bilateral mastoid effusion

Other: None

MRA HEAD FINDINGS

Right vertebral artery dominant and widely patent. Left vertebral
artery is hypoplastic distally but does contribute to the basilar.
PICA patent bilaterally. Atherosclerotic irregularity in the basilar
with moderate stenosis in the distal basilar. Posterior cerebral
arteries patent bilaterally without significant stenosis

Atherosclerotic irregularity and stenosis in the cavernous carotid
bilaterally. Anterior and middle cerebral arteries patent
bilaterally without stenosis or large vessel occlusion.

Negative for cerebral aneurysm.
IMPRESSION: 1. Negative for acute infarct
2. Multiple intracranial mass lesions with surrounding vasogenic
edema compatible with metastatic disease. Lung mass noted on chest
x-ray [DATE]. Recommend follow-up MRI brain with contrast to
evaluate metastatic disease
3. Intracranial atherosclerotic disease especially the basilar and
posterior cerebral arteries bilaterally. Negative for large vessel
occlusion.

## 2021-04-08 IMAGING — MR MR HEAD W/O CM
8 of 9 series · 39 of 48 positions shown · non-contrast
Comparison: CT head [DATE]

CLINICAL DATA: Acute neuro deficit. Right arm weakness. Lung mass
on chest x-ray

EXAM:
MRI HEAD WITHOUT CONTRAST
MRA HEAD WITHOUT CONTRAST
TECHNIQUE: Multiplanar, multiecho pulse sequences of the brain and surrounding
structures were obtained without intravenous contrast. Angiographic
images of the head were obtained using MRA technique without
contrast.

[Series 5: DWI · axial · 4.0mm · 0.88mm/px · z∈[-14,+125]mm · 6 of 36 slices shown (1 of 4)]
[im 1/36]
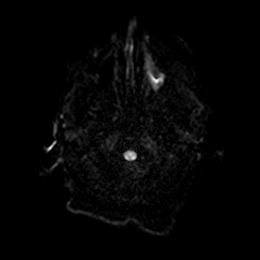
[im 8/36]
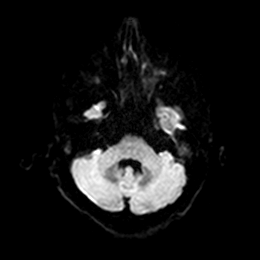
[im 15/36]
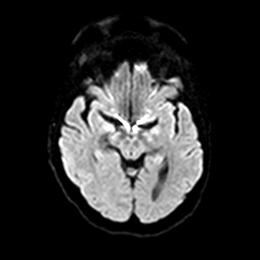
[im 22/36]
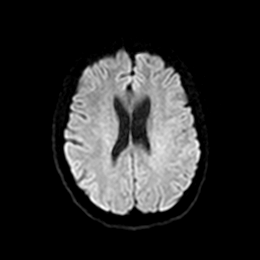
[im 29/36]
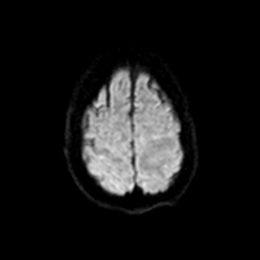
[im 36/36]
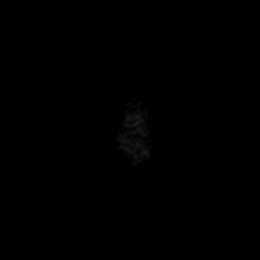

[Series 6: DWI · axial · 4.0mm · 0.88mm/px · z∈[-14,+125]mm · 6 of 36 slices shown (2 of 4)]
[im 1/36]
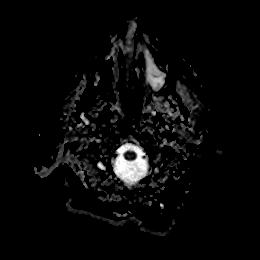
[im 8/36]
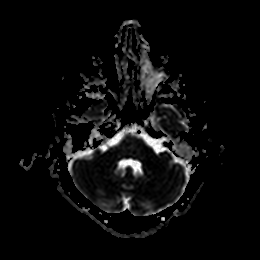
[im 15/36]
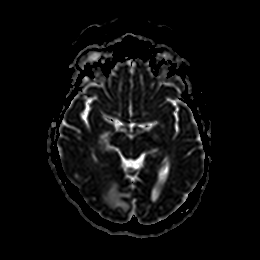
[im 22/36]
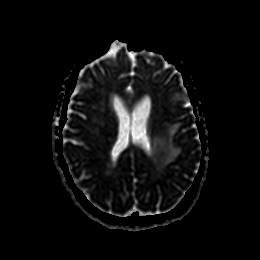
[im 29/36]
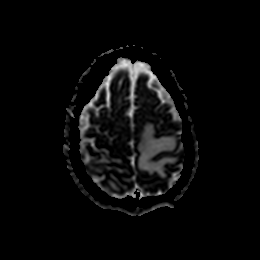
[im 36/36]
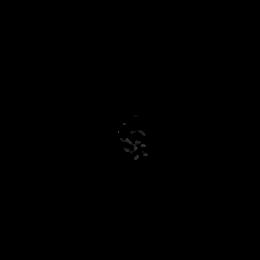

[Series 7: DWI · coronal · 4.0mm · 0.88mm/px · 5 of 32 slices shown (3 of 4)]
[im 1/32]
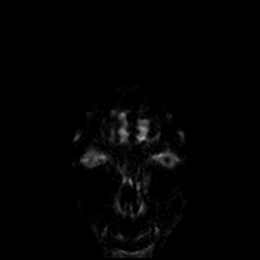
[im 8/32]
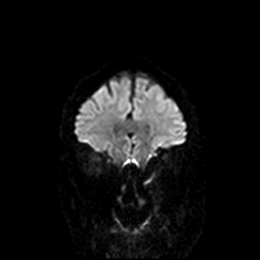
[im 16/32]
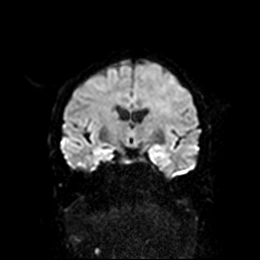
[im 24/32]
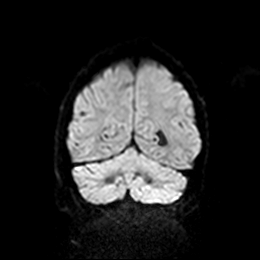
[im 32/32]
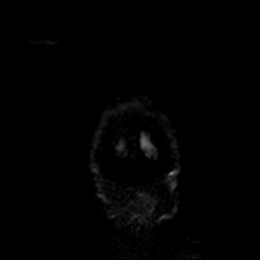

[Series 8: DWI · coronal · 4.0mm · 0.88mm/px · 5 of 32 slices shown (4 of 4)]
[im 1/32]
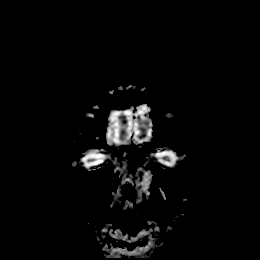
[im 8/32]
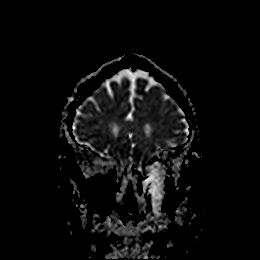
[im 16/32]
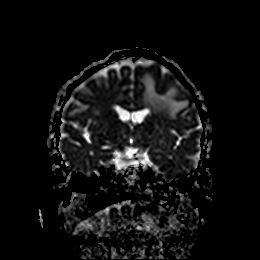
[im 24/32]
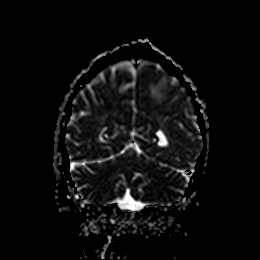
[im 32/32]
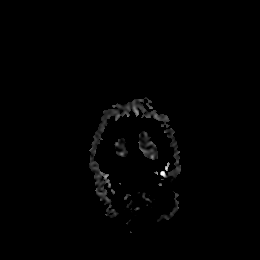

[Series 9: T1 · sagittal · 5.0mm · 0.80mm/px · 3 of 21 slices shown]
[im 1/21]
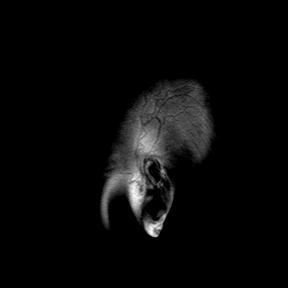
[im 11/21]
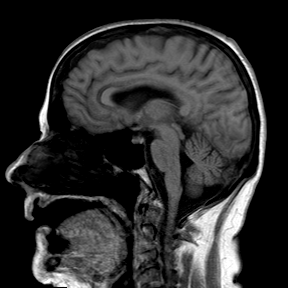
[im 21/21]
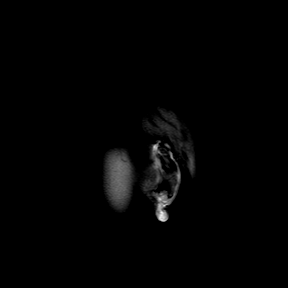

[Series 10: T2 · axial · 5.0mm · 0.72mm/px · z∈[-20,+133]mm · 4 of 23 slices shown]
[im 1/23]
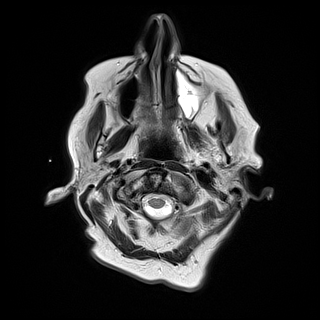
[im 8/23]
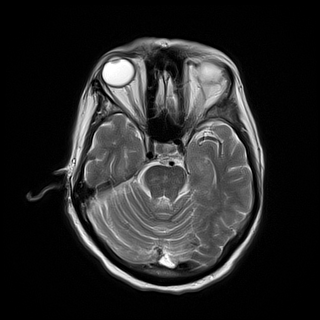
[im 15/23]
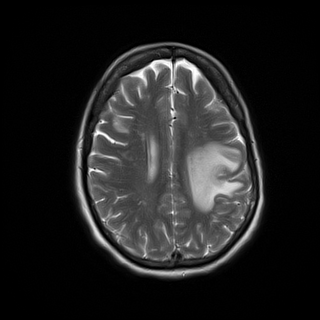
[im 23/23]
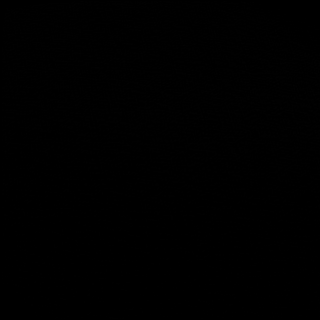

[Series 11: ax hemo · axial · 5.0mm · 0.86mm/px · z∈[-17,+127]mm · 4 of 25 slices shown]
[im 1/25]
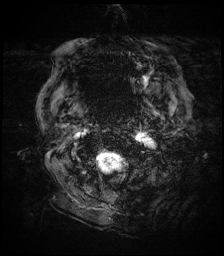
[im 9/25]
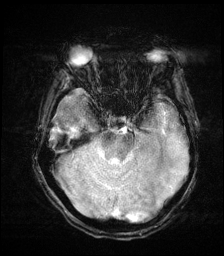
[im 17/25]
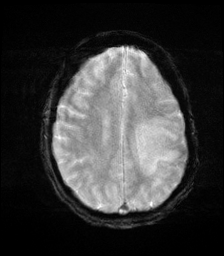
[im 25/25]
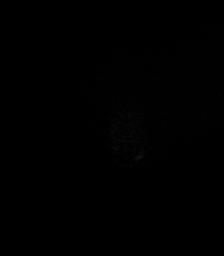

[Series 12: FLAIR · axial · 4.0mm · 0.43mm/px · z∈[-19,+129]mm · 6 of 38 slices shown]
[im 1/38]
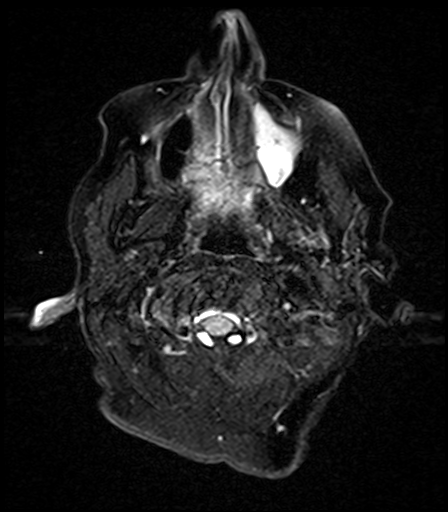
[im 8/38]
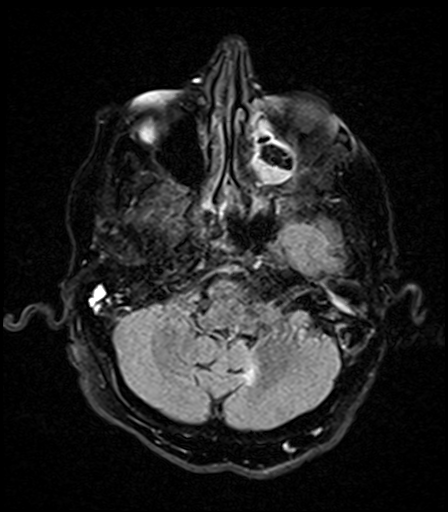
[im 15/38]
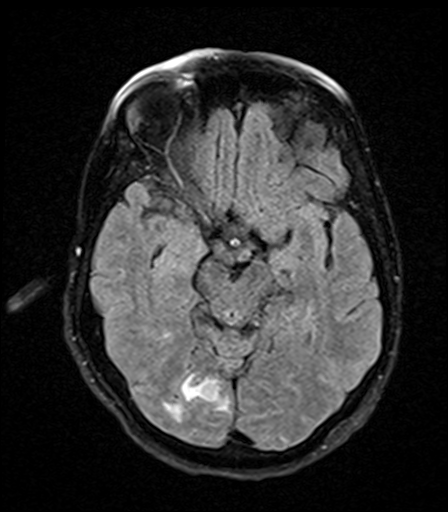
[im 23/38]
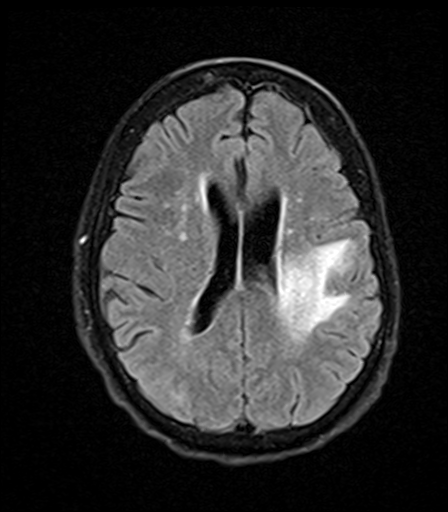
[im 30/38]
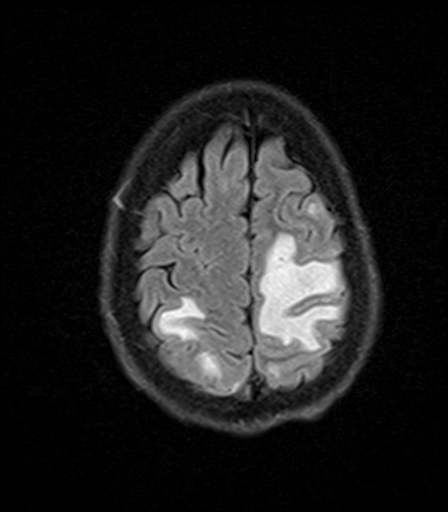
[im 38/38]
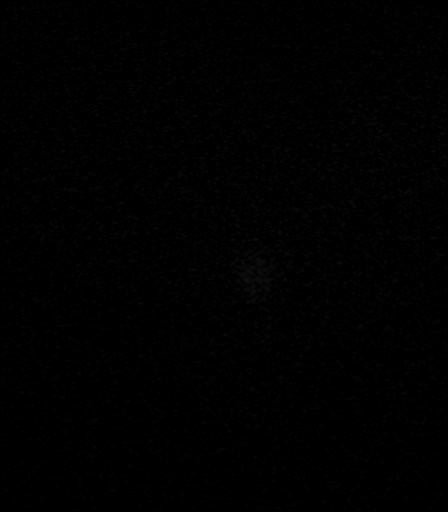

[39 of 48 positions shown; findings below may reference images not displayed]

FINDINGS: MRI HEAD FINDINGS

Brain: Negative for acute infarct.

Moderately large area of vasogenic edema in the left frontal
parietal lobe as noted on CT. There is an associated 15 mm mass in
the left precentral gyrus.

Second area vasogenic edema is smaller in the right parietal lobe
also with a associated small mass lesion. Mass is best seen on FLAIR
and diffusion weighted imaging.

Additional small areas of edema in the right frontal lobe and right
pericallosal white matter also suspicious for tumor.

Separate areas of vasogenic edema in the right parietal lobe and
right occipital lobe with associated small mass lesions. Small areas
of FLAIR hyperintensity in the cerebellum bilaterally suspicious for
metastatic disease.

No acute hemorrhage.  Ventricle size normal.  No midline shift.

Vascular: Normal arterial flow voids.

Skull and upper cervical spine: No suspicious skeletal lesions.

Sinuses/Orbits: Extensive mucosal edema and air-fluid level left
maxillary sinus. Negative orbit. Bilateral mastoid effusion

Other: None

MRA HEAD FINDINGS

Right vertebral artery dominant and widely patent. Left vertebral
artery is hypoplastic distally but does contribute to the basilar.
PICA patent bilaterally. Atherosclerotic irregularity in the basilar
with moderate stenosis in the distal basilar. Posterior cerebral
arteries patent bilaterally without significant stenosis

Atherosclerotic irregularity and stenosis in the cavernous carotid
bilaterally. Anterior and middle cerebral arteries patent
bilaterally without stenosis or large vessel occlusion.

Negative for cerebral aneurysm.
IMPRESSION: 1. Negative for acute infarct
2. Multiple intracranial mass lesions with surrounding vasogenic
edema compatible with metastatic disease. Lung mass noted on chest
x-ray [DATE]. Recommend follow-up MRI brain with contrast to
evaluate metastatic disease
3. Intracranial atherosclerotic disease especially the basilar and
posterior cerebral arteries bilaterally. Negative for large vessel
occlusion.

## 2021-04-08 IMAGING — CT CT CHEST-ABD-PELV W/ CM
2 of 5 series · 12 of 36 positions shown, 14 images · IV contrast (Omnipaque or Isovue)
Comparison: Chest radiograph [DATE] and CT chest from
[DATE]

CLINICAL DATA: Right upper lobe lung mass on chest radiography,
with intracranial metastatic disease. Staging workup.

EXAM:
CT CHEST, ABDOMEN, AND PELVIS WITH CONTRAST
TECHNIQUE: Multidetector CT imaging of the chest, abdomen and pelvis was
performed following the standard protocol during bolus
administration of intravenous contrast.
CONTRAST:  100mL OMNIPAQUE IOHEXOL 300 MG/ML  SOLN

[Series 2: cap with · axial · 0.88mm/px · z∈[-665,-115]mm · 9 of 138 slices shown, 11 images]
[im 14/138  mediastinal]
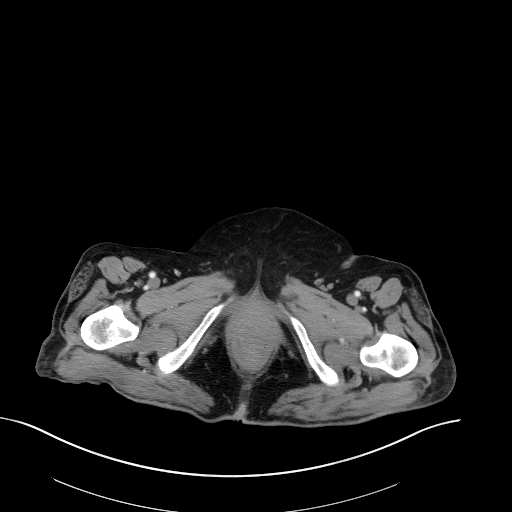
[im 14/138  bone]
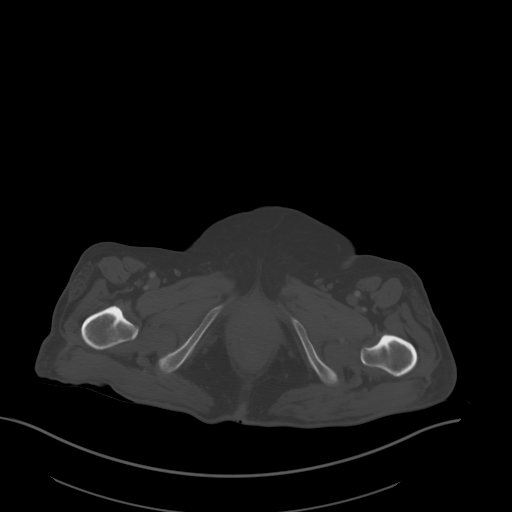
[im 28/138  mediastinal]
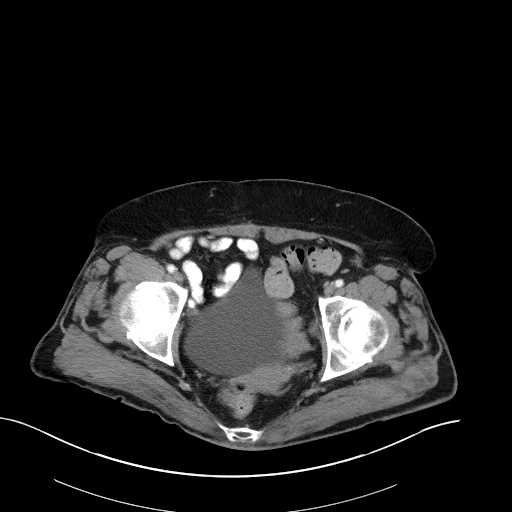
[im 42/138  mediastinal]
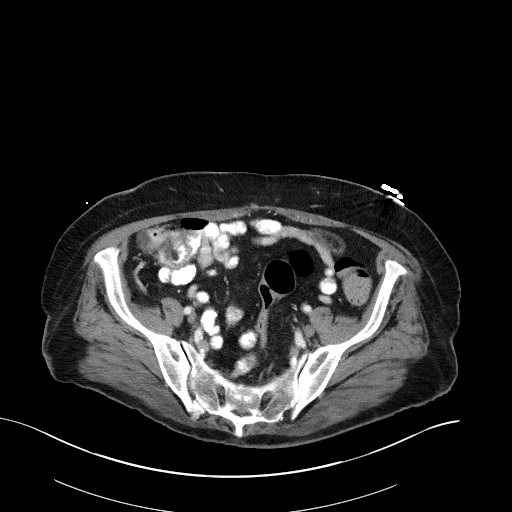
[im 55/138  mediastinal]
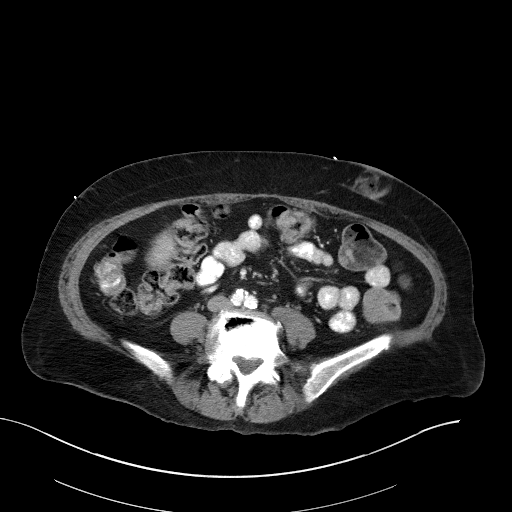
[im 69/138  mediastinal]
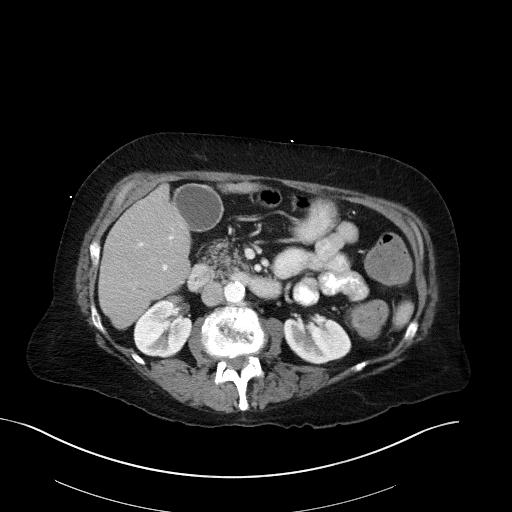
[im 83/138  mediastinal]
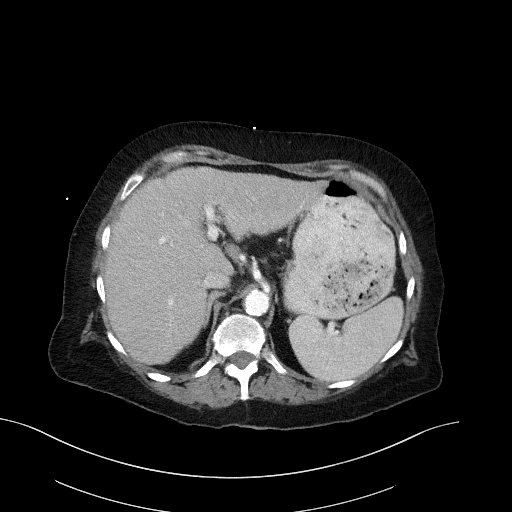
[im 96/138  mediastinal]
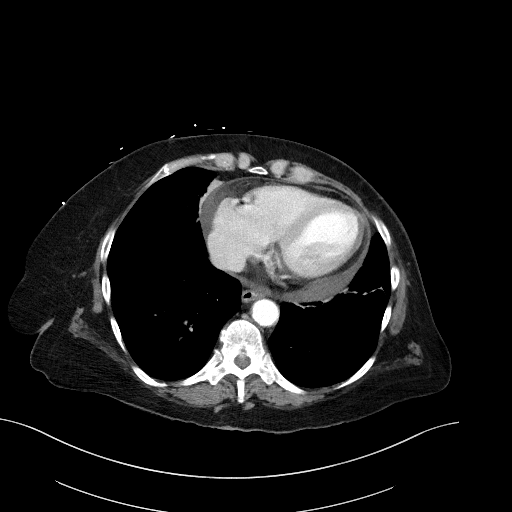
[im 110/138  mediastinal]
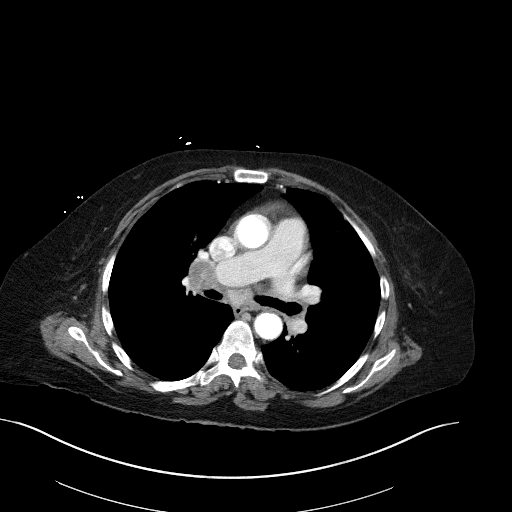
[im 124/138  mediastinal]
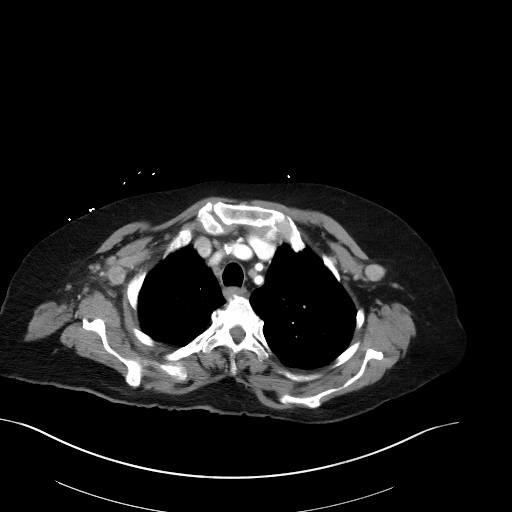
[im 124/138  bone]
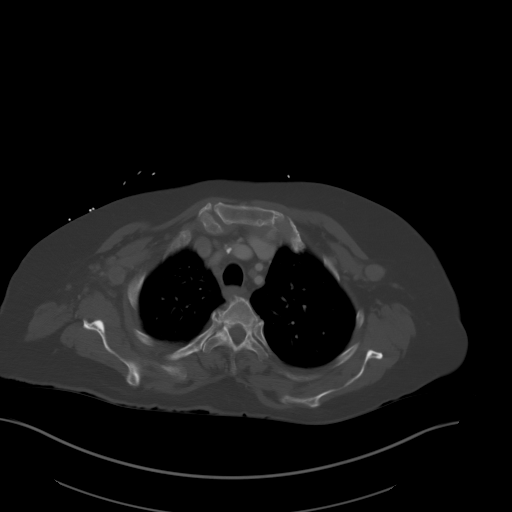

[Series 5: coronals · coronal · 0.77mm/px · 3 of 137 slices shown]
[im 28/137  mediastinal]
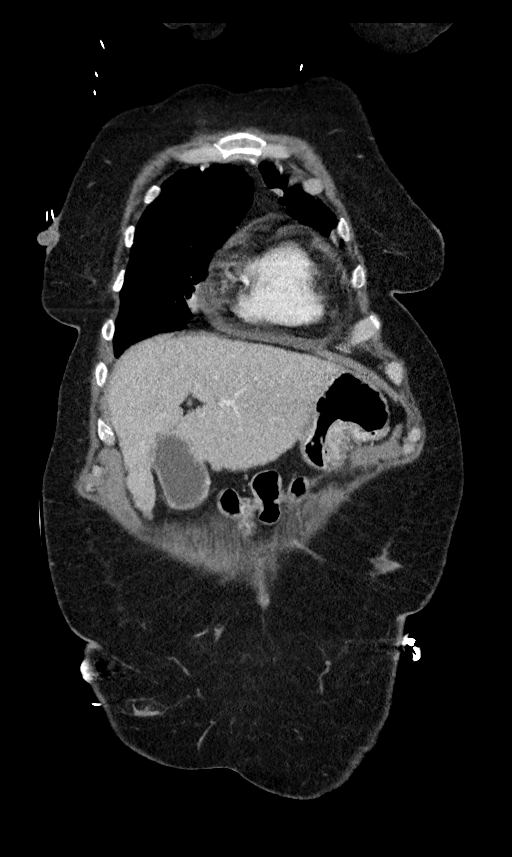
[im 55/137  mediastinal]
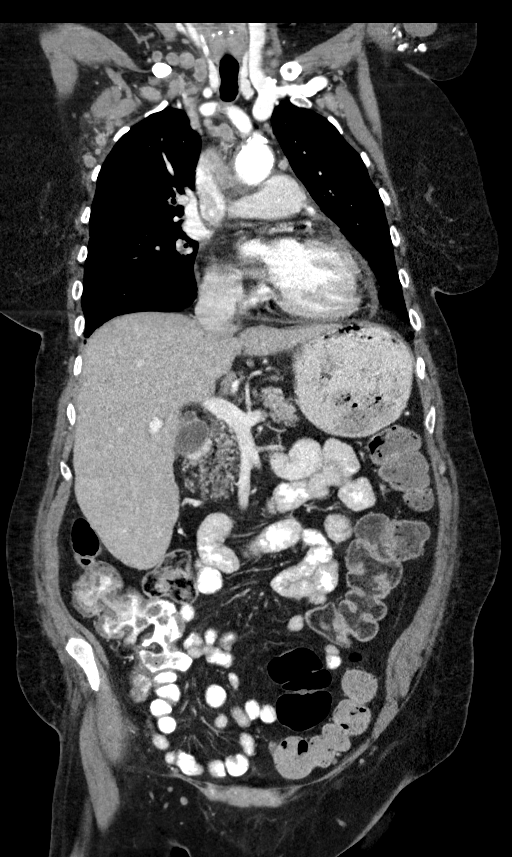
[im 82/137  mediastinal]
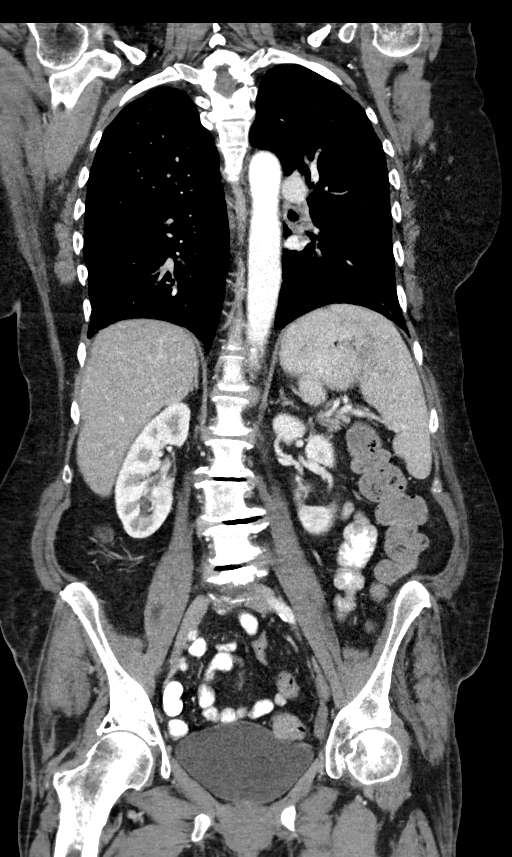

[12 of 36 positions shown; findings below may reference images not displayed]

FINDINGS: CT CHEST FINDINGS

Cardiovascular: Coronary, aortic arch, and branch vessel
atherosclerotic vascular disease. Moderate pericardial effusion.

Mediastinum/Nodes: There is bilateral supraclavicular, bilateral
hilar, bilateral subpectoral, prevascular, paratracheal, AP window,
right hilar, and subcarinal pathologic adenopathy. Index right
paratracheal node 2.6 cm in short axis, image 25 series 2. Index
right hilar node 2.5 cm in short axis, image 29 series 2.

There is also lower neck adenopathy in the level IV stations
bilaterally, including a 1.0 cm right level IV lymph node on image 7
series 2.

Lungs/Pleura: 3.7 by 3.7 cm right upper lobe mass, image 59 series
4. Part of this abuts the major fissure.

Mild atelectasis medially in the right middle lobe.

Airway thickening with airway plugging in the right lower lobe.

0.7 by 0.9 cm left upper lobe nodule medially on image 65 of series
4.

Bandlike density in the lingula. Bandlike density with some
nodularity measuring up to 1.0 cm in thickness in the left lower
lobe (image 91, series 4).

Mild airway thickening in the left lower lobe. 0.5 cm nodule in the
superior segment left lower lobe, image 64 series 4.

Musculoskeletal: Unremarkable

CT ABDOMEN PELVIS FINDINGS

Hepatobiliary: Nodular liver contour suspicious for cirrhosis. No
metastatic lesion is identified in the liver. Upper normal size of
the gallbladder without overt gallbladder wall thickening. No
biliary dilatation.

Pancreas: Unremarkable

Spleen: Unremarkable

Adrenals/Urinary Tract: Both adrenal glands appear normal. The
kidneys and urinary bladder appear normal.

Stomach/Bowel: Unremarkable

Vascular/Lymphatic: Aortoiliac atherosclerotic vascular disease.
Mildly enlarged 1.1 cm in short axis porta hepatis lymph node on
image 57 series 2, possibly reactive given the underlying cirrhosis.
Portacaval node 1.2 cm in short axis, image 60 series 2.

Reproductive: Unremarkable

Other: Small collections of fluid and gas along the anterior
abdominal wall are probably injection related.

Musculoskeletal: Lumbar spondylosis and degenerative disc disease
causing bilateral foraminal impingement at L4-5 and L5-S1 and left
foraminal impingement at L3-4.
IMPRESSION: 1. 3.7 cm right upper lobe lung mass, with extensive thoracic and
some lower neck adenopathy. In the context of the patient's
intracranial metastatic deposits, this likely represents metastatic
right upper lobe bronchogenic carcinoma.
2. No compelling findings of metastatic disease to the abdomen; the
mild degree of adenopathy along the porta hepatis is probably
reactive to the patient's underlying cirrhosis.
3. Moderate pericardial effusion.
4. Airway thickening in the lower lobes with some mild airway
plugging in the right lower lobe.
5. Two small left-sided pulmonary nodules are indeterminate but
merit surveillance.
6. Other imaging findings of potential clinical significance: Aortic
Atherosclerosis ([X3]-[X3]). Coronary atherosclerosis. Multilevel
lumbar impingement

## 2021-04-08 MED ORDER — ASPIRIN 325 MG PO TABS
325.0000 mg | ORAL_TABLET | Freq: Every day | ORAL | Status: DC
  Administered 2021-04-08: 325 mg via ORAL
  Filled 2021-04-08: qty 1

## 2021-04-08 MED ORDER — LORAZEPAM 2 MG/ML IJ SOLN
1.0000 mg | Freq: Once | INTRAMUSCULAR | Status: AC
  Administered 2021-04-08: 1 mg via INTRAVENOUS
  Filled 2021-04-08: qty 1

## 2021-04-08 MED ORDER — METOPROLOL TARTRATE 50 MG PO TABS
100.0000 mg | ORAL_TABLET | Freq: Two times a day (BID) | ORAL | Status: DC
  Administered 2021-04-08: 100 mg via ORAL
  Filled 2021-04-08: qty 2

## 2021-04-08 MED ORDER — ACETAMINOPHEN 325 MG PO TABS
650.0000 mg | ORAL_TABLET | ORAL | Status: DC | PRN
  Filled 2021-04-08: qty 2

## 2021-04-08 MED ORDER — HEPARIN SODIUM (PORCINE) 5000 UNIT/ML IJ SOLN
5000.0000 [IU] | Freq: Three times a day (TID) | INTRAMUSCULAR | Status: DC
  Administered 2021-04-08 – 2021-04-14 (×18): 5000 [IU] via SUBCUTANEOUS
  Filled 2021-04-08 (×18): qty 1

## 2021-04-08 MED ORDER — CHLORTHALIDONE 25 MG PO TABS
25.0000 mg | ORAL_TABLET | Freq: Every day | ORAL | Status: DC
  Administered 2021-04-08 – 2021-04-14 (×6): 25 mg via ORAL
  Filled 2021-04-08 (×7): qty 1

## 2021-04-08 MED ORDER — UMECLIDINIUM BROMIDE 62.5 MCG/INH IN AEPB
1.0000 | INHALATION_SPRAY | Freq: Every day | RESPIRATORY_TRACT | Status: DC
  Administered 2021-04-08 – 2021-04-14 (×5): 1 via RESPIRATORY_TRACT
  Filled 2021-04-08 (×2): qty 7

## 2021-04-08 MED ORDER — ATORVASTATIN CALCIUM 40 MG PO TABS
80.0000 mg | ORAL_TABLET | Freq: Every day | ORAL | Status: DC
  Administered 2021-04-08 – 2021-04-10 (×3): 80 mg via ORAL
  Filled 2021-04-08 (×3): qty 2

## 2021-04-08 MED ORDER — GADOBUTROL 1 MMOL/ML IV SOLN
7.0000 mL | Freq: Once | INTRAVENOUS | Status: AC | PRN
  Administered 2021-04-08: 7 mL via INTRAVENOUS

## 2021-04-08 MED ORDER — SODIUM CHLORIDE 0.9 % IV SOLN: INTRAVENOUS | Status: DC

## 2021-04-08 MED ORDER — IOHEXOL 300 MG/ML  SOLN
100.0000 mL | Freq: Once | INTRAMUSCULAR | Status: AC | PRN
  Administered 2021-04-08: 100 mL via INTRAVENOUS

## 2021-04-08 MED ORDER — OXYCODONE HCL 5 MG PO TABS
5.0000 mg | ORAL_TABLET | Freq: Four times a day (QID) | ORAL | Status: DC | PRN
  Administered 2021-04-08: 5 mg via ORAL
  Filled 2021-04-08 (×2): qty 1

## 2021-04-08 MED ORDER — STROKE: EARLY STAGES OF RECOVERY BOOK: Freq: Once | Status: AC

## 2021-04-08 MED ORDER — CYCLOBENZAPRINE HCL 5 MG PO TABS
5.0000 mg | ORAL_TABLET | Freq: Three times a day (TID) | ORAL | Status: DC | PRN
  Administered 2021-04-08 – 2021-04-11 (×4): 5 mg via ORAL
  Filled 2021-04-08 (×5): qty 1

## 2021-04-08 MED ORDER — TIOTROPIUM BROMIDE MONOHYDRATE 18 MCG IN CAPS: 18.0000 ug | ORAL_CAPSULE | Freq: Every day | RESPIRATORY_TRACT | Status: DC

## 2021-04-08 MED ORDER — ACETAMINOPHEN 650 MG RE SUPP: 650.0000 mg | RECTAL | Status: DC | PRN

## 2021-04-08 MED ORDER — ACETAMINOPHEN 160 MG/5ML PO SOLN: 650.0000 mg | ORAL | Status: DC | PRN

## 2021-04-08 MED ORDER — INSULIN ASPART 100 UNIT/ML ~~LOC~~ SOLN
0.0000 [IU] | Freq: Three times a day (TID) | SUBCUTANEOUS | Status: DC
  Administered 2021-04-08: 3 [IU] via SUBCUTANEOUS
  Administered 2021-04-08: 5 [IU] via SUBCUTANEOUS
  Administered 2021-04-09: 11 [IU] via SUBCUTANEOUS
  Administered 2021-04-09: 5 [IU] via SUBCUTANEOUS
  Administered 2021-04-09: 8 [IU] via SUBCUTANEOUS
  Administered 2021-04-10: 5 [IU] via SUBCUTANEOUS
  Administered 2021-04-10: 3 [IU] via SUBCUTANEOUS

## 2021-04-08 MED ORDER — DEXAMETHASONE 4 MG PO TABS
4.0000 mg | ORAL_TABLET | Freq: Four times a day (QID) | ORAL | Status: DC
  Administered 2021-04-08 – 2021-04-14 (×23): 4 mg via ORAL
  Filled 2021-04-08 (×23): qty 1

## 2021-04-08 MED ORDER — DILTIAZEM HCL 60 MG PO TABS
60.0000 mg | ORAL_TABLET | Freq: Two times a day (BID) | ORAL | Status: DC
  Administered 2021-04-08: 60 mg via ORAL
  Filled 2021-04-08: qty 1

## 2021-04-08 MED ORDER — OXYCODONE HCL 5 MG PO TABS
5.0000 mg | ORAL_TABLET | Freq: Four times a day (QID) | ORAL | Status: DC | PRN
  Administered 2021-04-08 – 2021-04-14 (×19): 5 mg via ORAL
  Filled 2021-04-08 (×20): qty 1

## 2021-04-08 MED ORDER — DEXAMETHASONE SODIUM PHOSPHATE 4 MG/ML IJ SOLN
10.0000 mg | Freq: Once | INTRAMUSCULAR | Status: AC
  Administered 2021-04-08: 10 mg via INTRAVENOUS
  Filled 2021-04-08: qty 3

## 2021-04-08 MED ORDER — ONDANSETRON HCL 4 MG/2ML IJ SOLN
4.0000 mg | Freq: Four times a day (QID) | INTRAMUSCULAR | Status: DC | PRN
  Administered 2021-04-12: 4 mg via INTRAVENOUS
  Filled 2021-04-08: qty 2

## 2021-04-08 MED ORDER — FAMOTIDINE 20 MG PO TABS
20.0000 mg | ORAL_TABLET | Freq: Every day | ORAL | Status: DC
  Administered 2021-04-08 – 2021-04-14 (×6): 20 mg via ORAL
  Filled 2021-04-08 (×6): qty 1

## 2021-04-08 MED ORDER — ALBUTEROL SULFATE HFA 108 (90 BASE) MCG/ACT IN AERS
1.0000 | INHALATION_SPRAY | RESPIRATORY_TRACT | Status: DC | PRN
  Administered 2021-04-10: 2 via RESPIRATORY_TRACT
  Filled 2021-04-08 (×2): qty 6.7

## 2021-04-08 MED ORDER — GABAPENTIN 400 MG PO CAPS
400.0000 mg | ORAL_CAPSULE | Freq: Three times a day (TID) | ORAL | Status: DC
  Administered 2021-04-08 – 2021-04-14 (×18): 400 mg via ORAL
  Filled 2021-04-08 (×18): qty 1

## 2021-04-08 MED ORDER — IOHEXOL 9 MG/ML PO SOLN
ORAL | Status: AC
  Filled 2021-04-08: qty 1000

## 2021-04-08 MED ORDER — INSULIN ASPART 100 UNIT/ML ~~LOC~~ SOLN
0.0000 [IU] | Freq: Every day | SUBCUTANEOUS | Status: DC
  Administered 2021-04-09: 2 [IU] via SUBCUTANEOUS

## 2021-04-08 MED ORDER — AMOXICILLIN-POT CLAVULANATE 875-125 MG PO TABS
1.0000 | ORAL_TABLET | Freq: Two times a day (BID) | ORAL | Status: DC
  Administered 2021-04-08 – 2021-04-14 (×13): 1 via ORAL
  Filled 2021-04-08 (×11): qty 1

## 2021-04-08 MED ORDER — POTASSIUM CHLORIDE 20 MEQ PO PACK
40.0000 meq | PACK | Freq: Once | ORAL | Status: AC
  Administered 2021-04-08: 40 meq via ORAL
  Filled 2021-04-08: qty 2

## 2021-04-08 MED ORDER — MOMETASONE FURO-FORMOTEROL FUM 200-5 MCG/ACT IN AERO
2.0000 | INHALATION_SPRAY | Freq: Two times a day (BID) | RESPIRATORY_TRACT | Status: DC
  Administered 2021-04-08 – 2021-04-14 (×12): 2 via RESPIRATORY_TRACT
  Filled 2021-04-08 (×2): qty 8.8

## 2021-04-08 MED ORDER — NICOTINE 21 MG/24HR TD PT24
21.0000 mg | MEDICATED_PATCH | Freq: Every day | TRANSDERMAL | Status: DC
  Administered 2021-04-08 – 2021-04-14 (×7): 21 mg via TRANSDERMAL
  Filled 2021-04-08 (×7): qty 1

## 2021-04-08 MED ORDER — ATORVASTATIN CALCIUM 10 MG PO TABS
10.0000 mg | ORAL_TABLET | Freq: Every day | ORAL | Status: DC
  Filled 2021-04-08: qty 1

## 2021-04-08 MED ORDER — METOPROLOL TARTRATE 50 MG PO TABS
100.0000 mg | ORAL_TABLET | Freq: Two times a day (BID) | ORAL | Status: DC
  Administered 2021-04-09 – 2021-04-12 (×7): 100 mg via ORAL
  Filled 2021-04-08 (×7): qty 2

## 2021-04-08 MED ORDER — DILTIAZEM HCL 30 MG PO TABS
60.0000 mg | ORAL_TABLET | Freq: Two times a day (BID) | ORAL | Status: DC
  Administered 2021-04-09 – 2021-04-12 (×7): 60 mg via ORAL
  Filled 2021-04-08 (×3): qty 2
  Filled 2021-04-08: qty 1
  Filled 2021-04-08 (×3): qty 2

## 2021-04-08 NOTE — Plan of Care (Signed)
  Problem: Self-Care: Goal: Ability to participate in self-care as condition permits will improve Outcome: Progressing Goal: Verbalization of feelings and concerns over difficulty with self-care will improve Outcome: Adequate for Discharge Goal: Ability to communicate needs accurately will improve Outcome: Adequate for Discharge

## 2021-04-08 NOTE — H&P (Signed)
TRH H&P    Patient Demographics:    Tammy Blair, is a 70 y.o. female  MRN: [id number]  DOB - 06/25/1951  Admit Date - 04/07/2021  Referring MD/NP/PA: Eulis Foster  Outpatient Primary MD for the patient is Corrington, Kip A, MD  Patient coming from: Home  Chief complaint- Right arm weakness   HPI:    Tammy Blair  is a 70 y.o. female, with history of lumbar radiculopathy, hypertension, diabetes mellitus without complication, COPD with chronic respiratory failure requiring 2.5 to 3 L nasal cannula continuously, anxiety and more presents to the ED with a chief complaint of right arm weakness.  Patient initially told the ER that has been going on for 1 week, she tells me 2-3 weeks.  Sister at bedside thinks is closer to 3 weeks duration.  Patient reports that it was sudden in onset.  She has no associated pain.  She reports no numbness or paresthesias.  Patient reports no right leg weakness-but this is different on exam.  She denies dysphagia, difficulty speaking, chest pain, palpitations, change in vision, change in hearing.  She does report a fall between the bathtub and the commode.  She was trying to lean onto her weak arm, and it did not work, so she fell to the floor.  She denies hitting her head, denies loss of consciousness.  She does admit to low back pain after the fall.  Patient denies taking any pain medication for low back pain that she does not see a pain doctor.  Patient also admits to postnasal drip with cough productive of yellow sputum.  She is a current smoker.  Patient has no other complaints at this time.  Patient reports that she does smoke but she is working on quitting.  She does not drink alcohol.  She does not use illicit drugs.  She reports she is not vaccinated for COVID.  She is full code.  In the ED Temp 98.2, heart rate 65-71, respiratory 16-21, blood pressure 133/74, satting at 95% on 2 L Leukocytosis  with a white blood cell count of 12.5, hemoglobin stable at 15.4 Hypokalemia at 3.3 CT head shows moderate to large area of low density in the left frontal lobe suspicious for subacute infarct.  Shows high density within both left and right MCA and recommend CTA or MRA.  Also shows likely acute sinusitis Alcohol level is less than 10 EKG shows a heart rate of 67, QTC 426 with baseline wander Admission requested for CVA work-up    Review of systems:    In addition to the HPI above,  No Fever-chills, No Headache, No changes with Vision or hearing, No problems swallowing food or Liquids, No Chest pain, admits to productive cough  no Abdominal pain, No Nausea or Vomiting, bowel movements are regular, No Blood in stool or Urine, No dysuria, No new skin rashes or bruises, No new joints pains-aches,  No recent weight gain or loss, No polyuria, polydypsia or polyphagia, No significant Mental Stressors.  All other systems reviewed and are negative.  Past History of the following :    Past Medical History:  Diagnosis Date  . Anxiety   . Asthma   . Chronic back pain   . COPD (chronic obstructive pulmonary disease) ([place])    on home O2  . Diabetes mellitus without complication ([place])   . HOH (hard of hearing)   . Hypertension   . Lumbar radiculopathy       Past Surgical History:  Procedure Laterality Date  . CESAREAN SECTION    . TONSILLECTOMY        Social History:      Social History   Tobacco Use  . Smoking status: Current Every Day Smoker    Packs/day: 0.50    Years: 40.00    Pack years: 20.00    Types: Cigarettes  . Smokeless tobacco: Never Used  Substance Use Topics  . Alcohol use: No       Family History :     Family History  Problem Relation Age of Onset  . COPD Mother   . Cancer Mother        kidney  . Stroke Brother   . Asthma Sister       Home Medications:   Prior to Admission medications   Medication Sig Start Date End Date Taking?  Authorizing Provider  albuterol (PROVENTIL HFA;VENTOLIN HFA) 108 (90 Base) MCG/ACT inhaler Inhale 1-2 puffs into the lungs every 4 (four) hours as needed for wheezing or shortness of breath. 03/14/16  Yes Mesner, Corene Cornea, MD  albuterol (PROVENTIL) (2.5 MG/3ML) 0.083% nebulizer solution Take 3 mLs (2.5 mg total) by nebulization every 4 (four) hours as needed. For shortness of breath 06/23/18  Yes Kathie Dike, MD  atorvastatin (LIPITOR) 10 MG tablet Take 10 mg by mouth daily. 12/10/20  Yes [provider]  budesonide-formoterol (SYMBICORT) 160-4.5 MCG/ACT inhaler Inhale 1 puff into the lungs 2 (two) times daily. 06/23/18  Yes Kathie Dike, MD  chlorthalidone (HYGROTON) 25 MG tablet Take 25 mg by mouth daily. With food 12/10/20  Yes [provider]  cyclobenzaprine (FLEXERIL) 10 MG tablet Take 0.5 tablets (5 mg total) by mouth 3 (three) times daily as needed for muscle spasms. For muscle spasm 10/12/17  Yes Kathie Dike, MD  diltiazem (CARDIZEM) 60 MG tablet Take 60 mg by mouth 2 (two) times daily.   Yes [provider]  famotidine (PEPCID) 20 MG tablet Take 20 mg by mouth daily. 04/22/20  Yes [provider]  gabapentin (NEURONTIN) 300 MG capsule Take 400 mg by mouth 3 (three) times daily.   Yes [provider]  malathion (OVIDE) 0.5 % lotion Apply topically. 03/29/21  Yes [provider]  metFORMIN (GLUCOPHAGE-XR) 500 MG 24 hr tablet Take 500 mg by mouth in the morning and at bedtime. With supper.   Yes [provider]  metoprolol tartrate (LOPRESSOR) 100 MG tablet Take 1 tablet by mouth 2 (two) times daily. 03/23/18  Yes [provider]  ALPRAZolam Duanne Moron) 0.5 MG tablet Take 0.5 mg by mouth 3 (three) times daily as needed for anxiety. For anxiety Patient not taking: No sig reported    [provider]  naproxen (NAPROSYN) 500 MG tablet Take 1 tablet (500 mg total) by mouth 2 (two) times daily with a meal. Patient not  taking: No sig reported 08/20/17   Noemi Chapel, MD  Oxycodone HCl 10 MG TABS Take 0.5 tablets (5 mg total) by mouth 4 (four) times daily as needed. Patient not taking: No sig reported 10/12/17  Kathie Dike, MD  predniSONE (DELTASONE) 10 MG tablet Take 40mg  po daily for 2 days then 30mg  daily for 2 days then 20mg  daily for 2 days then 10mg  daily for 2 days then stop Patient not taking: No sig reported 09/18/18   Kathie Dike, MD  ranitidine (ZANTAC) 150 MG tablet Take 150 mg by mouth 2 (two) times daily. Patient not taking: No sig reported    [provider]  tiotropium (SPIRIVA) 18 MCG inhalation capsule Place 1 capsule (18 mcg total) into inhaler and inhale daily. Patient not taking: No sig reported 06/23/18   Kathie Dike, MD  TRIAMCINOLONE ACETONIDE, TOP, 0.05 % OINT Apply 1 application topically 2 (two) times daily as needed. Patient not taking: No sig reported 12/28/15   [provider]  valACYclovir (VALTREX) 1000 MG tablet TAKE (1) TABLET BY MOUTH (3) TIMES DAILY. Patient not taking: No sig reported 02/16/20   [provider]     Allergies:    No Known Allergies   Physical Exam:   Vitals  Blood pressure (!) 153/72, pulse 72, temperature 97.7 F (36.5 C), temperature source Oral, resp. rate 18, height 5\' 9"  (1.753 m), weight 91.6 kg, SpO2 96 %.  1.  General: Patient sitting up in bed no acute distress  2. Psychiatric: Mood is anxious, behavior is normal, alert, oriented x3  3. Neurologic: Face is symmetric, speech and language are normal, 3 out of 5 strength in the right upper extremity, 4 out of 5 strength in the right lower extremity, 5 out of 5 in the left upper and lower extremity, no sensory deficit, able to complete finger-to-nose and heel-to-shin with left extremities, but not with right  4. HEENMT:  Head is atraumatic, normocephalic, pupils are reactive to light, neck is supple, trachea is midline, mucous membranes are moist, extremely  hard of hearing  5. Respiratory : Lungs are clear to auscultation bilaterally without wheezes, rhonchi, rales, no increased work of breathing, 2 L nasal cannula in place  6. Cardiovascular : Heart rate is normal, rhythm is regular, no murmurs rubs or gallops, no peripheral edema  7. Gastrointestinal:  Abdomen is soft, nondistended, nontender to palpation, no palpable masses, bowel sounds active  8. Skin:  Skin is warm dry and intact on limited exam, small Band-Aid on right lower extremity at the calf, bruising on right arm  9.Musculoskeletal:  No peripheral edema, no acute deformity, peripheral pulses palpated    Data Review:    CBC Recent Labs  Lab 04/07/21 2107  WBC 12.5*  HGB 15.0  HCT 46.0  PLT 301  MCV 96.8  MCH 31.6  MCHC 32.6  RDW 12.5  LYMPHSABS 2.0  MONOABS 1.1*  EOSABS 1.0*  BASOSABS 0.1   ------------------------------------------------------------------------------------------------------------------  Results for orders placed or performed during the hospital encounter of 04/07/21 (from the past 48 hour(s))  Comprehensive metabolic panel     Status: Abnormal   Collection Time: 04/07/21  9:07 PM  Result Value Ref Range   Sodium 134 (L) 135 - 145 mmol/L   Potassium 3.3 (L) 3.5 - 5.1 mmol/L   Chloride 90 (L) 98 - 111 mmol/L   CO2 35 (H) 22 - 32 mmol/L   Glucose, Bld 111 (H) 70 - 99 mg/dL    Comment: Glucose reference range applies only to samples taken after fasting for at least 8 hours.   BUN 18 8 - 23 mg/dL   Creatinine, Ser 0.63 0.44 - 1.00 mg/dL   Calcium 9.3 8.9 - 10.3  mg/dL   Total Protein 8.2 (H) 6.5 - 8.1 g/dL   Albumin 3.9 3.5 - 5.0 g/dL   AST 27 15 - 41 U/L   ALT 28 0 - 44 U/L   Alkaline Phosphatase 58 38 - 126 U/L   Total Bilirubin 0.5 0.3 - 1.2 mg/dL   GFR, Estimated >60 >60 mL/min    Comment: (NOTE) Calculated using the CKD-EPI Creatinine Equation (2021)    Anion gap 9 5 - 15    Comment: Performed at [facility], [address], [place], [place] [address]  CBC with Differential     Status: Abnormal   Collection Time: 04/07/21  9:07 PM  Result Value Ref Range   WBC 12.5 (H) 4.0 - 10.5 K/uL   RBC 4.75 3.87 - 5.11 MIL/uL   Hemoglobin 15.0 12.0 - 15.0 g/dL   HCT 46.0 36.0 - 46.0 %   MCV 96.8 80.0 - 100.0 fL   MCH 31.6 26.0 - 34.0 pg   MCHC 32.6 30.0 - 36.0 g/dL   RDW 12.5 11.5 - 15.5 %   Platelets 301 150 - 400 K/uL   nRBC 0.0 0.0 - 0.2 %   Neutrophils Relative % 65 %   Neutro Abs 8.4 (H) 1.7 - 7.7 K/uL   Lymphocytes Relative 16 %   Lymphs Abs 2.0 0.7 - 4.0 K/uL   Monocytes Relative 9 %   Monocytes Absolute 1.1 (H) 0.1 - 1.0 K/uL   Eosinophils Relative 8 %   Eosinophils Absolute 1.0 (H) 0.0 - 0.5 K/uL   Basophils Relative 1 %   Basophils Absolute 0.1 0.0 - 0.1 K/uL   Immature Granulocytes 1 %   Abs Immature Granulocytes 0.06 0.00 - 0.07 K/uL    Comment: Performed at [facility], [address] [place], [place] [address]  Ethanol     Status: None   Collection Time: 04/07/21  9:07 PM  Result Value Ref Range   Alcohol, Ethyl (B) <10 <10 mg/dL    Comment: (NOTE) Lowest detectable limit for serum alcohol is 10 mg/dL.  For medical purposes only. Performed at [facility], [address] [place], [place] [address]   Resp Panel by RT-PCR (Flu A&B, Covid) Nasopharyngeal Swab     Status: None   Collection Time: 04/07/21 11:02 PM   Specimen: Nasopharyngeal Swab; Nasopharyngeal(NP) swabs in vial transport medium  Result Value Ref Range   SARS Coronavirus 2 by RT PCR NEGATIVE NEGATIVE    Comment: (NOTE) SARS-CoV-2 target nucleic acids are NOT DETECTED.  The SARS-CoV-2 RNA is generally detectable in upper respiratory specimens during the acute phase of infection. The lowest concentration of SARS-CoV-2 viral copies this assay can detect is 138 copies/mL. A negative result does not preclude SARS-Cov-2 infection and should not be used as the sole basis for treatment or other patient management decisions. A  negative result may occur with  improper specimen collection/handling, submission of specimen other than nasopharyngeal swab, presence of viral mutation(s) within the areas targeted by this assay, and inadequate number of viral copies(<138 copies/mL). A negative result must be combined with clinical observations, patient history, and epidemiological information. The expected result is Negative.  Fact Sheet for Patients:  EntrepreneurPulse.com.au  Fact Sheet for Healthcare Providers:  IncredibleEmployment.be  This test is no t yet approved or cleared by the [place] FDA and  has been authorized for detection and/or diagnosis of SARS-CoV-2 by FDA under an Emergency Use Authorization (EUA). This EUA will remain  in effect (meaning this  test can be used) for the duration of the COVID-19 declaration under Section 564(b)(1) of the Act, 21 U.S.C.section 360bbb-3(b)(1), unless the authorization is terminated  or revoked sooner.       Influenza A by PCR NEGATIVE NEGATIVE   Influenza B by PCR NEGATIVE NEGATIVE    Comment: (NOTE) The Xpert Xpress SARS-CoV-2/FLU/RSV plus assay is intended as an aid in the diagnosis of influenza from Nasopharyngeal swab specimens and should not be used as a sole basis for treatment. Nasal washings and aspirates are unacceptable for Xpert Xpress SARS-CoV-2/FLU/RSV testing.  Fact Sheet for Patients: EntrepreneurPulse.com.au  Fact Sheet for Healthcare Providers: IncredibleEmployment.be  This test is not yet approved or cleared by the [place] FDA and has been authorized for detection and/or diagnosis of SARS-CoV-2 by FDA under an Emergency Use Authorization (EUA). This EUA will remain in effect (meaning this test can be used) for the duration of the COVID-19 declaration under Section 564(b)(1) of the Act, 21 U.S.C. section 360bbb-3(b)(1), unless the authorization is terminated  or revoked.  Performed at [facility], [address], [place], [place] [address]   Glucose, capillary     Status: Abnormal   Collection Time: 04/08/21  1:06 AM  Result Value Ref Range   Glucose-Capillary 117 (H) 70 - 99 mg/dL    Comment: Glucose reference range applies only to samples taken after fasting for at least 8 hours.    Chemistries  Recent Labs  Lab 04/07/21 2107  NA 134*  K 3.3*  CL 90*  CO2 35*  GLUCOSE 111*  BUN 18  CREATININE 0.63  CALCIUM 9.3  AST 27  ALT 28  ALKPHOS 58  BILITOT 0.5   ------------------------------------------------------------------------------------------------------------------  ------------------------------------------------------------------------------------------------------------------ GFR: Estimated Creatinine Clearance: 81.2 mL/min (by C-G formula based on SCr of 0.63 mg/dL). Liver Function Tests: Recent Labs  Lab 04/07/21 2107  AST 27  ALT 28  ALKPHOS 58  BILITOT 0.5  PROT 8.2*  ALBUMIN 3.9   No results for input(s): LIPASE, AMYLASE in the last 168 hours. No results for input(s): AMMONIA in the last 168 hours. Coagulation Profile: No results for input(s): INR, PROTIME in the last 168 hours. Cardiac Enzymes: No results for input(s): CKTOTAL, CKMB, CKMBINDEX, TROPONINI in the last 168 hours. BNP (last 3 results) No results for input(s): PROBNP in the last 8760 hours. HbA1C: No results for input(s): HGBA1C in the last 72 hours. CBG: Recent Labs  Lab 04/08/21 0106  GLUCAP 117*   Lipid Profile: No results for input(s): CHOL, HDL, LDLCALC, TRIG, CHOLHDL, LDLDIRECT in the last 72 hours. Thyroid Function Tests: No results for input(s): TSH, T4TOTAL, FREET4, T3FREE, THYROIDAB in the last 72 hours. Anemia Panel: No results for input(s): VITAMINB12, FOLATE, FERRITIN, TIBC, IRON, RETICCTPCT in the last 72  hours.  --------------------------------------------------------------------------------------------------------------- Urine analysis:    Component Value Date/Time   COLORURINE YELLOW 06/21/2018 [place] 06/21/2018 1233   LABSPEC 1.019 06/21/2018 1233   PHURINE 5.0 06/21/2018 1233   GLUCOSEU NEGATIVE 06/21/2018 1233   HGBUR NEGATIVE 06/21/2018 1233   BILIRUBINUR NEGATIVE 06/21/2018 1233   KETONESUR NEGATIVE 06/21/2018 1233   PROTEINUR NEGATIVE 06/21/2018 1233   UROBILINOGEN 0.2 07/17/2014 1613   NITRITE NEGATIVE 06/21/2018 1233   LEUKOCYTESUR NEGATIVE 06/21/2018 1233      Imaging Results:    CT Head Wo Contrast  Result Date: 04/07/2021 CLINICAL DATA:  Right arm weakness for the past week. EXAM: CT HEAD WITHOUT CONTRAST TECHNIQUE: Contiguous axial images were obtained from the base of  the skull through the vertex without intravenous contrast. COMPARISON:  Head CT 10/07/2017 FINDINGS: Brain: Moderately large area of low-density in the subcortical white matter of the left frontal lobe suspicious for subacute infarct. Small area of subcortical low-density involving the posterior right frontal lobe may also represent subacute ischemia. No hemorrhage. Findings are new from 2018 exam. No hydrocephalus or midline shift. No subdural or extra-axial collection. Vascular: There is high-density within both the left and right MCAs, however this is more prominent on the left. Skull: No fracture or focal lesion. Sinuses/Orbits: Left maxillary sinus mucosal thickening with bubbly debris and fluid level. Prior left mastoidectomy. Opacification of right mastoid air cells. Other: None. IMPRESSION: 1. Moderately large area of low-density in the subcortical white matter of the left frontal lobe suspicious for subacute infarct. Small area of subcortical low-density involving the posterior right frontal lobe may also represent subacute ischemia. Findings are new from 2018 exam. Consider MRI for  confirmation. 2. High-density within both the left and right MCAs, however this is more prominent on the left. CTA or MRA recommended. 3. Left maxillary sinus mucosal thickening with bubbly debris and fluid level, can be seen with acute sinusitis. Electronically Signed   By: Keith Rake M.D.   On: 04/07/2021 21:30   DG CHEST PORT 1 VIEW  Result Date: 04/07/2021 CLINICAL DATA:  Cough EXAM: PORTABLE CHEST 1 VIEW COMPARISON:  09/27/2018 FINDINGS: The lungs are symmetrically well expanded. A 3.6 cm mass is developed within the right upper lobe. No pneumothorax or pleural effusion. Cardiac size within normal limits. Pulmonary vascularity is normal. No acute bone abnormality. IMPRESSION: Interval development of a 3.6 cm mass within the right upper lobe. Dedicated CT imaging is recommended for further evaluation. Electronically Signed   By: Fidela Salisbury MD   On: 04/07/2021 23:26       Assessment & Plan:    Principal Problem:   CVA (cerebral vascular accident) [facility]) Active Problems:   Tobacco use disorder   Type 2 diabetes mellitus without complication (HCC)   Hypokalemia   Leukocytosis   1. CVA 1. MRI/MRA in the a.m. 2. CT head shows subacute infarct 3. PT OT ST eval and treat 4. Consult neuro 5. Echo in the a.m. 6. Monitor on telemetry 2. Fall 1. Continue PT 3. Hypokalemia 1. Replace and recheck 2. Check Mag in the AM 4. Tobacco use disorder 1. Extensive counseling on cessation - and risks of smoking while on oxygen 2. Nicotine patch ordered 5. COPD with chronic respiratory failure 1. Continue 2.5 - 3L nasal cannula O2 supplementation 2. Continue home meds 6. DMII 1. Hold metformin 2. Sliding scale insulin 3. Hgb A1C pending 7. Sinusitis 1. Continue Augmentin 2. Leukocytosis 12.5 3. CXR w/o signs of infection 4. UA pending 5. Sinusitis on CT head 8.    DVT Prophylaxis-Heparin- SCDs  AM Labs Ordered, also please review Full Orders  Family Communication:  Admission, patients condition and plan of care including tests being ordered have been discussed with the patient and Sister who indicate understanding and agree with the plan and Code Status.  Code Status: FULL  Admission status: Observation Time spent in minutes : [place]

## 2021-04-08 NOTE — Evaluation (Signed)
Occupational Therapy Evaluation Patient Details Name: Tammy Blair MRN: [id number] DOB: Mar 06, 1951 Today's Date: 04/08/2021    History of Present Illness Tammy Blair  is a 70 y.o. female, with history of lumbar radiculopathy, hypertension, diabetes mellitus without complication, COPD with chronic respiratory failure requiring 2.5 to 3 L nasal cannula continuously, anxiety and more presents to the ED with a chief complaint of right arm weakness.  Patient initially told the ER that has been going on for 1 week, she tells me 2-3 weeks.  Sister at bedside thinks is closer to 3 weeks duration.  Patient reports that it was sudden in onset.  She has no associated pain.  She reports no numbness or paresthesias.  Patient reports no right leg weakness-but this is different on exam.  She denies dysphagia, difficulty speaking, chest pain, palpitations, change in vision, change in hearing.  She does report a fall between the bathtub and the commode.  She was trying to lean onto her weak arm, and it did not work, so she fell to the floor.  She denies hitting her head, denies loss of consciousness.  She does admit to low back pain after the fall.  Patient denies taking any pain medication for low back pain that she does not see a pain doctor.  Patient also admits to postnasal drip with cough productive of yellow sputum.  She is a current smoker.  Patient has no other complaints at this time.   Clinical Impression   Pt agreeable to OT/PT evaluation this date. 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 Pt requires Mod A for functional transfers from EOB to chair with ~5 feet of ambulation prior to sitting in the chair. Total assist needed this date at EOB to don socks. Pt likely could operate at a higher level with AE. Pt demonstrates R UE hemiplegia with ~20* or less of A/ROM for shoulder flexion. ~110* R elbow flexion. 2+/5 grip strength. P/ROM or R UE WFL. R UE moderately flaccid with little  activation of shoulder movement. Pt will benefit from continued OT in the hospital and recommended venue below to increase strength, balance, ROM, and endurance for safe ADL's.     Follow Up Recommendations  CIR    Equipment Recommendations  None recommended by OT           Precautions / Restrictions Precautions Precautions: Fall Restrictions Weight Bearing Restrictions: No      Mobility Bed Mobility Overal bed mobility: Needs Assistance Bed Mobility: Supine to Sit     Supine to sit: Supervision;Min guard     General bed mobility comments: slow labored movement    Transfers Overall transfer level: Needs assistance Equipment used: Rolling walker (2 wheeled);None;1 person hand held assist Transfers: Sit to/from [facility] Sit to Stand: Min assist Stand pivot transfers: Mod assist       General transfer comment: very unsteady on feet having to lean on armrest of chair during transfer without AD, patient unable to grip RW with right hand when using RW    Balance Overall balance assessment: Needs assistance Sitting-balance support: Feet supported;No upper extremity supported Sitting balance-Leahy Scale: Fair Sitting balance - Comments: fair/good seated at EOB   Standing balance support: During functional activity;No upper extremity supported Standing balance-Leahy Scale: Poor Standing balance comment: fair/poor using RW  ADL either performed or assessed with clinical judgement   ADL Overall ADL's : Needs assistance/impaired Eating/Feeding: Minimal assistance;Sitting   Grooming: Minimal assistance;Sitting   Upper Body Bathing: Minimal assistance;Sitting   Lower Body Bathing: Moderate assistance;Minimal assistance;Sitting/lateral leans   Upper Body Dressing : Minimal assistance;Sitting   Lower Body Dressing: Total assistance;Sitting/lateral leans Lower Body Dressing Details (indicate cue type and reason):  doning socks seated at EOB Toilet Transfer: Moderate assistance;Stand-pivot;RW Toilet Transfer Details (indicate cue type and reason): Simulated via stand pivot/ambulatory transfer using RW from EOB to chair. Toileting- Clothing Manipulation and Hygiene: Minimal assistance;Moderate assistance;Sitting/lateral lean   Tub/ Shower Transfer: Moderate assistance;Maximal assistance;Stand-pivot   Functional mobility during ADLs: Moderate assistance;Maximal assistance;Rolling walker General ADL Comments: Other then lower body dressing, ADL status completed via clinical judgment based on performance during evaluation with transfers and ROM/strength testing.     Vision Baseline Vision/History: Wears glasses Wears Glasses: Reading only Patient Visual Report: No change from baseline Vision Assessment?: Yes Tracking/Visual Pursuits: Able to track stimulus in all quads without difficulty Convergence: Impaired (comment) (Unbale to converge when tested with prompting to watch finger to noe.)                Pertinent Vitals/Pain Pain Assessment: 0-10 Pain Score: 8  Pain Location: R hip and low back Pain Descriptors / Indicators: Grimacing Pain Intervention(s): Limited activity within patient's tolerance;Monitored during session     Hand Dominance Right   Extremity/Trunk Assessment Upper Extremity Assessment Upper Extremity Assessment: Defer to OT evaluation RUE Deficits / Details: Presenting with R side hemiplegia. 2+/5 shoulder MMT. Elobow flexion A/ROM ~110*. 2+/5 grip strength. Unable to complete composite digit extension. P/ROM WFL grossly. RUE Coordination: decreased fine motor   Lower Extremity Assessment Lower Extremity Assessment: RLE deficits/detail RLE Deficits / Details: grossly -4/5 RLE Sensation: WNL RLE Coordination: WNL   Cervical / Trunk Assessment Cervical / Trunk Assessment: Normal   Communication Communication Communication: HOH   Cognition Arousal/Alertness:  Awake/alert Behavior During Therapy: WFL for tasks assessed/performed Overall Cognitive Status: Within Functional Limits for tasks assessed                                                      Home Living Family/patient expects to be discharged to:: Private residence Living Arrangements: Children Available Help at Discharge: Available PRN/intermittently;Family Type of Home: Mobile home Home Access: Stairs to enter Entrance Stairs-Number of Steps: 8 Entrance Stairs-Rails: Can reach both;Right;Left Home Layout: One level     Bathroom Shower/Tub: Teacher, early years/pre: Handicapped height Bathroom Accessibility: Yes   Home Equipment: Environmental consultant - 4 wheels;Wheelchair - manual;Cane - single point;Bedside commode          Prior Functioning/Environment Level of Independence: Needs assistance  Gait / Transfers Assistance Needed: Household amulatory without AD; use of RW when going to doctor per pt report. ADL's / Homemaking Assistance Needed: indpendent ADL's; son assist with IADL's            OT Problem List: Decreased strength;Decreased range of motion;Decreased activity tolerance;Impaired balance (sitting and/or standing);Decreased coordination      OT Treatment/Interventions: Self-care/ADL training;Therapeutic exercise;Therapeutic activities;Manual therapy;Modalities;Splinting;Neuromuscular education;Energy conservation;Patient/family education;Balance training    OT Goals(Current goals can be found in the care plan section) Acute Rehab OT Goals Patient Stated Goal: return home with assist OT Goal Formulation: With patient  Time For Goal Achievement: 04/22/21 Potential to Achieve Goals: Fair  OT Frequency: Min 2X/week               Co-evaluation PT/OT/SLP Co-Evaluation/Treatment: Yes Reason for Co-Treatment: Complexity of the patient's impairments (multi-system involvement);For patient/therapist safety;To address functional/ADL  transfers PT goals addressed during session: Mobility/safety with mobility;Balance;Proper use of DME OT goals addressed during session: ADL's and self-care;Strengthening/ROM      AM-PAC OT "6 Clicks" Daily Activity     Outcome Measure Help from another person eating meals?: A Little Help from another person taking care of personal grooming?: A Little Help from another person toileting, which includes using toliet, bedpan, or urinal?: A Lot Help from another person bathing (including washing, rinsing, drying)?: A Lot Help from another person to put on and taking off regular upper body clothing?: A Little Help from another person to put on and taking off regular lower body clothing?: A Lot 6 Click Score: 15   End of Session Equipment Utilized During Treatment: Rolling walker  Activity Tolerance: Patient tolerated treatment well Patient left: in chair;with call bell/phone within reach;with chair alarm set  OT Visit Diagnosis: Unsteadiness on feet (R26.81);Muscle weakness (generalized) (M62.81);History of falling (Z91.81);Other abnormalities of gait and mobility (R26.89);Other symptoms and signs involving the nervous system (R29.898);Hemiplegia and hemiparesis Hemiplegia - Right/Left: Right Hemiplegia - dominant/non-dominant: Dominant 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

## 2021-04-08 NOTE — Progress Notes (Signed)
Inpatient Rehabilitation Admissions Coordinator  Inpatient rehab consult received. I await therapy evals to begin rehab assessment.   Danne Baxter, RN, MSN Rehab Admissions Coordinator [phone] 04/08/2021 11:49 AM

## 2021-04-08 NOTE — Evaluation (Signed)
Physical Therapy Evaluation Patient Details Name: Tammy Blair MRN: [id number] DOB: 01/27/1951 Today's Date: 04/08/2021   History of Present Illness  Tammy Blair  is a 70 y.o. female, with history of lumbar radiculopathy, hypertension, diabetes mellitus without complication, COPD with chronic respiratory failure requiring 2.5 to 3 L nasal cannula continuously, anxiety and more presents to the ED with a chief complaint of right arm weakness.  Patient initially told the ER that has been going on for 1 week, she tells me 2-3 weeks.  Sister at bedside thinks is closer to 3 weeks duration.  Patient reports that it was sudden in onset.  She has no associated pain.  She reports no numbness or paresthesias.  Patient reports no right leg weakness-but this is different on exam.  She denies dysphagia, difficulty speaking, chest pain, palpitations, change in vision, change in hearing.  She does report a fall between the bathtub and the commode.  She was trying to lean onto her weak arm, and it did not work, so she fell to the floor.  She denies hitting her head, denies loss of consciousness.  She does admit to low back pain after the fall.  Patient denies taking any pain medication for low back pain that she does not see a pain doctor.  Patient also admits to postnasal drip with cough productive of yellow sputum.  She is a current smoker.  Patient has no other complaints at this time.    Clinical Impression  Patient presents with right sided weakness and unable to grip RW with right hand due weakness, very unsteady on feet having to lean on nearby objects for support when not using AD, required tactile assistance to hold right hand onto RW during gait training demonstrating slow labored movement with incoordination of RW resulting in scissoring of legs when making turns, limited for activity mostly due to c/o fatigue.  Patient tolerated sitting up in chair after therapy - RN notified.  Patient will benefit from continued  physical therapy in hospital and recommended venue below to increase strength, balance, endurance for safe ADLs and gait.     Follow Up Recommendations CIR    Equipment Recommendations  None recommended by PT    Recommendations for Other Services       Precautions / Restrictions Precautions Precautions: Fall Restrictions Weight Bearing Restrictions: No      Mobility  Bed Mobility Overal bed mobility: Needs Assistance Bed Mobility: Supine to Sit     Supine to sit: Supervision;Min guard     General bed mobility comments: slow labored movement    Transfers Overall transfer level: Needs assistance Equipment used: Rolling walker (2 wheeled);None;1 person hand held assist Transfers: Sit to/from [facility] Sit to Stand: Min assist Stand pivot transfers: Mod assist       General transfer comment: very unsteady on feet having to lean on armrest of chair during transfer without AD, patient unable to grip RW with right hand when using RW  Ambulation/Gait Ambulation/Gait assistance: Mod assist;Max assist Gait Distance (Feet): 10 Feet Assistive device: Rolling walker (2 wheeled) Gait Pattern/deviations: Decreased step length - right;Decreased step length - left;Decreased stride length;Ataxic;Scissoring Gait velocity: decreased   General Gait Details: patient very unsteady on feet with incoordination of RLE when taking steps and scissoring of legs during turns, unable to maintain grip on RW with right hand requiring tactile assistance to hold right hand onto W. Tammy Blair  Mobility    Modified Rankin (Stroke Patients Only)       Balance Overall balance assessment: Needs assistance Sitting-balance support: Feet supported;No upper extremity supported Sitting balance-Leahy Scale: Fair Sitting balance - Comments: fair/good seated at EOB   Standing balance support: During functional activity;No upper extremity supported Standing  balance-Leahy Scale: Poor Standing balance comment: fair/poor using RW                             Pertinent Vitals/Pain Pain Assessment: 0-10 Pain Score: 8  Pain Location: R hip and low back Pain Descriptors / Indicators: Grimacing Pain Intervention(s): Limited activity within patient's tolerance;Monitored during session;Repositioned    Home Living Family/patient expects to be discharged to:: Private residence Living Arrangements: Children Available Help at Discharge: Available PRN/intermittently;Family Type of Home: Mobile home Home Access: Stairs to enter Entrance Stairs-Rails: Can reach both;Right;Left Entrance Stairs-Number of Steps: 8 Home Layout: One level Home Equipment: Walker - 4 wheels;Wheelchair - manual;Cane - single point;Bedside commode      Prior Function Level of Independence: Needs assistance   Gait / Transfers Assistance Needed: Household amulatory without AD; use of RW when going to doctor per pt report.  ADL's / Homemaking Assistance Needed: indpendent ADL's; son assist with IADL's        Hand Dominance   Dominant Hand: Right    Extremity/Trunk Assessment   Upper Extremity Assessment Upper Extremity Assessment: Defer to OT evaluation RUE Deficits / Details: Presenting with R side hemiplegia. 2+/5 shoulder MMT. Elobow flexion A/ROM ~110*. 2+/5 grip strength. Unable to complete composite digit extension. P/ROM WFL grossly.    Lower Extremity Assessment Lower Extremity Assessment: RLE deficits/detail RLE Deficits / Details: grossly -4/5 RLE Sensation: WNL RLE Coordination: WNL    Cervical / Trunk Assessment Cervical / Trunk Assessment: Normal  Communication   Communication: HOH  Cognition Arousal/Alertness: Awake/alert Behavior During Therapy: WFL for tasks assessed/performed Overall Cognitive Status: Within Functional Limits for tasks assessed                                        General Comments       Exercises     Assessment/Plan    PT Assessment Patient needs continued PT services  PT Problem List Decreased strength;Decreased activity tolerance;Decreased balance;Decreased mobility       PT Treatment Interventions DME instruction;Gait training;Stair training;Functional mobility training;Therapeutic activities;Therapeutic exercise;Patient/family education;Balance training;Neuromuscular re-education    PT Goals (Current goals can be found in the Care Plan section)  Acute Rehab PT Goals Patient Stated Goal: return home with assist PT Goal Formulation: With patient Time For Goal Achievement: 04/22/21 Potential to Achieve Goals: Good    Frequency Min 5X/week   Barriers to discharge        Co-evaluation PT/OT/SLP Co-Evaluation/Treatment: Yes Reason for Co-Treatment: Complexity of the patient's impairments (multi-system involvement);For patient/therapist safety;To address functional/ADL transfers PT goals addressed during session: Mobility/safety with mobility;Balance;Proper use of DME OT goals addressed during session: ADL's and self-care;Strengthening/ROM       AM-PAC PT "6 Clicks" Mobility  Outcome Measure Help needed turning from your back to your side while in a flat bed without using bedrails?: None Help needed moving from lying on your back to sitting on the side of a flat bed without using bedrails?: A Little Help needed moving to and from a bed to a chair (including  a wheelchair)?: A Lot Help needed standing up from a chair using your arms (e.g., wheelchair or bedside chair)?: A Lot Help needed to walk in hospital room?: A Lot Help needed climbing 3-5 steps with a railing? : Total 6 Click Score: 14    End of Session Equipment Utilized During Treatment: Oxygen Activity Tolerance: Patient tolerated treatment well;Patient limited by fatigue Patient left: in chair;with call bell/phone within reach;with chair alarm set Nurse Communication: Mobility status PT Visit  Diagnosis: Unsteadiness on feet (R26.81);Other abnormalities of gait and mobility (R26.89);Muscle weakness (generalized) (M62.81)    Time: 9471-2527 PT Time Calculation (min) (ACUTE ONLY): 25 min   Charges:   PT Evaluation $PT Eval Moderate Complexity: 1 Mod PT Treatments $Therapeutic Activity: 23-37 mins        12:03 PM, 04/08/21 Lonell Grandchild, MPT Physical Therapist with [facility] [phone] office [phone] mobile phone

## 2021-04-08 NOTE — Plan of Care (Signed)
  Problem: Acute Rehab PT Goals(only PT should resolve) Goal: Pt Will Go Supine/Side To Sit Outcome: Progressing Flowsheets (Taken 04/08/2021 1204) Pt will go Supine/Side to Sit:  with modified independence  with supervision Goal: Patient Will Transfer Sit To/From Stand Outcome: Progressing Flowsheets (Taken 04/08/2021 1204) Patient will transfer sit to/from stand:  with minimal assist  with min guard assist Goal: Pt Will Transfer Bed To Chair/Chair To Bed Outcome: Progressing Flowsheets (Taken 04/08/2021 1204) Pt will Transfer Bed to Chair/Chair to Bed:  with min assist  min guard assist Goal: Pt Will Ambulate Outcome: Progressing Flowsheets (Taken 04/08/2021 1204) Pt will Ambulate:  50 feet  with minimal assist  with rolling walker Note: Hemi-walker/quad cane   12:04 PM, 04/08/21 Lonell Grandchild, MPT Physical Therapist with [facility] [phone] office [phone] mobile phone

## 2021-04-08 NOTE — Progress Notes (Addendum)
Inpatient Rehabilitation Admissions Coordinator  I spoke with patient's daughter and son by phone. We discussed goals and expectations of a possible CIR admit. They prefer CIR at [facility] and then they will arrange 24/7 assist at home with children and adult grand children. I await medical workup completion and bed availability to admit to CIR. I will alert MD, acute team and TOC. She is a great candidate for CIR admit.  Danne Baxter, RN, MSN Rehab Admissions Coordinator [phone] 04/08/2021 3:52 PM   Noted oncology workup in progress. I will follow up next week.  Danne Baxter, RN, MSN Rehab Admissions Coordinator [phone] 04/08/2021 7:58 PM

## 2021-04-08 NOTE — Clinical Social Work Note (Signed)
TOC informed that pt was to appear in court at 9am this morning. It was requested that [facility] send letter signed by Dr. Roger Shelter stating that pt was admitted and was unable to appear this morning. CSW faxed letter to the [facility].

## 2021-04-08 NOTE — Consult Note (Signed)
[place] Merlene Laughter, MD     [email or web address]          Tammy Blair is an 70 y.o. female.   ASSESSMENT/PLAN: 1. Subacute right hemiparesis initially thought to be stroke but imaging shows multiple lesions consistent with metastases. She is currently being seen by Oncology and treated appropriately. 2. Mild encephalopathy likely due to metastatic brain disease and also medication effect for the MRI.   This is a 70 year old white female who presents with about a 1-3 week history of the right-sided weakness especially involving the right upper extremity. She is severely hearing impaired which limits the evaluation. The initial presentation was 1 week is but apparently family member indicates that it has been close to the 3 weeks. 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 She has a longstanding history of smoking.  GENERAL:  She is mildly stuporous required stimulation.  HEENT:  She is severely hearing impaired which limits the evaluation. Neck is supple no trauma noted.  ABDOMEN: soft  EXTREMITIES: No edema   BACK: This is normal.  SKIN: No Right upper extremity to this plegic. The other extremities are 4+/5.  COORDINATION:  No dysmetria is noted. No rest tremor; no intention tremor; no postural tremor; no bradykinesia.  REFLEXES: Deep tendon reflexes are symmetrical and normal. .   SENSATION: Normal to pain.      Blood pressure 109/90, pulse 61, temperature 98 F (36.7 C), temperature source Oral, resp. rate 16, height 5\' 9"  (1.753 m), weight 91.6 kg, SpO2 99 %.  Past Medical History:  Diagnosis Date  . Anxiety   . Asthma   . Chronic back pain   . COPD (chronic obstructive pulmonary disease) ([place])    on home O2  . Diabetes mellitus without complication ([place])   . HOH (hard of hearing)   . Hypertension   . Lumbar radiculopathy     Past Surgical History:   Procedure Laterality Date  . CESAREAN SECTION    . TONSILLECTOMY      Family History  Problem Relation Age of Onset  . COPD Mother   . Cancer Mother        kidney  . Stroke Brother   . Asthma Sister     Social History:  reports that she has been smoking cigarettes. She has a 20.00 pack-year smoking history. She has never used smokeless tobacco. She reports previous drug use. She reports that she does not drink alcohol.  Allergies: No Known Allergies  Medications: Prior to Admission medications   Medication Sig Start Date End Date Taking? Authorizing Provider  albuterol (PROVENTIL HFA;VENTOLIN HFA) 108 (90 Base) MCG/ACT inhaler Inhale 1-2 puffs into the lungs every 4 (four) hours as needed for wheezing or shortness of breath. 03/14/16  Yes Mesner, Corene Cornea, MD  albuterol (PROVENTIL) (2.5 MG/3ML) 0.083% nebulizer solution Take 3 mLs (2.5 mg total) by nebulization every 4 (four) hours as needed. For shortness of breath 06/23/18  Yes Kathie Dike, MD  atorvastatin (LIPITOR) 10 MG tablet Take 10 mg by mouth daily. 12/10/20  Yes [provider]  budesonide-formoterol (SYMBICORT) 160-4.5 MCG/ACT inhaler Inhale 1 puff into the lungs 2 (two) times daily. 06/23/18  Yes Kathie Dike, MD  chlorthalidone (HYGROTON) 25 MG tablet Take 25 mg by mouth daily. With food 12/10/20  Yes [provider]  cyclobenzaprine (FLEXERIL) 10 MG tablet Take 0.5 tablets (5 mg total) by mouth 3 (three)  times daily as needed for muscle spasms. For muscle spasm 10/12/17  Yes Kathie Dike, MD  diltiazem (CARDIZEM) 60 MG tablet Take 60 mg by mouth 2 (two) times daily.   Yes [provider]  famotidine (PEPCID) 20 MG tablet Take 20 mg by mouth daily. 04/22/20  Yes [provider]  gabapentin (NEURONTIN) 300 MG capsule Take 400 mg by mouth 3 (three) times daily.   Yes [provider]  malathion (OVIDE) 0.5 % lotion Apply topically. 03/29/21  Yes [provider]   metFORMIN (GLUCOPHAGE-XR) 500 MG 24 hr tablet Take 500 mg by mouth in the morning and at bedtime. With supper.   Yes [provider]  metoprolol tartrate (LOPRESSOR) 100 MG tablet Take 1 tablet by mouth 2 (two) times daily. 03/23/18  Yes [provider]  ALPRAZolam Duanne Moron) 0.5 MG tablet Take 0.5 mg by mouth 3 (three) times daily as needed for anxiety. For anxiety Patient not taking: No sig reported    [provider]  naproxen (NAPROSYN) 500 MG tablet Take 1 tablet (500 mg total) by mouth 2 (two) times daily with a meal. Patient not taking: No sig reported 08/20/17   Noemi Chapel, MD  Oxycodone HCl 10 MG TABS Take 0.5 tablets (5 mg total) by mouth 4 (four) times daily as needed. Patient not taking: No sig reported 10/12/17   Kathie Dike, MD  predniSONE (DELTASONE) 10 MG tablet Take 40mg  po daily for 2 days then 30mg  daily for 2 days then 20mg  daily for 2 days then 10mg  daily for 2 days then stop Patient not taking: No sig reported 09/18/18   Kathie Dike, MD  ranitidine (ZANTAC) 150 MG tablet Take 150 mg by mouth 2 (two) times daily. Patient not taking: No sig reported    [provider]  tiotropium (SPIRIVA) 18 MCG inhalation capsule Place 1 capsule (18 mcg total) into inhaler and inhale daily. Patient not taking: No sig reported 06/23/18   Kathie Dike, MD  TRIAMCINOLONE ACETONIDE, TOP, 0.05 % OINT Apply 1 application topically 2 (two) times daily as needed. Patient not taking: No sig reported 12/28/15   [provider]  valACYclovir (VALTREX) 1000 MG tablet TAKE (1) TABLET BY MOUTH (3) TIMES DAILY. Patient not taking: No sig reported 02/16/20   [provider]    Scheduled Meds: . amoxicillin-clavulanate  1 tablet Oral Q12H  . aspirin  325 mg Oral Daily  . atorvastatin  80 mg Oral Daily  . chlorthalidone  25 mg Oral Daily  . dexamethasone  4 mg Oral Q6H  . [START ON 04/09/2021] diltiazem  60 mg Oral BID  . famotidine  20 mg Oral  Daily  . gabapentin  400 mg Oral TID  . heparin  5,000 Units Subcutaneous Q8H  . insulin aspart  0-15 Units Subcutaneous TID WC  . insulin aspart  0-5 Units Subcutaneous QHS  . iohexol      . [START ON 04/09/2021] metoprolol tartrate  100 mg Oral BID  . mometasone-formoterol  2 puff Inhalation BID  . nicotine  21 mg Transdermal Daily  . umeclidinium bromide  1 puff Inhalation Daily   Continuous Infusions: . sodium chloride 125 mL/hr at 04/08/21 1649   PRN Meds:.acetaminophen **OR** acetaminophen (TYLENOL) oral liquid 160 mg/5 mL **OR** acetaminophen, albuterol, cyclobenzaprine, ondansetron (ZOFRAN) IV, oxyCODONE     Results for orders placed or performed during the hospital encounter of 04/07/21 (from the past 48 hour(s))  Comprehensive metabolic panel     Status:  Abnormal   Collection Time: 04/07/21  9:07 PM  Result Value Ref Range   Sodium 134 (L) 135 - 145 mmol/L   Potassium 3.3 (L) 3.5 - 5.1 mmol/L   Chloride 90 (L) 98 - 111 mmol/L   CO2 35 (H) 22 - 32 mmol/L   Glucose, Bld 111 (H) 70 - 99 mg/dL    Comment: Glucose reference range applies only to samples taken after fasting for at least 8 hours.   BUN 18 8 - 23 mg/dL   Creatinine, Ser 0.63 0.44 - 1.00 mg/dL   Calcium 9.3 8.9 - 10.3 mg/dL   Total Protein 8.2 (H) 6.5 - 8.1 g/dL   Albumin 3.9 3.5 - 5.0 g/dL   AST 27 15 - 41 U/L   ALT 28 0 - 44 U/L   Alkaline Phosphatase 58 38 - 126 U/L   Total Bilirubin 0.5 0.3 - 1.2 mg/dL   GFR, Estimated >60 >60 mL/min    Comment: (NOTE) Calculated using the CKD-EPI Creatinine Equation (2021)    Anion gap 9 5 - 15    Comment: Performed at [facility], [address] [place], [place] [address]  CBC with Differential     Status: Abnormal   Collection Time: 04/07/21  9:07 PM  Result Value Ref Range   WBC 12.5 (H) 4.0 - 10.5 K/uL   RBC 4.75 3.87 - 5.11 MIL/uL   Hemoglobin 15.0 12.0 - 15.0 g/dL   HCT 46.0 36.0 - 46.0 %   MCV 96.8 80.0 - 100.0 fL   MCH 31.6 26.0 - 34.0 pg   MCHC 32.6  30.0 - 36.0 g/dL   RDW 12.5 11.5 - 15.5 %   Platelets 301 150 - 400 K/uL   nRBC 0.0 0.0 - 0.2 %   Neutrophils Relative % 65 %   Neutro Abs 8.4 (H) 1.7 - 7.7 K/uL   Lymphocytes Relative 16 %   Lymphs Abs 2.0 0.7 - 4.0 K/uL   Monocytes Relative 9 %   Monocytes Absolute 1.1 (H) 0.1 - 1.0 K/uL   Eosinophils Relative 8 %   Eosinophils Absolute 1.0 (H) 0.0 - 0.5 K/uL   Basophils Relative 1 %   Basophils Absolute 0.1 0.0 - 0.1 K/uL   Immature Granulocytes 1 %   Abs Immature Granulocytes 0.06 0.00 - 0.07 K/uL    Comment: Performed at [facility], [address], [place], [place] [address]  Ethanol     Status: None   Collection Time: 04/07/21  9:07 PM  Result Value Ref Range   Alcohol, Ethyl (B) <10 <10 mg/dL    Comment: (NOTE) Lowest detectable limit for serum alcohol is 10 mg/dL.  For medical purposes only. Performed at [facility], [address] [place], [place] [address]   Resp Panel by RT-PCR (Flu A&B, Covid) Nasopharyngeal Swab     Status: None   Collection Time: 04/07/21 11:02 PM   Specimen: Nasopharyngeal Swab; Nasopharyngeal(NP) swabs in vial transport medium  Result Value Ref Range   SARS Coronavirus 2 by RT PCR NEGATIVE NEGATIVE    Comment: (NOTE) SARS-CoV-2 target nucleic acids are NOT DETECTED.  The SARS-CoV-2 RNA is generally detectable in upper respiratory specimens during the acute phase of infection. The lowest concentration of SARS-CoV-2 viral copies this assay can detect is 138 copies/mL. A negative result does not preclude SARS-Cov-2 infection and should not be used as the sole basis for treatment or other patient management decisions. A negative result may occur with  improper specimen collection/handling,  submission of specimen other than nasopharyngeal swab, presence of viral mutation(s) within the areas targeted by this assay, and inadequate number of viral copies(<138 copies/mL). A negative result must be combined with clinical observations, patient  history, and epidemiological information. The expected result is Negative.  Fact Sheet for Patients:  EntrepreneurPulse.com.au  Fact Sheet for Healthcare Providers:  IncredibleEmployment.be  This test is no t yet approved or cleared by the [place] FDA and  has been authorized for detection and/or diagnosis of SARS-CoV-2 by FDA under an Emergency Use Authorization (EUA). This EUA will remain  in effect (meaning this test can be used) for the duration of the COVID-19 declaration under Section 564(b)(1) of the Act, 21 U.S.C.section 360bbb-3(b)(1), unless the authorization is terminated  or revoked sooner.       Influenza A by PCR NEGATIVE NEGATIVE   Influenza B by PCR NEGATIVE NEGATIVE    Comment: (NOTE) The Xpert Xpress SARS-CoV-2/FLU/RSV plus assay is intended as an aid in the diagnosis of influenza from Nasopharyngeal swab specimens and should not be used as a sole basis for treatment. Nasal washings and aspirates are unacceptable for Xpert Xpress SARS-CoV-2/FLU/RSV testing.  Fact Sheet for Patients: EntrepreneurPulse.com.au  Fact Sheet for Healthcare Providers: IncredibleEmployment.be  This test is not yet approved or cleared by the [place] FDA and has been authorized for detection and/or diagnosis of SARS-CoV-2 by FDA under an Emergency Use Authorization (EUA). This EUA will remain in effect (meaning this test can be used) for the duration of the COVID-19 declaration under Section 564(b)(1) of the Act, 21 U.S.C. section 360bbb-3(b)(1), unless the authorization is terminated or revoked.  Performed at [facility], [address] [place], [place] [address]   Glucose, capillary     Status: Abnormal   Collection Time: 04/08/21  1:06 AM  Result Value Ref Range   Glucose-Capillary 117 (H) 70 - 99 mg/dL    Comment: Glucose reference range applies only to samples taken after fasting for at  least 8 hours.  HIV Antibody (routine testing w rflx)     Status: None   Collection Time: 04/08/21  5:24 AM  Result Value Ref Range   HIV Screen 4th Generation wRfx Non Reactive Non Reactive    Comment: Performed at [facility], [place] [address] [place], [place] [address]  Hemoglobin A1c     Status: Abnormal   Collection Time: 04/08/21  5:24 AM  Result Value Ref Range   Hgb A1c MFr Bld 6.3 (H) 4.8 - 5.6 %    Comment: (NOTE) Pre diabetes:          5.7%-6.4%  Diabetes:              >6.4%  Glycemic control for   <7.0% adults with diabetes    Mean Plasma Glucose 134.11 mg/dL    Comment: Performed at [facility] [address], [place], [place] [address]  Lipid panel     Status: None   Collection Time: 04/08/21  5:24 AM  Result Value Ref Range   Cholesterol 126 0 - 200 mg/dL   Triglycerides 93 <150 mg/dL   HDL 41 >40 mg/dL   Total CHOL/HDL Ratio 3.1 RATIO   VLDL 19 0 - 40 mg/dL   LDL Cholesterol 66 0 - 99 mg/dL    Comment:        Total Cholesterol/HDL:CHD Risk Coronary Heart Disease Risk Table  Men   Women  1/2 Average Risk   3.4   3.3  Average Risk       5.0   4.4  2 X Average Risk   9.6   7.1  3 X Average Risk  23.4   11.0        Use the calculated Patient Ratio above and the CHD Risk Table to determine the patient's CHD Risk.        ATP III CLASSIFICATION (LDL):  <100     mg/dL   Optimal  100-129  mg/dL   Near or Above                    Optimal  130-159  mg/dL   Borderline  160-189  mg/dL   High  >190     mg/dL   Very High Performed at [place] [place], [place] [address]   Glucose, capillary     Status: Abnormal   Collection Time: 04/08/21  7:33 AM  Result Value Ref Range   Glucose-Capillary 105 (H) 70 - 99 mg/dL    Comment: Glucose reference range applies only to samples taken after fasting for at least 8 hours.  TSH     Status: Abnormal   Collection Time: 04/08/21  8:16 AM  Result Value Ref Range   TSH  6.632 (H) 0.350 - 4.500 uIU/mL    Comment: Performed by a 3rd Generation assay with a functional sensitivity of <=0.01 uIU/mL. Performed at [facility], [address], [place], [place] [address]   Vitamin B12     Status: None   Collection Time: 04/08/21  8:16 AM  Result Value Ref Range   Vitamin B-12 481 180 - 914 pg/mL    Comment: (NOTE) This assay is not validated for testing neonatal or myeloproliferative syndrome specimens for Vitamin B12 levels. Performed at [facility], [address] [place],  [address]   Glucose, capillary     Status: Abnormal   Collection Time: 04/08/21 11:09 AM  Result Value Ref Range   Glucose-Capillary 127 (H) 70 - 99 mg/dL    Comment: Glucose reference range applies only to samples taken after fasting for at least 8 hours.  Urinalysis, Routine w reflex microscopic Urine, Clean Catch     Status: Abnormal   Collection Time: 04/08/21  4:00 PM  Result Value Ref Range   Color, Urine YELLOW YELLOW   APPearance CLEAR CLEAR   Specific Gravity, Urine 1.012 1.005 - 1.030   pH 6.0 5.0 - 8.0   Glucose, UA NEGATIVE NEGATIVE mg/dL   Hgb urine dipstick NEGATIVE NEGATIVE   Bilirubin Urine NEGATIVE NEGATIVE   Ketones, ur NEGATIVE NEGATIVE mg/dL   Protein, ur NEGATIVE NEGATIVE mg/dL   Nitrite NEGATIVE NEGATIVE   Leukocytes,Ua SMALL (A) NEGATIVE   WBC, UA 6-10 0 - 5 WBC/hpf   Bacteria, UA RARE (A) NONE SEEN   Squamous Epithelial / LPF 0-5 0 - 5    Comment: Performed at [facility], [address], [place], [place] [address]  Glucose, capillary     Status: Abnormal   Collection Time: 04/08/21  4:00 PM  Result Value Ref Range   Glucose-Capillary 245 (H) 70 - 99 mg/dL    Comment: Glucose reference range applies only to samples taken after fasting for at least 8 hours.    Studies/Results: BRAIN MRI/MRA IMPRESSION: 1. Negative for acute infarct 2. Multiple intracranial mass lesions with surrounding vasogenic edema compatible with metastatic disease. Lung  mass noted on chest x-ray 04/07/2021. Recommend follow-up MRI brain with contrast to evaluate metastatic disease 3. Intracranial atherosclerotic disease especially the basilar and posterior cerebral arteries bilaterally. Negative for large vessel Occlusion.    The brain MRI is reviewed in person shows moderate to large posterior frontal lesion that is dark on T1. There are other multiple lesions also seen on T1 most noted FLAIR and DWI. Most of these especially larger ones are associated with vasogenic edema on FLAIR imaging. There are multiple of these lesions enhances with contrast.   Dainel Arcidiacono A. Merlene Laughter, M.D.  Diplomate, Tax adviser of Psychiatry and Neurology ( Neurology). 04/08/2021, 6:41 PM

## 2021-04-08 NOTE — PMR Pre-admission (Shared)
PMR Admission Coordinator Pre-Admission Assessment  Patient: Tammy Blair is an 70 y.o., female MRN: [id number] DOB: [birth year] Height: 5\' 9"  (175.3 cm) Weight: 91.6 kg  Insurance Information HMO: ***    PPO: ***     PCP: ***     IPA: ***     80/20: ***     OTHER: *** PRIMARY: ***      Policy#: ***      Subscriber: *** CM Name: ***      Phone#: ***     Fax#: *** Pre-Cert#: ***      Employer: *** Benefits:  Phone #: ***     Name: *** Eff. Date: ***     Deduct: ***      Out of Pocket Max: ***      Life Max: *** CIR: ***      SNF: *** Outpatient: ***     Co-Pay: *** Home Health: ***      Co-Pay: *** DME: ***     Co-Pay: *** Providers: *** SECONDARY: ***      Policy#: ***     Phone#: ***  Financial Counselor: ***      Phone#: ***  The "Data Collection Information Summary" for patients in Inpatient Rehabilitation Facilities with attached "Privacy Act [employer] Records" was provided and verbally reviewed with: {CHL IP Patient Family IW:[id number]}  Emergency Contact Information Contact Information    Name Relation Home Work [place], [place] Daughter   [phone]   Karie Schwalbe [phone]     [place], [place]   [phone]      Current Medical History  Patient Admitting Diagnosis: *** History of Present Illness: *** Complete NIHSS TOTAL: 6  Patient's medical record from *** has been reviewed by the rehabilitation admission coordinator and physician.  Past Medical History  Past Medical History:  Diagnosis Date  . Anxiety   . Asthma   . Chronic back pain   . COPD (chronic obstructive pulmonary disease) ([place])    on home O2  . Diabetes mellitus without complication ([place])   . HOH (hard of hearing)   . Hypertension   . Lumbar radiculopathy     Family History   family history includes Asthma in her sister; COPD in her mother; Cancer in her mother; Stroke in her brother.  Prior Rehab/Hospitalizations Has the patient had prior rehab or  hospitalizations prior to admission? {Yes/No/Unknown:304600602}  Has the patient had major surgery during 100 days prior to admission? {Yes/No/Unknown:304600602}   Current Medications  Current Facility-Administered Medications:  .  0.9 %  sodium chloride infusion, , Intravenous, Continuous, Shahmehdi, Seyed A, MD .  acetaminophen (TYLENOL) tablet 650 mg, 650 mg, Oral, Q4H PRN **OR** acetaminophen (TYLENOL) 160 MG/5ML solution 650 mg, 650 mg, Per Tube, Q4H PRN **OR** acetaminophen (TYLENOL) suppository 650 mg, 650 mg, Rectal, Q4H PRN, Zierle-Ghosh, Asia B, DO .  albuterol (VENTOLIN HFA) 108 (90 Base) MCG/ACT inhaler 1-2 puff, 1-2 puff, Inhalation, Q4H PRN, Zierle-Ghosh, Asia B, DO .  amoxicillin-clavulanate (AUGMENTIN) 875-125 MG per tablet 1 tablet, 1 tablet, Oral, Q12H, Zierle-Ghosh, Asia B, DO, 1 tablet at 04/08/21 0932 .  aspirin tablet 325 mg, 325 mg, Oral, Daily, Shahmehdi, Seyed A, MD, 325 mg at 04/08/21 0932 .  atorvastatin (LIPITOR) tablet 80 mg, 80 mg, Oral, Daily, Shahmehdi, Seyed A, MD, 80 mg at 04/08/21 0932 .  chlorthalidone (HYGROTON) tablet 25 mg, 25 mg, Oral, Daily, Zierle-Ghosh, Asia B, DO, 25 mg at 04/08/21 0932 .  cyclobenzaprine (FLEXERIL) tablet 5  mg, 5 mg, Oral, TID PRN, Zierle-Ghosh, Asia B, DO, 5 mg at 04/08/21 0114 .  [START ON 04/09/2021] diltiazem (CARDIZEM) tablet 60 mg, 60 mg, Oral, BID, Shahmehdi, Seyed A, MD .  famotidine (PEPCID) tablet 20 mg, 20 mg, Oral, Daily, Zierle-Ghosh, Asia B, DO, 20 mg at 04/08/21 0932 .  gabapentin (NEURONTIN) capsule 400 mg, 400 mg, Oral, TID, Zierle-Ghosh, Asia B, DO, 400 mg at 04/08/21 0932 .  heparin injection 5,000 Units, 5,000 Units, Subcutaneous, Q8H, Zierle-Ghosh, Asia B, DO, 5,000 Units at 04/08/21 1420 .  insulin aspart (novoLOG) injection 0-15 Units, 0-15 Units, Subcutaneous, TID WC, Zierle-Ghosh, Asia B, DO, 3 Units at 04/08/21 1205 .  insulin aspart (novoLOG) injection 0-5 Units, 0-5 Units, Subcutaneous, QHS, Zierle-Ghosh,  Asia B, DO .  iohexol (OMNIPAQUE) 9 MG/ML oral solution, , , ,  .  [START ON 04/09/2021] metoprolol tartrate (LOPRESSOR) tablet 100 mg, 100 mg, Oral, BID, Shahmehdi, Seyed A, MD .  mometasone-formoterol (DULERA) 200-5 MCG/ACT inhaler 2 puff, 2 puff, Inhalation, BID, Zierle-Ghosh, Asia B, DO, 2 puff at 04/08/21 0745 .  nicotine (NICODERM CQ - dosed in mg/24 hours) patch 21 mg, 21 mg, Transdermal, Daily, Zierle-Ghosh, Asia B, DO, 21 mg at 04/08/21 0932 .  ondansetron (ZOFRAN) injection 4 mg, 4 mg, Intravenous, Q6H PRN, Zierle-Ghosh, Asia B, DO .  oxyCODONE (Oxy IR/ROXICODONE) immediate release tablet 5 mg, 5 mg, Oral, Q6H PRN, Shahmehdi, Seyed A, MD .  umeclidinium bromide (INCRUSE ELLIPTA) 62.5 MCG/INH 1 puff, 1 puff, Inhalation, Daily, Zierle-Ghosh, Asia B, DO, 1 puff at 04/08/21 0745  Patients Current Diet:  Diet Order            Diet Carb Modified Fluid consistency: Thin; Room service appropriate? Yes  Diet effective now                 Precautions / Restrictions Precautions Precautions: Fall Restrictions Weight Bearing Restrictions: No   Has the patient had 2 or more falls or a fall with injury in the past year? {Yes/No/Unknown:304600602}  Prior Activity Level Limited Community (1-2x/wk): Mod I with RW, assisted with adls  Prior Functional Level Self Care: Did the patient need help bathing, dressing, using the toilet or eating? {Prior Functional QPYPP:509326712}  Indoor Mobility: Did the patient need assistance with walking from room to room (with or without device)? {Prior Functional WPYKD:983382505}  Stairs: Did the patient need assistance with internal or external stairs (with or without device)? {Prior Functional LZJQB:341937902}  Functional Cognition: Did the patient need help planning regular tasks such as shopping or remembering to take medications? {Prior Functional IOXBD:532992426}  Home Assistive Devices / Equipment Home Assistive Devices/Equipment: Dentures  (specify type),Walker (specify type) Home Equipment: Walker - 4 wheels,Wheelchair - manual,Cane - single point,Bedside commode  Prior Device Use: Indicate devices/aids used by the patient prior to current illness, exacerbation or injury? {Prior Device STMH:962229798}  Current Functional Level Cognition  Arousal/Alertness: Suspect due to medications Overall Cognitive Status: Impaired/Different from baseline Orientation Level: Oriented X4 Attention: Sustained Sustained Attention: Impaired Sustained Attention Impairment: Verbal complex Memory: Impaired Memory Impairment: Retrieval deficit (suspect reduced due to medication) Awareness: Appears intact Problem Solving: Appears intact Safety/Judgment: Appears intact Comments: Pt given Ativan earlier today, which is likely impacting alertness currently    Extremity Assessment (includes Sensation/Coordination)  Upper Extremity Assessment: Defer to OT evaluation RUE Deficits / Details: Presenting with R side hemiplegia. 2+/5 shoulder MMT. [place]ow flexion A/ROM ~110*. 2+/5 grip strength. Unable to complete composite digit extension. P/ROM WFL grossly. RUE Coordination:  decreased fine motor  Lower Extremity Assessment: RLE deficits/detail RLE Deficits / Details: grossly -4/5 RLE Sensation: WNL RLE Coordination: WNL    ADLs  Overall ADL's : Needs assistance/impaired Eating/Feeding: Minimal assistance,Sitting Grooming: Minimal assistance,Sitting Upper Body Bathing: Minimal assistance,Sitting Lower Body Bathing: Moderate assistance,Minimal assistance,Sitting/lateral leans Upper Body Dressing : Minimal assistance,Sitting Lower Body Dressing: Total assistance,Sitting/lateral leans Lower Body Dressing Details (indicate cue type and reason): doning socks seated at EOB Toilet Transfer: Moderate assistance,Stand-pivot,RW Toilet Transfer Details (indicate cue type and reason): Simulated via stand pivot/ambulatory transfer using RW from EOB to  chair. Toileting- Clothing Manipulation and Hygiene: Minimal assistance,Moderate assistance,Sitting/lateral lean Tub/ Banker: Moderate assistance,Maximal assistance,Stand-pivot Functional mobility during ADLs: Moderate assistance,Maximal assistance,Rolling walker General ADL Comments: Other then lower body dressing, ADL status completed via clinical judgment based on performance during evaluation with transfers and ROM/strength testing.    Mobility  Overal bed mobility: Needs Assistance Bed Mobility: Supine to Sit Supine to sit: Supervision,Min guard General bed mobility comments: slow labored movement    Transfers  Overall transfer level: Needs assistance Equipment used: Rolling walker (2 wheeled),None,1 person hand held assist Transfers: Sit to/from [employer] Sit to Stand: Min assist Stand pivot transfers: Mod assist General transfer comment: very unsteady on feet having to lean on armrest of chair during transfer without AD, patient unable to grip RW with right hand when using RW    Ambulation / Gait / Stairs / Wheelchair Mobility  Ambulation/Gait Ambulation/Gait assistance: Mod assist,Max assist Gait Distance (Feet): 10 Feet Assistive device: Rolling walker (2 wheeled) Gait Pattern/deviations: Decreased step length - right,Decreased step length - left,Decreased stride length,Ataxic,Scissoring General Gait Details: patient very unsteady on feet with incoordination of RLE when taking steps and scissoring of legs during turns, unable to maintain grip on RW with right hand requiring tactile assistance to hold right hand onto RW Gait velocity: decreased    Posture / Balance Dynamic Sitting Balance Sitting balance - Comments: fair/good seated at EOB Balance Overall balance assessment: Needs assistance Sitting-balance support: Feet supported,No upper extremity supported Sitting balance-Leahy Scale: Fair Sitting balance - Comments: fair/good seated at  EOB Standing balance support: During functional activity,No upper extremity supported Standing balance-Leahy Scale: Poor Standing balance comment: fair/poor using RW    Special needs/care consideration {Special Care Needs/Care Considerations:304600603}   Previous Home Environment (from acute therapy documentation) Living Arrangements:  (lives with 2 adult children and adult grandchildren)  Lives With: Family Available Help at Discharge: Family,Available 24 hours/day (daughter states 24/7 assist can be arranged) Type of Home: Mobile home Home Layout: One level Home Access: Stairs to enter Entrance Stairs-Rails: Can reach both,Right,Left Entrance Stairs-Number of Steps: 8 Bathroom Shower/Tub: Chiropodist: Handicapped height Bathroom Accessibility: Yes Home Care Services: Yes Type of Home Care Services: Other (Comment) (transportation services)  Discharge Living Setting Plans for Discharge Living Setting: Patient's home,Mobile Home,Lives with (comment) (lives with children and grandchildren) Type of Home at Discharge: Mobile home Discharge Home Layout: One level Discharge Home Access: Stairs to enter Entrance Stairs-Rails: [place] reach both Entrance Stairs-Number of Steps: 8 Discharge Bathroom Shower/Tub: Tub/shower unit Discharge Bathroom Toilet: Handicapped height Discharge Bathroom Accessibility: Yes How Accessible: Accessible via walker Does the patient have any problems obtaining your medications?: No  Social/Family/Support Systems Contact Information: [name], daughter is main contact Anticipated Caregiver: children and adult grandchildren Anticipated Caregiver's Contact Information: see above Ability/Limitations of Caregiver: family will arrange 24/7 assist Caregiver Availability: 24/7 Discharge Plan Discussed with Primary Caregiver: Yes Is Caregiver In Agreement  with Plan?: Yes Does Caregiver/Family have Issues with Lodging/Transportation  while Pt is in Rehab?: No  Goals Patient/Family Goal for Rehab: supervision to min asisst with PT, OT and SLP Expected length of stay: [facility]OS 2 weeks Pt/Family Agrees to Admission and willing to participate: Yes Program Orientation Provided & Reviewed with Pt/Caregiver Including Roles  & Responsibilities: Yes  Decrease burden of Care through IP rehab admission: {Inpatient Rehab Care:20780}  Possible need for SNF placement upon discharge: ***  Patient Condition: {PATIENT'S CONDITION:22832}  Preadmission Screen Completed By:  Cleatrice Burke, 04/08/2021 4:31 PM ______________________________________________________________________   Discussed status with Dr. Marland Kitchen on *** at *** and received approval for admission today.  Admission Coordinator:  Cleatrice Burke, RN, time Marland KitchenSudie Grumbling ***   Assessment/Plan: Diagnosis: 1. Does the need for close, 24 hr/day Medical supervision in concert with the patient's rehab needs make it unreasonable for this patient to be served in a less intensive setting? {yes_no_potentially:3041433} 2. Co-Morbidities requiring supervision/potential complications: *** 3. Due to {due CH:8527782}, does the patient require 24 hr/day rehab nursing? {yes_no_potentially:3041433} 4. Does the patient require coordinated care of a physician, rehab nurse, PT, OT, and SLP to address physical and functional deficits in the context of the above medical diagnosis(es)? {yes_no_potentially:3041433} Addressing deficits in the following areas: {deficits:3041436} 5. Can the patient actively participate in an intensive therapy program of at least 3 hrs of therapy 5 days a week? {yes_no_potentially:3041433} 6. The potential for patient to make measurable gains while on inpatient rehab is {potential:3041437} 7. Anticipated functional outcomes upon discharge from inpatient rehab: {functional outcomes:304600100} PT, {functional outcomes:304600100} OT, {functional outcomes:304600100}  SLP 8. Estimated rehab length of stay to reach the above functional goals is: *** 9. Anticipated discharge destination: {anticipated dc setting:21604} 10. Overall Rehab/Functional Prognosis: {potential:3041437}   MD Signature: ***

## 2021-04-08 NOTE — Consult Note (Signed)
[facility] Consultation Oncology  Name: Tammy Blair      MRN: [id number]    Location: A327/A327-01  Date: 04/08/2021 Time:5:42 PM   REFERRING PHYSICIAN: Dr. Roger Shelter  REASON FOR CONSULT: Metastatic brain lesions   DIAGNOSIS: Metastatic lung cancer to the brain  HISTORY OF PRESENT ILLNESS: Tammy Blair is a 70 year old white female who is seen in consultation today for further work-up and management of metastatic brain lesions.  She came in with 3-week history of right upper extremity weakness to the ER.  CT of the head showed a left parietal infarct.  MR brain without contrast showed multiple intracranial mass lesions with surrounding vasogenic edema compatible with metastatic disease.  A chest x-ray on 04/07/2021 showed right lung mass.  She is current active smoker.  She is hard of hearing.  History was obtained from the patient, chart and talking to her daughter Tammy Blair.  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  Family history significant for father who died of bone cancer, sister who died of leukemia and paternal cousin died of metastatic lung cancer.  PAST MEDICAL HISTORY:   Past Medical History:  Diagnosis Date  . Anxiety   . Asthma   . Chronic back pain   . COPD (chronic obstructive pulmonary disease) ([place])    on home O2  . Diabetes mellitus without complication ([place])   . HOH (hard of hearing)   . Hypertension   . Lumbar radiculopathy     ALLERGIES: No Known Allergies    MEDICATIONS: I have reviewed the patient's current medications.     PAST SURGICAL HISTORY Past Surgical History:  Procedure Laterality Date  . CESAREAN SECTION    . TONSILLECTOMY      FAMILY HISTORY: Family History  Problem Relation Age of Onset  . COPD Mother   . Cancer Mother        kidney  . Stroke Brother   . Asthma Sister     SOCIAL HISTORY:  reports that she has been smoking cigarettes. She has a 20.00 pack-year smoking history. She has never used smokeless  tobacco. She reports previous drug use. She reports that she does not drink alcohol.  PERFORMANCE STATUS: The patient's performance status is 3 - Symptomatic, >50% confined to bed  PHYSICAL EXAM: Most Recent Vital Signs: Blood pressure 109/90, pulse 61, temperature 98 F (36.7 C), temperature source Oral, resp. rate 16, height 5\' 9"  (1.753 m), weight 201 lb 15.1 oz (91.6 kg), SpO2 99 %. BP 109/90 (BP Location: Left Arm)   Pulse 61   Temp 98 F (36.7 C) (Oral)   Resp 16   Ht 5\' 9"  (1.753 m)   Wt 201 lb 15.1 oz (91.6 kg)   SpO2 99%   BMI 29.82 kg/m  Lungs: clear to auscultation bilaterally Heart: regular rate and rhythm Abdomen: soft, non-tender; bowel sounds normal; no masses,  no organomegaly Extremities: Right upper extremity strength severely decreased.  No edema or cyanosis. Skin: No ecchymosis or other rashes. Neurologic: Grossly normal  LABORATORY DATA:  Results for orders placed or performed during the hospital encounter of 04/07/21 (from the past 48 hour(s))  Comprehensive metabolic panel     Status: Abnormal   Collection Time: 04/07/21  9:07 PM  Result Value Ref Range   Sodium 134 (L) 135 - 145 mmol/L   Potassium 3.3 (L) 3.5 - 5.1 mmol/L   Chloride 90 (L) 98 - 111 mmol/L   CO2 35 (H) 22 - 32  mmol/L   Glucose, Bld 111 (H) 70 - 99 mg/dL    Comment: Glucose reference range applies only to samples taken after fasting for at least 8 hours.   BUN 18 8 - 23 mg/dL   Creatinine, Ser 0.63 0.44 - 1.00 mg/dL   Calcium 9.3 8.9 - 10.3 mg/dL   Total Protein 8.2 (H) 6.5 - 8.1 g/dL   Albumin 3.9 3.5 - 5.0 g/dL   AST 27 15 - 41 U/L   ALT 28 0 - 44 U/L   Alkaline Phosphatase 58 38 - 126 U/L   Total Bilirubin 0.5 0.3 - 1.2 mg/dL   GFR, Estimated >60 >60 mL/min    Comment: (NOTE) Calculated using the CKD-EPI Creatinine Equation (2021)    Anion gap 9 5 - 15    Comment: Performed at [facility], [address] [place], [place] [address]  CBC with Differential     Status: Abnormal    Collection Time: 04/07/21  9:07 PM  Result Value Ref Range   WBC 12.5 (H) 4.0 - 10.5 K/uL   RBC 4.75 3.87 - 5.11 MIL/uL   Hemoglobin 15.0 12.0 - 15.0 g/dL   HCT 46.0 36.0 - 46.0 %   MCV 96.8 80.0 - 100.0 fL   MCH 31.6 26.0 - 34.0 pg   MCHC 32.6 30.0 - 36.0 g/dL   RDW 12.5 11.5 - 15.5 %   Platelets 301 150 - 400 K/uL   nRBC 0.0 0.0 - 0.2 %   Neutrophils Relative % 65 %   Neutro Abs 8.4 (H) 1.7 - 7.7 K/uL   Lymphocytes Relative 16 %   Lymphs Abs 2.0 0.7 - 4.0 K/uL   Monocytes Relative 9 %   Monocytes Absolute 1.1 (H) 0.1 - 1.0 K/uL   Eosinophils Relative 8 %   Eosinophils Absolute 1.0 (H) 0.0 - 0.5 K/uL   Basophils Relative 1 %   Basophils Absolute 0.1 0.0 - 0.1 K/uL   Immature Granulocytes 1 %   Abs Immature Granulocytes 0.06 0.00 - 0.07 K/uL    Comment: Performed at [facility], [address] [place], [place] [address]  Ethanol     Status: None   Collection Time: 04/07/21  9:07 PM  Result Value Ref Range   Alcohol, Ethyl (B) <10 <10 mg/dL    Comment: (NOTE) Lowest detectable limit for serum alcohol is 10 mg/dL.  For medical purposes only. Performed at [facility], [address], [place], [place] [address]   Resp Panel by RT-PCR (Flu A&B, Covid) Nasopharyngeal Swab     Status: None   Collection Time: 04/07/21 11:02 PM   Specimen: Nasopharyngeal Swab; Nasopharyngeal(NP) swabs in vial transport medium  Result Value Ref Range   SARS Coronavirus 2 by RT PCR NEGATIVE NEGATIVE    Comment: (NOTE) SARS-CoV-2 target nucleic acids are NOT DETECTED.  The SARS-CoV-2 RNA is generally detectable in upper respiratory specimens during the acute phase of infection. The lowest concentration of SARS-CoV-2 viral copies this assay can detect is 138 copies/mL. A negative result does not preclude SARS-Cov-2 infection and should not be used as the sole basis for treatment or other patient management decisions. A negative result may occur with  improper specimen collection/handling,  submission of specimen other than nasopharyngeal swab, presence of viral mutation(s) within the areas targeted by this assay, and inadequate number of viral copies(<138 copies/mL). A negative result must be combined with clinical observations, patient history, and epidemiological information. The expected result is Negative.  Fact Sheet for  Patients:  EntrepreneurPulse.com.au  Fact Sheet for Healthcare Providers:  IncredibleEmployment.be  This test is no t yet approved or cleared by the [place] FDA and  has been authorized for detection and/or diagnosis of SARS-CoV-2 by FDA under an Emergency Use Authorization (EUA). This EUA will remain  in effect (meaning this test can be used) for the duration of the COVID-19 declaration under Section 564(b)(1) of the Act, 21 U.S.C.section 360bbb-3(b)(1), unless the authorization is terminated  or revoked sooner.       Influenza A by PCR NEGATIVE NEGATIVE   Influenza B by PCR NEGATIVE NEGATIVE    Comment: (NOTE) The Xpert Xpress SARS-CoV-2/FLU/RSV plus assay is intended as an aid in the diagnosis of influenza from Nasopharyngeal swab specimens and should not be used as a sole basis for treatment. Nasal washings and aspirates are unacceptable for Xpert Xpress SARS-CoV-2/FLU/RSV testing.  Fact Sheet for Patients: EntrepreneurPulse.com.au  Fact Sheet for Healthcare Providers: IncredibleEmployment.be  This test is not yet approved or cleared by the [place] FDA and has been authorized for detection and/or diagnosis of SARS-CoV-2 by FDA under an Emergency Use Authorization (EUA). This EUA will remain in effect (meaning this test can be used) for the duration of the COVID-19 declaration under Section 564(b)(1) of the Act, 21 U.S.C. section 360bbb-3(b)(1), unless the authorization is terminated or revoked.  Performed at [facility], [address],  [place], [place] [address]   Glucose, capillary     Status: Abnormal   Collection Time: 04/08/21  1:06 AM  Result Value Ref Range   Glucose-Capillary 117 (H) 70 - 99 mg/dL    Comment: Glucose reference range applies only to samples taken after fasting for at least 8 hours.  HIV Antibody (routine testing w rflx)     Status: None   Collection Time: 04/08/21  5:24 AM  Result Value Ref Range   HIV Screen 4th Generation wRfx Non Reactive Non Reactive    Comment: Performed at [facility], [place] [address] [place], [place] [address]  Hemoglobin A1c     Status: Abnormal   Collection Time: 04/08/21  5:24 AM  Result Value Ref Range   Hgb A1c MFr Bld 6.3 (H) 4.8 - 5.6 %    Comment: (NOTE) Pre diabetes:          5.7%-6.4%  Diabetes:              >6.4%  Glycemic control for   <7.0% adults with diabetes    Mean Plasma Glucose 134.11 mg/dL    Comment: Performed at [facility] [address], [place], [place] [address]  Lipid panel     Status: None   Collection Time: 04/08/21  5:24 AM  Result Value Ref Range   Cholesterol 126 0 - 200 mg/dL   Triglycerides 93 <150 mg/dL   HDL 41 >40 mg/dL   Total CHOL/HDL Ratio 3.1 RATIO   VLDL 19 0 - 40 mg/dL   LDL Cholesterol 66 0 - 99 mg/dL    Comment:        Total Cholesterol/HDL:CHD Risk Coronary Heart Disease Risk Table                     Men   Women  1/2 Average Risk   3.4   3.3  Average Risk       5.0   4.4  2 X Average Risk   9.6   7.1  3 X Average Risk  23.4   11.0        Use the calculated Patient Ratio above and the CHD Risk Table to determine the patient's CHD Risk.        ATP III CLASSIFICATION (LDL):  <100     mg/dL   Optimal  100-129  mg/dL   Near or Above                    Optimal  130-159  mg/dL   Borderline  160-189  mg/dL   High  >190     mg/dL   Very High Performed at [facility] [place], [place] [address]   Glucose, capillary     Status: Abnormal   Collection Time: 04/08/21  7:33 AM  Result  Value Ref Range   Glucose-Capillary 105 (H) 70 - 99 mg/dL    Comment: Glucose reference range applies only to samples taken after fasting for at least 8 hours.  TSH     Status: Abnormal   Collection Time: 04/08/21  8:16 AM  Result Value Ref Range   TSH 6.632 (H) 0.350 - 4.500 uIU/mL    Comment: Performed by a 3rd Generation assay with a functional sensitivity of <=0.01 uIU/mL. Performed at [facility], [address], [place], [place] [address]   Vitamin B12     Status: None   Collection Time: 04/08/21  8:16 AM  Result Value Ref Range   Vitamin B-12 481 180 - 914 pg/mL    Comment: (NOTE) This assay is not validated for testing neonatal or myeloproliferative syndrome specimens for Vitamin B12 levels. Performed at [facility], [address], [place], [place] [address]   Glucose, capillary     Status: Abnormal   Collection Time: 04/08/21 11:09 AM  Result Value Ref Range   Glucose-Capillary 127 (H) 70 - 99 mg/dL    Comment: Glucose reference range applies only to samples taken after fasting for at least 8 hours.  Glucose, capillary     Status: Abnormal   Collection Time: 04/08/21  4:00 PM  Result Value Ref Range   Glucose-Capillary 245 (H) 70 - 99 mg/dL    Comment: Glucose reference range applies only to samples taken after fasting for at least 8 hours.      RADIOGRAPHY: CT Head Wo Contrast  Result Date: 04/07/2021 CLINICAL DATA:  Right arm weakness for the past week. EXAM: CT HEAD WITHOUT CONTRAST TECHNIQUE: Contiguous axial images were obtained from the base of the skull through the vertex without intravenous contrast. COMPARISON:  Head CT 10/07/2017 FINDINGS: Brain: Moderately large area of low-density in the subcortical white matter of the left frontal lobe suspicious for subacute infarct. Small area of subcortical low-density involving the posterior right frontal lobe may also represent subacute ischemia. No hemorrhage. Findings are new from 2018 exam. No hydrocephalus or midline  shift. No subdural or extra-axial collection. Vascular: There is high-density within both the left and right MCAs, however this is more prominent on the left. Skull: No fracture or focal lesion. Sinuses/Orbits: Left maxillary sinus mucosal thickening with bubbly debris and fluid level. Prior left mastoidectomy. Opacification of right mastoid air cells. Other: None. IMPRESSION: 1. Moderately large area of low-density in the subcortical white matter of the left frontal lobe suspicious for subacute infarct. Small area of subcortical low-density involving the posterior right frontal lobe may also represent subacute ischemia. Findings are new from 2018 exam. Consider MRI for confirmation. 2. High-density within both the left and right MCAs,  however this is more prominent on the left. CTA or MRA recommended. 3. Left maxillary sinus mucosal thickening with bubbly debris and fluid level, can be seen with acute sinusitis. Electronically Signed   By: Keith Rake M.D.   On: 04/07/2021 21:30   MR ANGIO HEAD WO CONTRAST  Result Date: 04/08/2021 CLINICAL DATA:  Acute neuro deficit. Right arm weakness. Lung mass on chest x-ray EXAM: MRI HEAD WITHOUT CONTRAST MRA HEAD WITHOUT CONTRAST TECHNIQUE: Multiplanar, multiecho pulse sequences of the brain and surrounding structures were obtained without intravenous contrast. Angiographic images of the head were obtained using MRA technique without contrast. COMPARISON:  CT head 04/07/2021 FINDINGS: MRI HEAD FINDINGS Brain: Negative for acute infarct. Moderately large area of vasogenic edema in the left frontal parietal lobe as noted on CT. There is an associated 15 mm mass in the left precentral gyrus. Second area vasogenic edema is smaller in the right parietal lobe also with a associated small mass lesion. Mass is best seen on FLAIR and diffusion weighted imaging. Additional small areas of edema in the right frontal lobe and right pericallosal white matter also suspicious for  tumor. Separate areas of vasogenic edema in the right parietal lobe and right occipital lobe with associated small mass lesions. Small areas of FLAIR hyperintensity in the cerebellum bilaterally suspicious for metastatic disease. No acute hemorrhage.  Ventricle size normal.  No midline shift. Vascular: Normal arterial flow voids. Skull and upper cervical spine: No suspicious skeletal lesions. Sinuses/Orbits: Extensive mucosal edema and air-fluid level left maxillary sinus. Negative orbit. Bilateral mastoid effusion Other: None MRA HEAD FINDINGS Right vertebral artery dominant and widely patent. Left vertebral artery is hypoplastic distally but does contribute to the basilar. PICA patent bilaterally. Atherosclerotic irregularity in the basilar with moderate stenosis in the distal basilar. Posterior cerebral arteries patent bilaterally without significant stenosis Atherosclerotic irregularity and stenosis in the cavernous carotid bilaterally. Anterior and middle cerebral arteries patent bilaterally without stenosis or large vessel occlusion. Negative for cerebral aneurysm. IMPRESSION: 1. Negative for acute infarct 2. Multiple intracranial mass lesions with surrounding vasogenic edema compatible with metastatic disease. Lung mass noted on chest x-ray 04/07/2021. Recommend follow-up MRI brain with contrast to evaluate metastatic disease 3. Intracranial atherosclerotic disease especially the basilar and posterior cerebral arteries bilaterally. Negative for large vessel occlusion. Electronically Signed   By: Franchot Gallo M.D.   On: 04/08/2021 13:15   MR BRAIN WO CONTRAST  Result Date: 04/08/2021 CLINICAL DATA:  Acute neuro deficit. Right arm weakness. Lung mass on chest x-ray EXAM: MRI HEAD WITHOUT CONTRAST MRA HEAD WITHOUT CONTRAST TECHNIQUE: Multiplanar, multiecho pulse sequences of the brain and surrounding structures were obtained without intravenous contrast. Angiographic images of the head were obtained using  MRA technique without contrast. COMPARISON:  CT head 04/07/2021 FINDINGS: MRI HEAD FINDINGS Brain: Negative for acute infarct. Moderately large area of vasogenic edema in the left frontal parietal lobe as noted on CT. There is an associated 15 mm mass in the left precentral gyrus. Second area vasogenic edema is smaller in the right parietal lobe also with a associated small mass lesion. Mass is best seen on FLAIR and diffusion weighted imaging. Additional small areas of edema in the right frontal lobe and right pericallosal white matter also suspicious for tumor. Separate areas of vasogenic edema in the right parietal lobe and right occipital lobe with associated small mass lesions. Small areas of FLAIR hyperintensity in the cerebellum bilaterally suspicious for metastatic disease. No acute hemorrhage.  Ventricle size normal.  No  midline shift. Vascular: Normal arterial flow voids. Skull and upper cervical spine: No suspicious skeletal lesions. Sinuses/Orbits: Extensive mucosal edema and air-fluid level left maxillary sinus. Negative orbit. Bilateral mastoid effusion Other: None MRA HEAD FINDINGS Right vertebral artery dominant and widely patent. Left vertebral artery is hypoplastic distally but does contribute to the basilar. PICA patent bilaterally. Atherosclerotic irregularity in the basilar with moderate stenosis in the distal basilar. Posterior cerebral arteries patent bilaterally without significant stenosis Atherosclerotic irregularity and stenosis in the cavernous carotid bilaterally. Anterior and middle cerebral arteries patent bilaterally without stenosis or large vessel occlusion. Negative for cerebral aneurysm. IMPRESSION: 1. Negative for acute infarct 2. Multiple intracranial mass lesions with surrounding vasogenic edema compatible with metastatic disease. Lung mass noted on chest x-ray 04/07/2021. Recommend follow-up MRI brain with contrast to evaluate metastatic disease 3. Intracranial atherosclerotic  disease especially the basilar and posterior cerebral arteries bilaterally. Negative for large vessel occlusion. Electronically Signed   By: Franchot Gallo M.D.   On: 04/08/2021 13:15   DG CHEST PORT 1 VIEW  Result Date: 04/07/2021 CLINICAL DATA:  Cough EXAM: PORTABLE CHEST 1 VIEW COMPARISON:  09/27/2018 FINDINGS: The lungs are symmetrically well expanded. A 3.6 cm mass is developed within the right upper lobe. No pneumothorax or pleural effusion. Cardiac size within normal limits. Pulmonary vascularity is normal. No acute bone abnormality. IMPRESSION: Interval development of a 3.6 cm mass within the right upper lobe. Dedicated CT imaging is recommended for further evaluation. Electronically Signed   By: Fidela Salisbury MD   On: 04/07/2021 23:26       ASSESSMENT and PLAN:  1.  Presumed metastatic lung cancer to the brain: - Presentation with 3-week history of right upper extremity weakness. - MRI brain without contrast on 03/31/2021 with multiple intracranial mass lesions with surrounding vasogenic edema compatible with metastatic disease. - Patient current active smoker, pack per day for 50 years. - Reviewed images of the CT CAP with contrast done this evening which showed right lower lobe lung mass along with extensive mediastinal and subcarinal adenopathy. - Reviewed images of the brain MRI which showed at least 8-10 lesions. - She will need a tissue confirmation of her lung malignancy.  Please ask IR if they can do a biopsy of the right lung mass.  If not bronchoscopy and biopsy is an option. - I have discussed the plan with her daughter Tammy Blair over the phone.  2.  Brain metastasis: - Moderate vasogenic edema in the left frontal area with 1.5 cm lesion contributing to the right upper extremity paresis. - We will start her on dexamethasone 10 mg IV dose followed by dexamethasone 4 mg every 6 hours. - We will reach out to radiation oncology colleagues at [facility].  3.  COPD with chronic  respiratory failure: -She is on 2-3 L O2 by nasal cannula. - Continue Spiriva and Symbicort.  All questions were answered. The patient knows to call the clinic with any problems, questions or concerns. We can certainly see the patient much sooner if necessary.   Derek Jack

## 2021-04-08 NOTE — Progress Notes (Signed)
Inpatient Rehabilitation Admissions Coordinator    Inpatient rehab consult received. I contacted patient's daughter, [name] and sister in law, Vicente Males at patient bedside by phone. They are requesting to speak with her Hospital MD to discuss her medical needs before they feel able to discuss her rehab needs. I have alerted Dr. Roger Shelter and then I will follow up with patient and family concerning her rehab venue options,.  Danne Baxter, RN, MSN Rehab Admissions Coordinator [phone] 04/08/2021 2:40 PM

## 2021-04-08 NOTE — Plan of Care (Signed)
  Problem: Acute Rehab OT Goals (only OT should resolve) Goal: Pt. Will Perform Eating Flowsheets (Taken 04/08/2021 1220) Pt Will Perform Eating:  with set-up  sitting Goal: Pt. Will Perform Grooming Flowsheets (Taken 04/08/2021 1220) Pt Will Perform Grooming:  with set-up  sitting  with supervision  standing Goal: Pt. Will Perform Upper Body Bathing Flowsheets (Taken 04/08/2021 1220) Pt Will Perform Upper Body Bathing:  with set-up  with supervision  sitting  with adaptive equipment Goal: Pt. Will Perform Upper Body Dressing Flowsheets (Taken 04/08/2021 1220) Pt Will Perform Upper Body Dressing:  with supervision  with set-up  sitting  with adaptive equipment Goal: Pt. Will Perform Lower Body Dressing Flowsheets (Taken 04/08/2021 1220) Pt Will Perform Lower Body Dressing:  with min assist  sitting/lateral leans  with adaptive equipment Goal: Pt. Will Transfer To Toilet Flowsheets (Taken 04/08/2021 1220) Pt Will Transfer to Toilet:  with min assist  with min guard assist  ambulating  stand pivot transfer  grab bars Goal: Pt. Will Perform Toileting-Clothing Manipulation Flowsheets (Taken 04/08/2021 1220) Pt Will Perform Toileting - Clothing Manipulation and hygiene:  with min guard assist  sitting/lateral leans Goal: Pt/Caregiver Will Perform Home Exercise Program Flowsheets (Taken 04/08/2021 1220) Pt/caregiver will Perform Home Exercise Program:  Increased ROM  Increased strength  Right Upper extremity  With Supervision  Independently  Laurna Shetley OT, MOT

## 2021-04-08 NOTE — Evaluation (Signed)
Speech Language Pathology Evaluation Patient Details Name: Tammy Blair MRN: [id number] DOB: 12/01/1951 Today's Date: 04/08/2021 Time: 8413-2440 SLP Time Calculation (min) (ACUTE ONLY): 22 min  Problem List:  Patient Active Problem List   Diagnosis Date Noted  . Hypokalemia 04/08/2021  . Leukocytosis 04/08/2021  . CVA (cerebral vascular accident) ([place]) 04/07/2021  . Fever 09/17/2018  . Type 2 diabetes mellitus without complication ([place]) 10/08/2535  . COPD (chronic obstructive pulmonary disease) ([place]) 06/21/2018  . Acute metabolic encephalopathy 64/40/3474  . Acute and chronic respiratory failure with hypercapnia ([place])   . Sepsis secondary to UTI ([place]) 10/07/2017  . Tachycardia 10/07/2017  . Cocaine abuse ([place]) 10/07/2017  . Acute respiratory failure with hypercapnia ([place]) 08/11/2016  . Diabetes mellitus ([place]) 08/11/2016  . HOH (hard of hearing) 08/22/2013  . Hyperglycemia 08/21/2013  . COPD exacerbation ([place]) 02/18/2013  . GAD (generalized anxiety disorder) 02/18/2013  . Chronic pain syndrome 02/18/2013  . Tobacco use disorder 02/18/2013  . Chronic respiratory failure ([place]) 02/18/2013  . Obesity, unspecified 02/18/2013  . Essential hypertension 04/29/2009  . COPD UNSPECIFIED 04/29/2009  . WEIGHT GAIN, ABNORMAL 04/29/2009   Past Medical History:  Past Medical History:  Diagnosis Date  . Anxiety   . Asthma   . Chronic back pain   . COPD (chronic obstructive pulmonary disease) ([place])    on home O2  . Diabetes mellitus without complication ([place])   . HOH (hard of hearing)   . Hypertension   . Lumbar radiculopathy    Past Surgical History:  Past Surgical History:  Procedure Laterality Date  . CESAREAN SECTION    . TONSILLECTOMY     HPI:  Tammy Blair  is a 70 y.o. female, with history of lumbar radiculopathy, hypertension, diabetes mellitus without complication, COPD with chronic respiratory failure requiring 2.5 to 3 L nasal cannula continuously, anxiety and more presents  to the ED with a chief complaint of right arm weakness.  Patient initially told the ER that has been going on for 1 week, she tells me 2-3 weeks.  Sister at bedside thinks is closer to 3 weeks duration.  Patient reports that it was sudden in onset.  She has no associated pain.  She reports no numbness or paresthesias.  Patient reports no right leg weakness-but this is different on exam.  She denies dysphagia, difficulty speaking, chest pain, palpitations, change in vision, change in hearing.  She does report a fall between the bathtub and the commode.  She was trying to lean onto her weak arm, and it did not work, so she fell to the floor.  She denies hitting her head, denies loss of consciousness.  She does admit to low back pain after the fall.  Patient denies taking any pain medication for low back pain that she does not see a pain doctor.  Patient also admits to postnasal drip with cough productive of yellow sputum.  She is a current smoker.  Patient has no other complaints at this time. SLE requested. 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 Lung mass noted on chest  x-ray 04/07/2021.   Assessment / Plan / Recommendation Clinical Impression  SLP completed at bedside, however not fully completed due to variable alertness. Pt is very HOH, but was also given Ativan around 10AM (RN reports she was very lucid prior to this) in preparation for MRI. Pt was oriented to situation and time, however demonstrates deficits in attention and memory. She required frequent  repetition due to periods of lucidity and decreased alertness. Speech intelligibility is reduced, however suspect this is due to medication effect as well. Recommend f/u SLP services at next level of care to further determine if cognition is different from her baseline. Her sister was present for today's evaluation and thinks that Pt is "just tired".    SLP Assessment  SLP  Recommendation/Assessment: All further Speech Lanaguage Pathology  needs can be addressed in the next venue of care SLP Visit Diagnosis: Cognitive communication deficit (R41.841)    Follow Up Recommendations  Skilled Nursing facility (f/u SLP services at next level of care to determine if baseline)    Frequency and Duration           SLP Evaluation Cognition  Overall Cognitive Status: Impaired/Different from baseline Arousal/Alertness: Suspect due to medications Orientation Level: Oriented X4 Attention: Sustained Sustained Attention: Impaired Sustained Attention Impairment: Verbal complex Memory: Impaired Memory Impairment: Retrieval deficit (suspect reduced due to medication) Awareness: Appears intact Problem Solving: Appears intact Safety/Judgment: Appears intact Comments: Pt given Ativan earlier today, which is likely impacting alertness currently       Comprehension  Auditory Comprehension Overall Auditory Comprehension: Impaired Yes/No Questions: Within Functional Limits Commands: Impaired Two Step Basic Commands: 50-74% accurate Conversation: Simple Interfering Components: Hearing;Other (comment) (medication effect) EffectiveTechniques: Increased volume;Repetition Visual Recognition/Discrimination Discrimination: Within Function Limits Reading Comprehension Reading Status: Not tested    Expression Expression Primary Mode of Expression: Verbal Verbal Expression Overall Verbal Expression: Appears within functional limits for tasks assessed Initiation: No impairment Automatic Speech: Name;Social Response Level of Generative/Spontaneous Verbalization: Sentence Repetition: No impairment Naming: No impairment Pragmatics: No impairment Interfering Components: Attention Non-Verbal Means of Communication: Not applicable Written Expression Dominant Hand: Right Written Expression: Not tested   Oral / Motor  Oral Motor/Sensory Function Overall Oral Motor/Sensory  Function: Within functional limits Motor Speech Overall Motor Speech: Impaired Respiration: Impaired Level of Impairment: Sentence Phonation: Normal;Low vocal intensity Resonance: Within functional limits Articulation: Within functional limitis Intelligibility: Intelligibility reduced Motor Planning: Witnin functional limits Interfering Components:  (suspect due to medication effect) Effective Techniques: Increased vocal intensity   Thank you,  Genene Churn, [place]                     Nimrod Wendt 04/08/2021, 3:10 PM

## 2021-04-08 NOTE — Progress Notes (Addendum)
PROGRESS NOTE    Patient: Tammy Blair                            PCP: [name], MD                    DOB: March 02, 1951            DOA: 04/07/2021 OZH:[id number]             DOS: 04/08/2021, 7:16 AM   LOS: 0 days   Date of Service: The patient was seen and examined on 04/08/2021  Subjective:   The patient was seen and examined this morning. Stable at this time. Still complaining of : Right arm weakness Otherwise no issues overnight .  Brief Narrative:   Tammy Blair  is a 70 y.o.f w h/o lumbar radiculopathy, hypertension, diabetes mellitus without complication, COPD with chronic respiratory failure requiring 2.5 to 3 L nasal cannula continuously, anxiety and more presents to the ED with a chief complaint of right arm weakness for 1 week, then she stated that it has been going on for  2-3 weeks.  Sister at bedside thinks is closer to 3 weeks duration.  Patient reports that it was sudden in onset.  Patient reports of no paresthesia, no right leg weakness.   She denies dysphagia, difficulty speaking, chest pain, palpitations, change in vision, change in hearing.  She does report a fall between the bathtub and the commode.   She was trying to lean onto her weak arm, and it did not work, so she fell to the floor.  She denies hitting her head, denies loss of consciousness.    Patient reports that she does smoke but she is working on quitting.  She does not drink alcohol.  She does not use illicit drugs.  She reports she is not vaccinated for COVID.  She is full code.  In the ED Vitals were stable satting at 95% on 2 L Leukocytosis with a white blood cell count of 12.5, hemoglobin stable at 15.4 Hypokalemia at 3.3 CT head shows moderate to large area of low density in the left frontal lobe suspicious for subacute infarct. Shows high density within both left and right MCA and recommend CTA or MRA.  Also shows likely acute sinusitis Alcohol level is less than 10 EKG shows a heart rate of  67, QTC 426 with baseline wander  Subsequently admitted for stroke work-up    Assessment & Plan:   Principal Problem:   CVA (cerebral vascular accident) ([place]) Active Problems:   Tobacco use disorder   Type 2 diabetes mellitus without complication (HCC)   Hypokalemia   Leukocytosis    CVA 1. Acute versus subacute embolic stroke 2. Per CT findings: shows moderate to large area of low density in the left frontal lobe suspicious for subacute infarct. Shows high density within both left and right MCA  3. MRI/MRA  >>> 4. Echo : 5. Carotid ultrasound:  6. PT/ OT/ ST ; consulted for evaluation recommendations  7. Consult neuro 8. Will continue monitor on telemetry -looking for dysrhythmia 9. Allowing permissive hypertension, aspirin and high-dose statins 10. Follow-up labs  Status post fall 1. No noted acute trauma 2. Likely due to weakness from CVA,  3. Fall precaution, consulting PT/OT for evaluation   Hypokalemia 1. Replace and recheck 2. We will check magnesium  Tobacco use disorder 1. Extensive counseling on cessation - and risks  of smoking while on oxygen 2. Nicotine patch ordered  COPD with chronic respiratory failure - ? Asthma  1. Continue 2.5 - 3L nasal cannula O2 supplementation 2. Continue home meds, including Spiriva, Symbicort, 3. No signs of exacerbation, as needed DuoNeb  DMII 1. Hold metformin 2. We will check her blood sugar QA CHS, SSI coverage 3. Hgb A1C >>>   Chronic back pain   1.  As needed analgesics, PT OT  2.  Home medication includes Flexeril, naproxen, Neurontin, oxycodone  Hyperlipidemia   1.  Continue statins, maximizing dose for acute CVA  Hypertension   1.  But holding metformin for now  Anxiety  1.  Monitoring.  Home medication of Xanax  Sinusitis 1. Continue Augmentin 2. Leukocytosis 12.5 3. CXR w/o signs of infection 4. UA pending 5. Sinusitis on CT  head    --------------------------------------------------------------------------------------------------------------------------------------------- Nutritional status:  The patient's BMI is: Body mass index is 29.82 kg/m. I agree with the assessment and plan as outlined   --------------------------------------------------------------------------------------------------------------------------------------------- Cultures; None   Antimicrobials: None    Consultants: Neurologist   ------------------------------------------------------------------------------------------------------------------------------------------------  DVT prophylaxis:  SCD/Compression stockings and Heparin SQ Code Status:   Code Status: Full Code  Family Communication: Discussed with patient No family member present at bedside- attempt will be made to update daily The above findings and plan of care has been discussed with patient (and family)  in detail,  they expressed understanding and agreement of above. -Advance care planning has been discussed.   Admission status:   Status is: Observation  The patient remains OBS appropriate and will d/c before 2 midnights.  Dispo: The patient is from: Home              Anticipated d/c is to: PT OT recommending CIR-consulted  Likely will be ready for discharge in 1 to 2 days               Patient currently is not medically stable to d/c.  Pending stroke work-up.    Difficult to place patient No      Level of care: Telemetry   Procedures:   No admission procedures for hospital encounter.     Antimicrobials:  Anti-infectives (From admission, onward)   Start     Dose/Rate Route Frequency Ordered Stop   04/08/21 0145  amoxicillin-clavulanate (AUGMENTIN) 875-125 MG per tablet 1 tablet        1 tablet Oral Every 12 hours 04/08/21 0045 04/14/21 2159       Medication:  . amoxicillin-clavulanate  1 tablet Oral Q12H  . atorvastatin  10 mg Oral  Daily  . chlorthalidone  25 mg Oral Daily  . diltiazem  60 mg Oral BID  . famotidine  20 mg Oral Daily  . gabapentin  400 mg Oral TID  . heparin  5,000 Units Subcutaneous Q8H  . insulin aspart  0-15 Units Subcutaneous TID WC  . insulin aspart  0-5 Units Subcutaneous QHS  . metoprolol tartrate  100 mg Oral BID  . mometasone-formoterol  2 puff Inhalation BID  . nicotine  21 mg Transdermal Daily  . umeclidinium bromide  1 puff Inhalation Daily    acetaminophen **OR** acetaminophen (TYLENOL) oral liquid 160 mg/5 mL **OR** acetaminophen, albuterol, cyclobenzaprine, ondansetron (ZOFRAN) IV, oxyCODONE   Objective:   Vitals:   04/08/21 0011 04/08/21 0214 04/08/21 0401 04/08/21 0600  BP: (!) 153/72 (!) 154/64 (!) 140/55 127/81  Pulse: 72 65 70 68  Resp: 18 18  18   Temp: 97.7 F (  36.5 C) 98 F (36.7 C) 97.9 F (36.6 C) 98.1 F (36.7 C)  TempSrc: Oral Oral Oral Oral  SpO2: 96% 100% 96% 95%  Weight:      Height:        Intake/Output Summary (Last 24 hours) at 04/08/2021 6295 Last data filed at 04/08/2021 0143 Gross per 24 hour  Intake 240 ml  Output --  Net 240 ml   Filed Weights   04/07/21 1923  Weight: 91.6 kg     Examination:   Physical Exam  Constitution:  Alert, cooperative, no distress,  Appears calm and comfortable  Psychiatric: Normal and stable mood and affect, cognition intact,   HEENT: Normocephalic, PERRL, otherwise with in Normal limits  Chest:Chest symmetric Cardio vascular:  S1/S2, RRR, No murmure, No Rubs or Gallops  pulmonary: Clear to auscultation bilaterally, respirations unlabored, negative wheezes / crackles Abdomen: Soft, non-tender, non-distended, bowel sounds,no masses, no organomegaly Muscular skeletal:  Right arm weakness with strength of 1 / 5 Limited exam - in bed, able to move all 3 extremities, Normal strength,  Neuro: Right arm weakness, otherwise strength 5 out of 5 in all other extremities CNII-XII intact. ,  Intact sensation, reflexes  intact  Extremities: No pitting edema lower extremities, +2 pulses  Skin: Dry, warm to touch, negative for any Rashes, No open wounds Wounds: per nursing documentation    ------------------------------------------------------------------------------------------------------------------------------------------    LABs:  CBC Latest Ref Rng & Units 04/07/2021 09/18/2018 09/17/2018  WBC 4.0 - 10.5 K/uL 12.5(H) 17.9(H) 20.3(H)  Hemoglobin 12.0 - 15.0 g/dL 15.0 13.7 14.3  Hematocrit 36.0 - 46.0 % 46.0 42.3 43.7  Platelets 150 - 400 K/uL 301 245 268   CMP Latest Ref Rng & Units 04/07/2021 09/18/2018 09/17/2018  Glucose 70 - 99 mg/dL 111(H) 243(H) 139(H)  BUN 8 - 23 mg/dL 18 9 8   Creatinine 0.44 - 1.00 mg/dL 0.63 0.71 0.67  Sodium 135 - 145 mmol/L 134(L) 139 134(L)  Potassium 3.5 - 5.1 mmol/L 3.3(L) 4.6 4.0  Chloride 98 - 111 mmol/L 90(L) 98 92(L)  CO2 22 - 32 mmol/L 35(H) 33(H) 33(H)  Calcium 8.9 - 10.3 mg/dL 9.3 9.1 9.1  Total Protein 6.5 - 8.1 g/dL 8.2(H) 7.6 -  Total Bilirubin 0.3 - 1.2 mg/dL 0.5 0.5 -  Alkaline Phos 38 - 126 U/L 58 55 -  AST 15 - 41 U/L 27 22 -  ALT 0 - 44 U/L 28 30 -       Micro Results Recent Results (from the past 240 hour(s))  Resp Panel by RT-PCR (Flu A&B, Covid) Nasopharyngeal Swab     Status: None   Collection Time: 04/07/21 11:02 PM   Specimen: Nasopharyngeal Swab; Nasopharyngeal(NP) swabs in vial transport medium  Result Value Ref Range Status   SARS Coronavirus 2 by RT PCR NEGATIVE NEGATIVE Final    Comment: (NOTE) SARS-CoV-2 target nucleic acids are NOT DETECTED.  The SARS-CoV-2 RNA is generally detectable in upper respiratory specimens during the acute phase of infection. The lowest concentration of SARS-CoV-2 viral copies this assay can detect is 138 copies/mL. A negative result does not preclude SARS-Cov-2 infection and should not be used as the sole basis for treatment or other patient management decisions. A negative result may occur with   improper specimen collection/handling, submission of specimen other than nasopharyngeal swab, presence of viral mutation(s) within the areas targeted by this assay, and inadequate number of viral copies(<138 copies/mL). A negative result must be combined with clinical observations, patient history, and epidemiological  information. The expected result is Negative.  Fact Sheet for Patients:  EntrepreneurPulse.com.au  Fact Sheet for Healthcare Providers:  IncredibleEmployment.be  This test is no t yet approved or cleared by the [place] FDA and  has been authorized for detection and/or diagnosis of SARS-CoV-2 by FDA under an Emergency Use Authorization (EUA). This EUA will remain  in effect (meaning this test can be used) for the duration of the COVID-19 declaration under Section 564(b)(1) of the Act, 21 U.S.C.section 360bbb-3(b)(1), unless the authorization is terminated  or revoked sooner.       Influenza A by PCR NEGATIVE NEGATIVE Final   Influenza B by PCR NEGATIVE NEGATIVE Final    Comment: (NOTE) The Xpert Xpress SARS-CoV-2/FLU/RSV plus assay is intended as an aid in the diagnosis of influenza from Nasopharyngeal swab specimens and should not be used as a sole basis for treatment. Nasal washings and aspirates are unacceptable for Xpert Xpress SARS-CoV-2/FLU/RSV testing.  Fact Sheet for Patients: EntrepreneurPulse.com.au  Fact Sheet for Healthcare Providers: IncredibleEmployment.be  This test is not yet approved or cleared by the [place] FDA and has been authorized for detection and/or diagnosis of SARS-CoV-2 by FDA under an Emergency Use Authorization (EUA). This EUA will remain in effect (meaning this test can be used) for the duration of the COVID-19 declaration under Section 564(b)(1) of the Act, 21 U.S.C. section 360bbb-3(b)(1), unless the authorization is terminated  or revoked.  Performed at [facility], [address], [place], [place] [address]     Radiology Reports CT Head Wo Contrast  Result Date: 04/07/2021 CLINICAL DATA:  Right arm weakness for the past week. EXAM: CT HEAD WITHOUT CONTRAST TECHNIQUE: Contiguous axial images were obtained from the base of the skull through the vertex without intravenous contrast. COMPARISON:  Head CT 10/07/2017 FINDINGS: Brain: Moderately large area of low-density in the subcortical white matter of the left frontal lobe suspicious for subacute infarct. Small area of subcortical low-density involving the posterior right frontal lobe may also represent subacute ischemia. No hemorrhage. Findings are new from 2018 exam. No hydrocephalus or midline shift. No subdural or extra-axial collection. Vascular: There is high-density within both the left and right MCAs, however this is more prominent on the left. Skull: No fracture or focal lesion. Sinuses/Orbits: Left maxillary sinus mucosal thickening with bubbly debris and fluid level. Prior left mastoidectomy. Opacification of right mastoid air cells. Other: None. IMPRESSION: 1. Moderately large area of low-density in the subcortical white matter of the left frontal lobe suspicious for subacute infarct. Small area of subcortical low-density involving the posterior right frontal lobe may also represent subacute ischemia. Findings are new from 2018 exam. Consider MRI for confirmation. 2. High-density within both the left and right MCAs, however this is more prominent on the left. CTA or MRA recommended. 3. Left maxillary sinus mucosal thickening with bubbly debris and fluid level, can be seen with acute sinusitis. Electronically Signed   By: Keith Rake M.D.   On: 04/07/2021 21:30   DG CHEST PORT 1 VIEW  Result Date: 04/07/2021 CLINICAL DATA:  Cough EXAM: PORTABLE CHEST 1 VIEW COMPARISON:  09/27/2018 FINDINGS: The lungs are symmetrically well expanded. A 3.6 cm mass is developed  within the right upper lobe. No pneumothorax or pleural effusion. Cardiac size within normal limits. Pulmonary vascularity is normal. No acute bone abnormality. IMPRESSION: Interval development of a 3.6 cm mass within the right upper lobe. Dedicated CT imaging is recommended for further evaluation. Electronically Signed   By: Fidela Salisbury  MD   On: 04/07/2021 23:26    SIGNED: Deatra James, MD, FHM. [facility],  Pager (please use [email or web address] to page/text) Please use Epic Secure Chat for non-urgent communication (7AM-7PM)  If 7PM-7AM, please contact night-coverage [email or web address], 04/08/2021, 7:16 AM

## 2021-04-09 ENCOUNTER — Inpatient Hospital Stay (HOSPITAL_COMMUNITY): Payer: Medicare Other

## 2021-04-09 ENCOUNTER — Ambulatory Visit
Admit: 2021-04-09 | Discharge: 2021-04-09 | Disposition: A | Discharge status: Home / Self Care | Payer: Medicare Other | Attending: Radiation Oncology | Admitting: Radiation Oncology

## 2021-04-09 ENCOUNTER — Encounter (HOSPITAL_COMMUNITY): Payer: Self-pay | Admitting: Family Medicine

## 2021-04-09 ENCOUNTER — Telehealth: Payer: Self-pay | Admitting: Pulmonary Disease

## 2021-04-09 DIAGNOSIS — Z515 Encounter for palliative care: Secondary | ICD-10-CM

## 2021-04-09 DIAGNOSIS — R918 Other nonspecific abnormal finding of lung field: Secondary | ICD-10-CM

## 2021-04-09 DIAGNOSIS — I6389 Other cerebral infarction: Secondary | ICD-10-CM

## 2021-04-09 DIAGNOSIS — C7931 Secondary malignant neoplasm of brain: Secondary | ICD-10-CM | POA: Diagnosis not present

## 2021-04-09 DIAGNOSIS — I639 Cerebral infarction, unspecified: Secondary | ICD-10-CM | POA: Diagnosis not present

## 2021-04-09 DIAGNOSIS — C3411 Malignant neoplasm of upper lobe, right bronchus or lung: Secondary | ICD-10-CM | POA: Diagnosis not present

## 2021-04-09 DIAGNOSIS — Z7189 Other specified counseling: Secondary | ICD-10-CM | POA: Diagnosis not present

## 2021-04-09 DIAGNOSIS — R599 Enlarged lymph nodes, unspecified: Secondary | ICD-10-CM | POA: Insufficient documentation

## 2021-04-09 DIAGNOSIS — F17200 Nicotine dependence, unspecified, uncomplicated: Secondary | ICD-10-CM | POA: Diagnosis not present

## 2021-04-09 DIAGNOSIS — R531 Weakness: Secondary | ICD-10-CM | POA: Diagnosis not present

## 2021-04-09 LAB — ECHOCARDIOGRAM COMPLETE
Area-P 1/2: 3.91 cm2
Height: 69 in
S' Lateral: 2.41 cm
Weight: 3231.06 oz

## 2021-04-09 LAB — CEA: CEA: 48.5 ng/mL — ABNORMAL HIGH (ref 0.0–4.7)

## 2021-04-09 LAB — GLUCOSE, CAPILLARY
Glucose-Capillary: 340 mg/dL — ABNORMAL HIGH (ref 70–99)
Glucose-Capillary: 252 mg/dL — ABNORMAL HIGH (ref 70–99)
Glucose-Capillary: 208 mg/dL — ABNORMAL HIGH (ref 70–99)

## 2021-04-09 LAB — CANCER ANTIGEN 19-9: CA 19-9: 177 U/mL — ABNORMAL HIGH (ref 0–35)

## 2021-04-09 LAB — T4, FREE: Free T4: 1.04 ng/dL (ref 0.61–1.12)

## 2021-04-09 LAB — AFP TUMOR MARKER: AFP, Serum, Tumor Marker: 1.9 ng/mL (ref 0.0–9.2)

## 2021-04-09 MED ORDER — ASPIRIN EC 81 MG PO TBEC
81.0000 mg | DELAYED_RELEASE_TABLET | Freq: Every day | ORAL | Status: DC
  Administered 2021-04-10 – 2021-04-14 (×4): 81 mg via ORAL
  Filled 2021-04-09 (×4): qty 1

## 2021-04-09 NOTE — Progress Notes (Signed)
Inpatient Diabetes Program Recommendations  AACE/ADA: New Consensus Statement on Inpatient Glycemic Control   Target Ranges:  Prepandial:   less than 140 mg/dL      Peak postprandial:   less than 180 mg/dL (1-2 hours)      Critically ill patients:  140 - 180 mg/dL   Results for Tammy Blair, Tammy Blair (MRN [id number]) as of 04/09/2021 09:47  Ref. Range 04/08/2021 07:33 04/08/2021 11:09 04/08/2021 16:00 04/08/2021 21:30 04/09/2021 07:48  Glucose-Capillary Latest Ref Range: 70 - 99 mg/dL 105 (H) 127 (H) 245 (H) 197 (H) 340 (H)   Review of Glycemic Control  Diabetes history: DM2 Outpatient Diabetes medications: Metformin XR 500 mg BID Current orders for Inpatient glycemic control: Novolog 0-15 units TID with meals, Novolog 0-5 units QHS; Decadron 4mg  Q6H  Inpatient Diabetes Program Recommendations:    Insulin: If steroids are continued as ordered, please consider ordering Lantus 10 units Q24H. If patient is eating well and post prandial glucose is consistently over 180 mg/dl with addition of Lantus, may want to consider ordering Novolog 3 units TID with meals for meal coverage if patient eats at least 50% of meals.  Thanks, Barnie Alderman, RN, MSN, CDE Diabetes Coordinator Inpatient Diabetes Program [phone] (Team Pager from 8am to 5pm)

## 2021-04-09 NOTE — Progress Notes (Signed)
Physical Therapy Treatment Patient Details Name: Tammy Blair MRN: [id number] DOB: Dec 25, 1950 Today's Date: 04/09/2021    History of Present Illness Tammy Blair  is a 70 y.o. female, with history of lumbar radiculopathy, hypertension, diabetes mellitus without complication, COPD with chronic respiratory failure requiring 2.5 to 3 L nasal cannula continuously, anxiety and more presents to the ED with a chief complaint of right arm weakness.  Patient initially told the ER that has been going on for 1 week, she tells me 2-3 weeks.  Sister at bedside thinks is closer to 3 weeks duration.  Patient reports that it was sudden in onset.  She has no associated pain.  She reports no numbness or paresthesias.  Patient reports no right leg weakness-but this is different on exam.  She denies dysphagia, difficulty speaking, chest pain, palpitations, change in vision, change in hearing.  She does report a fall between the bathtub and the commode.  She was trying to lean onto her weak arm, and it did not work, so she fell to the floor.  She denies hitting her head, denies loss of consciousness.  She does admit to low back pain after the fall.  Patient denies taking any pain medication for low back pain that she does not see a pain doctor.  Patient also admits to postnasal drip with cough productive of yellow sputum.  She is a current smoker.  Patient has no other complaints at this time.    PT Comments    Patient agreeable to participating in PT today. Patient very hard of hearing so much of session PT utilized tactile and gestural cues. Patient stated she needed to go to the bathroom. Patient performed bed mobility, transfers and ambulation with min guard to min assist but required cues for use and placement of hemi-walker. Patient unsteady somewhat on her feet with not being familiar with using an assistive device. Right upper extremity flaccid at side throughout session. Upon returning to bed, patient agreeable to  performing therapeutic exercises seated at edge of bed. Significant other present at end of session.     Follow Up Recommendations  CIR     Equipment Recommendations  None recommended by PT    Recommendations for Other Services       Precautions / Restrictions Precautions Precautions: Fall Restrictions Weight Bearing Restrictions: No    Mobility  Bed Mobility Overal bed mobility: Needs Assistance Bed Mobility: Supine to Sit;Sit to Supine     Supine to sit: Min guard;Min assist Sit to supine: Min guard;Min assist   General bed mobility comments: increased time, assistance for bed sheets    Transfers Overall transfer level: Needs assistance Equipment used: Hemi-walker Transfers: Sit to/from [facility] Sit to Stand: Min guard;Min assist Stand pivot transfers: Min guard;Min assist       General transfer comment: Patient very hard of hearing - tactile and gestural cues for sequencing of steps and placement of left hand and hemi-walker  Ambulation/Gait Ambulation/Gait assistance: Min assist;Min guard Gait Distance (Feet): 20 Feet (10 feet x2) Assistive device: Hemi-walker Gait Pattern/deviations: Step-through pattern;Decreased step length - right;Decreased step length - left;Narrow base of support;Decreased stride length;Trunk flexed Gait velocity: decreased   General Gait Details: Patient very hard of hearing - tactile and gestural cues for sequencing of steps and placement of left hand and hemi-walker; slow, somewhat labored gait using hemi-walker on left with right upper extremity flaccid/hanging; 3 LPM O2, assistance for line management; limited by fatigue   Stairs  Wheelchair Mobility    Modified Rankin (Stroke Patients Only)       Balance Overall balance assessment: Needs assistance Sitting-balance support: Feet supported;No upper extremity supported Sitting balance-Leahy Scale: Fair Sitting balance - Comments:  fair/good seated at EOB   Standing balance support: During functional activity;No upper extremity supported Standing balance-Leahy Scale: Poor Standing balance comment: fair/poor using hemi-walker     Cognition Arousal/Alertness: Awake/alert Behavior During Therapy: WFL for tasks assessed/performed Overall Cognitive Status: Within Functional Limits for tasks assessed     Exercises General Exercises - Lower Extremity Long Arc Quad: AROM;Strengthening;Both;10 reps;Seated Hip Flexion/Marching: AROM;Strengthening;Both;10 reps;Seated Toe Raises: AROM;Strengthening;Both;10 reps;Seated Heel Raises: AROM;Strengthening;Both;10 reps;Seated    General Comments        Pertinent Vitals/Pain Pain Assessment: No/denies pain    Home Living        Prior Function            PT Goals (current goals can now be found in the care plan section) Acute Rehab PT Goals Patient Stated Goal: return home with assist PT Goal Formulation: With patient Time For Goal Achievement: 04/22/21 Potential to Achieve Goals: Good Progress towards PT goals: Progressing toward goals    Frequency    Min 5X/week      PT Plan Current plan remains appropriate       AM-PAC PT "6 Clicks" Mobility   Outcome Measure  Help needed turning from your back to your side while in a flat bed without using bedrails?: None Help needed moving from lying on your back to sitting on the side of a flat bed without using bedrails?: A Little Help needed moving to and from a bed to a chair (including a wheelchair)?: A Lot Help needed standing up from a chair using your arms (e.g., wheelchair or bedside chair)?: A Lot Help needed to walk in hospital room?: A Lot Help needed climbing 3-5 steps with a railing? : Total 6 Click Score: 14    End of Session Equipment Utilized During Treatment: Oxygen Activity Tolerance: Patient tolerated treatment well;Patient limited by fatigue Patient left: with call bell/phone within  reach;in bed;with nursing/sitter in room;with family/visitor present Nurse Communication: Mobility status PT Visit Diagnosis: Unsteadiness on feet (R26.81);Other abnormalities of gait and mobility (R26.89);Muscle weakness (generalized) (M62.81)     Time: 8250-5397 PT Time Calculation (min) (ACUTE ONLY): 25 min  Charges:  $Therapeutic Exercise: 8-22 mins $Therapeutic Activity: 8-22 mins                     Floria Raveling. Hartnett-Rands, MS, PT Per [place] [phone]  Pamala Hurry  Hartnett-Rands 04/09/2021, 12:25 PM

## 2021-04-09 NOTE — Progress Notes (Signed)
  Echocardiogram 2D Echocardiogram has been performed.  Tammy Blair 04/09/2021, 11:57 AM

## 2021-04-09 NOTE — Progress Notes (Signed)
Interventional Radiology Brief Note:  IR consulted for lung mass biopsy at the request of Dr. Roger Shelter.  Case was reviewed by Dr. Vernard Gambles who approved patient for US-guided subpectoral lymph node biopsy, however appears pulmonology was also consulted and has set patient up for bronchoscopy with possible biopsy at [facility] next week.   Will cancel order for image-guided biopsy with IR at this time.   Brynda Greathouse, MS RD PA-C 2:19 PM

## 2021-04-09 NOTE — Telephone Encounter (Addendum)
I have pt scheduled for 5/3 at 1:00 at [facility]. I did not schedule covid test due to her hospitalized now and if discharged she can self quarantine until Tuesday.  I called pt because was unaware if she was still going to be inpt on 5/3.  She answered and was in process of being transferred to [facility].  I told her doctor's would be discussing procedure that I scheduled for her for 5/3.

## 2021-04-09 NOTE — Telephone Encounter (Signed)
PCCM:  Patient is currently admitted to [facility]. I was contacted by Dr. [name] from Radiation Oncology to help facilitate tissue biopsy of advanced stage lung cancer.   I have communicated a plan with Dr. Roger Shelter from [facility].  Bronchoscopy to be scheduled on Tuesday next week 04/13/2021 [facility]  Orders have been placed.    Garner Nash, DO [facility] 04/09/2021 11:13 AM

## 2021-04-09 NOTE — Plan of Care (Signed)
  Problem: Education: Goal: Knowledge of disease or condition will improve Outcome: Progressing Goal: Knowledge of secondary prevention will improve Outcome: Progressing Goal: Knowledge of patient specific risk factors addressed and post discharge goals established will improve Outcome: Progressing Goal: Individualized Educational Video(s) Outcome: Progressing   Problem: Coping: Goal: Will verbalize positive feelings about self Outcome: Progressing Goal: Will identify appropriate support needs Outcome: Progressing   Problem: Health Behavior/Discharge Planning: Goal: Ability to manage health-related needs will improve Outcome: Progressing   Problem: Self-Care: Goal: Ability to participate in self-care as condition permits will improve Outcome: Progressing Goal: Verbalization of feelings and concerns over difficulty with self-care will improve Outcome: Progressing Goal: Ability to communicate needs accurately will improve Outcome: Progressing   Problem: Nutrition: Goal: Dietary intake will improve Outcome: Progressing   Problem: Ischemic Stroke/TIA Tissue Perfusion: Goal: Complications of ischemic stroke/TIA will be minimized Outcome: Progressing

## 2021-04-09 NOTE — Progress Notes (Signed)
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 Metastatic cancer to brain [facility])  C79.31     HISTORY OF PRESENT ILLNESS::Tammy Blair Daubert is a 70 y.o. female with 3-week history of right upper extremity weakness who presented to the ER.  CT of the head showed a left parietal infarct.  MR brain without contrast showed multiple intracranial mass lesions with surrounding vasogenic edema compatible with metastatic disease.  A chest x-ray on 04/07/2021 showed right lung mass.  She is current active smoker. MRI with contrast on 4/28 showed approximately 16 enhancing lesions are identified. The largest in the posterior left frontal lobe measures 1.5 cm involving the precentral gyrus.  CT Chest, abdomen and pelvis demonstrated 3.7 cm right upper lobe lung mass, with extensive thoracic and some lower neck adenopathy.  The patient was started on dexamethasone 4 mg every 6 hours last night and transferred to [facility] for possible brain radiotherapy.  She is likely to undergo bronchoscopy with Dr. Valeta Harms on 5/3 for biopsy  PREVIOUS RADIATION THERAPY: No  Past Medical History:  Diagnosis Date  . Anxiety   . Asthma   . Chronic back pain   . COPD (chronic obstructive pulmonary disease) ([place])    on home O2  . Diabetes mellitus without complication ([place])   . HOH (hard of hearing)   . Hypertension   . Lumbar radiculopathy   :  Past Surgical History:  Procedure Laterality Date  . CESAREAN SECTION    . TONSILLECTOMY    :  No current facility-administered medications for this encounter. No current outpatient medications on file.  Facility-Administered  Medications Ordered in Other Encounters:  .  acetaminophen (TYLENOL) tablet 650 mg, 650 mg, Oral, Q4H PRN **OR** acetaminophen (TYLENOL) 160 MG/5ML solution 650 mg, 650 mg, Per Tube, Q4H PRN **OR** acetaminophen (TYLENOL) suppository 650 mg, 650 mg, Rectal, Q4H PRN, Zierle-Ghosh, Asia B, DO .  albuterol (VENTOLIN HFA) 108 (90 Base) MCG/ACT inhaler 1-2 puff, 1-2 puff, Inhalation, Q4H PRN, Zierle-Ghosh, Asia B, DO .  amoxicillin-clavulanate (AUGMENTIN) 875-125 MG per tablet 1 tablet, 1 tablet, Oral, Q12H, Zierle-Ghosh, Asia B, DO, 1 tablet at 04/09/21 1012 .  [START ON 04/10/2021] aspirin EC tablet 81 mg, 81 mg, Oral, Daily, Shahmehdi, Seyed A, MD .  atorvastatin (LIPITOR) tablet 80 mg, 80 mg, Oral, Daily, Shahmehdi, Seyed A, MD, 80 mg at 04/09/21 1012 .  chlorthalidone (HYGROTON) tablet 25 mg, 25 mg, Oral, Daily, Zierle-Ghosh, Asia B, DO, 25 mg at 04/09/21 1012 .  cyclobenzaprine (FLEXERIL) tablet 5 mg, 5 mg, Oral, TID PRN, Zierle-Ghosh, Asia B, DO, 5 mg at 04/09/21 1016 .  dexamethasone (DECADRON) tablet 4 mg, 4 mg, Oral, Q6H, Derek Jack, MD, 4 mg at 04/09/21 1205 .  diltiazem (CARDIZEM) tablet 60 mg, 60 mg, Oral, BID, Shahmehdi, Seyed A, MD, 60 mg at 04/09/21 1012 .  famotidine (PEPCID) tablet 20 mg, 20 mg, Oral, Daily, Zierle-Ghosh, Asia B, DO, 20 mg at 04/09/21 1012 .  gabapentin (NEURONTIN) capsule 400 mg, 400 mg, Oral, TID, Zierle-Ghosh, Asia B, DO, 400 mg at 04/09/21 1012 .  heparin injection 5,000 Units, 5,000 Units, Subcutaneous, Q8H, Zierle-Ghosh, Asia B, DO, 5,000 Units  at 04/09/21 5329 .  insulin aspart (novoLOG) injection 0-15 Units, 0-15 Units, Subcutaneous, TID WC, Zierle-Ghosh, Asia B, DO, 8 Units at 04/09/21 1210 .  insulin aspart (novoLOG) injection 0-5 Units, 0-5 Units, Subcutaneous, QHS, Zierle-Ghosh, Asia B, DO .  metoprolol tartrate (LOPRESSOR) tablet 100 mg, 100 mg, Oral, BID, Shahmehdi, Seyed A, MD, 100 mg at 04/09/21 1012 .  mometasone-formoterol (DULERA) 200-5  MCG/ACT inhaler 2 puff, 2 puff, Inhalation, BID, Zierle-Ghosh, Asia B, DO, 2 puff at 04/09/21 0718 .  nicotine (NICODERM CQ - dosed in mg/24 hours) patch 21 mg, 21 mg, Transdermal, Daily, Zierle-Ghosh, Asia B, DO, 21 mg at 04/09/21 1010 .  ondansetron (ZOFRAN) injection 4 mg, 4 mg, Intravenous, Q6H PRN, Zierle-Ghosh, Asia B, DO .  oxyCODONE (Oxy IR/ROXICODONE) immediate release tablet 5 mg, 5 mg, Oral, Q6H PRN, Shahmehdi, Seyed A, MD, 5 mg at 04/09/21 1242 .  umeclidinium bromide (INCRUSE ELLIPTA) 62.5 MCG/INH 1 puff, 1 puff, Inhalation, Daily, Zierle-Ghosh, Asia B, DO, 1 puff at 04/09/21 0717:  No Known Allergies:  Family History  Problem Relation Age of Onset  . COPD Mother   . Cancer Mother        kidney  . Stroke Brother   . Asthma Sister   :  Social History   Socioeconomic History  . Marital status: Divorced    Spouse name: Not on file  . Number of children: Not on file  . Years of education: Not on file  . Highest education level: Not on file  Occupational History  . Not on file  Tobacco Use  . Smoking status: Current Every Day Smoker    Packs/day: 0.50    Years: 40.00    Pack years: 20.00    Types: Cigarettes  . Smokeless tobacco: Never Used  Vaping Use  . Vaping Use: Never used  Substance and Sexual Activity  . Alcohol use: No  . Drug use: Not Currently  . Sexual activity: Not Currently  Other Topics Concern  . Not on file  Social History Narrative  . Not on file   Social Determinants of Health   Financial Resource Strain: Not on file  Food Insecurity: Not on file  Transportation Needs: Not on file  Physical Activity: Not on file  Stress: Not on file  Social Connections: Not on file  Intimate Partner Violence: Not on file  :  REVIEW OF SYSTEMS:  A 15 point review of systems is documented in the electronic medical record. This was obtained by the nursing staff. However, I reviewed this with the patient to discuss relevant findings and make appropriate  changes.  Pertinent items are noted in HPI.   PHYSICAL EXAM:  There were no vitals taken for this visit. per hospitalist H&P:  General:  Patient sitting up in bed no acute distress Psychiatric:  Mood is anxious, behavior is normal, alert, oriented x3 Neurologic:  Face is symmetric, speech and language are normal, 3 out of 5 strength in the right upper extremity, 4 out of 5 strength in the right lower extremity, 5 out of 5 in the left upper and lower extremity, no sensory deficit, able to complete finger-to-nose and heel-to-shin with left extremities, but not with right HEENMT: Head is atraumatic, normocephalic, pupils are reactive to light, neck is supple, trachea is midline, mucous membranes are moist, extremely hard of hearing Respiratory :  Lungs are clear to auscultation bilaterally without wheezes, rhonchi, rales, no increased work of breathing, 2 L nasal cannula in place Cardiovascular :  Heart rate is normal, rhythm is regular, no murmurs rubs or gallops, no peripheral edema Gastrointestinal: Abdomen is soft, nondistended, nontender to palpation, no palpable masses, bowel sounds active Skin: Skin is warm dry and intact on limited exam, small Band-Aid on right lower extremity at the calf, bruising on right arm Musculoskeletal: No peripheral edema, no acute deformity, peripheral pulses palpated  KPS = 30  100 - Normal; no complaints; no evidence of disease. 90   - Able to carry on normal activity; minor signs or symptoms of disease. 80   - Normal activity with effort; some signs or symptoms of disease. 18   - Cares for self; unable to carry on normal activity or to do active work. 60   - Requires occasional assistance, but is able to care for most of his personal needs. 50   - Requires considerable assistance and frequent medical care. 53   - Disabled; requires special care and assistance. 26   - Severely disabled; hospital admission is indicated although death not imminent. 39   - Very  sick; hospital admission necessary; active supportive treatment necessary. 10   - Moribund; fatal processes progressing rapidly. 0     - Dead  Karnofsky DA, Abelmann Stuckey, Craver LS and Burchenal [facility] [phone]) The use of the nitrogen mustards in the palliative treatment of carcinoma: with particular reference to bronchogenic carcinoma Cancer 1 [id number]  LABORATORY DATA:  Lab Results  Component Value Date   WBC 12.5 (H) 04/07/2021   HGB 15.0 04/07/2021   HCT 46.0 04/07/2021   MCV 96.8 04/07/2021   PLT 301 04/07/2021   Lab Results  Component Value Date   NA 134 (L) 04/07/2021   K 3.3 (L) 04/07/2021   CL 90 (L) 04/07/2021   CO2 35 (H) 04/07/2021   Lab Results  Component Value Date   ALT 28 04/07/2021   AST 27 04/07/2021   ALKPHOS 58 04/07/2021   BILITOT 0.5 04/07/2021     RADIOGRAPHY: CT Head Wo Contrast  Result Date: 04/07/2021 CLINICAL DATA:  Right arm weakness for the past week. EXAM: CT HEAD WITHOUT CONTRAST TECHNIQUE: Contiguous axial images were obtained from the base of the skull through the vertex without intravenous contrast. COMPARISON:  Head CT 10/07/2017 FINDINGS: Brain: Moderately large area of low-density in the subcortical white matter of the left frontal lobe suspicious for subacute infarct. Small area of subcortical low-density involving the posterior right frontal lobe may also represent subacute ischemia. No hemorrhage. Findings are new from 2018 exam. No hydrocephalus or midline shift. No subdural or extra-axial collection. Vascular: There is high-density within both the left and right MCAs, however this is more prominent on the left. Skull: No fracture or focal lesion. Sinuses/Orbits: Left maxillary sinus mucosal thickening with bubbly debris and fluid level. Prior left mastoidectomy. Opacification of right mastoid air cells. Other: None. IMPRESSION: 1. Moderately large area of low-density in the subcortical white matter of the left frontal lobe suspicious for subacute  infarct. Small area of subcortical low-density involving the posterior right frontal lobe may also represent subacute ischemia. Findings are new from 2018 exam. Consider MRI for confirmation. 2. High-density within both the left and right MCAs, however this is more prominent on the left. CTA or MRA recommended. 3. Left maxillary sinus mucosal thickening with bubbly debris and fluid level, can be seen with acute sinusitis. Electronically Signed   By: Keith Rake M.D.   On: 04/07/2021 21:30   MR ANGIO HEAD WO CONTRAST  Result Date:  04/08/2021 CLINICAL DATA:  Acute neuro deficit. Right arm weakness. Lung mass on chest x-ray EXAM: MRI HEAD WITHOUT CONTRAST MRA HEAD WITHOUT CONTRAST TECHNIQUE: Multiplanar, multiecho pulse sequences of the brain and surrounding structures were obtained without intravenous contrast. Angiographic images of the head were obtained using MRA technique without contrast. COMPARISON:  CT head 04/07/2021 FINDINGS: MRI HEAD FINDINGS Brain: Negative for acute infarct. Moderately large area of vasogenic edema in the left frontal parietal lobe as noted on CT. There is an associated 15 mm mass in the left precentral gyrus. Second area vasogenic edema is smaller in the right parietal lobe also with a associated small mass lesion. Mass is best seen on FLAIR and diffusion weighted imaging. Additional small areas of edema in the right frontal lobe and right pericallosal white matter also suspicious for tumor. Separate areas of vasogenic edema in the right parietal lobe and right occipital lobe with associated small mass lesions. Small areas of FLAIR hyperintensity in the cerebellum bilaterally suspicious for metastatic disease. No acute hemorrhage.  Ventricle size normal.  No midline shift. Vascular: Normal arterial flow voids. Skull and upper cervical spine: No suspicious skeletal lesions. Sinuses/Orbits: Extensive mucosal edema and air-fluid level left maxillary sinus. Negative orbit. Bilateral  mastoid effusion Other: None MRA HEAD FINDINGS Right vertebral artery dominant and widely patent. Left vertebral artery is hypoplastic distally but does contribute to the basilar. PICA patent bilaterally. Atherosclerotic irregularity in the basilar with moderate stenosis in the distal basilar. Posterior cerebral arteries patent bilaterally without significant stenosis Atherosclerotic irregularity and stenosis in the cavernous carotid bilaterally. Anterior and middle cerebral arteries patent bilaterally without stenosis or large vessel occlusion. Negative for cerebral aneurysm. IMPRESSION: 1. Negative for acute infarct 2. Multiple intracranial mass lesions with surrounding vasogenic edema compatible with metastatic disease. Lung mass noted on chest x-ray 04/07/2021. Recommend follow-up MRI brain with contrast to evaluate metastatic disease 3. Intracranial atherosclerotic disease especially the basilar and posterior cerebral arteries bilaterally. Negative for large vessel occlusion. Electronically Signed   By: Franchot Gallo M.D.   On: 04/08/2021 13:15   MR BRAIN WO CONTRAST  Result Date: 04/08/2021 CLINICAL DATA:  Acute neuro deficit. Right arm weakness. Lung mass on chest x-ray EXAM: MRI HEAD WITHOUT CONTRAST MRA HEAD WITHOUT CONTRAST TECHNIQUE: Multiplanar, multiecho pulse sequences of the brain and surrounding structures were obtained without intravenous contrast. Angiographic images of the head were obtained using MRA technique without contrast. COMPARISON:  CT head 04/07/2021 FINDINGS: MRI HEAD FINDINGS Brain: Negative for acute infarct. Moderately large area of vasogenic edema in the left frontal parietal lobe as noted on CT. There is an associated 15 mm mass in the left precentral gyrus. Second area vasogenic edema is smaller in the right parietal lobe also with a associated small mass lesion. Mass is best seen on FLAIR and diffusion weighted imaging. Additional small areas of edema in the right frontal  lobe and right pericallosal white matter also suspicious for tumor. Separate areas of vasogenic edema in the right parietal lobe and right occipital lobe with associated small mass lesions. Small areas of FLAIR hyperintensity in the cerebellum bilaterally suspicious for metastatic disease. No acute hemorrhage.  Ventricle size normal.  No midline shift. Vascular: Normal arterial flow voids. Skull and upper cervical spine: No suspicious skeletal lesions. Sinuses/Orbits: Extensive mucosal edema and air-fluid level left maxillary sinus. Negative orbit. Bilateral mastoid effusion Other: None MRA HEAD FINDINGS Right vertebral artery dominant and widely patent. Left vertebral artery is hypoplastic distally but does contribute to the  basilar. PICA patent bilaterally. Atherosclerotic irregularity in the basilar with moderate stenosis in the distal basilar. Posterior cerebral arteries patent bilaterally without significant stenosis Atherosclerotic irregularity and stenosis in the cavernous carotid bilaterally. Anterior and middle cerebral arteries patent bilaterally without stenosis or large vessel occlusion. Negative for cerebral aneurysm. IMPRESSION: 1. Negative for acute infarct 2. Multiple intracranial mass lesions with surrounding vasogenic edema compatible with metastatic disease. Lung mass noted on chest x-ray 04/07/2021. Recommend follow-up MRI brain with contrast to evaluate metastatic disease 3. Intracranial atherosclerotic disease especially the basilar and posterior cerebral arteries bilaterally. Negative for large vessel occlusion. Electronically Signed   By: Franchot Gallo M.D.   On: 04/08/2021 13:15   MR BRAIN W CONTRAST  Result Date: 04/08/2021 CLINICAL DATA:  Multiple masses on noncontrast MRI EXAM: MRI HEAD WITH CONTRAST TECHNIQUE: Multiplanar, multiecho pulse sequences of the brain and surrounding structures were obtained with intravenous contrast. CONTRAST:  4mL GADAVIST GADOBUTROL 1 MMOL/ML IV SOLN  COMPARISON:  Earlier same day FINDINGS: Approximately 16 enhancing lesions are identified. The largest in the posterior left frontal lobe measures 1.5 cm involving the precentral gyrus. Small cerebellar lesions are present. Questionable No new finding. 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 Electronically Signed   By: Macy Mis M.D.   On: 04/08/2021 18:21   CT CHEST ABDOMEN PELVIS W CONTRAST  Result Date: 04/08/2021 CLINICAL DATA:  Right upper lobe lung mass on chest radiography, with intracranial metastatic disease. Staging workup. EXAM: CT CHEST, ABDOMEN, AND PELVIS WITH CONTRAST TECHNIQUE: Multidetector CT imaging of the chest, abdomen and pelvis was performed following the standard protocol during bolus administration of intravenous contrast. CONTRAST:  161mL OMNIPAQUE IOHEXOL 300 MG/ML  SOLN COMPARISON:  Chest radiograph 04/07/2021 and CT chest from 10/08/2017 FINDINGS: CT CHEST FINDINGS Cardiovascular: Coronary, aortic arch, and branch vessel atherosclerotic vascular disease. Moderate pericardial effusion. Mediastinum/Nodes: There is bilateral supraclavicular, bilateral hilar, bilateral subpectoral, prevascular, paratracheal, AP window, right hilar, and subcarinal pathologic adenopathy. Index right paratracheal node 2.6 cm in short axis, image 25 series 2. Index right hilar node 2.5 cm in short axis, image 29 series 2. There is also lower neck adenopathy in the level IV stations bilaterally, including a 1.0 cm right level IV lymph node on image 7 series 2. Lungs/Pleura: 3.7 by 3.7 cm right upper lobe mass, image 59 series 4. Part of this abuts the major fissure. Mild atelectasis medially in the right middle lobe. Airway thickening with airway plugging in the right lower lobe. 0.7 by 0.9 cm left upper lobe nodule medially on image 65 of series 4. Bandlike density in the lingula. Bandlike density with some nodularity measuring up to 1.0 cm in thickness in the left lower lobe (image 91,  series 4). Mild airway thickening in the left lower lobe. 0.5 cm nodule in the superior segment left lower lobe, image 64 series 4. Musculoskeletal: Unremarkable CT ABDOMEN PELVIS FINDINGS Hepatobiliary: Nodular liver contour suspicious for cirrhosis. No metastatic lesion is identified in the liver. Upper normal size of the gallbladder without overt gallbladder wall thickening. No biliary dilatation. Pancreas: Unremarkable Spleen: Unremarkable Adrenals/Urinary Tract: Both adrenal glands appear normal. The kidneys and urinary bladder appear normal. Stomach/Bowel: Unremarkable Vascular/Lymphatic: Aortoiliac atherosclerotic vascular disease. Mildly enlarged 1.1 cm in short axis porta hepatis lymph node on image 57 series 2, possibly reactive given the underlying cirrhosis. Portacaval node 1.2 cm in short axis, image 60 series 2. Reproductive: Unremarkable Other: Small collections of fluid and gas along the anterior abdominal wall are probably injection related.  Musculoskeletal: Lumbar spondylosis and degenerative disc disease causing bilateral foraminal impingement at L4-5 and L5-S1 and left foraminal impingement at L3-4. IMPRESSION: 1. 3.7 cm right upper lobe lung mass, with extensive thoracic and some lower neck adenopathy. In the context of the patient's intracranial metastatic deposits, this likely represents metastatic right upper lobe bronchogenic carcinoma. 2. 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 3. Moderate pericardial effusion. 4. Airway thickening in the lower lobes with some mild airway plugging in the right lower lobe. 5. Two small left-sided pulmonary nodules are indeterminate but merit surveillance. 6. Other imaging findings of potential clinical significance: Aortic Atherosclerosis (ICD10-I70.0). Coronary atherosclerosis. Multilevel lumbar impingement Electronically  Signed   By: Van Clines M.D.   On: 04/08/2021 18:46   DG CHEST PORT 1 VIEW  Result Date: 04/07/2021 CLINICAL DATA:  Cough EXAM: PORTABLE CHEST 1 VIEW COMPARISON:  09/27/2018 FINDINGS: The lungs are symmetrically well expanded. A 3.6 cm mass is developed within the right upper lobe. No pneumothorax or pleural effusion. Cardiac size within normal limits. Pulmonary vascularity is normal. No acute bone abnormality. IMPRESSION: Interval development of a 3.6 cm mass within the right upper lobe. Dedicated CT imaging is recommended for further evaluation. Electronically Signed   By: Fidela Salisbury MD   On: 04/07/2021 23:26   ECHOCARDIOGRAM COMPLETE  Result Date: 04/09/2021    ECHOCARDIOGRAM REPORT   Patient Name:   LAIYA WISBY [facility] Date of Exam: 04/09/2021 Medical Rec #:  [id number]   Height:       69.0 in Accession #:    [id number]  Weight:       201.9 lb Date of Birth:  1951-12-09    BSA:          2.075 m Patient Age:    35 years    BP:           96/84 mmHg Patient Gender: F           HR:           109 bpm. Exam Location:  [facility] Procedure: 2D Echo, Cardiac Doppler and Color Doppler Indications:    CVA  History:        Patient has prior history of Echocardiogram examinations, most                 recent 10/07/2017. Stroke and COPD; Risk Factors:Current Smoker,                 Hypertension, Diabetes and Dyslipidemia. Lung mass with Brain                 mets.  Sonographer:    Dustin Flock RDCS Referring Phys: [id number] ASIA [place]  1. Left ventricular ejection fraction, by estimation, is 65 to 70%. The left ventricle has normal function. The left ventricle has no regional wall motion abnormalities. There is mild left ventricular hypertrophy. Left ventricular diastolic parameters are indeterminate.  2. Right ventricular systolic function is normal. The right ventricular size is normal. There is normal pulmonary artery systolic pressure. The estimated right ventricular systolic pressure is  28.7 mmHg.  3. Small to moderate pericardial effusion. The pericardial effusion is posterior to the left ventricle, lateral to the left ventricle and anterior to the right ventricle. There is no evidence of cardiac tamponade. Fibrinous material noted suggesting relative chronicity.  4. The mitral valve is grossly normal.  Trivial mitral valve regurgitation.  5. The aortic valve is tricuspid. Aortic valve regurgitation is not visualized.  6. The inferior vena cava is dilated in size with >50% respiratory variability, suggesting right atrial pressure of 8 mmHg. FINDINGS  Left Ventricle: Left ventricular ejection fraction, by estimation, is 65 to 70%. The left ventricle has normal function. The left ventricle has no regional wall motion abnormalities. The left ventricular internal cavity size was normal in size. There is  mild left ventricular hypertrophy. Left ventricular diastolic parameters are indeterminate. Right Ventricle: The right ventricular size is normal. No increase in right ventricular wall thickness. Right ventricular systolic function is normal. There is normal pulmonary artery systolic pressure. The tricuspid regurgitant velocity is 1.71 m/s, and  with an assumed right atrial pressure of 8 mmHg, the estimated right ventricular systolic pressure is 00.3 mmHg. Left Atrium: Left atrial size was normal in size. Right Atrium: Right atrial size was normal in size. Pericardium: A moderately sized pericardial effusion is present. The pericardial effusion is posterior to the left ventricle, lateral to the left ventricle and anterior to the right ventricle. There is no evidence of cardiac tamponade. Mitral Valve: The mitral valve is grossly normal. Trivial mitral valve regurgitation. Tricuspid Valve: The tricuspid valve is grossly normal. Tricuspid valve regurgitation is trivial. Aortic Valve: The aortic valve is tricuspid. There is mild aortic valve annular calcification. Aortic valve regurgitation is not  visualized. Pulmonic Valve: The pulmonic valve was grossly normal. Pulmonic valve regurgitation is trivial. Aorta: The aortic root is normal in size and structure. Venous: The inferior vena cava is dilated in size with greater than 50% respiratory variability, suggesting right atrial pressure of 8 mmHg. IAS/Shunts: The interatrial septum appears to be lipomatous. No atrial level shunt detected by color flow Doppler.  LEFT VENTRICLE PLAX 2D LVIDd:         4.16 cm  Diastology LVIDs:         2.41 cm  LV e' medial:    6.20 cm/s LV PW:         1.16 cm  LV E/e' medial:  11.6 LV IVS:        1.21 cm  LV e' lateral:   4.46 cm/s LVOT diam:     2.00 cm  LV E/e' lateral: 16.1 LV SV:         67 LV SV Index:   32 LVOT Area:     3.14 cm  RIGHT VENTRICLE RV Basal diam:  2.95 cm RV S prime:     11.30 cm/s TAPSE (M-mode): 2.7 cm LEFT ATRIUM             Index       RIGHT ATRIUM           Index LA diam:        3.30 cm 1.59 cm/m  RA Area:     12.70 cm LA Vol (A2C):   39.2 ml 18.90 ml/m RA Volume:   29.20 ml  14.08 ml/m LA Vol (A4C):   28.8 ml 13.88 ml/m LA Biplane Vol: 34.3 ml 16.53 ml/m  AORTIC VALVE LVOT Vmax:   118.00 cm/s LVOT Vmean:  78.500 cm/s LVOT VTI:    0.212 m  AORTA Ao Root diam: 2.50 cm MITRAL VALVE               TRICUSPID VALVE MV Area (PHT): 3.91 cm    TR Peak grad:   11.7 mmHg MV Decel Time: 194 msec  TR Vmax:        171.00 cm/s MV E velocity: 72.00 cm/s MV A velocity: 88.30 cm/s  SHUNTS MV E/A ratio:  0.82        Systemic VTI:  0.21 m                            Systemic Diam: 2.00 cm Rozann Lesches MD Electronically signed by Rozann Lesches MD Signature Date/Time: 04/09/2021/4:09:53 PM    Final       IMPRESSION: 70 year old woman with at 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  She has started therapy for her cerebral edema in the form of dexamethasone 4 mg every 6 hours.  The patient will likely benefit from whole brain radiotherapy.  She  would not be a good candidate for stereotactic radiosurgery given the number of brain metastases.  PLAN:Today, I talked to the patient and family about the findings and work-up thus far.  We discussed the natural history of brain metastases and general treatment, highlighting the role of radiotherapy in the management.  We discussed the available radiation techniques, and focused on the details of logistics and delivery.  We reviewed the anticipated acute and late sequelae associated with radiation in this setting.  The patient was encouraged to ask questions that I answered to the best of my ability.    The patient would like to proceed with radiation and will be scheduled for CT simulation at 8 am on Monday 04/12/21.  Given her lack of transportation and poor social support, ideally she should remain hospitalized until after she completes her first radiation treatment Monday afternoon.  Once transportation for radiation on Monday or Tuesday, hopefully she can be discharged to home.  I spent 60 minutes total on this encounter  ------------------------------------------------   Tyler Pita, MD [place] Director and Director of Stereotactic Radiosurgery Direct Dial: [phone]  Fax: [phone] [email or web address]  Skype  [email or web address]

## 2021-04-09 NOTE — Progress Notes (Addendum)
PROGRESS NOTE    Patient: Tammy Blair                            PCP: [name], MD                    DOB: 10-09-1951            DOA: 04/07/2021 HEN:[id number]             DOS: 04/09/2021, 10:15 AM   LOS: 1 day   Date of Service: The patient was seen and examined on 04/09/2021  Subjective:   The patient was seen and examined this morning, stable to follow-up with new findings.Wilburn Mylar through stroke work-up MRI revealed brain mets, subsequently CT chest abdomen pelvis revealed lung mass metastasized to brain.... Chronic history of tobacco abuse Daughter and daughter-in-law at the bedside were informed of findings and current work-up Dr.Katragadda is consulted who will discuss the finding and work-up extensively with the family.  Recommending: Biopsy and radiation therapy for brain mets... To Elvina Sidle Patient and family are agreeable  Brief Narrative:   Tammy Blair  is a 70 y.o.f w h/o lumbar radiculopathy, hypertension, diabetes mellitus without complication, COPD with chronic respiratory failure requiring 2.5 to 3 L nasal cannula continuously, anxiety and more presents to the ED with a chief complaint of right arm weakness for 1 week, then she stated that it has been going on for  2-3 weeks.  Sister at bedside thinks is closer to 3 weeks duration.  Patient reports that it was sudden in onset.  Patient reports of no paresthesia, no right leg weakness.   She denies dysphagia, difficulty speaking, chest pain, palpitations, change in vision, change in hearing.  She does report a fall between the bathtub and the commode.   She was trying to lean onto her weak arm, and it did not work, so she fell to the floor.  She denies hitting her head, denies loss of consciousness.    Patient reports that she does smoke but she is working on quitting.  She does not drink alcohol.  She does not use illicit drugs.  She reports she is not vaccinated for COVID.  She is full code.  In the  ED Vitals were stable satting at 95% on 2 L Leukocytosis with a white blood cell count of 12.5, hemoglobin stable at 15.4 Hypokalemia at 3.3 CT head shows moderate to large area of low density in the left frontal lobe suspicious for subacute infarct. Shows high density within both left and right MCA and recommend CTA or MRA.  Also shows likely acute sinusitis Alcohol level is less than 10 EKG shows a heart rate of 67, QTC 426 with baseline wander  Subsequently admitted for stroke work-up    Assessment & Plan:   Principal Problem:   CVA (cerebral vascular accident) ([place]) Active Problems:   Tobacco use disorder   Type 2 diabetes mellitus without complication (HCC)   Hypokalemia   Leukocytosis   Stroke (cerebrum) (HCC)   Right lower lobe lung mass   Metastatic cancer to brain (HCC)   SOB (shortness of breath)   Lung mass with brain mets Acute right arm weakness - Due to brain mets (For right arm weakness patient had a stroke work-up which revealed metastatic brain mets) 1. Acute stroke was ruled out 2. Per CT findings: shows moderate to large area of low density in the  left frontal lobe suspicious for subacute infarct. Shows high density within both left and right MCA  3. MRI/MRA  >>> revealing brain mets, no acute finding of CVA 4. Echo : Pending final results 5. PT/ OT/ ST ; consulted for evaluation recommendations  6. Consult neuro. consulted heme oncology Dr.Katragadda  7. Oncologist recommending patient to be transferred to [facility] 8. Oncologist has contacted radiation oncology for radiation treatment for brain mets, also recommending biopsy of the nodules, interventional radiologist consulted for possible supraclavicular biopsy if not successful then lung biopsy 9. Will continue monitor on telemetry -looking for dysrhythmia 10. Oncology Dr.Katragadda started the patient on high-dose steroids, Decadron 10 mg then 4 mg p.o. every 6 hours Pulmonologist  consulted at [facility] Dr. June Leap with planning for bronchoscopy at [facility] on Tuesday      Status post fall 1. No noted acute trauma 2. Likely due to weakness from CVA,  3. Fall precaution, consulting PT/OT for evaluation   Hypokalemia 1. Replace and recheck 2. We will check magnesium  Chronic tobacco use disorder 1. Extensive counseling on cessation - and risks of smoking while on oxygen 2. Nicotine patch ordered  COPD with chronic respiratory failure - ? Asthma  1. Continue 2.5 - 3L nasal cannula O2 supplementation 2. Continue home meds, including Spiriva, Symbicort, 3. No signs of exacerbation, as needed DuoNeb  DMII 1. Hold metformin 2. We will check her blood sugar QA CHS, SSI coverage 3. Hgb A1C >>> 6.3  Chronic back pain   1.  As needed analgesics, PT OT  2.  Home medication includes Flexeril, naproxen, Neurontin, oxycodone  Hyperlipidemia   1.  Continue statins, maximizing dose for acute CVA  Hypertension   1.  But holding metformin for now  Anxiety  1.  Monitoring.  Home medication of Xanax  Sinusitis 1. Continue Augmentin 2. Leukocytosis 12.5 3. CXR w/o signs of infection 4. UA pending 5. Sinusitis on CT head  Possible hypothyroidism  1.  New finding of TSH 6.63  2.  Ordering free T3 and T4  3.  If double finding consistent with hypothyroidism considering initiating Synthroid  --------------------------------------------------------------------------------------------------------------------------------------------- Nutritional status:  The patient's BMI is: Body mass index is 29.82 kg/m. I agree with the assessment and plan as outlined   --------------------------------------------------------------------------------------------------------------------------------------------- Cultures; None   Antimicrobials: None    Consultants: Neurologist/oncologist/radiation  oncologist/IR   ------------------------------------------------------------------------------------------------------------------------------------------------  DVT prophylaxis:  SCD/Compression stockings and Heparin SQ Code Status:   Code Status: Full Code  Family Communication: Discussed with patient Discussed with patient daughter and daughter-in-law at bedside The above findings and plan of care has been discussed with patient (and family)  in detail,  they expressed understanding and agreement of above. -Advance care planning has been discussed.   Admission status:   Status is: Inpatient  Patient meets the inpatient criteria anticipating greater than 2 midnight stay for medical management treatment as mentioned above.  Dispo: The patient is from: Home              Anticipated d/c is to: PT OT recommending CIR-consulted  Likely will be ready for discharge in 1 to 2 days               Patient currently is not medically stable to d/c.  Pending stroke work-up.    Difficult to place patient No      Level of care: Telemetry   Procedures:   No admission procedures for hospital encounter.  Antimicrobials:  Anti-infectives (From admission, onward)   Start     Dose/Rate Route Frequency Ordered Stop   04/08/21 0145  amoxicillin-clavulanate (AUGMENTIN) 875-125 MG per tablet 1 tablet        1 tablet Oral Every 12 hours 04/08/21 0045 04/14/21 2159       Medication:  . amoxicillin-clavulanate  1 tablet Oral Q12H  . [START ON 04/10/2021] aspirin EC  81 mg Oral Daily  . atorvastatin  80 mg Oral Daily  . chlorthalidone  25 mg Oral Daily  . dexamethasone  4 mg Oral Q6H  . diltiazem  60 mg Oral BID  . famotidine  20 mg Oral Daily  . gabapentin  400 mg Oral TID  . heparin  5,000 Units Subcutaneous Q8H  . insulin aspart  0-15 Units Subcutaneous TID WC  . insulin aspart  0-5 Units Subcutaneous QHS  . metoprolol tartrate  100 mg Oral BID  . mometasone-formoterol  2 puff  Inhalation BID  . nicotine  21 mg Transdermal Daily  . umeclidinium bromide  1 puff Inhalation Daily    acetaminophen **OR** acetaminophen (TYLENOL) oral liquid 160 mg/5 mL **OR** acetaminophen, albuterol, cyclobenzaprine, ondansetron (ZOFRAN) IV, oxyCODONE   Objective:   Vitals:   04/08/21 2327 04/09/21 0513 04/09/21 0718 04/09/21 0802  BP: 134/66 126/64  96/84  Pulse: 84 87  (!) 109  Resp: 18 14  18   Temp: 98.7 F (37.1 C) (!) 97.5 F (36.4 C)  97.7 F (36.5 C)  TempSrc:  Oral  Oral  SpO2: 90% 93% 93% 95%  Weight:      Height:        Intake/Output Summary (Last 24 hours) at 04/09/2021 1015 Last data filed at 04/09/2021 0700 Gross per 24 hour  Intake 480 ml  Output --  Net 480 ml   Filed Weights   04/07/21 1923  Weight: 91.6 kg     Examination:      Physical Exam:   General:  Alert, oriented, cooperative, no distress;  During on the right  HEENT:  Normocephalic, PERRL, otherwise with in Normal limits   Neuro:   Right arm weakness, sensation intact  CNII-XII intact. , motor active in all other extremities, intact sensation, reflexes intact   Lungs:   Clear to auscultation BL, Respirations unlabored, no wheezes / crackles  Cardio:    S1/S2, RRR, No murmure, No Rubs or Gallops   Abdomen:   Soft, non-tender, bowel sounds active all four quadrants,  no guarding or peritoneal signs.  Muscular skeletal:   Right arm weakness  Limited exam - in bed, able to move all other 3 extremities,  2+ pulses,  symmetric, No pitting edema  Skin:  Dry, warm to touch, negative for any Rashes,  Wounds: Please see nursing documentation            ------------------------------------------------------------------------------------------------------------------------------------------    LABs:  CBC Latest Ref Rng & Units 04/07/2021 09/18/2018 09/17/2018  WBC 4.0 - 10.5 K/uL 12.5(H) 17.9(H) 20.3(H)  Hemoglobin 12.0 - 15.0 g/dL 15.0 13.7 14.3  Hematocrit 36.0 - 46.0 % 46.0 42.3  43.7  Platelets 150 - 400 K/uL 301 245 268   CMP Latest Ref Rng & Units 04/07/2021 09/18/2018 09/17/2018  Glucose 70 - 99 mg/dL 111(H) 243(H) 139(H)  BUN 8 - 23 mg/dL 18 9 8   Creatinine 0.44 - 1.00 mg/dL 0.63 0.71 0.67  Sodium 135 - 145 mmol/L 134(L) 139 134(L)  Potassium 3.5 - 5.1 mmol/L 3.3(L) 4.6 4.0  Chloride 98 -  111 mmol/L 90(L) 98 92(L)  CO2 22 - 32 mmol/L 35(H) 33(H) 33(H)  Calcium 8.9 - 10.3 mg/dL 9.3 9.1 9.1  Total Protein 6.5 - 8.1 g/dL 8.2(H) 7.6 -  Total Bilirubin 0.3 - 1.2 mg/dL 0.5 0.5 -  Alkaline Phos 38 - 126 U/L 58 55 -  AST 15 - 41 U/L 27 22 -  ALT 0 - 44 U/L 28 30 -       Micro Results Recent Results (from the past 240 hour(s))  Resp Panel by RT-PCR (Flu A&B, Covid) Nasopharyngeal Swab     Status: None   Collection Time: 04/07/21 11:02 PM   Specimen: Nasopharyngeal Swab; Nasopharyngeal(NP) swabs in vial transport medium  Result Value Ref Range Status   SARS Coronavirus 2 by RT PCR NEGATIVE NEGATIVE Final    Comment: (NOTE) SARS-CoV-2 target nucleic acids are NOT DETECTED.  The SARS-CoV-2 RNA is generally detectable in upper respiratory specimens during the acute phase of infection. The lowest concentration of SARS-CoV-2 viral copies this assay can detect is 138 copies/mL. A negative result does not preclude SARS-Cov-2 infection and should not be used as the sole basis for treatment or other patient management decisions. A negative result may occur with  improper specimen collection/handling, submission of specimen other than nasopharyngeal swab, presence of viral mutation(s) within the areas targeted by this assay, and inadequate number of viral copies(<138 copies/mL). A negative result must be combined with clinical observations, patient history, and epidemiological information. The expected result is Negative.  Fact Sheet for Patients:  EntrepreneurPulse.com.au  Fact Sheet for Healthcare Providers:   IncredibleEmployment.be  This test is no t yet approved or cleared by the [place] FDA and  has been authorized for detection and/or diagnosis of SARS-CoV-2 by FDA under an Emergency Use Authorization (EUA). This EUA will remain  in effect (meaning this test can be used) for the duration of the COVID-19 declaration under Section 564(b)(1) of the Act, 21 U.S.C.section 360bbb-3(b)(1), unless the authorization is terminated  or revoked sooner.       Influenza A by PCR NEGATIVE NEGATIVE Final   Influenza B by PCR NEGATIVE NEGATIVE Final    Comment: (NOTE) The Xpert Xpress SARS-CoV-2/FLU/RSV plus assay is intended as an aid in the diagnosis of influenza from Nasopharyngeal swab specimens and should not be used as a sole basis for treatment. Nasal washings and aspirates are unacceptable for Xpert Xpress SARS-CoV-2/FLU/RSV testing.  Fact Sheet for Patients: EntrepreneurPulse.com.au  Fact Sheet for Healthcare Providers: IncredibleEmployment.be  This test is not yet approved or cleared by the [place] FDA and has been authorized for detection and/or diagnosis of SARS-CoV-2 by FDA under an Emergency Use Authorization (EUA). This EUA will remain in effect (meaning this test can be used) for the duration of the COVID-19 declaration under Section 564(b)(1) of the Act, 21 U.S.C. section 360bbb-3(b)(1), unless the authorization is terminated or revoked.  Performed at [facility], [address], [place], [place] [address]     Radiology Reports CT Head Wo Contrast  Result Date: 04/07/2021 CLINICAL DATA:  Right arm weakness for the past week. EXAM: CT HEAD WITHOUT CONTRAST TECHNIQUE: Contiguous axial images were obtained from the base of the skull through the vertex without intravenous contrast. COMPARISON:  Head CT 10/07/2017 FINDINGS: Brain: Moderately large area of low-density in the subcortical white matter of the left  frontal lobe suspicious for subacute infarct. Small area of subcortical low-density involving the posterior right frontal lobe may also represent subacute ischemia. No hemorrhage.  Findings are new from 2018 exam. No hydrocephalus or midline shift. No subdural or extra-axial collection. Vascular: There is high-density within both the left and right MCAs, however this is more prominent on the left. Skull: No fracture or focal lesion. Sinuses/Orbits: Left maxillary sinus mucosal thickening with bubbly debris and fluid level. Prior left mastoidectomy. Opacification of right mastoid air cells. Other: None. IMPRESSION: 1. Moderately large area of low-density in the subcortical white matter of the left frontal lobe suspicious for subacute infarct. Small area of subcortical low-density involving the posterior right frontal lobe may also represent subacute ischemia. Findings are new from 2018 exam. Consider MRI for confirmation. 2. High-density within both the left and right MCAs, however this is more prominent on the left. CTA or MRA recommended. 3. Left maxillary sinus mucosal thickening with bubbly debris and fluid level, can be seen with acute sinusitis. Electronically Signed   By: Keith Rake M.D.   On: 04/07/2021 21:30   MR ANGIO HEAD WO CONTRAST  Result Date: 04/08/2021 CLINICAL DATA:  Acute neuro deficit. Right arm weakness. Lung mass on chest x-ray EXAM: MRI HEAD WITHOUT CONTRAST MRA HEAD WITHOUT CONTRAST TECHNIQUE: Multiplanar, multiecho pulse sequences of the brain and surrounding structures were obtained without intravenous contrast. Angiographic images of the head were obtained using MRA technique without contrast. COMPARISON:  CT head 04/07/2021 FINDINGS: MRI HEAD FINDINGS Brain: Negative for acute infarct. Moderately large area of vasogenic edema in the left frontal parietal lobe as noted on CT. There is an associated 15 mm mass in the left precentral gyrus. Second area vasogenic edema is smaller in  the right parietal lobe also with a associated small mass lesion. Mass is best seen on FLAIR and diffusion weighted imaging. Additional small areas of edema in the right frontal lobe and right pericallosal white matter also suspicious for tumor. Separate areas of vasogenic edema in the right parietal lobe and right occipital lobe with associated small mass lesions. Small areas of FLAIR hyperintensity in the cerebellum bilaterally suspicious for metastatic disease. No acute hemorrhage.  Ventricle size normal.  No midline shift. Vascular: Normal arterial flow voids. Skull and upper cervical spine: No suspicious skeletal lesions. Sinuses/Orbits: Extensive mucosal edema and air-fluid level left maxillary sinus. Negative orbit. Bilateral mastoid effusion Other: None MRA HEAD FINDINGS Right vertebral artery dominant and widely patent. Left vertebral artery is hypoplastic distally but does contribute to the basilar. PICA patent bilaterally. Atherosclerotic irregularity in the basilar with moderate stenosis in the distal basilar. Posterior cerebral arteries patent bilaterally without significant stenosis Atherosclerotic irregularity and stenosis in the cavernous carotid bilaterally. Anterior and middle cerebral arteries patent bilaterally without stenosis or large vessel occlusion. Negative for cerebral aneurysm. IMPRESSION: 1. Negative for acute infarct 2. Multiple intracranial mass lesions with surrounding vasogenic edema compatible with metastatic disease. Lung mass noted on chest x-ray 04/07/2021. Recommend follow-up MRI brain with contrast to evaluate metastatic disease 3. Intracranial atherosclerotic disease especially the basilar and posterior cerebral arteries bilaterally. Negative for large vessel occlusion. Electronically Signed   By: Franchot Gallo M.D.   On: 04/08/2021 13:15   MR BRAIN WO CONTRAST  Result Date: 04/08/2021 CLINICAL DATA:  Acute neuro deficit. Right arm weakness. Lung mass on chest x-ray EXAM:  MRI HEAD WITHOUT CONTRAST MRA HEAD WITHOUT CONTRAST TECHNIQUE: Multiplanar, multiecho pulse sequences of the brain and surrounding structures were obtained without intravenous contrast. Angiographic images of the head were obtained using MRA technique without contrast. COMPARISON:  CT head 04/07/2021 FINDINGS: MRI HEAD FINDINGS Brain:  Negative for acute infarct. Moderately large area of vasogenic edema in the left frontal parietal lobe as noted on CT. There is an associated 15 mm mass in the left precentral gyrus. Second area vasogenic edema is smaller in the right parietal lobe also with a associated small mass lesion. Mass is best seen on FLAIR and diffusion weighted imaging. Additional small areas of edema in the right frontal lobe and right pericallosal white matter also suspicious for tumor. Separate areas of vasogenic edema in the right parietal lobe and right occipital lobe with associated small mass lesions. Small areas of FLAIR hyperintensity in the cerebellum bilaterally suspicious for metastatic disease. No acute hemorrhage.  Ventricle size normal.  No midline shift. Vascular: Normal arterial flow voids. Skull and upper cervical spine: No suspicious skeletal lesions. Sinuses/Orbits: Extensive mucosal edema and air-fluid level left maxillary sinus. Negative orbit. Bilateral mastoid effusion Other: None MRA HEAD FINDINGS Right vertebral artery dominant and widely patent. Left vertebral artery is hypoplastic distally but does contribute to the basilar. PICA patent bilaterally. Atherosclerotic irregularity in the basilar with moderate stenosis in the distal basilar. Posterior cerebral arteries patent bilaterally without significant stenosis Atherosclerotic irregularity and stenosis in the cavernous carotid bilaterally. Anterior and middle cerebral arteries patent bilaterally without stenosis or large vessel occlusion. Negative for cerebral aneurysm. IMPRESSION: 1. Negative for acute infarct 2. Multiple  intracranial mass lesions with surrounding vasogenic edema compatible with metastatic disease. Lung mass noted on chest x-ray 04/07/2021. Recommend follow-up MRI brain with contrast to evaluate metastatic disease 3. Intracranial atherosclerotic disease especially the basilar and posterior cerebral arteries bilaterally. Negative for large vessel occlusion. Electronically Signed   By: Franchot Gallo M.D.   On: 04/08/2021 13:15   MR BRAIN W CONTRAST  Result Date: 04/08/2021 CLINICAL DATA:  Multiple masses on noncontrast MRI EXAM: MRI HEAD WITH CONTRAST TECHNIQUE: Multiplanar, multiecho pulse sequences of the brain and surrounding structures were obtained with intravenous contrast. CONTRAST:  93mL GADAVIST GADOBUTROL 1 MMOL/ML IV SOLN COMPARISON:  Earlier same day FINDINGS: Approximately 16 enhancing lesions are identified. The largest in the posterior left frontal lobe measures 1.5 cm involving the precentral gyrus. Small cerebellar lesions are present. Questionable No new finding. 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 Electronically Signed   By: Macy Mis M.D.   On: 04/08/2021 18:21   CT CHEST ABDOMEN PELVIS W CONTRAST  Result Date: 04/08/2021 CLINICAL DATA:  Right upper lobe lung mass on chest radiography, with intracranial metastatic disease. Staging workup. EXAM: CT CHEST, ABDOMEN, AND PELVIS WITH CONTRAST TECHNIQUE: Multidetector CT imaging of the chest, abdomen and pelvis was performed following the standard protocol during bolus administration of intravenous contrast. CONTRAST:  128mL OMNIPAQUE IOHEXOL 300 MG/ML  SOLN COMPARISON:  Chest radiograph 04/07/2021 and CT chest from 10/08/2017 FINDINGS: CT CHEST FINDINGS Cardiovascular: Coronary, aortic arch, and branch vessel atherosclerotic vascular disease. Moderate pericardial effusion. Mediastinum/Nodes: There is bilateral supraclavicular, bilateral hilar, bilateral subpectoral, prevascular, paratracheal, AP window, right hilar, and  subcarinal pathologic adenopathy. Index right paratracheal node 2.6 cm in short axis, image 25 series 2. Index right hilar node 2.5 cm in short axis, image 29 series 2. There is also lower neck adenopathy in the level IV stations bilaterally, including a 1.0 cm right level IV lymph node on image 7 series 2. Lungs/Pleura: 3.7 by 3.7 cm right upper lobe mass, image 59 series 4. Part of this abuts the major fissure. Mild atelectasis medially in the right middle lobe. Airway thickening with airway plugging in the right lower lobe.  0.7 by 0.9 cm left upper lobe nodule medially on image 65 of series 4. Bandlike density in the lingula. Bandlike density with some nodularity measuring up to 1.0 cm in thickness in the left lower lobe (image 91, series 4). Mild airway thickening in the left lower lobe. 0.5 cm nodule in the superior segment left lower lobe, image 64 series 4. Musculoskeletal: Unremarkable CT ABDOMEN PELVIS FINDINGS Hepatobiliary: Nodular liver contour suspicious for cirrhosis. No metastatic lesion is identified in the liver. Upper normal size of the gallbladder without overt gallbladder wall thickening. No biliary dilatation. Pancreas: Unremarkable Spleen: Unremarkable Adrenals/Urinary Tract: Both adrenal glands appear normal. The kidneys and urinary bladder appear normal. Stomach/Bowel: Unremarkable Vascular/Lymphatic: Aortoiliac atherosclerotic vascular disease. Mildly enlarged 1.1 cm in short axis porta hepatis lymph node on image 57 series 2, possibly reactive given the underlying cirrhosis. Portacaval node 1.2 cm in short axis, image 60 series 2. Reproductive: Unremarkable Other: Small collections of fluid and gas along the anterior abdominal wall are probably injection related. Musculoskeletal: Lumbar spondylosis and degenerative disc disease causing bilateral foraminal impingement at L4-5 and L5-S1 and left foraminal impingement at L3-4. IMPRESSION: 1. 3.7 cm right upper lobe lung mass, with extensive  thoracic and some lower neck adenopathy. In the context of the patient's intracranial metastatic deposits, this likely represents metastatic right upper lobe bronchogenic carcinoma. 2. 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 3. Moderate pericardial effusion. 4. Airway thickening in the lower lobes with some mild airway plugging in the right lower lobe. 5. Two small left-sided pulmonary nodules are indeterminate but merit surveillance. 6. Other imaging findings of potential clinical significance: Aortic Atherosclerosis (ICD10-I70.0). Coronary atherosclerosis. Multilevel lumbar impingement Electronically Signed   By: Van Clines M.D.   On: 04/08/2021 18:46   DG CHEST PORT 1 VIEW  Result Date: 04/07/2021 CLINICAL DATA:  Cough EXAM: PORTABLE CHEST 1 VIEW COMPARISON:  09/27/2018 FINDINGS: The lungs are symmetrically well expanded. A 3.6 cm mass is developed within the right upper lobe. No pneumothorax or pleural effusion. Cardiac size within normal limits. Pulmonary vascularity is normal. No acute bone abnormality. IMPRESSION: Interval development of a 3.6 cm mass within the right upper lobe. Dedicated CT imaging is recommended for further evaluation. Electronically Signed   By: Fidela Salisbury MD   On: 04/07/2021 23:26    SIGNED: Deatra James, MD, FHM. [facility],  Pager (please use [email or web address] to page/text) Please use Epic Secure Chat for non-urgent communication (7AM-7PM)  If 7PM-7AM, please contact night-coverage [email or web address], 04/09/2021, 10:15 AM

## 2021-04-09 NOTE — Consult Note (Signed)
Consultation Note Date: 04/09/2021   Patient Name: Tammy Blair  DOB: 1951-08-24  MRN: [id number]  Age / Sex: 70 y.o., female  PCP: Corrington, Delsa Grana, MD Referring Physician: Deatra James, MD  Reason for Consultation: Establishing goals of care and Psychosocial/spiritual support  HPI/Patient Profile: 70 y.o. 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   Clinical Assessment and Goals of Care: Tammy Blair, Tammy Blair, is sitting up quietly in bed.  She greets me making and mostly keeping eye contact.  She appears acutely/chronically ill and quite frail.  She is very hard of hearing.  She is alert and oriented, able to make her basic needs known.  There is no family at bedside at this time.  We briefly talked about her acute health concerns.  She tells me that she is worried.  We talked about the treatment plan including, but not limited to transfer to [facility] for radiation therapy, outpatient oncology visits, acute inpatient rehab.  We briefly talked about healthcare power of attorney, [name] names her daughter, Tammy Blair, as her healthcare surrogate.  We talked about CODE STATUS.  [name] endorses full scope/full code at this time.  Would benefit from further goals of care discussion with outpatient palliative services.    HCPOA   NEXT OF KIN -daughter, Tammy Blair    SUMMARY OF RECOMMENDATIONS   Full scope/full code Agreeable to any and all treatments as offered Evaluated and accepted by [facility]   Code Status/Advance Care Planning:  Full code  Symptom Management:   Per hospitalist, no additional needs at this time.  Palliative Prophylaxis:   Frequent Pain Assessment  and Oral Care  Additional Recommendations (Limitations, Scope, Preferences):  Full Scope Treatment  Psycho-social/Spiritual:   Desire for further Chaplaincy support:no  Additional Recommendations: Caregiving  Support/Resources  Prognosis:   Unable to determine, based on outcomes.  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  Would benefit from outpatient palliative services.      Primary Diagnoses: Present on Admission: . CVA (cerebral vascular accident) ([place]) . Hypokalemia . Leukocytosis . Tobacco use disorder . Stroke (cerebrum) ([place])   I have reviewed the medical record, interviewed the patient and family, and examined the patient. The following aspects are pertinent.  Past Medical History:  Diagnosis Date  . Anxiety   . Asthma   . Chronic back pain   . COPD (chronic obstructive pulmonary disease) ([place])    on home O2  . Diabetes mellitus without complication ([place])   . HOH (hard of hearing)   . Hypertension   . Lumbar radiculopathy    Social History   Socioeconomic History  . Marital status: Divorced    Spouse name: Not on file  .  Number of children: Not on file  . Years of education: Not on file  . Highest education level: Not on file  Occupational History  . Not on file  Tobacco Use  . Smoking status: Current Every Day Smoker    Packs/day: 0.50    Years: 40.00    Pack years: 20.00    Types: Cigarettes  . Smokeless tobacco: Never Used  Vaping Use  . Vaping Use: Never used  Substance and Sexual Activity  . Alcohol use: No  . Drug use: Not Currently  . Sexual activity: Not Currently  Other Topics Concern  . Not on file  Social History Narrative  . Not on file   Social Determinants of Health   Financial Resource Strain: Not on file  Food Insecurity: Not on file  Transportation Needs: Not on file  Physical Activity: Not  on file  Stress: Not on file  Social Connections: Not on file   Family History  Problem Relation Age of Onset  . COPD Mother   . Cancer Mother        kidney  . Stroke Brother   . Asthma Sister    Scheduled Meds: . amoxicillin-clavulanate  1 tablet Oral Q12H  . [START ON 04/10/2021] aspirin EC  81 mg Oral Daily  . atorvastatin  80 mg Oral Daily  . chlorthalidone  25 mg Oral Daily  . dexamethasone  4 mg Oral Q6H  . diltiazem  60 mg Oral BID  . famotidine  20 mg Oral Daily  . gabapentin  400 mg Oral TID  . heparin  5,000 Units Subcutaneous Q8H  . insulin aspart  0-15 Units Subcutaneous TID WC  . insulin aspart  0-5 Units Subcutaneous QHS  . metoprolol tartrate  100 mg Oral BID  . mometasone-formoterol  2 puff Inhalation BID  . nicotine  21 mg Transdermal Daily  . umeclidinium bromide  1 puff Inhalation Daily   Continuous Infusions: PRN Meds:.acetaminophen **OR** acetaminophen (TYLENOL) oral liquid 160 mg/5 mL **OR** acetaminophen, albuterol, cyclobenzaprine, ondansetron (ZOFRAN) IV, oxyCODONE Medications Prior to Admission:  Prior to Admission medications   Medication Sig Start Date End Date Taking? Authorizing Provider  albuterol (PROVENTIL HFA;VENTOLIN HFA) 108 (90 Base) MCG/ACT inhaler Inhale 1-2 puffs into the lungs every 4 (four) hours as needed for wheezing or shortness of breath. 03/14/16  Yes Mesner, Corene Cornea, MD  albuterol (PROVENTIL) (2.5 MG/3ML) 0.083% nebulizer solution Take 3 mLs (2.5 mg total) by nebulization every 4 (four) hours as needed. For shortness of breath 06/23/18  Yes Kathie Dike, MD  atorvastatin (LIPITOR) 10 MG tablet Take 10 mg by mouth daily. 12/10/20  Yes [provider]  budesonide-formoterol (SYMBICORT) 160-4.5 MCG/ACT inhaler Inhale 1 puff into the lungs 2 (two) times daily. 06/23/18  Yes Kathie Dike, MD  chlorthalidone (HYGROTON) 25 MG tablet Take 25 mg by mouth daily. With food 12/10/20  Yes [provider]  cyclobenzaprine  (FLEXERIL) 10 MG tablet Take 0.5 tablets (5 mg total) by mouth 3 (three) times daily as needed for muscle spasms. For muscle spasm 10/12/17  Yes Kathie Dike, MD  diltiazem (CARDIZEM) 60 MG tablet Take 60 mg by mouth 2 (two) times daily.   Yes [provider]  famotidine (PEPCID) 20 MG tablet Take 20 mg by mouth daily. 04/22/20  Yes [provider]  gabapentin (NEURONTIN) 300 MG capsule Take 400 mg by mouth 3 (three) times daily.   Yes [provider]  malathion (OVIDE) 0.5 % lotion Apply topically. 03/29/21  Yes [provider]  metFORMIN (GLUCOPHAGE-XR) 500 MG 24 hr tablet Take 500 mg by mouth in the morning and at bedtime. With supper.   Yes [provider]  metoprolol tartrate (LOPRESSOR) 100 MG tablet Take 1 tablet by mouth 2 (two) times daily. 03/23/18  Yes [provider]  ALPRAZolam Duanne Moron) 0.5 MG tablet Take 0.5 mg by mouth 3 (three) times daily as needed for anxiety. For anxiety Patient not taking: No sig reported    [provider]  naproxen (NAPROSYN) 500 MG tablet Take 1 tablet (500 mg total) by mouth 2 (two) times daily with a meal. Patient not taking: No sig reported 08/20/17   Noemi Chapel, MD  Oxycodone HCl 10 MG TABS Take 0.5 tablets (5 mg total) by mouth 4 (four) times daily as needed. Patient not taking: No sig reported 10/12/17   Kathie Dike, MD  predniSONE (DELTASONE) 10 MG tablet Take 40mg  po daily for 2 days then 30mg  daily for 2 days then 20mg  daily for 2 days then 10mg  daily for 2 days then stop Patient not taking: No sig reported 09/18/18   Kathie Dike, MD  ranitidine (ZANTAC) 150 MG tablet Take 150 mg by mouth 2 (two) times daily. Patient not taking: No sig reported    [provider]  tiotropium (SPIRIVA) 18 MCG inhalation capsule Place 1 capsule (18 mcg total) into inhaler and inhale daily. Patient not taking: No sig reported 06/23/18   Kathie Dike, MD  TRIAMCINOLONE ACETONIDE, TOP, 0.05 %  OINT Apply 1 application topically 2 (two) times daily as needed. Patient not taking: No sig reported 12/28/15   [provider]  valACYclovir (VALTREX) 1000 MG tablet TAKE (1) TABLET BY MOUTH (3) TIMES DAILY. Patient not taking: No sig reported 02/16/20   [provider]   No Known Allergies Review of Systems  Unable to perform ROS: Other    Physical Exam Vitals and nursing note reviewed.  Constitutional:      General: She is not in acute distress.    Appearance: She is ill-appearing.  HENT:     Mouth/Throat:     Mouth: Mucous membranes are moist.  Cardiovascular:     Rate and Rhythm: Normal rate and regular rhythm.  Pulmonary:     Effort: Pulmonary effort is normal. No respiratory distress.  Abdominal:     General: Abdomen is flat.  Musculoskeletal:     Comments: Muscle wasting  Skin:    General: Skin is warm and dry.  Neurological:     Mental Status: She is alert and oriented to person, place, and time.  Psychiatric:        Mood and Affect: Mood normal.        Behavior: Behavior normal.     Vital Signs: BP 96/84 (BP Location: Left Arm)   Pulse (!) 109   Temp 97.7 F (36.5 C) (Oral)   Resp 18   Ht 5\' 9"  (1.753 m)   Wt 91.6 kg   SpO2 95%   BMI 29.82 kg/m  Pain Scale: 0-10 POSS *See Group Information*: 1-Acceptable,Awake and alert Pain Score: 0-No pain   SpO2: SpO2: 95 % O2 Device:SpO2: 95 % O2 Flow Rate: .O2 Flow Rate (L/min): 3 L/min  IO: Intake/output summary:   Intake/Output Summary (Last 24 hours) at 04/09/2021 1149 Last data filed at 04/09/2021 0835 Gross per 24 hour  Intake 720 ml  Output --  Net 720 ml    LBM: Last BM Date: 04/06/21 Baseline Weight: Weight:  91.6 kg Most recent weight: Weight: 91.6 kg     Palliative Assessment/Data:   Flowsheet Rows   Flowsheet Row Most Recent Value  Intake Tab   Referral Department Hospitalist  Unit at Time of Referral Cardiac/Telemetry Unit  Palliative Care Primary Diagnosis Cancer   Date Notified 04/08/21  Palliative Care Type New Palliative care  Reason for referral Clarify Goals of Care  Date of Admission 04/07/21  Date first seen by Palliative Care 04/09/21  # of days Palliative referral response time 1 Day(s)  # of days IP prior to Palliative referral 1  Clinical Assessment   Palliative Performance Scale Score 50%  Pain Max last 24 hours Not able to report  Pain Min Last 24 hours Not able to report  Dyspnea Max Last 24 Hours Not able to report  Dyspnea Min Last 24 hours Not able to report  Psychosocial & Spiritual Assessment   Palliative Care Outcomes       Time In: 0900 Time Out: 0935 Time Total: 35 minutes  Greater than 50%  of this time was spent counseling and coordinating care related to the above assessment and plan.  Signed by: Drue Novel, NP   Please contact Palliative Medicine Team phone at [phone] for questions and concerns.  For individual provider: See Shea Evans

## 2021-04-10 DIAGNOSIS — I10 Essential (primary) hypertension: Secondary | ICD-10-CM

## 2021-04-10 DIAGNOSIS — R531 Weakness: Secondary | ICD-10-CM | POA: Diagnosis not present

## 2021-04-10 DIAGNOSIS — G8929 Other chronic pain: Secondary | ICD-10-CM

## 2021-04-10 DIAGNOSIS — C7931 Secondary malignant neoplasm of brain: Secondary | ICD-10-CM | POA: Diagnosis not present

## 2021-04-10 DIAGNOSIS — I639 Cerebral infarction, unspecified: Secondary | ICD-10-CM | POA: Diagnosis not present

## 2021-04-10 DIAGNOSIS — C3411 Malignant neoplasm of upper lobe, right bronchus or lung: Principal | ICD-10-CM

## 2021-04-10 DIAGNOSIS — M5440 Lumbago with sciatica, unspecified side: Secondary | ICD-10-CM

## 2021-04-10 DIAGNOSIS — E876 Hypokalemia: Secondary | ICD-10-CM | POA: Diagnosis not present

## 2021-04-10 DIAGNOSIS — F419 Anxiety disorder, unspecified: Secondary | ICD-10-CM

## 2021-04-10 DIAGNOSIS — M6281 Muscle weakness (generalized): Secondary | ICD-10-CM

## 2021-04-10 LAB — GLUCOSE, CAPILLARY
Glucose-Capillary: 239 mg/dL — ABNORMAL HIGH (ref 70–99)
Glucose-Capillary: 190 mg/dL — ABNORMAL HIGH (ref 70–99)
Glucose-Capillary: 243 mg/dL — ABNORMAL HIGH (ref 70–99)
Glucose-Capillary: 259 mg/dL — ABNORMAL HIGH (ref 70–99)
Glucose-Capillary: 258 mg/dL — ABNORMAL HIGH (ref 70–99)

## 2021-04-10 LAB — T3, FREE: T3, Free: 2.9 pg/mL (ref 2.0–4.4)

## 2021-04-10 MED ORDER — INSULIN GLARGINE 100 UNIT/ML ~~LOC~~ SOLN
6.0000 [IU] | Freq: Two times a day (BID) | SUBCUTANEOUS | Status: DC
  Administered 2021-04-10 (×2): 6 [IU] via SUBCUTANEOUS
  Filled 2021-04-10 (×3): qty 0.06

## 2021-04-10 MED ORDER — INSULIN ASPART 100 UNIT/ML IJ SOLN
4.0000 [IU] | Freq: Three times a day (TID) | INTRAMUSCULAR | Status: DC
  Administered 2021-04-10: 4 [IU] via SUBCUTANEOUS

## 2021-04-10 MED ORDER — INSULIN ASPART 100 UNIT/ML IJ SOLN
0.0000 [IU] | Freq: Three times a day (TID) | INTRAMUSCULAR | Status: DC
  Administered 2021-04-10: 8 [IU] via SUBCUTANEOUS
  Administered 2021-04-11: 5 [IU] via SUBCUTANEOUS
  Administered 2021-04-11: 3 [IU] via SUBCUTANEOUS
  Administered 2021-04-11: 11 [IU] via SUBCUTANEOUS
  Administered 2021-04-12: 8 [IU] via SUBCUTANEOUS

## 2021-04-10 MED ORDER — ALPRAZOLAM 0.25 MG PO TABS
0.2500 mg | ORAL_TABLET | Freq: Three times a day (TID) | ORAL | Status: DC | PRN
  Administered 2021-04-10 – 2021-04-11 (×3): 0.25 mg via ORAL
  Filled 2021-04-10 (×3): qty 1

## 2021-04-10 MED ORDER — INSULIN ASPART 100 UNIT/ML IJ SOLN
0.0000 [IU] | Freq: Every day | INTRAMUSCULAR | Status: DC
  Administered 2021-04-10 – 2021-04-11 (×2): 3 [IU] via SUBCUTANEOUS

## 2021-04-10 NOTE — Progress Notes (Signed)
RN informed patient's visitor / significant other that visiting hours would be ending at [place] explained that the patient wanted him to stay the night, RN explained that we are not allowed to let visitors stay over night at this time. Visitor went on to say "I don't have a ride, I guess I'll have to call around to see who can pick me up".

## 2021-04-10 NOTE — Progress Notes (Signed)
PROGRESS NOTE  Tammy Blair WNI:[id number] DOB: June 03, 1951   PCP: [name], MD  Patient is from: Home.  Lives with husband.  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    Initially, concern about CVA.  CT head showed moderate to large area of low density in the left frontal lobe concerning for CVA.  However, MRI brain negative for CVA but  multiple intracranial lesions with surrounding vasogenic edema compatible with metastatic disease.  CT chest/abdomen/pelvis showed 3.7 cm RUL lung mass with extensive thoracic and some lower neck adenopathy concerning for bronchogenic carcinoma with mets to brain.  CT also showed moderate pericardial effusion.  Oncology consulted and started patient on Decadron for vasogenic edema.  Palliative medicine and pulmonology consulted as well.  Patient transferred to [facility] for radiation oncology, which is planned to start on 04/12/2021.  Pulmonology planning bronchoscopy/ biopsy on 04/13/2021.  Therapy recommended CIR.  Subjective: Seen and examined earlier this morning.  Complains low back pain which is chronic and unchanged from baseline.  She says she is anxious with everything going on.  Reports taking Xanax at home.  She denies shortness of breath.  Still with weakness in right arm but no numbness or tingling.  Denies GI or UTI symptoms.  Patient's husband at bedside.  Objective: Vitals:   04/09/21 2049 04/09/21 2222 04/09/21 2306 04/10/21 0346  BP: 132/71 (!) 146/75  139/68  Pulse: 83 84  75  Resp: 16 18  18   Temp: 98.4 F (36.9 C) 98.6 F (37 C)  98.1 F (36.7 C)  TempSrc: Oral Oral  Oral  SpO2: 94% 94% 94% 95%  Weight:       Height:        Intake/Output Summary (Last 24 hours) at 04/10/2021 1143 Last data filed at 04/10/2021 1008 Gross per 24 hour  Intake 720 ml  Output --  Net 720 ml   Filed Weights   04/07/21 1923  Weight: 91.6 kg    Examination:  GENERAL: No apparent distress.  Nontoxic. HEENT: MMM.  Significantly diminished hearing.  NECK: Supple.  No apparent JVD.  RESP:  No IWOB.  Fair aeration bilaterally. CVS:  RRR. Heart sounds normal.  ABD/GI/GU: BS+. Abd soft, NTND.  MSK/EXT:  Moves extremities. No apparent deformity. No edema.  SKIN: no apparent skin lesion or wound NEURO: Awake, alert and oriented appropriately.  Cranial nerves intact except for diminished hearing.  RUE weakness.  PSYCH: Somewhat anxious.  Procedures:  None  Microbiology summarized: [id number] and influenza PCR nonreactive.  Assessment & Plan: Lung cancer with brain metastasis and intrathoracic lymphadenopathy-history of heavy tobacco use -Plan for radiation therapy starting on 04/12/2021 -Continue Decadron 4 mg every 6 hours -Plan for bronchoscopy/lung biopsy by pulmonology on 04/13/2021 -Appreciate help by palliative medicine  Right arm weakness: Likely due to the above.  MRI negative for CVA -Continue therapy-recommended CIR.  Fall at home: Likely due to the above.  Did not hit her head or lose consciousness.  No traumatic injury. -Continue therapy as above -Continue fall precaution  Mild to moderate pericardial effusion: Likely due to malignancy.  No clinical signs of tamponade.  Chronic COPD/RF-on 3 L at baseline. -On Decadron  for vasogenic edema -Continue breathing treatments -May consider switching beta-blocker to Cardizem  Chronic low back pain/lumbar radiculopathy: No acute neuro findings in the lower extremities. -Decadron might help -Continue Tylenol and as needed Flexeril, oxycodone -Continue gabapentin.  Controlled NIDDM-2 with hyperglycemia and hyperlipidemia: A1c 6.3%.  Hyperglycemia  likely in the setting of steroid. Recent Labs  Lab 04/09/21 0748 04/09/21 1151 04/09/21 1729 04/09/21 2216 04/10/21 0731  GLUCAP 340* 252* 208* 239* 190*  -Continue SSI-moderate -On Lantus 6 units twice daily -Continue statin.  Tobacco use disorder- -continue nicotine patch  Essential hypertension: Normotensive for most part. -Continue metoprolol and Cardizem  Sinusitis? -Continue p.o. Augmentin  Euthyroid syndrome-TSH mildly elevated.  Free T4 within normal.  Goal of care: Patient with new metastatic lung cancer, chronic COPD with respiratory failure.  Poor long-term prognosis.  Still full code which I believe would pose more harm than benefit -Palliative medicine following  Anxiety: Somewhat anxious -Resume home Xanax  Overweight Body mass index is 29.82 kg/m.         DVT prophylaxis:  heparin injection 5,000 Units Start: 04/08/21 0600 SCD's Start: 04/08/21 0058  Code Status: Full code Family Communication: Updated patient's husband at bedside Level of care: Telemetry Status is: Inpatient  Remains inpatient appropriate because:Ongoing diagnostic testing needed not appropriate for outpatient work up, Unsafe d/c plan, IV treatments appropriate due to intensity of illness or inability to take PO and Inpatient level of care appropriate due to severity of illness   Dispo: The patient is from: Home              Anticipated d/c is to: CIR              Patient currently is not medically stable to d/c.   Difficult to place patient No       Consultants:  Oncology Radiation oncology Pulmonology Palliative medicine   Sch Meds:  Scheduled Meds: . amoxicillin-clavulanate  1 tablet Oral Q12H  . aspirin EC  81 mg Oral Daily  . atorvastatin  80 mg Oral Daily  . chlorthalidone  25 mg Oral Daily  . dexamethasone  4 mg Oral Q6H  . diltiazem  60 mg Oral BID  . famotidine  20 mg Oral Daily  . gabapentin  400 mg Oral TID  . heparin  5,000 Units Subcutaneous  Q8H  . insulin aspart  0-15 Units Subcutaneous TID WC  . insulin aspart  0-5 Units Subcutaneous QHS  . metoprolol tartrate  100 mg Oral BID  . mometasone-formoterol  2 puff Inhalation BID  . nicotine  21 mg Transdermal Daily  . umeclidinium bromide  1 puff Inhalation Daily   Continuous Infusions: PRN Meds:.acetaminophen **OR** acetaminophen (TYLENOL) oral liquid 160 mg/5 mL **OR** acetaminophen, albuterol, ALPRAZolam, cyclobenzaprine, ondansetron (ZOFRAN) IV, oxyCODONE  Antimicrobials: Anti-infectives (From admission, onward)   Start     Dose/Rate Route Frequency Ordered Stop   04/08/21 0145  amoxicillin-clavulanate (AUGMENTIN) 875-125 MG per tablet 1 tablet        1 tablet Oral Every 12 hours 04/08/21 0045 04/14/21 2159       I have personally reviewed the following labs and images: CBC: Recent Labs  Lab 04/07/21 2107  WBC 12.5*  NEUTROABS 8.4*  HGB 15.0  HCT 46.0  MCV 96.8  PLT 301   BMP &GFR Recent Labs  Lab 04/07/21 2107  NA 134*  K 3.3*  CL 90*  CO2 35*  GLUCOSE 111*  BUN 18  CREATININE 0.63  CALCIUM 9.3  Estimated Creatinine Clearance: 81.2 mL/min (by C-G formula based on SCr of 0.63 mg/dL). Liver & Pancreas: Recent Labs  Lab 04/07/21 2107  AST 27  ALT 28  ALKPHOS 58  BILITOT 0.5  PROT 8.2*  ALBUMIN 3.9   No results for input(s): LIPASE, AMYLASE in the last 168 hours. No results for input(s): AMMONIA in the last 168 hours. Diabetic: Recent Labs    04/08/21 0524  HGBA1C 6.3*   Recent Labs  Lab 04/09/21 0748 04/09/21 1151 04/09/21 1729 04/09/21 2216 04/10/21 0731  GLUCAP 340* 252* 208* 239* 190*   Cardiac Enzymes: No results for input(s): CKTOTAL, CKMB, CKMBINDEX, TROPONINI in the last 168 hours. No results for input(s): PROBNP in the last 8760 hours. Coagulation Profile: No results for input(s): INR, PROTIME in the last 168 hours. Thyroid Function Tests: Recent Labs    04/08/21 0816 04/09/21 1324  TSH 6.632*  --   FREET4  --   1.04  T3FREE  --  2.9   Lipid Profile: Recent Labs    04/08/21 0524  CHOL 126  HDL 41  LDLCALC 66  TRIG 93  CHOLHDL 3.1   Anemia Panel: Recent Labs    04/08/21 0816  VITAMINB12 481   Urine analysis:    Component Value Date/Time   COLORURINE YELLOW 04/08/2021 1600   APPEARANCEUR CLEAR 04/08/2021 1600   LABSPEC 1.012 04/08/2021 1600   PHURINE 6.0 04/08/2021 1600   GLUCOSEU NEGATIVE 04/08/2021 1600   HGBUR NEGATIVE 04/08/2021 1600   BILIRUBINUR NEGATIVE 04/08/2021 1600   KETONESUR NEGATIVE 04/08/2021 1600   PROTEINUR NEGATIVE 04/08/2021 1600   UROBILINOGEN 0.2 07/17/2014 1613   NITRITE NEGATIVE 04/08/2021 1600   LEUKOCYTESUR SMALL (A) 04/08/2021 1600   Sepsis Labs: Invalid input(s): PROCALCITONIN, [place]  Microbiology: Recent Results (from the past 240 hour(s))  Resp Panel by RT-PCR (Flu A&B, Covid) Nasopharyngeal Swab     Status: None   Collection Time: 04/07/21 11:02 PM   Specimen: Nasopharyngeal Swab; Nasopharyngeal(NP) swabs in vial transport medium  Result Value Ref Range Status   SARS Coronavirus 2 by RT PCR NEGATIVE NEGATIVE Final    Comment: (NOTE) SARS-CoV-2 target nucleic acids are NOT DETECTED.  The SARS-CoV-2 RNA is generally detectable in upper respiratory specimens during the acute phase of infection. The lowest concentration of SARS-CoV-2 viral copies this assay can detect is 138 copies/mL. A negative result does not preclude SARS-Cov-2 infection and should not be used as the sole basis for treatment or other patient management decisions. A negative result may occur with  improper specimen collection/handling, submission of specimen other than nasopharyngeal swab, presence of viral mutation(s) within the areas targeted by this assay, and inadequate number of viral copies(<138 copies/mL). A negative result must be combined with clinical observations, patient history, and epidemiological information. The expected result is Negative.  Fact  Sheet for Patients:  EntrepreneurPulse.com.au  Fact Sheet for Healthcare Providers:  IncredibleEmployment.be  This test is no t yet approved or cleared by the [place] FDA and  has been authorized for detection and/or diagnosis of SARS-CoV-2 by FDA under an Emergency Use Authorization (EUA). This EUA will remain  in effect (meaning this test can be used) for the duration of the COVID-19 declaration under Section 564(b)(1) of the Act, 21 U.S.C.section 360bbb-3(b)(1), unless the authorization is terminated  or revoked sooner.       Influenza A by PCR NEGATIVE NEGATIVE Final   Influenza B by PCR NEGATIVE NEGATIVE Final    Comment: (NOTE) The Xpert Xpress SARS-CoV-2/FLU/RSV  plus assay is intended as an aid in the diagnosis of influenza from Nasopharyngeal swab specimens and should not be used as a sole basis for treatment. Nasal washings and aspirates are unacceptable for Xpert Xpress SARS-CoV-2/FLU/RSV testing.  Fact Sheet for Patients: EntrepreneurPulse.com.au  Fact Sheet for Healthcare Providers: IncredibleEmployment.be  This test is not yet approved or cleared by the [place] FDA and has been authorized for detection and/or diagnosis of SARS-CoV-2 by FDA under an Emergency Use Authorization (EUA). This EUA will remain in effect (meaning this test can be used) for the duration of the COVID-19 declaration under Section 564(b)(1) of the Act, 21 U.S.C. section 360bbb-3(b)(1), unless the authorization is terminated or revoked.  Performed at [facility], [address], [place], [place] [address]     Radiology Studies: ECHOCARDIOGRAM COMPLETE  Result Date: 04/09/2021    ECHOCARDIOGRAM REPORT   Patient Name:   MELLANY [facility]ORE [facility] Date of Exam: 04/09/2021 Medical Rec #:  [id number]   Height:       69.0 in Accession #:    [id number]  Weight:       201.9 lb Date of Birth:  14-Oct-1951    BSA:          2.075 m  Patient Age:    65 years    BP:           96/84 mmHg Patient Gender: F           HR:           109 bpm. Exam Location:  [facility] Procedure: 2D Echo, Cardiac Doppler and Color Doppler Indications:    CVA  History:        Patient has prior history of Echocardiogram examinations, most                 recent 10/07/2017. Stroke and COPD; Risk Factors:Current Smoker,                 Hypertension, Diabetes and Dyslipidemia. Lung mass with Brain                 mets.  Sonographer:    Dustin Flock RDCS Referring Phys: [id number] ASIA B [place]  1. Left ventricular ejection fraction, by estimation, is 65 to 70%. The left ventricle has normal function. The left ventricle has no regional wall motion abnormalities. There is mild left ventricular hypertrophy. Left ventricular diastolic parameters are indeterminate.  2. Right ventricular systolic function is normal. The right ventricular size is normal. There is normal pulmonary artery systolic pressure. The estimated right ventricular systolic pressure is 51.8 mmHg.  3. Small to moderate pericardial effusion. The pericardial effusion is posterior to the left ventricle, lateral to the left ventricle and anterior to the right ventricle. There is no evidence of cardiac tamponade. Fibrinous material noted suggesting relative chronicity.  4. The mitral valve is grossly normal. Trivial mitral valve regurgitation.  5. The aortic valve is tricuspid. Aortic valve regurgitation is not visualized.  6. The inferior vena cava is dilated in size with >50% respiratory variability, suggesting right atrial pressure of 8 mmHg. FINDINGS  Left Ventricle: Left ventricular ejection fraction, by estimation, is 65 to 70%. The left ventricle has normal function. The left ventricle has no regional wall motion abnormalities. The left ventricular internal cavity size was normal in size. There is  mild left ventricular hypertrophy. Left ventricular diastolic parameters are indeterminate.  Right Ventricle: The right ventricular size is normal. No increase in right ventricular wall  thickness. Right ventricular systolic function is normal. There is normal pulmonary artery systolic pressure. The tricuspid regurgitant velocity is 1.71 m/s, and  with an assumed right atrial pressure of 8 mmHg, the estimated right ventricular systolic pressure is 74.8 mmHg. Left Atrium: Left atrial size was normal in size. Right Atrium: Right atrial size was normal in size. Pericardium: A moderately sized pericardial effusion is present. The pericardial effusion is posterior to the left ventricle, lateral to the left ventricle and anterior to the right ventricle. There is no evidence of cardiac tamponade. Mitral Valve: The mitral valve is grossly normal. Trivial mitral valve regurgitation. Tricuspid Valve: The tricuspid valve is grossly normal. Tricuspid valve regurgitation is trivial. Aortic Valve: The aortic valve is tricuspid. There is mild aortic valve annular calcification. Aortic valve regurgitation is not visualized. Pulmonic Valve: The pulmonic valve was grossly normal. Pulmonic valve regurgitation is trivial. Aorta: The aortic root is normal in size and structure. Venous: The inferior vena cava is dilated in size with greater than 50% respiratory variability, suggesting right atrial pressure of 8 mmHg. IAS/Shunts: The interatrial septum appears to be lipomatous. No atrial level shunt detected by color flow Doppler.  LEFT VENTRICLE PLAX 2D LVIDd:         4.16 cm  Diastology LVIDs:         2.41 cm  LV e' medial:    6.20 cm/s LV PW:         1.16 cm  LV E/e' medial:  11.6 LV IVS:        1.21 cm  LV e' lateral:   4.46 cm/s LVOT diam:     2.00 cm  LV E/e' lateral: 16.1 LV SV:         67 LV SV Index:   32 LVOT Area:     3.14 cm  RIGHT VENTRICLE RV Basal diam:  2.95 cm RV S prime:     11.30 cm/s TAPSE (M-mode): 2.7 cm LEFT ATRIUM             Index       RIGHT ATRIUM           Index LA diam:        3.30 cm 1.59 cm/m  RA  Area:     12.70 cm LA Vol (A2C):   39.2 ml 18.90 ml/m RA Volume:   29.20 ml  14.08 ml/m LA Vol (A4C):   28.8 ml 13.88 ml/m LA Biplane Vol: 34.3 ml 16.53 ml/m  AORTIC VALVE LVOT Vmax:   118.00 cm/s LVOT Vmean:  78.500 cm/s LVOT VTI:    0.212 m  AORTA Ao Root diam: 2.50 cm MITRAL VALVE               TRICUSPID VALVE MV Area (PHT): 3.91 cm    TR Peak grad:   11.7 mmHg MV Decel Time: 194 msec    TR Vmax:        171.00 cm/s MV E velocity: 72.00 cm/s MV A velocity: 88.30 cm/s  SHUNTS MV E/A ratio:  0.82        Systemic VTI:  0.21 m                            Systemic Diam: 2.00 cm Rozann Lesches MD Electronically signed by Rozann Lesches MD Signature Date/Time: 04/09/2021/4:09:53 PM    Final       Emalyn Schou T. [place]  If 7PM-7AM, please contact  night-coverage [email or web address] 04/10/2021, 11:43 AM

## 2021-04-10 NOTE — Progress Notes (Addendum)
NT Tammy Blair was in taking patient's vitals and had noticed patient's visitor/ significant other was still at bedside. She explained to him that visiting hours were over and that he had to leave. Visitor got upset and angry using verbal profanity, explaining that is he had to leave, then she (the patient) was leaving too. CN was notified and AC and security were called. AC spoke with visitor & he insisted that if he had to leave, the patient would leave too AMA. Due to patient's frail state and recent diagnosis, visitor was allowed to stay.

## 2021-04-11 ENCOUNTER — Ambulatory Visit: Payer: Medicare Other | Admitting: Radiation Oncology

## 2021-04-11 DIAGNOSIS — Z6829 Body mass index (BMI) 29.0-29.9, adult: Secondary | ICD-10-CM | POA: Diagnosis not present

## 2021-04-11 DIAGNOSIS — Z20822 Contact with and (suspected) exposure to covid-19: Secondary | ICD-10-CM | POA: Diagnosis not present

## 2021-04-11 DIAGNOSIS — F419 Anxiety disorder, unspecified: Secondary | ICD-10-CM | POA: Diagnosis not present

## 2021-04-11 DIAGNOSIS — I313 Pericardial effusion (noninflammatory): Secondary | ICD-10-CM | POA: Diagnosis not present

## 2021-04-11 DIAGNOSIS — I10 Essential (primary) hypertension: Secondary | ICD-10-CM | POA: Diagnosis not present

## 2021-04-11 DIAGNOSIS — Z825 Family history of asthma and other chronic lower respiratory diseases: Secondary | ICD-10-CM | POA: Diagnosis not present

## 2021-04-11 DIAGNOSIS — H9190 Unspecified hearing loss, unspecified ear: Secondary | ICD-10-CM | POA: Diagnosis not present

## 2021-04-11 DIAGNOSIS — E785 Hyperlipidemia, unspecified: Secondary | ICD-10-CM | POA: Diagnosis not present

## 2021-04-11 DIAGNOSIS — G131 Other systemic atrophy primarily affecting central nervous system in neoplastic disease: Secondary | ICD-10-CM | POA: Diagnosis not present

## 2021-04-11 DIAGNOSIS — W1830XA Fall on same level, unspecified, initial encounter: Secondary | ICD-10-CM | POA: Diagnosis present

## 2021-04-11 DIAGNOSIS — F17210 Nicotine dependence, cigarettes, uncomplicated: Secondary | ICD-10-CM | POA: Diagnosis not present

## 2021-04-11 DIAGNOSIS — R531 Weakness: Secondary | ICD-10-CM | POA: Diagnosis present

## 2021-04-11 DIAGNOSIS — J449 Chronic obstructive pulmonary disease, unspecified: Secondary | ICD-10-CM | POA: Diagnosis not present

## 2021-04-11 DIAGNOSIS — G936 Cerebral edema: Secondary | ICD-10-CM | POA: Diagnosis not present

## 2021-04-11 DIAGNOSIS — J019 Acute sinusitis, unspecified: Secondary | ICD-10-CM | POA: Diagnosis not present

## 2021-04-11 DIAGNOSIS — G8191 Hemiplegia, unspecified affecting right dominant side: Secondary | ICD-10-CM | POA: Diagnosis not present

## 2021-04-11 DIAGNOSIS — Y92009 Unspecified place in unspecified non-institutional (private) residence as the place of occurrence of the external cause: Secondary | ICD-10-CM | POA: Diagnosis not present

## 2021-04-11 DIAGNOSIS — C3411 Malignant neoplasm of upper lobe, right bronchus or lung: Secondary | ICD-10-CM | POA: Diagnosis not present

## 2021-04-11 DIAGNOSIS — M5416 Radiculopathy, lumbar region: Secondary | ICD-10-CM | POA: Diagnosis not present

## 2021-04-11 DIAGNOSIS — J9610 Chronic respiratory failure, unspecified whether with hypoxia or hypercapnia: Secondary | ICD-10-CM | POA: Diagnosis not present

## 2021-04-11 DIAGNOSIS — I639 Cerebral infarction, unspecified: Secondary | ICD-10-CM | POA: Diagnosis not present

## 2021-04-11 DIAGNOSIS — C7931 Secondary malignant neoplasm of brain: Secondary | ICD-10-CM | POA: Diagnosis not present

## 2021-04-11 DIAGNOSIS — T380X5A Adverse effect of glucocorticoids and synthetic analogues, initial encounter: Secondary | ICD-10-CM | POA: Diagnosis not present

## 2021-04-11 DIAGNOSIS — G8929 Other chronic pain: Secondary | ICD-10-CM | POA: Diagnosis not present

## 2021-04-11 DIAGNOSIS — Z7401 Bed confinement status: Secondary | ICD-10-CM | POA: Diagnosis not present

## 2021-04-11 DIAGNOSIS — E1165 Type 2 diabetes mellitus with hyperglycemia: Secondary | ICD-10-CM | POA: Diagnosis not present

## 2021-04-11 DIAGNOSIS — E663 Overweight: Secondary | ICD-10-CM | POA: Diagnosis not present

## 2021-04-11 DIAGNOSIS — E876 Hypokalemia: Secondary | ICD-10-CM | POA: Diagnosis not present

## 2021-04-11 LAB — RENAL FUNCTION PANEL
Albumin: 3.6 g/dL (ref 3.5–5.0)
Anion gap: 10 (ref 5–15)
BUN: 22 mg/dL (ref 8–23)
CO2: 31 mmol/L (ref 22–32)
Calcium: 9.3 mg/dL (ref 8.9–10.3)
Chloride: 94 mmol/L — ABNORMAL LOW (ref 98–111)
Creatinine, Ser: 0.6 mg/dL (ref 0.44–1.00)
GFR, Estimated: >60 mL/min (ref >60)
Glucose, Bld: 188 mg/dL — ABNORMAL HIGH (ref 70–99)
Phosphorus: 3.4 mg/dL (ref 2.5–4.6)
Potassium: 3.4 mmol/L — ABNORMAL LOW (ref 3.5–5.1)
Sodium: 135 mmol/L (ref 135–145)

## 2021-04-11 LAB — CBC
HCT: 42.1 % (ref 36.0–46.0)
Hemoglobin: 14.1 g/dL (ref 12.0–15.0)
MCH: 31.3 pg (ref 26.0–34.0)
MCHC: 33.5 g/dL (ref 30.0–36.0)
MCV: 93.6 fL (ref 80.0–100.0)
Platelets: 277 10*3/uL (ref 150–400)
RBC: 4.5 MIL/uL (ref 3.87–5.11)
RDW: 12.2 % (ref 11.5–15.5)
WBC: 22.6 10*3/uL — ABNORMAL HIGH (ref 4.0–10.5)
nRBC: 0 % (ref 0.0–0.2)

## 2021-04-11 LAB — GLUCOSE, CAPILLARY
Glucose-Capillary: 205 mg/dL — ABNORMAL HIGH (ref 70–99)
Glucose-Capillary: 180 mg/dL — ABNORMAL HIGH (ref 70–99)
Glucose-Capillary: 327 mg/dL — ABNORMAL HIGH (ref 70–99)
Glucose-Capillary: 252 mg/dL — ABNORMAL HIGH (ref 70–99)

## 2021-04-11 LAB — MAGNESIUM: Magnesium: 2 mg/dL (ref 1.7–2.4)

## 2021-04-11 MED ORDER — ALPRAZOLAM 0.5 MG PO TABS
0.5000 mg | ORAL_TABLET | Freq: Three times a day (TID) | ORAL | Status: DC | PRN
  Administered 2021-04-11 – 2021-04-14 (×8): 0.5 mg via ORAL
  Filled 2021-04-11 (×9): qty 1

## 2021-04-11 MED ORDER — INSULIN ASPART 100 UNIT/ML IJ SOLN
6.0000 [IU] | Freq: Three times a day (TID) | INTRAMUSCULAR | Status: DC
  Administered 2021-04-11 – 2021-04-12 (×4): 6 [IU] via SUBCUTANEOUS

## 2021-04-11 MED ORDER — ACETAMINOPHEN 500 MG PO TABS
1000.0000 mg | ORAL_TABLET | Freq: Three times a day (TID) | ORAL | Status: DC
  Administered 2021-04-11 – 2021-04-14 (×8): 1000 mg via ORAL
  Filled 2021-04-11 (×8): qty 2

## 2021-04-11 MED ORDER — INSULIN GLARGINE 100 UNIT/ML ~~LOC~~ SOLN
10.0000 [IU] | Freq: Two times a day (BID) | SUBCUTANEOUS | Status: DC
  Administered 2021-04-11 – 2021-04-12 (×3): 10 [IU] via SUBCUTANEOUS
  Filled 2021-04-11 (×3): qty 0.1

## 2021-04-11 MED ORDER — ATORVASTATIN CALCIUM 40 MG PO TABS
40.0000 mg | ORAL_TABLET | Freq: Every day | ORAL | Status: DC
  Administered 2021-04-11 – 2021-04-14 (×3): 40 mg via ORAL
  Filled 2021-04-11 (×4): qty 1

## 2021-04-11 NOTE — Progress Notes (Signed)
Patient sitting up in chair with alarm on.

## 2021-04-11 NOTE — Progress Notes (Signed)
PROGRESS NOTE  Tammy Blair:[id number] DOB: 29-Oct-1951   PCP: [name], MD  Patient is from: Home.  Lives with husband.  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    Initially, concern about CVA.  CT head showed moderate to large area of low density in the left frontal lobe concerning for CVA.  However, MRI brain negative for CVA but  multiple intracranial lesions with surrounding vasogenic edema compatible with metastatic disease.  CT chest/abdomen/pelvis showed 3.7 cm RUL lung mass with extensive thoracic and some lower neck adenopathy concerning for bronchogenic carcinoma with mets to brain.  CT also showed moderate pericardial effusion.  Oncology consulted and started patient on Decadron for vasogenic edema.  Palliative medicine and pulmonology consulted as well.  Patient transferred to [facility] for radiation oncology, which is planned to start on 04/12/2021.  Pulmonology planning bronchoscopy/ biopsy on 04/13/2021.  Therapy recommended CIR.  Subjective: Seen and examined earlier this morning.  No major events overnight of this morning.  She complains low back pain which is chronic for her.  She is asking if her oxycodone could be increased to 10 mg.  She is not on oxycodone at home.  She also asked if her Xanax could be increased to 0.5 mg.  Still with right upper arm weakness.  Objective: Vitals:   04/11/21 0808 04/11/21 1129 04/11/21 1132 04/11/21 1134  BP:  (!) 149/65 (!) 149/65 (!) 149/65  Pulse:  75  75  Resp:      Temp:  98.3 F (36.8 C)    TempSrc:  Oral    SpO2: 99% (!) 87%    Weight:      Height:        Intake/Output Summary (Last 24 hours) at  04/11/2021 1231 Last data filed at 04/11/2021 0400 Gross per 24 hour  Intake 360 ml  Output --  Net 360 ml   Filed Weights   04/07/21 1923  Weight: 91.6 kg    Examination:  GENERAL: No apparent distress.  Nontoxic. HEENT: MMM.  Vision grossly intact.  Diminished hearing. NECK: Supple.  No apparent JVD.  RESP: On RA.  No IWOB.  Fair aeration bilaterally. CVS:  RRR. Heart sounds normal.  ABD/GI/GU: BS+. Abd soft, NTND.  MSK/EXT:  Moves extremities. No apparent deformity. No edema.  SKIN: no apparent skin lesion or wound NEURO: Awake, alert and oriented appropriately.  No focal neuro deficit other than RUE weakness PSYCH: Appears anxious.  Procedures:  None  Microbiology summarized: [id number] and influenza PCR nonreactive.  Assessment & Plan: Lung cancer with brain metastasis and intrathoracic lymphadenopathy-history of heavy tobacco use -Plan for radiation therapy starting on 04/12/2021 -Continue Decadron 4 mg every 6 hours for vasogenic edema -Plan for bronchoscopy/lung biopsy by pulmonology on 04/13/2021 -Appreciate help by palliative medicine  Right arm weakness: Likely due to the above.  MRI negative for CVA -Continue therapy-recommended CIR.  Fall at home: Likely due to the above.  Did not hit her head or lose consciousness.  No traumatic injury. -Continue therapy as above -Continue fall precaution  Mild to moderate pericardial effusion: Likely due to malignancy.  No clinical signs of tamponade.  Chronic COPD/RF-on 3 L  at baseline.  Now saturating well on room air. -On Decadron for vasogenic edema -Continue breathing treatments  Chronic low back pain/lumbar radiculopathy: No acute neuro findings in the lower extremities.  Asking for oxycodone to be increased although she is not on oxycodone at home.  She refuses to take Tylenol saying  her PCP advised her not to take.  Her LFTs normal. -Scheduled Tylenol and gabapentin. -Continue as needed Flexeril and oxycodone at  current dose  Controlled NIDDM-2 with hyperglycemia and hyperlipidemia: A1c 6.3%.  Hyperglycemia likely due to steroid. Recent Labs  Lab 04/10/21 0731 04/10/21 1210 04/10/21 1703 04/10/21 2150 04/11/21 0730  GLUCAP 190* 243* 259* 258* 205*  -Continue SSI-moderate -Increase Lantus from 6 to 10 units twice daily -Increase mealtime coverage from 4 to 6 units 3 times daily -Further adjustment as appropriate. -Continue statin.  Hypokalemia: K3.4. -P.o. K-Dur 40 mEq x 1  Tobacco use disorder- -continue nicotine patch  Essential hypertension: Normotensive for most part. -Continue metoprolol and Cardizem  Sinusitis? -Continue p.o. Augmentin  Euthyroid syndrome-TSH mildly elevated.  Free T4 within normal.  Goal of care: Patient with new metastatic lung cancer, chronic COPD with respiratory failure.  Poor long-term prognosis.  Still full code which I believe would pose more harm than benefit -Palliative medicine following  Anxiety: Seems worse partly from steroid. -Increased Xanax to home dose (0.5 mg 3 times daily as needed).  Leukocytosis: Likely demargination from steroid.    Overweight Body mass index is 29.82 kg/m.         DVT prophylaxis:  heparin injection 5,000 Units Start: 04/08/21 0600 SCD's Start: 04/08/21 0058  Code Status: Full code Family Communication: Updated patient's husband at bedside Level of care: Telemetry Status is: Inpatient  Remains inpatient appropriate because:Ongoing diagnostic testing needed not appropriate for outpatient work up, Unsafe d/c plan, IV treatments appropriate due to intensity of illness or inability to take PO and Inpatient level of care appropriate due to severity of illness   Dispo: The patient is from: Home              Anticipated d/c is to: CIR              Patient currently is not medically stable to d/c.   Difficult to place patient No       Consultants:  Oncology Radiation  oncology Pulmonology Palliative medicine   Sch Meds:  Scheduled Meds: . acetaminophen  1,000 mg Oral Q8H  . amoxicillin-clavulanate  1 tablet Oral Q12H  . aspirin EC  81 mg Oral Daily  . [START ON 04/12/2021] atorvastatin  40 mg Oral Daily  . chlorthalidone  25 mg Oral Daily  . dexamethasone  4 mg Oral Q6H  . diltiazem  60 mg Oral BID  . famotidine  20 mg Oral Daily  . gabapentin  400 mg Oral TID  . heparin  5,000 Units Subcutaneous Q8H  . insulin aspart  0-15 Units Subcutaneous TID WC  . insulin aspart  0-5 Units Subcutaneous QHS  . insulin aspart  6 Units Subcutaneous TID WC  . insulin glargine  10 Units Subcutaneous BID  . metoprolol tartrate  100 mg Oral BID  . mometasone-formoterol  2 puff Inhalation BID  . nicotine  21 mg Transdermal Daily  . umeclidinium bromide  1 puff Inhalation Daily   Continuous Infusions: PRN Meds:.albuterol, ALPRAZolam, cyclobenzaprine, ondansetron (ZOFRAN) IV, oxyCODONE  Antimicrobials: Anti-infectives (From admission, onward)   Start     Dose/Rate Route Frequency Ordered Stop  04/08/21 0145  amoxicillin-clavulanate (AUGMENTIN) 875-125 MG per tablet 1 tablet        1 tablet Oral Every 12 hours 04/08/21 0045 04/14/21 2159       I have personally reviewed the following labs and images: CBC: Recent Labs  Lab 04/07/21 2107 04/11/21 0554  WBC 12.5* 22.6*  NEUTROABS 8.4*  --   HGB 15.0 14.1  HCT 46.0 42.1  MCV 96.8 93.6  PLT 301 277   BMP &GFR Recent Labs  Lab 04/07/21 2107 04/11/21 0554  NA 134* 135  K 3.3* 3.4*  CL 90* 94*  CO2 35* 31  GLUCOSE 111* 188*  BUN 18 22  CREATININE 0.63 0.60  CALCIUM 9.3 9.3  MG  --  2.0  PHOS  --  3.4   Estimated Creatinine Clearance: 81.2 mL/min (by C-G formula based on SCr of 0.6 mg/dL). Liver & Pancreas: Recent Labs  Lab 04/07/21 2107 04/11/21 0554  AST 27  --   ALT 28  --   ALKPHOS 58  --   BILITOT 0.5  --   PROT 8.2*  --   ALBUMIN 3.9 3.6   No results for input(s): LIPASE,  AMYLASE in the last 168 hours. No results for input(s): AMMONIA in the last 168 hours. Diabetic: No results for input(s): HGBA1C in the last 72 hours. Recent Labs  Lab 04/10/21 0731 04/10/21 1210 04/10/21 1703 04/10/21 2150 04/11/21 0730  GLUCAP 190* 243* 259* 258* 205*   Cardiac Enzymes: No results for input(s): CKTOTAL, CKMB, CKMBINDEX, TROPONINI in the last 168 hours. No results for input(s): PROBNP in the last 8760 hours. Coagulation Profile: No results for input(s): INR, PROTIME in the last 168 hours. Thyroid Function Tests: Recent Labs    04/09/21 1324  FREET4 1.04  T3FREE 2.9   Lipid Profile: No results for input(s): CHOL, HDL, LDLCALC, TRIG, CHOLHDL, LDLDIRECT in the last 72 hours. Anemia Panel: No results for input(s): VITAMINB12, FOLATE, FERRITIN, TIBC, IRON, RETICCTPCT in the last 72 hours. Urine analysis:    Component Value Date/Time   COLORURINE YELLOW 04/08/2021 1600   APPEARANCEUR CLEAR 04/08/2021 1600   LABSPEC 1.012 04/08/2021 1600   PHURINE 6.0 04/08/2021 1600   GLUCOSEU NEGATIVE 04/08/2021 1600   HGBUR NEGATIVE 04/08/2021 1600   BILIRUBINUR NEGATIVE 04/08/2021 1600   KETONESUR NEGATIVE 04/08/2021 1600   PROTEINUR NEGATIVE 04/08/2021 1600   UROBILINOGEN 0.2 07/17/2014 1613   NITRITE NEGATIVE 04/08/2021 1600   LEUKOCYTESUR SMALL (A) 04/08/2021 1600   Sepsis Labs: Invalid input(s): PROCALCITONIN, [place]  Microbiology: Recent Results (from the past 240 hour(s))  Resp Panel by RT-PCR (Flu A&B, Covid) Nasopharyngeal Swab     Status: None   Collection Time: 04/07/21 11:02 PM   Specimen: Nasopharyngeal Swab; Nasopharyngeal(NP) swabs in vial transport medium  Result Value Ref Range Status   SARS Coronavirus 2 by RT PCR NEGATIVE NEGATIVE Final    Comment: (NOTE) SARS-CoV-2 target nucleic acids are NOT DETECTED.  The SARS-CoV-2 RNA is generally detectable in upper respiratory specimens during the acute phase of infection. The  lowest concentration of SARS-CoV-2 viral copies this assay can detect is 138 copies/mL. A negative result does not preclude SARS-Cov-2 infection and should not be used as the sole basis for treatment or other patient management decisions. A negative result may occur with  improper specimen collection/handling, submission of specimen other than nasopharyngeal swab, presence of viral mutation(s) within the areas targeted by this assay, and inadequate number of viral copies(<138 copies/mL). A negative result must be  combined with clinical observations, patient history, and epidemiological information. The expected result is Negative.  Fact Sheet for Patients:  EntrepreneurPulse.com.au  Fact Sheet for Healthcare Providers:  IncredibleEmployment.be  This test is no t yet approved or cleared by the [place] FDA and  has been authorized for detection and/or diagnosis of SARS-CoV-2 by FDA under an Emergency Use Authorization (EUA). This EUA will remain  in effect (meaning this test can be used) for the duration of the COVID-19 declaration under Section 564(b)(1) of the Act, 21 U.S.C.section 360bbb-3(b)(1), unless the authorization is terminated  or revoked sooner.       Influenza A by PCR NEGATIVE NEGATIVE Final   Influenza B by PCR NEGATIVE NEGATIVE Final    Comment: (NOTE) The Xpert Xpress SARS-CoV-2/FLU/RSV plus assay is intended as an aid in the diagnosis of influenza from Nasopharyngeal swab specimens and should not be used as a sole basis for treatment. Nasal washings and aspirates are unacceptable for Xpert Xpress SARS-CoV-2/FLU/RSV testing.  Fact Sheet for Patients: EntrepreneurPulse.com.au  Fact Sheet for Healthcare Providers: IncredibleEmployment.be  This test is not yet approved or cleared by the [place] FDA and has been authorized for detection and/or diagnosis of SARS-CoV-2 by FDA under  an Emergency Use Authorization (EUA). This EUA will remain in effect (meaning this test can be used) for the duration of the COVID-19 declaration under Section 564(b)(1) of the Act, 21 U.S.C. section 360bbb-3(b)(1), unless the authorization is terminated or revoked.  Performed at [facility], [address], [place], [place] [address]     Radiology Studies: No results found.    Nikitia Asbill T. [place]  If 7PM-7AM, please contact night-coverage [email or web address] 04/11/2021, 12:31 PM

## 2021-04-11 NOTE — Progress Notes (Signed)
PCCM:  Orders placed for bronchoscopy on 04/13/2021. Endoscopy will arrange transport to [facility].   Garner Nash, DO [facility] 04/11/2021 7:02 PM

## 2021-04-12 ENCOUNTER — Ambulatory Visit
Admit: 2021-04-12 | Discharge: 2021-04-12 | Disposition: A | Discharge status: Home / Self Care | Payer: Medicare Other | Attending: Radiation Oncology | Admitting: Radiation Oncology

## 2021-04-12 ENCOUNTER — Ambulatory Visit
Admission: RE | Admit: 2021-04-12 | Discharge: 2021-04-12 | Disposition: A | Discharge status: Home / Self Care | Payer: Medicare Other | Source: Ambulatory Visit | Attending: Radiation Oncology | Admitting: Radiation Oncology

## 2021-04-12 DIAGNOSIS — C3411 Malignant neoplasm of upper lobe, right bronchus or lung: Secondary | ICD-10-CM | POA: Diagnosis not present

## 2021-04-12 DIAGNOSIS — I639 Cerebral infarction, unspecified: Secondary | ICD-10-CM | POA: Diagnosis not present

## 2021-04-12 DIAGNOSIS — C7931 Secondary malignant neoplasm of brain: Secondary | ICD-10-CM

## 2021-04-12 DIAGNOSIS — Z510 Encounter for antineoplastic radiation therapy: Secondary | ICD-10-CM | POA: Insufficient documentation

## 2021-04-12 DIAGNOSIS — R531 Weakness: Secondary | ICD-10-CM | POA: Diagnosis not present

## 2021-04-12 DIAGNOSIS — E876 Hypokalemia: Secondary | ICD-10-CM | POA: Diagnosis not present

## 2021-04-12 LAB — RENAL FUNCTION PANEL
Albumin: 3.7 g/dL (ref 3.5–5.0)
Anion gap: 12 (ref 5–15)
BUN: 32 mg/dL — ABNORMAL HIGH (ref 8–23)
CO2: 33 mmol/L — ABNORMAL HIGH (ref 22–32)
Calcium: 9.3 mg/dL (ref 8.9–10.3)
Chloride: 94 mmol/L — ABNORMAL LOW (ref 98–111)
Creatinine, Ser: 0.65 mg/dL (ref 0.44–1.00)
GFR, Estimated: >60 mL/min (ref >60)
Glucose, Bld: 223 mg/dL — ABNORMAL HIGH (ref 70–99)
Phosphorus: 3.3 mg/dL (ref 2.5–4.6)
Potassium: 3.5 mmol/L (ref 3.5–5.1)
Sodium: 139 mmol/L (ref 135–145)

## 2021-04-12 LAB — CBC WITH DIFFERENTIAL/PLATELET
Abs Immature Granulocytes: 0.21 10*3/uL — ABNORMAL HIGH (ref 0.00–0.07)
Basophils Absolute: 0 10*3/uL (ref 0.0–0.1)
Basophils Relative: 0 %
Eosinophils Absolute: 0 10*3/uL (ref 0.0–0.5)
Eosinophils Relative: 0 %
HCT: 44.1 % (ref 36.0–46.0)
Hemoglobin: 14.7 g/dL (ref 12.0–15.0)
Immature Granulocytes: 1 %
Lymphocytes Relative: 6 %
Lymphs Abs: 1 10*3/uL (ref 0.7–4.0)
MCH: 31.6 pg (ref 26.0–34.0)
MCHC: 33.3 g/dL (ref 30.0–36.0)
MCV: 94.8 fL (ref 80.0–100.0)
Monocytes Absolute: 0.9 10*3/uL (ref 0.1–1.0)
Monocytes Relative: 5 %
Neutro Abs: 16.3 10*3/uL — ABNORMAL HIGH (ref 1.7–7.7)
Neutrophils Relative %: 88 %
Platelets: 292 10*3/uL (ref 150–400)
RBC: 4.65 MIL/uL (ref 3.87–5.11)
RDW: 12.6 % (ref 11.5–15.5)
WBC: 18.5 10*3/uL — ABNORMAL HIGH (ref 4.0–10.5)
nRBC: 0 % (ref 0.0–0.2)

## 2021-04-12 LAB — MAGNESIUM: Magnesium: 2.2 mg/dL (ref 1.7–2.4)

## 2021-04-12 LAB — GLUCOSE, CAPILLARY
Glucose-Capillary: 258 mg/dL — ABNORMAL HIGH (ref 70–99)
Glucose-Capillary: 191 mg/dL — ABNORMAL HIGH (ref 70–99)
Glucose-Capillary: 219 mg/dL — ABNORMAL HIGH (ref 70–99)
Glucose-Capillary: 296 mg/dL — ABNORMAL HIGH (ref 70–99)

## 2021-04-12 MED ORDER — POTASSIUM CHLORIDE CRYS ER 20 MEQ PO TBCR
40.0000 meq | EXTENDED_RELEASE_TABLET | Freq: Once | ORAL | Status: AC
  Administered 2021-04-12: 40 meq via ORAL
  Filled 2021-04-12: qty 2

## 2021-04-12 MED ORDER — INSULIN ASPART 100 UNIT/ML IJ SOLN
0.0000 [IU] | Freq: Every day | INTRAMUSCULAR | Status: DC
  Administered 2021-04-12: 3 [IU] via SUBCUTANEOUS

## 2021-04-12 MED ORDER — INSULIN ASPART 100 UNIT/ML IJ SOLN
0.0000 [IU] | Freq: Three times a day (TID) | INTRAMUSCULAR | Status: DC
  Administered 2021-04-12: 4 [IU] via SUBCUTANEOUS
  Administered 2021-04-12: 7 [IU] via SUBCUTANEOUS
  Administered 2021-04-13: 11 [IU] via SUBCUTANEOUS
  Administered 2021-04-13: 7 [IU] via SUBCUTANEOUS
  Administered 2021-04-14: 3 [IU] via SUBCUTANEOUS
  Administered 2021-04-14: 11 [IU] via SUBCUTANEOUS

## 2021-04-12 MED ORDER — INSULIN GLARGINE 100 UNIT/ML ~~LOC~~ SOLN
15.0000 [IU] | Freq: Two times a day (BID) | SUBCUTANEOUS | Status: DC
  Administered 2021-04-12 – 2021-04-14 (×3): 15 [IU] via SUBCUTANEOUS
  Filled 2021-04-12 (×4): qty 0.15

## 2021-04-12 MED ORDER — DILTIAZEM HCL ER COATED BEADS 240 MG PO CP24: 240.0000 mg | ORAL_CAPSULE | Freq: Every day | ORAL | Status: DC

## 2021-04-12 MED ORDER — INSULIN ASPART 100 UNIT/ML IJ SOLN
8.0000 [IU] | Freq: Three times a day (TID) | INTRAMUSCULAR | Status: DC
  Administered 2021-04-12 – 2021-04-14 (×5): 8 [IU] via SUBCUTANEOUS

## 2021-04-12 MED ORDER — DILTIAZEM HCL 30 MG PO TABS
60.0000 mg | ORAL_TABLET | Freq: Once | ORAL | Status: AC
  Administered 2021-04-12: 30 mg via ORAL
  Filled 2021-04-12: qty 2

## 2021-04-12 MED ORDER — DILTIAZEM HCL ER COATED BEADS 240 MG PO CP24
240.0000 mg | ORAL_CAPSULE | Freq: Every day | ORAL | Status: DC
  Administered 2021-04-13: 240 mg via ORAL
  Filled 2021-04-12: qty 1

## 2021-04-12 MED ORDER — DILTIAZEM HCL 30 MG PO TABS
30.0000 mg | ORAL_TABLET | Freq: Four times a day (QID) | ORAL | Status: DC | PRN
  Administered 2021-04-12: 30 mg via ORAL
  Filled 2021-04-12 (×2): qty 1

## 2021-04-12 NOTE — Progress Notes (Signed)
Physical Therapy Treatment Patient Details Name: Tammy Blair MRN: [id number] DOB: 04-21-1951 Today's Date: 04/12/2021    History of Present Illness Ahsley Blair  is a 70 y.o. female, with history of lumbar radiculopathy, hypertension, diabetes mellitus without complication, COPD with chronic respiratory failure requiring 2.5 to 3 L nasal cannula continuously, anxiety and more presents to the ED with a chief complaint of right arm weakness for ~ 3 weeks per sister, sustained a fall. MRI brain negative for CVA but  multiple intracranial lesions with surrounding vasogenic edema compatible with metastatic disease.  CT chest/abdomen/pelvis showed 3.7 cm RUL lung mass with extensive thoracic and some lower neck adenopathy concerning for bronchogenic carcinoma with mets to brain    PT Comments    The patient is very HOH. Patient  transferred to [facility] with min guar, multimodal cues for safety. Patient ambulated x 20' using hemiwalker, trunk flexed, R knee flexed, RUE very flaccid and supported. Noted decreased ability to advance the right leg duering swing and decreased support in stance with knee noted to be flexed each step. Patient can benefit from a sling to protect RUE, noted to be caught under her after return to supine.  patient has a WC per patient.     Continue PT for mobility/safety.  Follow Up Recommendations  CIR vs HHPT?OPPT     Equipment Recommendations   Photographer)    Recommendations for Other Services       Precautions / Restrictions Precautions Precautions: Fall Precaution Comments: right UE flaccid Required Braces or Orthoses: Other Brace Other Brace: could benefit from a sling due to flaccid RUE Restrictions Other Position/Activity Restrictions: very HOH    Mobility  Bed Mobility   Bed Mobility: Supine to Sit;Sit to Supine     Supine to sit: Supervision Sit to supine: Supervision   General bed mobility comments: cues for safety, to wait for rail to be moved     Transfers Overall transfer level: Needs assistance Equipment used: Hemi-walker Transfers: Sit to/from [employer] Transfers Sit to Stand: Min guard;Min assist Stand pivot transfers: Min guard;Min assist       General transfer comment: Patient very hard of hearing - tactile and gestural cues for sequencing of steps and placement of left hand and hemi-walker  Ambulation/Gait Ambulation/Gait assistance: Min assist Gait Distance (Feet): 20 Feet Assistive device: Hemi-walker Gait Pattern/deviations: Step-through pattern;Decreased step length - right;Decreased step length - left;Narrow base of support;Decreased stride length;Trunk flexed Gait velocity: decreased   General Gait Details: Patient very hard of hearing - tactile and gestural cues for sequencing of steps and placement of left hand and hemi-walker; slow, somewhat labored gait using hemi-walker on left with right upper extremity flaccid/hanging; Trunk very forward flexed, R knee remains flexed each step. support provided [employer] [place] (Stroke Patients Only)       Balance Overall balance assessment: History of Falls;Needs assistance Sitting-balance support: Feet supported;No upper extremity supported Sitting balance-Leahy Scale: Fair Sitting balance - Comments: fair/good seated at EOB   Standing balance support: During functional activity;No upper extremity supported Standing balance-Leahy Scale: Poor Standing balance comment: fair/poor using hemi-walker                            Cognition Arousal/Alertness: Awake/alert Behavior During Therapy: WFL for tasks assessed/performed;Impulsive Overall Cognitive Status: Within Functional Limits for tasks assessed  Exercises      General Comments        Pertinent Vitals/Pain Pain Assessment: No/denies pain    Home Living                       Prior Function            PT Goals (current goals can now be found in the care plan section) Progress towards PT goals: Progressing toward goals    Frequency    Min 3X/week      PT Plan Current plan remains appropriate;Frequency needs to be updated    Co-evaluation              AM-PAC PT "6 Clicks" Mobility   Outcome Measure  Help needed turning from your back to your side while in a flat bed without using bedrails?: None Help needed moving from lying on your back to sitting on the side of a flat bed without using bedrails?: None Help needed moving to and from a bed to a chair (including a wheelchair)?: A Lot Help needed standing up from a chair using your arms (e.g., wheelchair or bedside chair)?: A Lot Help needed to walk in hospital room?: A Lot Help needed climbing 3-5 steps with a railing? : Total 6 Click Score: 15    End of Session Equipment Utilized During Treatment: Oxygen Activity Tolerance: Patient tolerated treatment well Patient left: with call bell/phone within reach;in bed;with family/visitor present Nurse Communication: Mobility status PT Visit Diagnosis: Unsteadiness on feet (R26.81);Other abnormalities of gait and mobility (R26.89);Muscle weakness (generalized) (M62.81)     Time: 7425-9563 PT Time Calculation (min) (ACUTE ONLY): 33 min  Charges:  $Gait Training: 8-22 mins $Self Care/Home Management: [place] Pager [phone] Office [phone]    Claretha Cooper 04/12/2021, 12:47 PM

## 2021-04-12 NOTE — Progress Notes (Signed)
  Radiation Oncology         ([phone]) [phone] ________________________________  Name: Tammy Blair MRN: [id number]  Date: 04/12/2021  DOB: [birth year]  INPATIENT  SIMULATION AND TREATMENT PLANNING NOTE    ICD-10-CM   1. 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  Identity was confirmed.  All relevant records and images related to the planned course of therapy were reviewed.  The patient freely provided informed written consent to proceed with treatment after reviewing the details related to the planned course of therapy. The consent form was witnessed and verified by the simulation staff.  Then, the patient was set-up in a stable reproducible  supine position for radiation therapy.  CT images were obtained.  Surface markings were placed.  The CT images were loaded into the planning software.  Then the target and avoidance structures were contoured.  Treatment planning then occurred.  The radiation prescription was entered and confirmed.  Then, I designed and supervised the construction of a total of 3 medically necessary complex treatment device including a custom made thermoplastic mask used for immobilization, and two MLC collimator apertures for radiotherapy from the right and left side, with independent collimation for each to account for beam divergence.  I have requested : Isodose Plan.    PLAN:  The whole brain will be treated to 30 Gy in 10 fractions.  ________________________________  Sheral Apley [name], M.D.

## 2021-04-12 NOTE — Progress Notes (Signed)
Inpatient Rehabilitation Admissions Coordinator  I spoke with patient 's daughter, [name] , by phone to follow up on our previous discussions when her Mom was at [facility]. Noted plans for biopsy tomorrow and then planned radiation. Patient requesting to go directly home when able to be released from [facility]. I will follow her progress, but patient unlikely to agree to CIR admit. Daughter is aware.  Danne Baxter, RN, MSN Rehab Admissions Coordinator [phone] 04/12/2021 2:40 PM

## 2021-04-12 NOTE — Plan of Care (Signed)
  Problem: Education: Goal: Knowledge of disease or condition will improve Outcome: Progressing Goal: Knowledge of secondary prevention will improve Outcome: Progressing Goal: Knowledge of patient specific risk factors addressed and post discharge goals established will improve Outcome: Progressing Goal: Individualized Educational Video(s) Outcome: Progressing   Problem: Coping: Goal: Will verbalize positive feelings about self Outcome: Progressing Goal: Will identify appropriate support needs Outcome: Progressing   Problem: Health Behavior/Discharge Planning: Goal: Ability to manage health-related needs will improve Outcome: Progressing   Problem: Self-Care: Goal: Ability to participate in self-care as condition permits will improve Outcome: Progressing Goal: Verbalization of feelings and concerns over difficulty with self-care will improve Outcome: Progressing Goal: Ability to communicate needs accurately will improve Outcome: Progressing   Problem: Nutrition: Goal: Dietary intake will improve Outcome: Progressing   Problem: Ischemic Stroke/TIA Tissue Perfusion: Goal: Complications of ischemic stroke/TIA will be minimized Outcome: Progressing

## 2021-04-12 NOTE — Progress Notes (Signed)
PROGRESS NOTE  Tammy Blair ZOX:[id number] DOB: 02-Jun-1951   PCP: [name], MD  Patient is from: Home.  Lives with husband.  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    Initially, concern about CVA.  CT head showed moderate to large area of low density in the left frontal lobe concerning for CVA.  However, MRI brain negative for CVA but  multiple intracranial lesions with surrounding vasogenic edema compatible with metastatic disease.  CT chest/abdomen/pelvis showed 3.7 cm RUL lung mass with extensive thoracic and some lower neck adenopathy concerning for bronchogenic carcinoma with mets to brain.  CT also showed moderate pericardial effusion.  Oncology consulted and started patient on Decadron for vasogenic edema.  Palliative medicine and pulmonology consulted as well.  Patient transferred to [facility] for radiation therapy that she started on 5/2.  Pulmonology planning bronchoscopy/biopsy at [facility] on 04/13/2021.  Therapy recommended CIR but patient would like to go home with home health  Subjective: Seen and examined earlier this morning.  No major events overnight or this morning.  Husband at bedside.  He says she had a good sleep last night.  Again, she asked about pain medication for back pain.  Her back pain is chronic.  She is not on opiate medication at home.  She was started on oxycodone this admission.   I encouraged her to take Tylenol around-the-clock with oxycodone as needed. I explained to her and her husband the reason why we are not increasing her oxycodone.   Objective: Vitals:   04/11/21 2026 04/11/21 2358 04/12/21 0537 04/12/21 1259  BP: 136/60  137/63 137/71 129/61  Pulse: (!) 58 65 (!) 51 (!) 49  Resp: 14 14 14 19   Temp: 97.7 F (36.5 C) 98.4 F (36.9 C) 98.4 F (36.9 C) 98 F (36.7 C)  TempSrc: Oral Oral Oral Oral  SpO2: 98% 95% 96% 97%  Weight:      Height:        Intake/Output Summary (Last 24 hours) at 04/12/2021 1310 Last data filed at 04/12/2021 0848 Gross per 24 hour  Intake 720 ml  Output --  Net 720 ml   Filed Weights   04/07/21 1923  Weight: 91.6 kg    Examination  GENERAL: No apparent distress.  Nontoxic. HEENT: MMM.  Vision and hearing grossly intact.  NECK: Supple.  No apparent JVD.  RESP: On RA.  No IWOB.  Fair aeration bilaterally. CVS:  RRR. Heart sounds normal.  ABD/GI/GU: BS+. Abd soft, NTND.  MSK/EXT:  Moves extremities. No apparent deformity. No edema.  SKIN: no apparent skin lesion or wound NEURO: Awake, alert and oriented fairly.  No apparent focal neuro deficit. PSYCH: Calm. Normal affect.   Procedures:  None  Microbiology summarized: [id number] and influenza PCR nonreactive.  Assessment & Plan: Lung cancer with brain metastasis and intrathoracic lymphadenopathy-history of heavy tobacco use -Plan for radiation therapy starting on 04/12/2021 -Continue Decadron 4 mg every 6 hours for vasogenic edema -Plan for bronchoscopy/lung biopsy at [facility] by pulmonology on 04/13/2021 -Appreciate help by palliative medicine  Right arm weakness: Likely due to the above.  MRI negative for CVA -Therapy recommended CIR but she prefers to go  home with home health.  Fall at home: Likely due to the above.  Did not hit her head or lose consciousness.  No traumatic injury. -Continue therapy as above -Continue fall precaution  Mild to moderate pericardial effusion: Likely due to malignancy.  No clinical signs of tamponade.  Chronic COPD/RF-on 3 L at baseline.  Now saturating well on room air. -On Decadron for vasogenic edema -Continue breathing treatments  Chronic low back pain/lumbar radiculopathy:  No acute neuro findings in the lower extremities.  Asking for oxycodone to be increased although she is not on oxycodone at home.  She refuses to take Tylenol saying  her PCP advised her not to take.  Her LFTs normal. -Scheduled Tylenol and gabapentin. -Continue as needed Flexeril and oxycodone at current dose.  No indication for increment  Controlled NIDDM-2 with hyperglycemia and hyperlipidemia: A1c 6.3%.  Hyperglycemia likely due to steroid. Recent Labs  Lab 04/11/21 1220 04/11/21 1802 04/11/21 2028 04/12/21 0817 04/12/21 1134  GLUCAP 180* 327* 252* 258* 191*  -Increase SSI from moderate to resistant -Increase Lantus from 10 to 15 units twice daily -Increase mealtime coverage from 6 to 8 units 3 times a day -Further adjustment as appropriate. -Continue statin.  Hypokalemia: K3.5 -P.o. K-Dur 40 mEq x 1  Tobacco use disorder- -continue nicotine patch  Essential hypertension/history of tachycardia: Normotensive.  HR in 40s to 50s -Discontinue metoprolol given COPD and chronic RF -Change Cardizem to Cardizem CD 240 mg daily -Cardizem 30 mg every 6 hours as needed tachycardia.  Sinusitis? -Continue p.o. Augmentin for total of 10 days  Euthyroid syndrome-TSH mildly elevated.  Free T4 within normal.  Goal of care: Patient with new metastatic lung cancer, chronic COPD with respiratory failure.  Poor long-term prognosis.  Still full code which I believe would pose more harm than benefit -Palliative medicine following  Anxiety: Seems worse partly from steroid. -Continue Xanax 0.5 mg 3 times daily as needed.  Leukocytosis: Likely demargination from steroid.  Improved -Continue monitoring  Overweight Body mass index is 29.82 kg/m.         DVT prophylaxis:  heparin injection 5,000 Units Start: 04/08/21 0600 SCD's Start: 04/08/21 0058  Code Status: Full code Family Communication: Updated patient's husband at bedside Level of care: Med-Surg Status is:  Inpatient  Remains inpatient appropriate because:Ongoing diagnostic testing needed not appropriate for outpatient work up, Unsafe d/c plan, IV treatments appropriate due to intensity of illness or inability to take PO and Inpatient level of care appropriate due to severity of illness   Dispo: The patient is from: Home              Anticipated d/c is to: CIR              Patient currently is not medically stable to d/c.   Difficult to place patient No       Consultants:  Oncology Radiation oncology Pulmonology Palliative medicine   Sch Meds:  Scheduled Meds: . acetaminophen  1,000 mg Oral Q8H  . amoxicillin-clavulanate  1 tablet Oral Q12H  . aspirin EC  81 mg Oral Daily  . atorvastatin  40 mg Oral Daily  . chlorthalidone  25 mg Oral Daily  . dexamethasone  4 mg Oral Q6H  . diltiazem  60 mg Oral BID  . famotidine  20 mg Oral Daily  . gabapentin  400 mg Oral TID  . heparin  5,000 Units Subcutaneous Q8H  . insulin aspart  0-20 Units Subcutaneous TID WC  . insulin  aspart  0-5 Units Subcutaneous QHS  . insulin aspart  8 Units Subcutaneous TID WC  . insulin glargine  15 Units Subcutaneous BID  . metoprolol tartrate  100 mg Oral BID  . mometasone-formoterol  2 puff Inhalation BID  . nicotine  21 mg Transdermal Daily  . umeclidinium bromide  1 puff Inhalation Daily   Continuous Infusions: PRN Meds:.albuterol, ALPRAZolam, cyclobenzaprine, ondansetron (ZOFRAN) IV, oxyCODONE  Antimicrobials: Anti-infectives (From admission, onward)   Start     Dose/Rate Route Frequency Ordered Stop   04/08/21 0145  amoxicillin-clavulanate (AUGMENTIN) 875-125 MG per tablet 1 tablet        1 tablet Oral Every 12 hours 04/08/21 0045 04/14/21 2159       I have personally reviewed the following labs and images: CBC: Recent Labs  Lab 04/07/21 2107 04/11/21 0554 04/12/21 0553  WBC 12.5* 22.6* 18.5*  NEUTROABS 8.4*  --  16.3*  HGB 15.0 14.1 14.7  HCT 46.0 42.1 44.1  MCV 96.8 93.6 94.8   PLT 301 277 292   BMP &GFR Recent Labs  Lab 04/07/21 2107 04/11/21 0554 04/12/21 0553  NA 134* 135 139  K 3.3* 3.4* 3.5  CL 90* 94* 94*  CO2 35* 31 33*  GLUCOSE 111* 188* 223*  BUN 18 22 32*  CREATININE 0.63 0.60 0.65  CALCIUM 9.3 9.3 9.3  MG  --  2.0 2.2  PHOS  --  3.4 3.3   Estimated Creatinine Clearance: 81.2 mL/min (by C-G formula based on SCr of 0.65 mg/dL). Liver & Pancreas: Recent Labs  Lab 04/07/21 2107 04/11/21 0554 04/12/21 0553  AST 27  --   --   ALT 28  --   --   ALKPHOS 58  --   --   BILITOT 0.5  --   --   PROT 8.2*  --   --   ALBUMIN 3.9 3.6 3.7   No results for input(s): LIPASE, AMYLASE in the last 168 hours. No results for input(s): AMMONIA in the last 168 hours. Diabetic: No results for input(s): HGBA1C in the last 72 hours. Recent Labs  Lab 04/11/21 1220 04/11/21 1802 04/11/21 2028 04/12/21 0817 04/12/21 1134  GLUCAP 180* 327* 252* 258* 191*   Cardiac Enzymes: No results for input(s): CKTOTAL, CKMB, CKMBINDEX, TROPONINI in the last 168 hours. No results for input(s): PROBNP in the last 8760 hours. Coagulation Profile: No results for input(s): INR, PROTIME in the last 168 hours. Thyroid Function Tests: Recent Labs    04/09/21 1324  FREET4 1.04  T3FREE 2.9   Lipid Profile: No results for input(s): CHOL, HDL, LDLCALC, TRIG, CHOLHDL, LDLDIRECT in the last 72 hours. Anemia Panel: No results for input(s): VITAMINB12, FOLATE, FERRITIN, TIBC, IRON, RETICCTPCT in the last 72 hours. Urine analysis:    Component Value Date/Time   COLORURINE YELLOW 04/08/2021 1600   APPEARANCEUR CLEAR 04/08/2021 1600   LABSPEC 1.012 04/08/2021 1600   PHURINE 6.0 04/08/2021 1600   GLUCOSEU NEGATIVE 04/08/2021 1600   HGBUR NEGATIVE 04/08/2021 1600   BILIRUBINUR NEGATIVE 04/08/2021 1600   KETONESUR NEGATIVE 04/08/2021 1600   PROTEINUR NEGATIVE 04/08/2021 1600   UROBILINOGEN 0.2 07/17/2014 1613   NITRITE NEGATIVE 04/08/2021 1600   LEUKOCYTESUR SMALL (A)  04/08/2021 1600   Sepsis Labs: Invalid input(s): PROCALCITONIN, [place]  Microbiology: Recent Results (from the past 240 hour(s))  Resp Panel by RT-PCR (Flu A&B, Covid) Nasopharyngeal Swab     Status: None   Collection Time: 04/07/21 11:02 PM   Specimen: Nasopharyngeal Swab; Nasopharyngeal(NP)  swabs in vial transport medium  Result Value Ref Range Status   SARS Coronavirus 2 by RT PCR NEGATIVE NEGATIVE Final    Comment: (NOTE) SARS-CoV-2 target nucleic acids are NOT DETECTED.  The SARS-CoV-2 RNA is generally detectable in upper respiratory specimens during the acute phase of infection. The lowest concentration of SARS-CoV-2 viral copies this assay can detect is 138 copies/mL. A negative result does not preclude SARS-Cov-2 infection and should not be used as the sole basis for treatment or other patient management decisions. A negative result may occur with  improper specimen collection/handling, submission of specimen other than nasopharyngeal swab, presence of viral mutation(s) within the areas targeted by this assay, and inadequate number of viral copies(<138 copies/mL). A negative result must be combined with clinical observations, patient history, and epidemiological information. The expected result is Negative.  Fact Sheet for Patients:  EntrepreneurPulse.com.au  Fact Sheet for Healthcare Providers:  IncredibleEmployment.be  This test is no t yet approved or cleared by the [place] FDA and  has been authorized for detection and/or diagnosis of SARS-CoV-2 by FDA under an Emergency Use Authorization (EUA). This EUA will remain  in effect (meaning this test can be used) for the duration of the COVID-19 declaration under Section 564(b)(1) of the Act, 21 U.S.C.section 360bbb-3(b)(1), unless the authorization is terminated  or revoked sooner.       Influenza A by PCR NEGATIVE NEGATIVE Final   Influenza B by PCR NEGATIVE NEGATIVE  Final    Comment: (NOTE) The Xpert Xpress SARS-CoV-2/FLU/RSV plus assay is intended as an aid in the diagnosis of influenza from Nasopharyngeal swab specimens and should not be used as a sole basis for treatment. Nasal washings and aspirates are unacceptable for Xpert Xpress SARS-CoV-2/FLU/RSV testing.  Fact Sheet for Patients: EntrepreneurPulse.com.au  Fact Sheet for Healthcare Providers: IncredibleEmployment.be  This test is not yet approved or cleared by the [place] FDA and has been authorized for detection and/or diagnosis of SARS-CoV-2 by FDA under an Emergency Use Authorization (EUA). This EUA will remain in effect (meaning this test can be used) for the duration of the COVID-19 declaration under Section 564(b)(1) of the Act, 21 U.S.C. section 360bbb-3(b)(1), unless the authorization is terminated or revoked.  Performed at [facility], [address] [place], [place] [address]     Radiology Studies: No results found.    Duaine Radin T. [place]  If 7PM-7AM, please contact night-coverage [email or web address] 04/12/2021, 1:10 PM

## 2021-04-13 ENCOUNTER — Inpatient Hospital Stay (HOSPITAL_COMMUNITY): Payer: Medicare Other | Admitting: Certified Registered Nurse Anesthetist

## 2021-04-13 ENCOUNTER — Encounter (HOSPITAL_COMMUNITY)
Admission: EM | Disposition: A | Discharge status: Home Health | Payer: Self-pay | Source: Home / Self Care | Attending: Student

## 2021-04-13 ENCOUNTER — Ambulatory Visit
Admit: 2021-04-13 | Discharge: 2021-04-13 | Disposition: A | Discharge status: Home / Self Care | Payer: Medicare Other | Attending: Radiation Oncology | Admitting: Radiation Oncology

## 2021-04-13 ENCOUNTER — Ambulatory Visit (HOSPITAL_COMMUNITY): Admit: 2021-04-13 | Payer: Medicare Other | Admitting: Pulmonary Disease

## 2021-04-13 ENCOUNTER — Encounter (HOSPITAL_COMMUNITY): Payer: Self-pay | Admitting: Family Medicine

## 2021-04-13 DIAGNOSIS — G936 Cerebral edema: Secondary | ICD-10-CM | POA: Diagnosis not present

## 2021-04-13 DIAGNOSIS — E871 Hypo-osmolality and hyponatremia: Secondary | ICD-10-CM

## 2021-04-13 DIAGNOSIS — R599 Enlarged lymph nodes, unspecified: Secondary | ICD-10-CM | POA: Insufficient documentation

## 2021-04-13 DIAGNOSIS — Z515 Encounter for palliative care: Secondary | ICD-10-CM | POA: Diagnosis not present

## 2021-04-13 DIAGNOSIS — R918 Other nonspecific abnormal finding of lung field: Secondary | ICD-10-CM | POA: Diagnosis not present

## 2021-04-13 DIAGNOSIS — E876 Hypokalemia: Secondary | ICD-10-CM | POA: Diagnosis not present

## 2021-04-13 DIAGNOSIS — C3411 Malignant neoplasm of upper lobe, right bronchus or lung: Secondary | ICD-10-CM | POA: Diagnosis not present

## 2021-04-13 DIAGNOSIS — C7931 Secondary malignant neoplasm of brain: Secondary | ICD-10-CM | POA: Diagnosis not present

## 2021-04-13 DIAGNOSIS — Z7189 Other specified counseling: Secondary | ICD-10-CM | POA: Diagnosis not present

## 2021-04-13 DIAGNOSIS — R531 Weakness: Secondary | ICD-10-CM | POA: Diagnosis not present

## 2021-04-13 DIAGNOSIS — I313 Pericardial effusion (noninflammatory): Secondary | ICD-10-CM | POA: Diagnosis not present

## 2021-04-13 DIAGNOSIS — I639 Cerebral infarction, unspecified: Secondary | ICD-10-CM | POA: Diagnosis not present

## 2021-04-13 HISTORY — PX: VIDEO BRONCHOSCOPY WITH ENDOBRONCHIAL ULTRASOUND: SHX6177

## 2021-04-13 HISTORY — PX: FINE NEEDLE ASPIRATION: SHX5430

## 2021-04-13 LAB — CBC
HCT: 44.4 % (ref 36.0–46.0)
Hemoglobin: 14.8 g/dL (ref 12.0–15.0)
MCH: 31.6 pg (ref 26.0–34.0)
MCHC: 33.3 g/dL (ref 30.0–36.0)
MCV: 94.7 fL (ref 80.0–100.0)
Platelets: 244 10*3/uL (ref 150–400)
RBC: 4.69 MIL/uL (ref 3.87–5.11)
RDW: 12.5 % (ref 11.5–15.5)
WBC: 18.3 10*3/uL — ABNORMAL HIGH (ref 4.0–10.5)
nRBC: 0 % (ref 0.0–0.2)

## 2021-04-13 LAB — RENAL FUNCTION PANEL
Albumin: 3.4 g/dL — ABNORMAL LOW (ref 3.5–5.0)
Anion gap: 10 (ref 5–15)
BUN: 29 mg/dL — ABNORMAL HIGH (ref 8–23)
CO2: 31 mmol/L (ref 22–32)
Calcium: 9.1 mg/dL (ref 8.9–10.3)
Chloride: 93 mmol/L — ABNORMAL LOW (ref 98–111)
Creatinine, Ser: 0.66 mg/dL (ref 0.44–1.00)
GFR, Estimated: >60 mL/min (ref >60)
Glucose, Bld: 169 mg/dL — ABNORMAL HIGH (ref 70–99)
Phosphorus: 3.3 mg/dL (ref 2.5–4.6)
Potassium: 4.1 mmol/L (ref 3.5–5.1)
Sodium: 134 mmol/L — ABNORMAL LOW (ref 135–145)

## 2021-04-13 LAB — MAGNESIUM: Magnesium: 2.2 mg/dL (ref 1.7–2.4)

## 2021-04-13 LAB — GLUCOSE, CAPILLARY
Glucose-Capillary: 220 mg/dL — ABNORMAL HIGH (ref 70–99)
Glucose-Capillary: 173 mg/dL — ABNORMAL HIGH (ref 70–99)
Glucose-Capillary: 276 mg/dL — ABNORMAL HIGH (ref 70–99)
Glucose-Capillary: 313 mg/dL — ABNORMAL HIGH (ref 70–99)
Glucose-Capillary: 190 mg/dL — ABNORMAL HIGH (ref 70–99)

## 2021-04-13 SURGERY — BRONCHOSCOPY, WITH EBUS
Anesthesia: General

## 2021-04-13 SURGERY — BRONCHOSCOPY, WITH EBUS
Anesthesia: General | Laterality: Bilateral

## 2021-04-13 MED ORDER — LORAZEPAM 2 MG/ML IJ SOLN
0.5000 mg | Freq: Once | INTRAMUSCULAR | Status: AC
  Administered 2021-04-13: 0.5 mg via INTRAVENOUS
  Filled 2021-04-13: qty 1

## 2021-04-13 MED ORDER — LACTATED RINGERS IV SOLN: INTRAVENOUS | Status: DC | PRN

## 2021-04-13 MED ORDER — ONDANSETRON HCL 4 MG/2ML IJ SOLN
INTRAMUSCULAR | Status: DC | PRN
  Administered 2021-04-13: 4 mg via INTRAVENOUS

## 2021-04-13 MED ORDER — CHLORHEXIDINE GLUCONATE 0.12 % MT SOLN
15.0000 mL | Freq: Once | OROMUCOSAL | Status: AC
  Filled 2021-04-13: qty 15

## 2021-04-13 MED ORDER — FENTANYL CITRATE (PF) 100 MCG/2ML IJ SOLN
INTRAMUSCULAR | Status: DC | PRN
  Administered 2021-04-13 (×2): 50 ug via INTRAVENOUS

## 2021-04-13 MED ORDER — CHLORHEXIDINE GLUCONATE 0.12 % MT SOLN
OROMUCOSAL | Status: AC
  Administered 2021-04-13: 15 mL via OROMUCOSAL
  Filled 2021-04-13: qty 15

## 2021-04-13 MED ORDER — DEXAMETHASONE SODIUM PHOSPHATE 10 MG/ML IJ SOLN
INTRAMUSCULAR | Status: DC | PRN
  Administered 2021-04-13: 5 mg via INTRAVENOUS

## 2021-04-13 MED ORDER — METOPROLOL TARTRATE 100 MG PO TABS
100.0000 mg | ORAL_TABLET | Freq: Once | ORAL | Status: AC
  Administered 2021-04-13: 100 mg via ORAL
  Filled 2021-04-13: qty 1

## 2021-04-13 MED ORDER — SUGAMMADEX SODIUM 200 MG/2ML IV SOLN
INTRAVENOUS | Status: DC | PRN
  Administered 2021-04-13: 200 mg via INTRAVENOUS

## 2021-04-13 MED ORDER — PROPOFOL 10 MG/ML IV BOLUS
INTRAVENOUS | Status: DC | PRN
  Administered 2021-04-13: 30 mg via INTRAVENOUS
  Administered 2021-04-13: 90 mg via INTRAVENOUS

## 2021-04-13 MED ORDER — LIDOCAINE 2% (20 MG/ML) 5 ML SYRINGE
INTRAMUSCULAR | Status: DC | PRN
  Administered 2021-04-13: 60 mg via INTRAVENOUS

## 2021-04-13 MED ORDER — METOPROLOL TARTRATE 50 MG PO TABS
ORAL_TABLET | ORAL | Status: AC
  Filled 2021-04-13: qty 2

## 2021-04-13 MED ORDER — ROCURONIUM BROMIDE 100 MG/10ML IV SOLN
INTRAVENOUS | Status: DC | PRN
  Administered 2021-04-13: 50 mg via INTRAVENOUS

## 2021-04-13 SURGICAL SUPPLY — 30 items

## 2021-04-13 NOTE — Progress Notes (Signed)
Awaiting transport.

## 2021-04-13 NOTE — Anesthesia Postprocedure Evaluation (Signed)
Anesthesia Post Note  Patient: JENNI THEW  Procedure(s) Performed: VIDEO BRONCHOSCOPY WITH ENDOBRONCHIAL ULTRASOUND (Bilateral ) FINE NEEDLE ASPIRATION (FNA) LINEAR     Patient location during evaluation: Cath Lab Anesthesia Type: General Level of consciousness: awake and alert Pain management: pain level controlled Vital Signs Assessment: post-procedure vital signs reviewed and stable Respiratory status: spontaneous breathing, nonlabored ventilation, respiratory function stable and patient connected to nasal cannula oxygen Cardiovascular status: blood pressure returned to baseline and stable Postop Assessment: no apparent nausea or vomiting Anesthetic complications: no   No complications documented.  Last Vitals:  Vitals:   04/13/21 1440 04/13/21 1450  BP: (!) 162/71 (!) 157/68  Pulse: 66 75  Resp: 17 20  Temp:    SpO2: (!) 88% 94%    Last Pain:  Vitals:   04/13/21 1450  TempSrc:   PainSc: 0-No pain                 Shamari Lofquist COKER

## 2021-04-13 NOTE — Anesthesia Procedure Notes (Signed)
Procedure Name: Intubation Date/Time: 04/13/2021 1:24 PM Performed by: Gwyndolyn Saxon, CRNA Pre-anesthesia Checklist: Patient identified, Emergency Drugs available, Suction available and Patient being monitored Patient Re-evaluated:Patient Re-evaluated prior to induction Oxygen Delivery Method: Circle system utilized Preoxygenation: Pre-oxygenation with 100% oxygen Induction Type: IV induction Ventilation: Mask ventilation without difficulty Laryngoscope Size: Miller and 2 Grade View: Grade I Tube size: 8.5 mm Number of attempts: 1 Airway Equipment and Method: Patient positioned with wedge pillow and Stylet Placement Confirmation: ETT inserted through vocal cords under direct vision,  positive ETCO2 and breath sounds checked- equal and bilateral Secured at: 21 cm Tube secured with: Tape Dental Injury: Teeth and Oropharynx as per pre-operative assessment

## 2021-04-13 NOTE — Anesthesia Preprocedure Evaluation (Addendum)
Anesthesia Evaluation  Patient identified by MRN, date of birth, ID band Patient confused    Reviewed: Allergy & Precautions, NPO status , Patient's Chart, lab work & pertinent test results, Unable to perform ROS - Chart review only  Airway Mallampati: II  TM Distance: >3 FB     Dental  (+) Edentulous Upper, Edentulous Lower   Pulmonary Current Smoker,    + rhonchi        Cardiovascular hypertension,  Rhythm:Regular Rate:Normal     Neuro/Psych    GI/Hepatic   Endo/Other  diabetes  Renal/GU      Musculoskeletal   Abdominal   Peds  Hematology   Anesthesia Other Findings   Reproductive/Obstetrics                            Anesthesia Physical Anesthesia Plan  ASA: III  Anesthesia Plan: General   Post-op Pain Management:    Induction: Intravenous  PONV Risk Score and Plan: Ondansetron and Dexamethasone  Airway Management Planned: Oral ETT  Additional Equipment:   Intra-op Plan:   Post-operative Plan: Extubation in OR  Informed Consent: I have reviewed the patients History and Physical, chart, labs and discussed the procedure including the risks, benefits and alternatives for the proposed anesthesia with the patient or authorized representative who has indicated his/her understanding and acceptance.       Plan Discussed with: CRNA and Anesthesiologist  Anesthesia Plan Comments:         Anesthesia Quick Evaluation

## 2021-04-13 NOTE — Interval H&P Note (Signed)
History and Physical Interval Note:  04/13/2021 1:17 PM  Tammy Blair  has presented today for surgery, with the diagnosis of lung mass, adenopathy.  The various methods of treatment have been discussed with the patient and family. After consideration of risks, benefits and other options for treatment, the patient has consented to  Procedure(s): [name] (Bilateral) as a surgical intervention.  The patient's history has been reviewed, patient examined, no change in status, stable for surgery.  I have reviewed the patient's chart and labs.  Questions were answered to the patient's satisfaction.     [place]

## 2021-04-13 NOTE — Progress Notes (Signed)
Patient uncooperative and confused. Keeps taking oxygen off

## 2021-04-13 NOTE — Consult Note (Signed)
Synopsis: Referred in May 2022 for lung mass adenopathy by No ref. provider found  Subjective:   PATIENT ID: Tammy Blair: female DOB: [birth year], MRN: [id number]  Chief Complaint  Patient presents with  . Weakness    This is a 70 year old female, history of COPD, type 2 diabetes, hypertension, current every day smoker.  Patient initially presented to [facility] with upper extremity weakness.  Patient had CT imaging and MRI brain imaging consistent with brain metastasis.  Patient was found to have 16 enhancing lesions.  Patient was transferred to the cancer center at [facility] [place] to start radiation therapy.  CT scan of the chest that was completed on 04/08/2021 revealed bulky mediastinal adenopathy with associated 3.7 cm right upper lobe lung mass concerning for an advanced stage bronchogenic carcinoma.  Pulmonary was consulted for tissue sampling of the mediastinum.  Soft tissue needed for molecular diagnostics from the tissue for treatment regimen planning.  Patient has also completed radiation oncology consultation and started her brain radiation treatments.  She was also started on dexamethasone   Past Medical History:  Diagnosis Date  . Anxiety   . Asthma   . Chronic back pain   . COPD (chronic obstructive pulmonary disease) ([place])    on home O2  . Diabetes mellitus without complication ([place])   . HOH (hard of hearing)   . Hypertension   . Lumbar radiculopathy      Family History  Problem Relation Age of Onset  . COPD Mother   . Cancer Mother        kidney  . Stroke Brother   . Asthma Sister      Past Surgical History:  Procedure Laterality Date  . CESAREAN SECTION    . TONSILLECTOMY      Social History   Socioeconomic History  . Marital status: Divorced    Spouse name: Not on file  . Number of children: Not on file  . Years of education: Not on file  . Highest education level: Not on file  Occupational History  . Not on file  Tobacco Use  .  Smoking status: Current Every Day Smoker    Packs/day: 0.50    Years: 40.00    Pack years: 20.00    Types: Cigarettes  . Smokeless tobacco: Never Used  Vaping Use  . Vaping Use: Never used  Substance and Sexual Activity  . Alcohol use: No  . Drug use: Not Currently  . Sexual activity: Not Currently  Other Topics Concern  . Not on file  Social History Narrative  . Not on file   Social Determinants of Health   Financial Resource Strain: Not on file  Food Insecurity: Not on file  Transportation Needs: Not on file  Physical Activity: Not on file  Stress: Not on file  Social Connections: Not on file  Intimate Partner Violence: Not on file     No Known Allergies   @ENCMEDSTART @  Review of Systems  Constitutional: Positive for malaise/fatigue. Negative for chills, fever and weight loss.  HENT: Negative for hearing loss, sore throat and tinnitus.   Eyes: Negative for blurred vision and double vision.  Respiratory: Positive for cough and shortness of breath. Negative for hemoptysis, sputum production, wheezing and stridor.   Cardiovascular: Negative for chest pain, palpitations, orthopnea, leg swelling and PND.  Gastrointestinal: Negative for abdominal pain, constipation, diarrhea, heartburn, nausea and vomiting.  Genitourinary: Negative for dysuria, hematuria and urgency.  Musculoskeletal: Negative for joint pain and  myalgias.  Skin: Negative for itching and rash.  Neurological: Positive for focal weakness and weakness. Negative for dizziness, tingling and headaches.  Endo/Heme/Allergies: Negative for environmental allergies. Does not bruise/bleed easily.  Psychiatric/Behavioral: Negative for depression. The patient is not nervous/anxious and does not have insomnia.   All other systems reviewed and are negative.    Objective:  Physical Exam Vitals reviewed.  Constitutional:      General: She is not in acute distress.    Appearance: She is well-developed.  HENT:      Head: Normocephalic and atraumatic.  Eyes:     General: No scleral icterus.    Conjunctiva/sclera: Conjunctivae normal.     Pupils: Pupils are equal, round, and reactive to light.  Neck:     Vascular: No JVD.     Trachea: No tracheal deviation.  Cardiovascular:     Rate and Rhythm: Normal rate and regular rhythm.     Heart sounds: Normal heart sounds. No murmur heard.   Pulmonary:     Effort: Pulmonary effort is normal. No tachypnea, accessory muscle usage or respiratory distress.     Breath sounds: No stridor. No wheezing, rhonchi or rales.  Abdominal:     General: Bowel sounds are normal. There is no distension.     Palpations: Abdomen is soft.     Tenderness: There is no abdominal tenderness.  Musculoskeletal:        General: No tenderness.     Cervical back: Neck supple.  Lymphadenopathy:     Cervical: No cervical adenopathy.  Skin:    General: Skin is warm and dry.     Capillary Refill: Capillary refill takes less than 2 seconds.     Findings: No rash.  Neurological:     Mental Status: She is alert.     Motor: Weakness present.     Coordination: Coordination abnormal.  Psychiatric:        Behavior: Behavior normal.      Vitals:   04/12/21 1259 04/12/21 2157 04/13/21 0453 04/13/21 0741  BP: 129/61 (!) 158/68 (!) 159/84   Pulse: (!) 49 69 (!) 59   Resp: 19 18 14    Temp: 98 F (36.7 C) (!) 97.4 F (36.3 C) 98.1 F (36.7 C)   TempSrc: Oral Oral Oral   SpO2: 97% 91% 93% 96%  Weight:      Height:       96% on 2L BMI Readings from Last 3 Encounters:  04/07/21 29.82 kg/m  09/18/18 29.82 kg/m  06/22/18 30.93 kg/m   Wt Readings from Last 3 Encounters:  04/07/21 91.6 kg  09/18/18 91.6 kg  06/22/18 84.3 kg     CBC    Component Value Date/Time   WBC 18.3 (H) 04/13/2021 0522   RBC 4.69 04/13/2021 0522   HGB 14.8 04/13/2021 0522   HCT 44.4 04/13/2021 0522   PLT 244 04/13/2021 0522   MCV 94.7 04/13/2021 0522   MCH 31.6 04/13/2021 0522   MCHC 33.3  04/13/2021 0522   RDW 12.5 04/13/2021 0522   LYMPHSABS 1.0 04/12/2021 0553   MONOABS 0.9 04/12/2021 0553   EOSABS 0.0 04/12/2021 0553   BASOSABS 0.0 04/12/2021 0553    Chest Imaging: 04/08/2021 CT chest: 3 cm lower lobe lung mass with associated bulky mediastinal adenopathy concerning for advanced age bronchogenic carcinoma. The patient's images have been independently reviewed by me.    Pulmonary Functions Testing Results: No flowsheet data found.  FeNO:   Pathology:   Echocardiogram:  Heart Catheterization:     Assessment & Plan:   This is a 70 year old female with a 3.7 cm right upper lobe lung mass, bulky adenopathy with 16 enhancing lesions of the brain concerning for an advanced stage bronchogenic carcinoma, likely stage IV lung cancer.  Discussion: Today we discussed the risk benefits and alternatives of proceeding with video bronchoscopy endobronchial ultrasound and transbronchial needle aspirations of the bulky mediastinal adenopathy to obtain tissue diagnosis. We need soft tissue diagnosis for the completion of molecular diagnostics to be used for a treatment plan determination. We discussed the risk of bleeding and pneumothorax. No barriers at this time. Patient is agreeable to proceed.   [facility] 04/13/2021 11:52 AM

## 2021-04-13 NOTE — Progress Notes (Signed)
PROGRESS NOTE  Tammy Blair UTM:[id number] DOB: 03-19-1951   PCP: [name], MD  Patient is from: Home.  Lives with husband.  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    Initially, concern about CVA.  CT head showed moderate to large area of low density in the left frontal lobe concerning for CVA.  However, MRI brain negative for CVA but  multiple intracranial lesions with surrounding vasogenic edema compatible with metastatic disease.  CT chest/abdomen/pelvis showed 3.7 cm RUL lung mass with extensive thoracic and some lower neck adenopathy concerning for bronchogenic carcinoma with mets to brain.  CT also showed moderate pericardial effusion.  Oncology consulted and started patient on Decadron for vasogenic edema.  Palliative medicine and pulmonology consulted as well.  Patient transferred to [facility] for radiation therapy that she started on 5/2.  Underwent bronchoscopy with endobronchial [place] and biopsies by Dr. Valeta Harms on 5/3.  Therapy recommended CIR but patient would like to go home with home health and DME.  Subjective: Seen and examined earlier this morning before she went to [facility] for bronchoscopy and biopsy.  No major events overnight of this morning.  She is anxious about the procedure.  No other complaints other than anxiety and chronic back pain.  Objective: Vitals:   04/13/21 0741 04/13/21 1430 04/13/21 1440 04/13/21 1450  BP:  (!) 177/61 (!) 162/71 (!) 157/68  Pulse:  65 66 75  Resp:  16 17 20   Temp:  97.8 F (36.6 C)    TempSrc:      SpO2: 96% 96% (!) 88% 94%  Weight:      Height:        Intake/Output Summary (Last 24 hours) at 04/13/2021  1659 Last data filed at 04/13/2021 1433 Gross per 24 hour  Intake 700 ml  Output 5 ml  Net 695 ml   Filed Weights   04/07/21 1923  Weight: 91.6 kg    Examination  GENERAL: No apparent distress.  Nontoxic. HEENT: MMM.  Vision grossly intact.  Diminished hearing. NECK: Supple.  No apparent JVD.  RESP: 93% on 2 L.  No IWOB.  Fair aeration bilaterally. CVS:  RRR. Heart sounds normal.  ABD/GI/GU: BS+. Abd soft, NTND.  MSK/EXT:  Moves extremities. No apparent deformity. No edema.  SKIN: no apparent skin lesion or wound NEURO: Awake, alert and oriented appropriately.  No apparent focal neuro deficit other than diminished hearing and RUE weakness (3/5). PSYCH: Somewhat anxious.  Procedures:  5/2-started radiation therapy 5/3-bronchoscopy with biopsies  Microbiology summarized: [id number] and influenza PCR nonreactive.  Assessment & Plan: Lung cancer with brain metastasis and intrathoracic lymphadenopathy-history of heavy tobacco use -Started radiation therapy on 5/2. -S/p bronchoscopy and biopsy on 5/3. -Continue Decadron 4 mg every 6 hours for vasogenic edema.  Defer tapering to radiation oncology. -Outpatient follow-up with pulmonology and oncology -Appreciate help by palliative medicine  Right arm weakness: Likely due to the above.  MRI negative for CVA -Therapy recommended CIR but she prefers to go home with home health.  Fall at home: Likely due to the above.  Did not hit her head or lose consciousness.  No traumatic injury. -Continue therapy as above -Continue fall  precaution  Mild to moderate pericardial effusion: Likely due to malignancy.  No clinical signs of tamponade.  Chronic COPD/RF-on 3 L at baseline.  Now saturating well on room air. -On Decadron for vasogenic edema -Continue breathing treatments  Chronic low back pain/lumbar radiculopathy: No acute neuro findings in the lower extremities.  Asking for oxycodone to be increased although she is not on oxycodone  at home.  She refuses to take Tylenol saying  her PCP advised her not to take.  Her LFTs normal. -Scheduled Tylenol and gabapentin. -Continue as needed Flexeril and oxycodone at current dose.  No indication for increment  Controlled NIDDM-2 with hyperglycemia and hyperlipidemia: A1c 6.3%.  Hyperglycemia likely due to steroid.  Only on metformin at home. Recent Labs  Lab 04/12/21 1134 04/12/21 1650 04/12/21 2159 04/13/21 0724 04/13/21 1141  GLUCAP 191* 219* 296* 220* 173*  -Continue SSI-resistant -Continue Lantus 15 units twice daily -Continue mealtime coverage at 8 units 3 times daily -Further adjustment as appropriate. -Continue statin. -Her hyperglycemia would be a challenge on discharge if we continue steroid as she only takes metformin at home.   Hyponatremia: Na 134. -Recheck in the morning  Hypokalemia: Resolved.  Tobacco use disorder- -continue nicotine patch  Essential hypertension/history of tachycardia: BP slightly elevated.  HR within normal. -Discontinued metoprolol given COPD and chronic RF -Changed Cardizem to Cardizem CD 240 mg daily -Cardizem 30 mg every 6 hours as needed tachycardia.  Sinusitis? -Continue p.o. Augmentin for total of 10 days  Euthyroid syndrome-TSH mildly elevated.  Free T4 within normal.  Goal of care: Patient with new metastatic lung cancer, chronic COPD with respiratory failure.  Poor long-term prognosis.  Still full code which I believe would pose more harm than benefit -Palliative medicine following  Anxiety: Seems worse partly from steroid. -Continue Xanax 0.5 mg 3 times daily as needed.  Leukocytosis: Likely demargination from steroid.  Improved -Continue monitoring  Overweight Body mass index is 29.82 kg/m.         DVT prophylaxis:  heparin injection 5,000 Units Start: 04/08/21 0600 SCD's Start: 04/08/21 0058  Code Status: Full code Family Communication: Updated patient's husband at bedside Level of care:  Med-Surg Status is: Inpatient  Remains inpatient appropriate because:Unsafe d/c plan, IV treatments appropriate due to intensity of illness or inability to take PO and Inpatient level of care appropriate due to severity of illness   Dispo: The patient is from: Home              Anticipated d/c is to: Home              Patient currently is not medically stable to d/c.   Difficult to place patient No       Consultants:  Oncology Radiation oncology Pulmonology Palliative medicine   Sch Meds:  Scheduled Meds: . acetaminophen  1,000 mg Oral Q8H  . amoxicillin-clavulanate  1 tablet Oral Q12H  . aspirin EC  81 mg Oral Daily  . atorvastatin  40 mg Oral Daily  . chlorthalidone  25 mg Oral Daily  . dexamethasone  4 mg Oral Q6H  . diltiazem  240 mg Oral Daily  . famotidine  20 mg Oral Daily  . gabapentin  400 mg Oral TID  . heparin  5,000 Units Subcutaneous Q8H  . insulin aspart  0-20 Units Subcutaneous TID WC  . insulin aspart  0-5 Units Subcutaneous QHS  . insulin aspart  8 Units Subcutaneous TID WC  . insulin glargine  15 Units Subcutaneous  BID  . metoprolol tartrate      . mometasone-formoterol  2 puff Inhalation BID  . nicotine  21 mg Transdermal Daily  . umeclidinium bromide  1 puff Inhalation Daily   Continuous Infusions: PRN Meds:.albuterol, ALPRAZolam, cyclobenzaprine, diltiazem, ondansetron (ZOFRAN) IV, oxyCODONE  Antimicrobials: Anti-infectives (From admission, onward)   Start     Dose/Rate Route Frequency Ordered Stop   04/08/21 0145  amoxicillin-clavulanate (AUGMENTIN) 875-125 MG per tablet 1 tablet        1 tablet Oral Every 12 hours 04/08/21 0045 04/14/21 2159       I have personally reviewed the following labs and images: CBC: Recent Labs  Lab 04/07/21 2107 04/11/21 0554 04/12/21 0553 04/13/21 0522  WBC 12.5* 22.6* 18.5* 18.3*  NEUTROABS 8.4*  --  16.3*  --   HGB 15.0 14.1 14.7 14.8  HCT 46.0 42.1 44.1 44.4  MCV 96.8 93.6 94.8 94.7  PLT 301  277 292 244   BMP &GFR Recent Labs  Lab 04/07/21 2107 04/11/21 0554 04/12/21 0553 04/13/21 0522  NA 134* 135 139 134*  K 3.3* 3.4* 3.5 4.1  CL 90* 94* 94* 93*  CO2 35* 31 33* 31  GLUCOSE 111* 188* 223* 169*  BUN 18 22 32* 29*  CREATININE 0.63 0.60 0.65 0.66  CALCIUM 9.3 9.3 9.3 9.1  MG  --  2.0 2.2 2.2  PHOS  --  3.4 3.3 3.3   Estimated Creatinine Clearance: 81.2 mL/min (by C-G formula based on SCr of 0.66 mg/dL). Liver & Pancreas: Recent Labs  Lab 04/07/21 2107 04/11/21 0554 04/12/21 0553 04/13/21 0522  AST 27  --   --   --   ALT 28  --   --   --   ALKPHOS 58  --   --   --   BILITOT 0.5  --   --   --   PROT 8.2*  --   --   --   ALBUMIN 3.9 3.6 3.7 3.4*   No results for input(s): LIPASE, AMYLASE in the last 168 hours. No results for input(s): AMMONIA in the last 168 hours. Diabetic: No results for input(s): HGBA1C in the last 72 hours. Recent Labs  Lab 04/12/21 1134 04/12/21 1650 04/12/21 2159 04/13/21 0724 04/13/21 1141  GLUCAP 191* 219* 296* 220* 173*   Cardiac Enzymes: No results for input(s): CKTOTAL, CKMB, CKMBINDEX, TROPONINI in the last 168 hours. No results for input(s): PROBNP in the last 8760 hours. Coagulation Profile: No results for input(s): INR, PROTIME in the last 168 hours. Thyroid Function Tests: No results for input(s): TSH, T4TOTAL, FREET4, T3FREE, THYROIDAB in the last 72 hours. Lipid Profile: No results for input(s): CHOL, HDL, LDLCALC, TRIG, CHOLHDL, LDLDIRECT in the last 72 hours. Anemia Panel: No results for input(s): VITAMINB12, FOLATE, FERRITIN, TIBC, IRON, RETICCTPCT in the last 72 hours. Urine analysis:    Component Value Date/Time   COLORURINE YELLOW 04/08/2021 1600   APPEARANCEUR CLEAR 04/08/2021 1600   LABSPEC 1.012 04/08/2021 1600   PHURINE 6.0 04/08/2021 1600   GLUCOSEU NEGATIVE 04/08/2021 1600   HGBUR NEGATIVE 04/08/2021 1600   BILIRUBINUR NEGATIVE 04/08/2021 1600   KETONESUR NEGATIVE 04/08/2021 1600   PROTEINUR  NEGATIVE 04/08/2021 1600   UROBILINOGEN 0.2 07/17/2014 1613   NITRITE NEGATIVE 04/08/2021 1600   LEUKOCYTESUR SMALL (A) 04/08/2021 1600   Sepsis Labs: Invalid input(s): PROCALCITONIN, [place]  Microbiology: Recent Results (from the past 240 hour(s))  Resp Panel by RT-PCR (Flu A&B, Covid) Nasopharyngeal Swab  Status: None   Collection Time: 04/07/21 11:02 PM   Specimen: Nasopharyngeal Swab; Nasopharyngeal(NP) swabs in vial transport medium  Result Value Ref Range Status   SARS Coronavirus 2 by RT PCR NEGATIVE NEGATIVE Final    Comment: (NOTE) SARS-CoV-2 target nucleic acids are NOT DETECTED.  The SARS-CoV-2 RNA is generally detectable in upper respiratory specimens during the acute phase of infection. The lowest concentration of SARS-CoV-2 viral copies this assay can detect is 138 copies/mL. A negative result does not preclude SARS-Cov-2 infection and should not be used as the sole basis for treatment or other patient management decisions. A negative result may occur with  improper specimen collection/handling, submission of specimen other than nasopharyngeal swab, presence of viral mutation(s) within the areas targeted by this assay, and inadequate number of viral copies(<138 copies/mL). A negative result must be combined with clinical observations, patient history, and epidemiological information. The expected result is Negative.  Fact Sheet for Patients:  EntrepreneurPulse.com.au  Fact Sheet for Healthcare Providers:  IncredibleEmployment.be  This test is no t yet approved or cleared by the [place] FDA and  has been authorized for detection and/or diagnosis of SARS-CoV-2 by FDA under an Emergency Use Authorization (EUA). This EUA will remain  in effect (meaning this test can be used) for the duration of the COVID-19 declaration under Section 564(b)(1) of the Act, 21 U.S.C.section 360bbb-3(b)(1), unless the authorization is  terminated  or revoked sooner.       Influenza A by PCR NEGATIVE NEGATIVE Final   Influenza B by PCR NEGATIVE NEGATIVE Final    Comment: (NOTE) The Xpert Xpress SARS-CoV-2/FLU/RSV plus assay is intended as an aid in the diagnosis of influenza from Nasopharyngeal swab specimens and should not be used as a sole basis for treatment. Nasal washings and aspirates are unacceptable for Xpert Xpress SARS-CoV-2/FLU/RSV testing.  Fact Sheet for Patients: EntrepreneurPulse.com.au  Fact Sheet for Healthcare Providers: IncredibleEmployment.be  This test is not yet approved or cleared by the [place] FDA and has been authorized for detection and/or diagnosis of SARS-CoV-2 by FDA under an Emergency Use Authorization (EUA). This EUA will remain in effect (meaning this test can be used) for the duration of the COVID-19 declaration under Section 564(b)(1) of the Act, 21 U.S.C. section 360bbb-3(b)(1), unless the authorization is terminated or revoked.  Performed at [facility], [address], [place], [place] [address]     Radiology Studies: No results found.    Bexton Haak T. Cecil-Bishop  If 7PM-7AM, please contact night-coverage [email or web address] 04/13/2021, 4:59 PM

## 2021-04-13 NOTE — H&P (View-Only) (Signed)
Synopsis: Referred in May 2022 for lung mass adenopathy by No ref. provider found  Subjective:   PATIENT ID: Tammy Blair: female DOB: 04/18/1951, MRN: [id number]  Chief Complaint  Patient presents with  . Weakness    This is a 70 year old female, history of COPD, type 2 diabetes, hypertension, current every day smoker.  Patient initially presented to [facility] with upper extremity weakness.  Patient had CT imaging and MRI brain imaging consistent with brain metastasis.  Patient was found to have 16 enhancing lesions.  Patient was transferred to the cancer center at [facility] to start radiation therapy.  CT scan of the chest that was completed on 04/08/2021 revealed bulky mediastinal adenopathy with associated 3.7 cm right upper lobe lung mass concerning for an advanced stage bronchogenic carcinoma.  Pulmonary was consulted for tissue sampling of the mediastinum.  Soft tissue needed for molecular diagnostics from the tissue for treatment regimen planning.  Patient has also completed radiation oncology consultation and started her brain radiation treatments.  She was also started on dexamethasone   Past Medical History:  Diagnosis Date  . Anxiety   . Asthma   . Chronic back pain   . COPD (chronic obstructive pulmonary disease) ([place])    on home O2  . Diabetes mellitus without complication ([place])   . HOH (hard of hearing)   . Hypertension   . Lumbar radiculopathy      Family History  Problem Relation Age of Onset  . COPD Mother   . Cancer Mother        kidney  . Stroke Brother   . Asthma Sister      Past Surgical History:  Procedure Laterality Date  . CESAREAN SECTION    . TONSILLECTOMY      Social History   Socioeconomic History  . Marital status: Divorced    Spouse name: Not on file  . Number of children: Not on file  . Years of education: Not on file  . Highest education level: Not on file  Occupational History  . Not on file  Tobacco Use  .  Smoking status: Current Every Day Smoker    Packs/day: 0.50    Years: 40.00    Pack years: 20.00    Types: Cigarettes  . Smokeless tobacco: Never Used  Vaping Use  . Vaping Use: Never used  Substance and Sexual Activity  . Alcohol use: No  . Drug use: Not Currently  . Sexual activity: Not Currently  Other Topics Concern  . Not on file  Social History Narrative  . Not on file   Social Determinants of Health   Financial Resource Strain: Not on file  Food Insecurity: Not on file  Transportation Needs: Not on file  Physical Activity: Not on file  Stress: Not on file  Social Connections: Not on file  Intimate Partner Violence: Not on file     No Known Allergies   @ENCMEDSTART @  Review of Systems  Constitutional: Positive for malaise/fatigue. Negative for chills, fever and weight loss.  HENT: Negative for hearing loss, sore throat and tinnitus.   Eyes: Negative for blurred vision and double vision.  Respiratory: Positive for cough and shortness of breath. Negative for hemoptysis, sputum production, wheezing and stridor.   Cardiovascular: Negative for chest pain, palpitations, orthopnea, leg swelling and PND.  Gastrointestinal: Negative for abdominal pain, constipation, diarrhea, heartburn, nausea and vomiting.  Genitourinary: Negative for dysuria, hematuria and urgency.  Musculoskeletal: Negative for joint pain and  myalgias.  Skin: Negative for itching and rash.  Neurological: Positive for focal weakness and weakness. Negative for dizziness, tingling and headaches.  Endo/Heme/Allergies: Negative for environmental allergies. Does not bruise/bleed easily.  Psychiatric/Behavioral: Negative for depression. The patient is not nervous/anxious and does not have insomnia.   All other systems reviewed and are negative.    Objective:  Physical Exam Vitals reviewed.  Constitutional:      General: She is not in acute distress.    Appearance: She is well-developed.  HENT:      Head: Normocephalic and atraumatic.  Eyes:     General: No scleral icterus.    Conjunctiva/sclera: Conjunctivae normal.     Pupils: Pupils are equal, round, and reactive to light.  Neck:     Vascular: No JVD.     Trachea: No tracheal deviation.  Cardiovascular:     Rate and Rhythm: Normal rate and regular rhythm.     Heart sounds: Normal heart sounds. No murmur heard.   Pulmonary:     Effort: Pulmonary effort is normal. No tachypnea, accessory muscle usage or respiratory distress.     Breath sounds: No stridor. No wheezing, rhonchi or rales.  Abdominal:     General: Bowel sounds are normal. There is no distension.     Palpations: Abdomen is soft.     Tenderness: There is no abdominal tenderness.  Musculoskeletal:        General: No tenderness.     Cervical back: Neck supple.  Lymphadenopathy:     Cervical: No cervical adenopathy.  Skin:    General: Skin is warm and dry.     Capillary Refill: Capillary refill takes less than 2 seconds.     Findings: No rash.  Neurological:     Mental Status: She is alert.     Motor: Weakness present.     Coordination: Coordination abnormal.  Psychiatric:        Behavior: Behavior normal.      Vitals:   04/12/21 1259 04/12/21 2157 04/13/21 0453 04/13/21 0741  BP: 129/61 (!) 158/68 (!) 159/84   Pulse: (!) 49 69 (!) 59   Resp: 19 18 14    Temp: 98 F (36.7 C) (!) 97.4 F (36.3 C) 98.1 F (36.7 C)   TempSrc: Oral Oral Oral   SpO2: 97% 91% 93% 96%  Weight:      Height:       96% on 2L BMI Readings from Last 3 Encounters:  04/07/21 29.82 kg/m  09/18/18 29.82 kg/m  06/22/18 30.93 kg/m   Wt Readings from Last 3 Encounters:  04/07/21 91.6 kg  09/18/18 91.6 kg  06/22/18 84.3 kg     CBC    Component Value Date/Time   WBC 18.3 (H) 04/13/2021 0522   RBC 4.69 04/13/2021 0522   HGB 14.8 04/13/2021 0522   HCT 44.4 04/13/2021 0522   PLT 244 04/13/2021 0522   MCV 94.7 04/13/2021 0522   MCH 31.6 04/13/2021 0522   MCHC 33.3  04/13/2021 0522   RDW 12.5 04/13/2021 0522   LYMPHSABS 1.0 04/12/2021 0553   MONOABS 0.9 04/12/2021 0553   EOSABS 0.0 04/12/2021 0553   BASOSABS 0.0 04/12/2021 0553    Chest Imaging: 04/08/2021 CT chest: 3 cm lower lobe lung mass with associated bulky mediastinal adenopathy concerning for advanced age bronchogenic carcinoma. The patient's images have been independently reviewed by me.    Pulmonary Functions Testing Results: No flowsheet data found.  FeNO:   Pathology:   Echocardiogram:  Heart Catheterization:     Assessment & Plan:   This is a 70 year old female with a 3.7 cm right upper lobe lung mass, bulky adenopathy with 16 enhancing lesions of the brain concerning for an advanced stage bronchogenic carcinoma, likely stage IV lung cancer.  Discussion: Today we discussed the risk benefits and alternatives of proceeding with video bronchoscopy endobronchial ultrasound and transbronchial needle aspirations of the bulky mediastinal adenopathy to obtain tissue diagnosis. We need soft tissue diagnosis for the completion of molecular diagnostics to be used for a treatment plan determination. We discussed the risk of bleeding and pneumothorax. No barriers at this time. Patient is agreeable to proceed.   [facility] 04/13/2021 11:52 AM

## 2021-04-13 NOTE — Progress Notes (Signed)
Palliative:  Call to daughter, Ronald Lobo.  [name] states that she spoke with Hilda Blades yesterday, but she was having a very hard day after receiving radiation therapy.  She said that Hisako was very tearful, and could not talk about future choices.  I share that [place] is having bronchoscopy today.  [name] states that she had spoken with the doctor earlier.    I share that at this point Neoma Laming is having a difficult time making choices, and we are going to lean on [name] and her brother to help guide care.  [name] states that she and her brother are willing to help.  We talked about disposition.  [name] states that Mrs. Betke will likely prefer to return home with home health.  She tells me that she and her brother can make sure that her needs are met.  Conference with attending, bedside nursing staff, transition of care team related to patient condition, needs, goals of care.  Plan: Continue full scope/full code.  Anticipate home with home health.  Requesting oncology at [facility] with Dr. Raliegh Ip.  Outpatient palliative services to follow.  15 minutes Quinn Axe, NP Palliative medicine team Team phone [phone] Greater than 50% of this time was spent counseling and coordinating care related to the above assessment and plan.

## 2021-04-13 NOTE — Op Note (Signed)
Video Bronchoscopy with Endobronchial Ultrasound Procedure Note  Date of Operation: 04/13/2021  Pre-op Diagnosis: Lung mass, mediastinal adenopathy, brain metastasis  Post-op Diagnosis: Lung mass, mediastinal adenopathy, brain metastasis  Surgeon: Garner Nash, DO   Assistants: None   Anesthesia: General endotracheal anesthesia  Operation: Flexible video fiberoptic bronchoscopy with endobronchial ultrasound and biopsies.  Estimated Blood Loss: Minimal  Complications: None  Indications and History: Tammy Blair is a 70 y.o. female with mediastinal adenopathy, lung mass, metastatic lesions to the brain concerning for advanced age bronchogenic carcinoma.  The risks, benefits, complications, treatment options and expected outcomes were discussed with the patient.  The possibilities of pneumothorax, pneumonia, reaction to medication, pulmonary aspiration, perforation of a viscus, bleeding, failure to diagnose a condition and creating a complication requiring transfusion or operation were discussed with the patient who freely signed the consent.    Description of Procedure: The patient was examined in the preoperative area and history and data from the preprocedure consultation were reviewed. It was deemed appropriate to proceed.  The patient was taken to [facility] endoscopy room 2, identified as Tammy Blair and the procedure verified as Flexible Video Fiberoptic Bronchoscopy.  A Time Out was held and the above information confirmed. After being taken to the operating room general anesthesia was initiated and the patient  was orally intubated. The video fiberoptic bronchoscope was introduced via the endotracheal tube and a general inspection was performed which showed normal right and left lung anatomy.  Prior to visualization of the distal subsegments the therapeutic bronchoscope was used for aspiration of thick mucous plugging and removal of tenacious secretions.  Saline was used for irrigation. The  standard scope was then withdrawn and the endobronchial ultrasound was used to identify and characterize the peritracheal, hilar and bronchial lymph nodes. Inspection showed enlarged right paratracheal adenopathy, hilar adenopathy. Using real-time ultrasound guidance Wang needle biopsies were take from Station 4R nodes and were sent for cytology. The patient tolerated the procedure well without apparent complications. There was no significant blood loss. The bronchoscope was withdrawn. Anesthesia was reversed and the patient was taken to the PACU for recovery.   Samples: 1. Wang needle biopsies from station 4R node  Plans:  The patient will be discharged from the PACU to home when recovered from anesthesia. We will review the cytology, pathology and microbiology results with the patient when they become available. Outpatient followup will be with Garner Nash, DO.   Garner Nash, DO [facility] 04/13/2021 2:27 PM

## 2021-04-13 NOTE — Discharge Instructions (Signed)
Flexible Bronchoscopy, Care After This sheet gives you information about how to care for yourself after your test. Your doctor may also give you more specific instructions. If you have problems or questions, contact your doctor. Follow these instructions at home: Eating and drinking  Do not eat or drink anything (not even water) for 2 hours after your test, or until your numbing medicine (local anesthetic) wears off.  When your numbness is gone and your cough and gag reflexes have come back, you may: ? Eat only soft foods. ? Slowly drink liquids.  The day after the test, go back to your normal diet. Driving  Do not drive for 24 hours if you were given a medicine to help you relax (sedative).  Do not drive or use heavy machinery while taking prescription pain medicine. General instructions   Take over-the-counter and prescription medicines only as told by your doctor.  Return to your normal activities as told. Ask what activities are safe for you.  Do not use any products that have nicotine or tobacco in them. This includes cigarettes and e-cigarettes. If you need help quitting, ask your doctor.  Keep all follow-up visits as told by your doctor. This is important. It is very important if you had a tissue sample (biopsy) taken. Get help right away if:  You have shortness of breath that gets worse.  You get light-headed.  You feel like you are going to pass out (faint).  You have chest pain.  You cough up: ? More than a little blood. ? More blood than before. Summary  Do not eat or drink anything (not even water) for 2 hours after your test, or until your numbing medicine wears off.  Do not use cigarettes. Do not use e-cigarettes.  Get help right away if you have chest pain.   This information is not intended to replace advice given to you by your health care provider. Make sure you discuss any questions you have with your health care provider. Document Released:  09/25/2009 Document Revised: 11/10/2017 Document Reviewed: 12/16/2016 Elsevier Patient Education  2020 [employer].

## 2021-04-13 NOTE — Transfer of Care (Signed)
Immediate Anesthesia Transfer of Care Note  Patient: Tammy Blair  Procedure(s) Performed: VIDEO BRONCHOSCOPY WITH ENDOBRONCHIAL ULTRASOUND (Bilateral ) FINE NEEDLE ASPIRATION (FNA) LINEAR  Patient Location: PACU  Anesthesia Type:General  Level of Consciousness: confused  Airway & Oxygen Therapy: Patient Spontanous Breathing and Patient connected to nasal cannula oxygen  Post-op Assessment: Report given to RN and Post -op Vital signs reviewed and stable  Post vital signs: Reviewed and stable  Last Vitals:  Vitals Value Taken Time  BP 177/61 04/13/21 1430  Temp    Pulse 65 04/13/21 1433  Resp 12 04/13/21 1433  SpO2 91 % 04/13/21 1433  Vitals shown include unvalidated device data.  Last Pain:  Vitals:   04/13/21 1000  TempSrc:   PainSc: 4       Patients Stated Pain Goal: 3 (71/21/97 5883)  Complications: No complications documented.

## 2021-04-14 ENCOUNTER — Other Ambulatory Visit: Payer: Self-pay | Admitting: Student

## 2021-04-14 ENCOUNTER — Ambulatory Visit
Admit: 2021-04-14 | Discharge: 2021-04-14 | Disposition: A | Discharge status: Home / Self Care | Payer: Medicare Other | Attending: Radiation Oncology | Admitting: Radiation Oncology

## 2021-04-14 DIAGNOSIS — C3491 Malignant neoplasm of unspecified part of right bronchus or lung: Secondary | ICD-10-CM | POA: Diagnosis not present

## 2021-04-14 DIAGNOSIS — J9611 Chronic respiratory failure with hypoxia: Secondary | ICD-10-CM

## 2021-04-14 DIAGNOSIS — J449 Chronic obstructive pulmonary disease, unspecified: Secondary | ICD-10-CM

## 2021-04-14 DIAGNOSIS — C3411 Malignant neoplasm of upper lobe, right bronchus or lung: Secondary | ICD-10-CM | POA: Diagnosis not present

## 2021-04-14 DIAGNOSIS — G936 Cerebral edema: Secondary | ICD-10-CM

## 2021-04-14 DIAGNOSIS — E1165 Type 2 diabetes mellitus with hyperglycemia: Secondary | ICD-10-CM

## 2021-04-14 DIAGNOSIS — R599 Enlarged lymph nodes, unspecified: Secondary | ICD-10-CM

## 2021-04-14 DIAGNOSIS — M545 Low back pain, unspecified: Secondary | ICD-10-CM

## 2021-04-14 DIAGNOSIS — R531 Weakness: Secondary | ICD-10-CM | POA: Diagnosis not present

## 2021-04-14 DIAGNOSIS — C7931 Secondary malignant neoplasm of brain: Secondary | ICD-10-CM | POA: Diagnosis not present

## 2021-04-14 LAB — CBC
HCT: 40.3 % (ref 36.0–46.0)
Hemoglobin: 13.8 g/dL (ref 12.0–15.0)
MCH: 31.2 pg (ref 26.0–34.0)
MCHC: 34.2 g/dL (ref 30.0–36.0)
MCV: 91 fL (ref 80.0–100.0)
Platelets: 225 10*3/uL (ref 150–400)
RBC: 4.43 MIL/uL (ref 3.87–5.11)
RDW: 12.4 % (ref 11.5–15.5)
WBC: 22.3 10*3/uL — ABNORMAL HIGH (ref 4.0–10.5)
nRBC: 0 % (ref 0.0–0.2)

## 2021-04-14 LAB — RENAL FUNCTION PANEL
Albumin: 3.3 g/dL — ABNORMAL LOW (ref 3.5–5.0)
Anion gap: 10 (ref 5–15)
BUN: 24 mg/dL — ABNORMAL HIGH (ref 8–23)
CO2: 29 mmol/L (ref 22–32)
Calcium: 9.1 mg/dL (ref 8.9–10.3)
Chloride: 95 mmol/L — ABNORMAL LOW (ref 98–111)
Creatinine, Ser: 0.54 mg/dL (ref 0.44–1.00)
GFR, Estimated: >60 mL/min (ref >60)
Glucose, Bld: 189 mg/dL — ABNORMAL HIGH (ref 70–99)
Phosphorus: 3.8 mg/dL (ref 2.5–4.6)
Potassium: 4.2 mmol/L (ref 3.5–5.1)
Sodium: 134 mmol/L — ABNORMAL LOW (ref 135–145)

## 2021-04-14 LAB — MAGNESIUM: Magnesium: 2.2 mg/dL (ref 1.7–2.4)

## 2021-04-14 LAB — GLUCOSE, CAPILLARY
Glucose-Capillary: 269 mg/dL — ABNORMAL HIGH (ref 70–99)
Glucose-Capillary: 126 mg/dL — ABNORMAL HIGH (ref 70–99)

## 2021-04-14 MED ORDER — ACETAMINOPHEN 500 MG PO TABS: 500.0000 mg | ORAL_TABLET | Freq: Three times a day (TID) | ORAL | Status: AC

## 2021-04-14 MED ORDER — ATORVASTATIN CALCIUM 40 MG PO TABS: 40.0000 mg | ORAL_TABLET | Freq: Every day | ORAL | 1 refills | Status: AC

## 2021-04-14 MED ORDER — AMOXICILLIN-POT CLAVULANATE 875-125 MG PO TABS: 1.0000 | ORAL_TABLET | Freq: Two times a day (BID) | ORAL | 0 refills | Status: DC

## 2021-04-14 MED ORDER — OXYCODONE HCL 5 MG PO TABS: 5.0000 mg | ORAL_TABLET | Freq: Three times a day (TID) | ORAL | 0 refills | Status: DC | PRN

## 2021-04-14 MED ORDER — METFORMIN HCL ER 500 MG PO TB24: 500.0000 mg | ORAL_TABLET | Freq: Two times a day (BID) | ORAL | 1 refills | Status: AC

## 2021-04-14 MED ORDER — GABAPENTIN 300 MG PO CAPS
300.0000 mg | ORAL_CAPSULE | Freq: Three times a day (TID) | ORAL | 1 refills | Status: AC
Start: 2021-04-14 — End: ?

## 2021-04-14 MED ORDER — ALBUTEROL SULFATE HFA 108 (90 BASE) MCG/ACT IN AERS: 1.0000 | INHALATION_SPRAY | RESPIRATORY_TRACT | 0 refills | Status: DC | PRN

## 2021-04-14 MED ORDER — PEN NEEDLES 31G X 6 MM MISC: 1.0000 "pen " | Freq: Two times a day (BID) | 0 refills | Status: AC

## 2021-04-14 MED ORDER — ALBUTEROL SULFATE (2.5 MG/3ML) 0.083% IN NEBU: 2.5000 mg | INHALATION_SOLUTION | RESPIRATORY_TRACT | 1 refills | Status: DC | PRN

## 2021-04-14 MED ORDER — TIOTROPIUM BROMIDE MONOHYDRATE 18 MCG IN CAPS: 18.0000 ug | ORAL_CAPSULE | Freq: Every day | RESPIRATORY_TRACT | 1 refills | Status: DC

## 2021-04-14 MED ORDER — BUDESONIDE-FORMOTEROL FUMARATE 160-4.5 MCG/ACT IN AERO
1.0000 | INHALATION_SPRAY | Freq: Two times a day (BID) | RESPIRATORY_TRACT | 2 refills | Status: AC
Start: 2021-04-14 — End: ?

## 2021-04-14 MED ORDER — NOVOLIN 70/30 FLEXPEN RELION (70-30) 100 UNIT/ML ~~LOC~~ SUPN: PEN_INJECTOR | SUBCUTANEOUS | 1 refills | Status: AC

## 2021-04-14 MED ORDER — DILTIAZEM HCL ER COATED BEADS 240 MG PO CP24: 240.0000 mg | ORAL_CAPSULE | Freq: Every day | ORAL | 1 refills | Status: AC

## 2021-04-14 MED ORDER — ONDANSETRON HCL 4 MG PO TABS: 4.0000 mg | ORAL_TABLET | Freq: Every day | ORAL | 1 refills | Status: AC | PRN

## 2021-04-14 MED ORDER — ALPRAZOLAM 0.5 MG PO TABS: 0.5000 mg | ORAL_TABLET | Freq: Three times a day (TID) | ORAL | 0 refills | Status: DC | PRN

## 2021-04-14 MED ORDER — NICOTINE 21 MG/24HR TD PT24
21.0000 mg | MEDICATED_PATCH | Freq: Every day | TRANSDERMAL | 1 refills | Status: DC
Start: 2021-04-14 — End: 2021-06-01

## 2021-04-14 MED ORDER — ASPIRIN 81 MG PO TBEC: 81.0000 mg | DELAYED_RELEASE_TABLET | Freq: Every day | ORAL | 11 refills | Status: DC

## 2021-04-14 MED ORDER — NOVOLIN 70/30 FLEXPEN RELION (70-30) 100 UNIT/ML ~~LOC~~ SUPN: 30.0000 [IU] | PEN_INJECTOR | Freq: Two times a day (BID) | SUBCUTANEOUS | 1 refills | Status: DC

## 2021-04-14 MED ORDER — BLOOD GLUCOSE METER KIT: PACK | 0 refills | Status: AC

## 2021-04-14 MED ORDER — DEXAMETHASONE 4 MG PO TABS
4.0000 mg | ORAL_TABLET | Freq: Three times a day (TID) | ORAL | 0 refills | Status: DC
Start: 2021-04-14 — End: 2021-06-01

## 2021-04-14 MED ORDER — UMECLIDINIUM BROMIDE 62.5 MCG/INH IN AEPB
1.0000 | INHALATION_SPRAY | Freq: Every day | RESPIRATORY_TRACT | 1 refills | Status: DC
Start: 2021-04-15 — End: 2021-04-19

## 2021-04-14 NOTE — Progress Notes (Signed)
Sent Rx for oxycodone 5 mg every 8 hours as needed, #9.

## 2021-04-14 NOTE — Progress Notes (Signed)
Inpatient Diabetes Program Recommendations  AACE/ADA: New Consensus Statement on Inpatient Glycemic Control (2015)  Target Ranges:  Prepandial:   less than 140 mg/dL      Peak postprandial:   less than 180 mg/dL (1-2 hours)      Critically ill patients:  140 - 180 mg/dL   Lab Results  Component Value Date   GLUCAP 126 (H) 04/14/2021   HGBA1C 6.3 (H) 04/08/2021    Review of Glycemic Control  Discharge Diabetes Meds: Novolin ReliOn 70/30 Flexpen 30 units BID Metformin 500 mg BID  Decadron 4 mg TID  Inpatient Diabetes Program Recommendations:     Educated patient and friend on insulin pen use at home. Reviewed contents of insulin flexpen starter kit. Reviewed all steps if insulin pen including attachment of needle, 2-unit air shot, dialing up dose, giving injection, removing needle, disposal of sharps, storage of unused insulin, disposal of insulin etc. Patient's friend able to provide successful return demonstration. Also reviewed troubleshooting with insulin pen. MD to give patient Rxs for insulin pens and insulin pen needles.  Pt's friend states he will make sure her family understands insulin pen administration. Pt was getting mixed up with insulin and Decadron, saying "the Doctor told me to take it 3x/day, not 2x." Pt's friend states family will be giving insulin shots.   Insulin pen instructions attached to discharge summary.  Thank you. Lorenda Peck, RD, LDN, CDE Inpatient Diabetes Coordinator [phone]

## 2021-04-14 NOTE — TOC Initial Note (Signed)
Transition of Care [facility]) - Initial/Assessment Note    Patient Details  Name: Tammy Blair MRN: [id number] Date of Birth: 1951-05-02  [facility]) CM/SW Contact:    Shae Hinnenkamp, Marjie Skiff, RN Phone Number: 04/14/2021, 12:38 PM  Clinical Narrative:                 Pt has declined CIR and states that she is going home with her family. She lives with son and daughter. She is on chronic home 02. Family asked to bring travel 02 tank. Pt requesting Hemi walker and 3 in 1. Both ordered from [place]. Home Health set up with [facility].  Expected Discharge Plan: [place] Barriers to Discharge: No Barriers Identified   Patient Goals and CMS Choice Patient states their goals for this hospitalization and ongoing recovery are:: To go home CMS Medicare.gov Compare Post Acute Care list provided to:: Patient Choice offered to / list presented to : Patient  Expected Discharge Plan and Services Expected Discharge Plan: [place]   Discharge Planning Services: CM Consult Post Acute Care Choice: [employer] arrangements for the past 2 months: Mobile Home Expected Discharge Date: 04/13/21               DME Arranged: 3-N-1,Walker hemi DME Agency: [employer]       HH Arranged: PT,RN,OT,Nurse's Aide [place]: [place] Date [facility] Agency Contacted: 04/14/21 Time HH Agency Contacted: 36 Representative spoke with at [place]: Tommi Rumps  Prior Living Arrangements/Services Living arrangements for the past 2 months: Mobile Home Lives with:: Adult Children Patient language and need for interpreter reviewed:: Yes Do you feel safe going back to the place where you live?: Yes      Need for Family Participation in Patient Care: Yes (Comment) Care giver support system in place?: Yes (comment)   Criminal Activity/Legal Involvement Pertinent to Current Situation/Hospitalization: No - Comment as needed  Activities of Daily Living Home Assistive  Devices/Equipment: Dentures (specify type),Walker (specify type) ADL Screening (condition at time of admission) Patient's cognitive ability adequate to safely complete daily activities?: Yes Is the patient deaf or have difficulty hearing?: Yes Does the patient have difficulty seeing, even when wearing glasses/contacts?: No Does the patient have difficulty concentrating, remembering, or making decisions?: No Patient able to express need for assistance with ADLs?: Yes Does the patient have difficulty dressing or bathing?: Yes Independently performs ADLs?: No Communication: Independent Dressing (OT): Needs assistance Is this a change from baseline?: Change from baseline, expected to last <3days Grooming: Needs assistance Is this a change from baseline?: Change from baseline, expected to last <3 days Feeding: Independent Bathing: Needs assistance Is this a change from baseline?: Change from baseline, expected to last <3 days Toileting: Needs assistance Is this a change from baseline?: Change from baseline, expected to last <3 days In/Out Bed: Independent Walks in Home: Independent Does the patient have difficulty walking or climbing stairs?: Yes Weakness of Legs: Right Weakness of Arms/Hands: Right  Permission Sought/Granted Permission sought to share information with : Chartered certified accountant granted to share information with : Yes, Verbal Permission Granted     Permission granted to share info w AGENCY: [facility]        Emotional Assessment Appearance:: Appears older than stated age Attitude/Demeanor/Rapport: Gracious Affect (typically observed): Calm Orientation: : Oriented to Self,Oriented to Place,Oriented to Situation Alcohol / Substance Use: Not Applicable    Admission diagnosis:  Leukocytosis [D72.829] CVA (cerebral vascular  accident) [facility]) [I63.9] Cerebrovascular accident (CVA), unspecified mechanism ([place]) [I63.9] Stroke (cerebrum) [facility])  [I63.9] Patient Active Problem List   Diagnosis Date Noted  . Lung mass   . Adenopathy 04/09/2021  . Hypokalemia 04/08/2021  . Leukocytosis 04/08/2021  . 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 Right lower lobe lung mass   . Metastatic cancer to brain ([place])   . SOB (shortness of breath)   . CVA (cerebral vascular accident) ([place]) 04/07/2021  . Fever 09/17/2018  . Type 2 diabetes mellitus without complication ([place]) 40/76/8088  . COPD (chronic obstructive pulmonary disease) ([place]) 06/21/2018  . Acute metabolic encephalopathy 10/14/1593  . Acute and chronic respiratory failure with hypercapnia ([place])   . Sepsis secondary to UTI ([place]) 10/07/2017  . Tachycardia 10/07/2017  . Cocaine abuse ([place]) 10/07/2017  . Acute respiratory failure with hypercapnia ([place]) 08/11/2016  . Diabetes mellitus ([place]) 08/11/2016  . HOH (hard of hearing) 08/22/2013  . Hyperglycemia 08/21/2013  . COPD exacerbation ([place]) 02/18/2013  . GAD (generalized anxiety disorder) 02/18/2013  . Chronic pain syndrome 02/18/2013  . Tobacco use disorder 02/18/2013  . Chronic respiratory failure ([place]) 02/18/2013  . Obesity, unspecified 02/18/2013  . Essential hypertension 04/29/2009  . COPD UNSPECIFIED 04/29/2009  . WEIGHT GAIN, ABNORMAL 04/29/2009   PCP:  [name], MD Pharmacy:   [facility], [place] [address] Phone: [phone] Fax: [phone]     Social Determinants of Health (SDOH) Interventions    Readmission Risk Interventions Readmission Risk Prevention Plan 04/14/2021  Transportation Screening Complete  PCP or Specialist Appt within 3-5 Days Complete  HRI or [place] Complete  Social Work Consult for [place] Planning/Counseling Complete  Palliative Care Screening Not Applicable  Medication Review Press photographer) Complete  Some recent data might be hidden

## 2021-04-14 NOTE — Discharge Summary (Signed)
Physician Discharge Summary  Tammy Blair:[id number] DOB: 05/31/1951 DOA: 04/07/2021  PCP: [name], MD  Admit date: 04/07/2021 Discharge date: 04/14/2021  Admitted From: Home Disposition: Home  Recommendations for Outpatient Follow-up:  1. Follow ups as below. 2. Please obtain CBC/BMP/Mag at follow up 3. Recommend outpatient palliative follow-up 4. Patient to continue radiation therapy outpatient 5. Oncology and pulmonology to arrange outpatient follow-up. 6. Please follow up on the following pending results: Lung biopsy result  Home Health: PT/OT/RN/aide Equipment/Devices: [employer].  Patient has home oxygen.  Discharge Condition: Stable but guarded CODE STATUS: Full code   Follow-up Information    Schedule an appointment as soon as possible for a visit with Icard, Bradley L, DO.   Specialty: Pulmonary Disease Contact information: [address]   [address] [phone]        Corrington, Delsa Grana, MD. Schedule an appointment as soon as possible for a visit in 1 week(s).   Specialty: Family Medicine Contact information: [place] [address] [phone]        Care, [facility] Follow up.   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    Initially, concern about CVA.  CT head showed moderate to large area of low density in the left frontal lobe concerning for CVA.  However, MRI brain negative for CVA but  multiple intracranial lesions with surrounding vasogenic edema compatible with metastatic disease.  CT chest/abdomen/pelvis showed 3.7 cm  RUL lung mass with extensive thoracic and some lower neck adenopathy concerning for bronchogenic carcinoma with mets to brain.  CT also showed moderate pericardial effusion.  Oncology consulted and started patient on Decadron for vasogenic edema.  Palliative medicine and pulmonology consulted as well.  Patient transferred to [facility] for radiation therapy that she started on 5/2.  Underwent bronchoscopy with endobronchial [place] and biopsies by Dr. Valeta Harms on 5/3.  Biopsy pending.  Patient discharged on Decadron 4 mg every 8 hours per radiation oncology recommendation.  Also started on insulin due to hyperglycemia in the setting of steroid.  Patient son, Dellis Filbert to assist with CBG and insulin administration. He says, he has used insulin before.  Patient to continue evaluation therapy at [facility] long on daily basis.  Therapy recommended CIR but patient and family opted for home with home health and DME.  See individual problem list below for more on hospital course.  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  History of heavy tobacco use -Started radiation therapy on 5/2. -S/p bronchoscopy and biopsy on 5/3.  Biopsy pending. -Decadron 4 mg every 8 hours for vasogenic edema.  Radiation oncology to taper when appropriate -Outpatient follow-up with pulmonology and oncology -Palliative medicine recommended outpatient palliative follow-up.  Right arm weakness: Likely due to the above.  MRI negative for CVA -Therapy recommended CIR but patient and family opted for home with home health and DME..  Fall at home: Likely due to the above.  Did not hit her head or lose consciousness.  No traumatic injury. -Continue therapy as above -  Continue fall precaution  Mild to moderate pericardial effusion: Likely due to malignancy.  No clinical signs of tamponade.  Chronic COPD/RF-on 3 L at baseline.  Now saturating well on room air. -On Decadron  for vasogenic edema -Continue breathing treatments  Chronic low back pain/lumbar radiculopathy: No acute neuro findings in the lower extremities.  Asking for oxycodone to be increased although she is not on oxycodone at home.  She refuses to take Tylenol saying  her PCP advised her not to take.  Her LFTs normal. -Tylenol, gabapentin and Flexeril. -Sent Rx for oxycodone 5 mg every 8 hours as needed, #9.  Advised to contact PCP for more refills.  Controlled NIDDM-2 with hyperglycemia and hyperlipidemia: A1c 6.3%.  Hyperglycemia likely due to steroid.  Only on metformin at home.  She required 75 units of insulin in the last 24 hours. Recent Labs  Lab 04/13/21 1716 04/13/21 1938 04/13/21 2155 04/14/21 0805 04/14/21 1223  GLUCAP 276* 313* 190* 269* 126*  -Discharged on Novolin 70/30 at 30 units twice daily -Continue home metformin -Diabetic supplies including meter, strips, lancets and pen needles ordered. -Family, patient's son Dellis Filbert to assist with CBG monitoring and insulin administration -Continue statin.  Hyponatremia: Na 134 but corrects to normal for hyperglycemia  Hypokalemia: Resolved.  Tobacco use disorder- -encouraged tobacco cessation. -continue nicotine patch  Essential hypertension/history of tachycardia:  Normotensive.  HR improved. -Discharged on p.o. Cardizem CD 240 mg daily -Discontinued Cardizem IR and metoprolol.  Sinusitis? -Continue p.o. Augmentin for 4 more days to complete a total of 10 days  Euthyroid syndrome-TSH mildly elevated.  Free T4 within normal.  Goal of care: Patient with new metastatic lung cancer, chronic COPD with respiratory failure.  Poor long-term prognosis.  Still full code which I believe would pose more harm than benefit -Outpatient palliative medicine follow-up  Anxiety: Seems worse partly from steroid. -Continue Xanax 0.5 mg 3 times daily as needed.  Gave new Rx for #9.  Advised to contact PCP  Leukocytosis: Likely  demargination from steroid.  Overweight Body mass index is 29.82 kg/m.            Discharge Exam: Vitals:   04/14/21 0519 04/14/21 0759  BP: (!) 156/67   Pulse: (!) 56   Resp: 18   Temp: (!) 97.5 F (36.4 C)   SpO2: 97% 97%    GENERAL: No apparent distress.  Nontoxic. HEENT: MMM.  Vision grossly intact.  Diminished hearing. NECK: Supple.  No apparent JVD.  RESP: 97% on 3 L.  No IWOB.  Fair aeration bilaterally. CVS:  RRR. Heart sounds normal.  ABD/GI/GU: Bowel sounds present. Soft. Non tender.  MSK/EXT:  Moves extremities. No apparent deformity. No edema.  SKIN: no apparent skin lesion or wound NEURO: Awake, alert and oriented appropriately.  RUE weakness (3/5 with grip strength, flexion and shoulder abduction) PSYCH: Somewhat anxious.  Discharge Instructions  Discharge Instructions    Call MD for:  difficulty breathing, headache or visual disturbances   Complete by: As directed    Call MD for:  extreme fatigue   Complete by: As directed    Call MD for:  persistant dizziness or light-headedness   Complete by: As directed    Call MD for:  persistant nausea and vomiting   Complete by: As directed    Call MD for:  severe uncontrolled pain   Complete by: As directed    Diet - low sodium heart healthy   Complete by: As directed    Diet Carb  Modified   Complete by: As directed    Discharge instructions   Complete by: As directed    It has been a pleasure taking care of you!  You were hospitalized due to right arm weakness likely from lung cancer that has gone to your brain.  The MRI of your brain did not show stroke.  You have been started on radiation therapy for lung cancer that has gone to brain.  We have done biopsies for confirmation and further studies.  Someone will get in touch with you about the results on those biopsies.  Your oncologist and pulmonologist will arrange outpatient follow-up.   Please review your new medication list and the directions on  your medications before you take them.  It is important that you quit smoking cigarettes.  You may use nicotine patch to help you quit smoking.  Nicotine patch is available over-the-counter.  You may also discuss other options to help you quit smoking with your primary care doctor. You can also talk to professional counselors at 1-800-QUIT-NOW [phone]) for free smoking cessation counseling.    Take care,   Discharge instructions   Complete by: As directed    It has been a pleasure taking care of you!  You were hospitalized due to breast cancer that has spread to your brain.  The oncologist have started you on radiation treatment for cancer in your brain.  We have also started you on a steroid for brain swelling due to cancer in your brain.  Due to this a steroid, your blood sugar has been high.  We have started you on insulin to treat high blood sugar while you are on steroid.  Please review your new medication list and the directions on your medications before you take them.  Please follow-up with your primary care doctor in 1 to 2 weeks.  Your oncologist will arrange outpatient follow-up once they have the result of your lung biopsy.  Meanwhile, he will continue radiation treatment to your brain daily at [facility].  It is important that you quit smoking cigarettes.  You may use nicotine patch to help you quit smoking.  Nicotine patch is available over-the-counter.  You may also discuss other options to help you quit smoking with your primary care doctor. You can also talk to professional counselors at 1-800-QUIT-NOW [phone]) for free smoking cessation counseling.     Take care,   Increase activity slowly   Complete by: As directed    Increase activity slowly   Complete by: As directed      Allergies as of 04/14/2021   No Known Allergies     Medication List    STOP taking these medications   diltiazem 60 MG tablet Commonly known as: CARDIZEM   famotidine 20  MG tablet Commonly known as: PEPCID   metoprolol tartrate 100 MG tablet Commonly known as: LOPRESSOR   naproxen 500 MG tablet Commonly known as: Naprosyn   TRIAMCINOLONE ACETONIDE (TOP) 0.05 % Oint   valACYclovir 1000 MG tablet Commonly known as: VALTREX     TAKE these medications   acetaminophen 500 MG tablet Commonly known as: TYLENOL Take 1 tablet (500 mg total) by mouth every 8 (eight) hours.   albuterol 108 (90 Base) MCG/ACT inhaler Commonly known as: VENTOLIN HFA Inhale 1-2 puffs into the lungs every 4 (four) hours as needed for wheezing or shortness of breath. What changed: Another medication with the same name was changed. Make sure you understand how and when to  take each.   albuterol (2.5 MG/3ML) 0.083% nebulizer solution Commonly known as: PROVENTIL Take 3 mLs (2.5 mg total) by nebulization every 4 (four) hours as needed for wheezing or shortness of breath. What changed:   reasons to take this  additional instructions   ALPRAZolam 0.5 MG tablet Commonly known as: XANAX Take 1 tablet (0.5 mg total) by mouth 3 (three) times daily as needed for up to 3 days for anxiety. What changed: additional instructions   amoxicillin-clavulanate 875-125 MG tablet Commonly known as: AUGMENTIN Take 1 tablet by mouth every 12 (twelve) hours.   aspirin 81 MG EC tablet Take 1 tablet (81 mg total) by mouth daily. Swallow whole. Start taking on: Apr 15, 2021   atorvastatin 40 MG tablet Commonly known as: LIPITOR Take 1 tablet (40 mg total) by mouth daily. What changed:   medication strength  how much to take   blood glucose meter kit and supplies Dispense based on patient and insurance preference. Use up to 3 times daily as directed. (FOR ICD-10 E10.9, E11.9).   budesonide-formoterol 160-4.5 MCG/ACT inhaler Commonly known as: SYMBICORT Inhale 1 puff into the lungs 2 (two) times daily.   chlorthalidone 25 MG tablet Commonly known as: HYGROTON Take 25 mg by mouth  daily. With food   cyclobenzaprine 10 MG tablet Commonly known as: FLEXERIL Take 0.5 tablets (5 mg total) by mouth 3 (three) times daily as needed for muscle spasms. For muscle spasm   dexamethasone 4 MG tablet Commonly known as: DECADRON Take 1 tablet (4 mg total) by mouth 3 (three) times daily.   diltiazem 240 MG 24 hr capsule Commonly known as: CARDIZEM CD Take 1 capsule (240 mg total) by mouth daily.   gabapentin 300 MG capsule Commonly known as: NEURONTIN Take 1 capsule (300 mg total) by mouth 3 (three) times daily. What changed: how much to take   malathion 0.5 % lotion Commonly known as: OVIDE Apply topically.   metFORMIN 500 MG 24 hr tablet Commonly known as: GLUCOPHAGE-XR Take 1 tablet (500 mg total) by mouth in the morning and at bedtime. What changed: additional instructions   nicotine 21 mg/24hr patch Commonly known as: NICODERM CQ - dosed in mg/24 hours Place 1 patch (21 mg total) onto the skin daily.   NovoLIN 70/30 FlexPen Relion (70-30) 100 UNIT/ML KwikPen Generic drug: insulin isophane & regular human Inject 30 units subcutaneously twice a day before breakfast and dinner   ondansetron 4 MG tablet Commonly known as: Zofran Take 1 tablet (4 mg total) by mouth daily as needed for nausea or vomiting.   Pen Needles 31G X 6 MM Misc 1 pen by Does not apply route in the morning and at bedtime.   tiotropium 18 MCG inhalation capsule Commonly known as: SPIRIVA Place 1 capsule (18 mcg total) into inhaler and inhale daily.   umeclidinium bromide 62.5 MCG/INH Aepb Commonly known as: INCRUSE ELLIPTA Inhale 1 puff into the lungs daily. Start taking on: Apr 15, 2021            Durable Medical Equipment  (From admission, onward)         Start     Ordered   04/14/21 1401  For home use only DME oxygen  Once       Question Answer Comment  Length of Need 6 Months   Liters per Minute 2   Frequency Continuous (stationary and portable oxygen unit needed)    Oxygen delivery system Gas      04/14/21  1400   04/14/21 1011  For home use only DME 3 n 1  Once        04/14/21 1010   04/13/21 1434  For home use only DME Other see comment  Once       Comments: [facility]  Question:  Length of Need  Answer:  6 Months   04/13/21 1433          Consultations:  Oncology  Radiation oncology  Pulmonology  Palliative medicine  Procedures/Studies:  5/2-started radiation therapy  5/3-bronchoscopy with biopsies   CT Head Wo Contrast  Result Date: 04/07/2021 CLINICAL DATA:  Right arm weakness for the past week. EXAM: CT HEAD WITHOUT CONTRAST TECHNIQUE: Contiguous axial images were obtained from the base of the skull through the vertex without intravenous contrast. COMPARISON:  Head CT 10/07/2017 FINDINGS: Brain: Moderately large area of low-density in the subcortical white matter of the left frontal lobe suspicious for subacute infarct. Small area of subcortical low-density involving the posterior right frontal lobe may also represent subacute ischemia. No hemorrhage. Findings are new from 2018 exam. No hydrocephalus or midline shift. No subdural or extra-axial collection. Vascular: There is high-density within both the left and right MCAs, however this is more prominent on the left. Skull: No fracture or focal lesion. Sinuses/Orbits: Left maxillary sinus mucosal thickening with bubbly debris and fluid level. Prior left mastoidectomy. Opacification of right mastoid air cells. Other: None. IMPRESSION: 1. Moderately large area of low-density in the subcortical white matter of the left frontal lobe suspicious for subacute infarct. Small area of subcortical low-density involving the posterior right frontal lobe may also represent subacute ischemia. Findings are new from 2018 exam. Consider MRI for confirmation. 2. High-density within both the left and right MCAs, however this is more prominent on the left. CTA or MRA recommended. 3. Left maxillary sinus  mucosal thickening with bubbly debris and fluid level, can be seen with acute sinusitis. Electronically Signed   By: Keith Rake M.D.   On: 04/07/2021 21:30   MR ANGIO HEAD WO CONTRAST  Result Date: 04/08/2021 CLINICAL DATA:  Acute neuro deficit. Right arm weakness. Lung mass on chest x-ray EXAM: MRI HEAD WITHOUT CONTRAST MRA HEAD WITHOUT CONTRAST TECHNIQUE: Multiplanar, multiecho pulse sequences of the brain and surrounding structures were obtained without intravenous contrast. Angiographic images of the head were obtained using MRA technique without contrast. COMPARISON:  CT head 04/07/2021 FINDINGS: MRI HEAD FINDINGS Brain: Negative for acute infarct. Moderately large area of vasogenic edema in the left frontal parietal lobe as noted on CT. There is an associated 15 mm mass in the left precentral gyrus. Second area vasogenic edema is smaller in the right parietal lobe also with a associated small mass lesion. Mass is best seen on FLAIR and diffusion weighted imaging. Additional small areas of edema in the right frontal lobe and right pericallosal white matter also suspicious for tumor. Separate areas of vasogenic edema in the right parietal lobe and right occipital lobe with associated small mass lesions. Small areas of FLAIR hyperintensity in the cerebellum bilaterally suspicious for metastatic disease. No acute hemorrhage.  Ventricle size normal.  No midline shift. Vascular: Normal arterial flow voids. Skull and upper cervical spine: No suspicious skeletal lesions. Sinuses/Orbits: Extensive mucosal edema and air-fluid level left maxillary sinus. Negative orbit. Bilateral mastoid effusion Other: None MRA HEAD FINDINGS Right vertebral artery dominant and widely patent. Left vertebral artery is hypoplastic distally but does contribute to the basilar. PICA patent bilaterally. Atherosclerotic irregularity in the basilar  with moderate stenosis in the distal basilar. Posterior cerebral arteries patent  bilaterally without significant stenosis Atherosclerotic irregularity and stenosis in the cavernous carotid bilaterally. Anterior and middle cerebral arteries patent bilaterally without stenosis or large vessel occlusion. Negative for cerebral aneurysm. IMPRESSION: 1. Negative for acute infarct 2. Multiple intracranial mass lesions with surrounding vasogenic edema compatible with metastatic disease. Lung mass noted on chest x-ray 04/07/2021. Recommend follow-up MRI brain with contrast to evaluate metastatic disease 3. Intracranial atherosclerotic disease especially the basilar and posterior cerebral arteries bilaterally. Negative for large vessel occlusion. Electronically Signed   By: Franchot Gallo M.D.   On: 04/08/2021 13:15   MR BRAIN WO CONTRAST  Result Date: 04/08/2021 CLINICAL DATA:  Acute neuro deficit. Right arm weakness. Lung mass on chest x-ray EXAM: MRI HEAD WITHOUT CONTRAST MRA HEAD WITHOUT CONTRAST TECHNIQUE: Multiplanar, multiecho pulse sequences of the brain and surrounding structures were obtained without intravenous contrast. Angiographic images of the head were obtained using MRA technique without contrast. COMPARISON:  CT head 04/07/2021 FINDINGS: MRI HEAD FINDINGS Brain: Negative for acute infarct. Moderately large area of vasogenic edema in the left frontal parietal lobe as noted on CT. There is an associated 15 mm mass in the left precentral gyrus. Second area vasogenic edema is smaller in the right parietal lobe also with a associated small mass lesion. Mass is best seen on FLAIR and diffusion weighted imaging. Additional small areas of edema in the right frontal lobe and right pericallosal white matter also suspicious for tumor. Separate areas of vasogenic edema in the right parietal lobe and right occipital lobe with associated small mass lesions. Small areas of FLAIR hyperintensity in the cerebellum bilaterally suspicious for metastatic disease. No acute hemorrhage.  Ventricle size  normal.  No midline shift. Vascular: Normal arterial flow voids. Skull and upper cervical spine: No suspicious skeletal lesions. Sinuses/Orbits: Extensive mucosal edema and air-fluid level left maxillary sinus. Negative orbit. Bilateral mastoid effusion Other: None MRA HEAD FINDINGS Right vertebral artery dominant and widely patent. Left vertebral artery is hypoplastic distally but does contribute to the basilar. PICA patent bilaterally. Atherosclerotic irregularity in the basilar with moderate stenosis in the distal basilar. Posterior cerebral arteries patent bilaterally without significant stenosis Atherosclerotic irregularity and stenosis in the cavernous carotid bilaterally. Anterior and middle cerebral arteries patent bilaterally without stenosis or large vessel occlusion. Negative for cerebral aneurysm. IMPRESSION: 1. Negative for acute infarct 2. Multiple intracranial mass lesions with surrounding vasogenic edema compatible with metastatic disease. Lung mass noted on chest x-ray 04/07/2021. Recommend follow-up MRI brain with contrast to evaluate metastatic disease 3. Intracranial atherosclerotic disease especially the basilar and posterior cerebral arteries bilaterally. Negative for large vessel occlusion. Electronically Signed   By: Franchot Gallo M.D.   On: 04/08/2021 13:15   MR BRAIN W CONTRAST  Result Date: 04/08/2021 CLINICAL DATA:  Multiple masses on noncontrast MRI EXAM: MRI HEAD WITH CONTRAST TECHNIQUE: Multiplanar, multiecho pulse sequences of the brain and surrounding structures were obtained with intravenous contrast. CONTRAST:  49mL GADAVIST GADOBUTROL 1 MMOL/ML IV SOLN COMPARISON:  Earlier same day FINDINGS: Approximately 16 enhancing lesions are identified. The largest in the posterior left frontal lobe measures 1.5 cm involving the precentral gyrus. Small cerebellar lesions are present. Questionable No new finding. 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 Electronically Signed    By: Macy Mis M.D.   On: 04/08/2021 18:21   CT CHEST ABDOMEN PELVIS W CONTRAST  Result Date: 04/08/2021 CLINICAL DATA:  Right upper lobe lung mass on chest radiography, with  intracranial metastatic disease. Staging workup. EXAM: CT CHEST, ABDOMEN, AND PELVIS WITH CONTRAST TECHNIQUE: Multidetector CT imaging of the chest, abdomen and pelvis was performed following the standard protocol during bolus administration of intravenous contrast. CONTRAST:  134mL OMNIPAQUE IOHEXOL 300 MG/ML  SOLN COMPARISON:  Chest radiograph 04/07/2021 and CT chest from 10/08/2017 FINDINGS: CT CHEST FINDINGS Cardiovascular: Coronary, aortic arch, and branch vessel atherosclerotic vascular disease. Moderate pericardial effusion. Mediastinum/Nodes: There is bilateral supraclavicular, bilateral hilar, bilateral subpectoral, prevascular, paratracheal, AP window, right hilar, and subcarinal pathologic adenopathy. Index right paratracheal node 2.6 cm in short axis, image 25 series 2. Index right hilar node 2.5 cm in short axis, image 29 series 2. There is also lower neck adenopathy in the level IV stations bilaterally, including a 1.0 cm right level IV lymph node on image 7 series 2. Lungs/Pleura: 3.7 by 3.7 cm right upper lobe mass, image 59 series 4. Part of this abuts the major fissure. Mild atelectasis medially in the right middle lobe. Airway thickening with airway plugging in the right lower lobe. 0.7 by 0.9 cm left upper lobe nodule medially on image 65 of series 4. Bandlike density in the lingula. Bandlike density with some nodularity measuring up to 1.0 cm in thickness in the left lower lobe (image 91, series 4). Mild airway thickening in the left lower lobe. 0.5 cm nodule in the superior segment left lower lobe, image 64 series 4. Musculoskeletal: Unremarkable CT ABDOMEN PELVIS FINDINGS Hepatobiliary: Nodular liver contour suspicious for cirrhosis. No metastatic lesion is identified in the liver. Upper normal size of the  gallbladder without overt gallbladder wall thickening. No biliary dilatation. Pancreas: Unremarkable Spleen: Unremarkable Adrenals/Urinary Tract: Both adrenal glands appear normal. The kidneys and urinary bladder appear normal. Stomach/Bowel: Unremarkable Vascular/Lymphatic: Aortoiliac atherosclerotic vascular disease. Mildly enlarged 1.1 cm in short axis porta hepatis lymph node on image 57 series 2, possibly reactive given the underlying cirrhosis. Portacaval node 1.2 cm in short axis, image 60 series 2. Reproductive: Unremarkable Other: Small collections of fluid and gas along the anterior abdominal wall are probably injection related. Musculoskeletal: Lumbar spondylosis and degenerative disc disease causing bilateral foraminal impingement at L4-5 and L5-S1 and left foraminal impingement at L3-4. IMPRESSION: 1. 3.7 cm right upper lobe lung mass, with extensive thoracic and some lower neck adenopathy. In the context of the patient's intracranial metastatic deposits, this likely represents metastatic right upper lobe bronchogenic carcinoma. 2. 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 3. Moderate pericardial effusion. 4. Airway thickening in the lower lobes with some mild airway plugging in the right lower lobe. 5. Two small left-sided pulmonary nodules are indeterminate but merit surveillance. 6. Other imaging findings of potential clinical significance: Aortic Atherosclerosis (ICD10-I70.0). Coronary atherosclerosis. Multilevel lumbar impingement Electronically Signed   By: Van Clines M.D.   On: 04/08/2021 18:46   DG CHEST PORT 1 VIEW  Result Date: 04/07/2021 CLINICAL DATA:  Cough EXAM: PORTABLE CHEST 1 VIEW COMPARISON:  09/27/2018 FINDINGS: The lungs are symmetrically well expanded. A 3.6 cm mass is developed within the right upper lobe. No pneumothorax or pleural  effusion. Cardiac size within normal limits. Pulmonary vascularity is normal. No acute bone abnormality. IMPRESSION: Interval development of a 3.6 cm mass within the right upper lobe. Dedicated CT imaging is recommended for further evaluation. Electronically Signed   By: Fidela Salisbury MD   On: 04/07/2021 23:26   ECHOCARDIOGRAM COMPLETE  Result Date: 04/09/2021    ECHOCARDIOGRAM REPORT   Patient Name:   NASHANTI DUQUETTE [facility] Date of Exam: 04/09/2021 Medical Rec #:  [id number]   Height:       69.0 in Accession #:    [id number]  Weight:       201.9 lb Date of Birth:  January 31, 1951    BSA:          2.075 m Patient Age:    54 years    BP:           96/84 mmHg Patient Gender: F           HR:           109 bpm. Exam Location:  [facility] Procedure: 2D Echo, Cardiac Doppler and Color Doppler Indications:    CVA  History:        Patient has prior history of Echocardiogram examinations, most                 recent 10/07/2017. Stroke and COPD; Risk Factors:Current Smoker,                 Hypertension, Diabetes and Dyslipidemia. Lung mass with Brain                 mets.  Sonographer:    Dustin Flock RDCS Referring Phys: [id number] ASIA B [place]  1. Left ventricular ejection fraction, by estimation, is 65 to 70%. The left ventricle has normal function. The left ventricle has no regional wall motion abnormalities. There is mild left ventricular hypertrophy. Left ventricular diastolic parameters are indeterminate.  2. Right ventricular systolic function is normal. The right ventricular size is normal. There is normal pulmonary artery systolic pressure. The estimated right ventricular systolic pressure is 24.2 mmHg.  3. Small to moderate pericardial effusion. The pericardial effusion is posterior to the left ventricle, lateral to the left ventricle and anterior to the right ventricle. There is no evidence of cardiac tamponade. Fibrinous material noted suggesting relative chronicity.  4. The mitral valve is grossly normal.  Trivial mitral valve regurgitation.  5. The aortic valve is tricuspid. Aortic valve regurgitation is not visualized.  6. The inferior vena cava is dilated in size with >50% respiratory variability, suggesting right atrial pressure of 8 mmHg. FINDINGS  Left Ventricle: Left ventricular ejection fraction, by estimation, is 65 to 70%. The left ventricle has normal function. The left ventricle has no regional wall motion abnormalities. The left ventricular internal cavity size was normal in size. There is  mild left ventricular hypertrophy. Left ventricular diastolic parameters are indeterminate. Right Ventricle: The right ventricular size is normal. No increase in right ventricular wall thickness. Right ventricular systolic function is normal. There is normal pulmonary artery systolic pressure. The tricuspid regurgitant velocity is 1.71 m/s, and  with an assumed right atrial pressure of 8 mmHg, the estimated right ventricular systolic pressure is 35.3 mmHg. Left Atrium: Left atrial size was normal in size. Right Atrium: Right atrial size was normal in size. Pericardium: A moderately sized pericardial effusion is present. The pericardial effusion is posterior to the left ventricle, lateral to the left ventricle and anterior to the right ventricle. There is no evidence of cardiac tamponade. Mitral Valve: The mitral valve is grossly normal. Trivial mitral valve regurgitation. Tricuspid Valve: The tricuspid valve is grossly normal. Tricuspid valve regurgitation is trivial. Aortic Valve: The aortic valve is tricuspid. There is mild aortic valve annular calcification. Aortic valve regurgitation is not visualized. Pulmonic Valve:  The pulmonic valve was grossly normal. Pulmonic valve regurgitation is trivial. Aorta: The aortic root is normal in size and structure. Venous: The inferior vena cava is dilated in size with greater than 50% respiratory variability, suggesting right atrial pressure of 8 mmHg. IAS/Shunts: The  interatrial septum appears to be lipomatous. No atrial level shunt detected by color flow Doppler.  LEFT VENTRICLE PLAX 2D LVIDd:         4.16 cm  Diastology LVIDs:         2.41 cm  LV e' medial:    6.20 cm/s LV PW:         1.16 cm  LV E/e' medial:  11.6 LV IVS:        1.21 cm  LV e' lateral:   4.46 cm/s LVOT diam:     2.00 cm  LV E/e' lateral: 16.1 LV SV:         67 LV SV Index:   32 LVOT Area:     3.14 cm  RIGHT VENTRICLE RV Basal diam:  2.95 cm RV S prime:     11.30 cm/s TAPSE (M-mode): 2.7 cm LEFT ATRIUM             Index       RIGHT ATRIUM           Index LA diam:        3.30 cm 1.59 cm/m  RA Area:     12.70 cm LA Vol (A2C):   39.2 ml 18.90 ml/m RA Volume:   29.20 ml  14.08 ml/m LA Vol (A4C):   28.8 ml 13.88 ml/m LA Biplane Vol: 34.3 ml 16.53 ml/m  AORTIC VALVE LVOT Vmax:   118.00 cm/s LVOT Vmean:  78.500 cm/s LVOT VTI:    0.212 m  AORTA Ao Root diam: 2.50 cm MITRAL VALVE               TRICUSPID VALVE MV Area (PHT): 3.91 cm    TR Peak grad:   11.7 mmHg MV Decel Time: 194 msec    TR Vmax:        171.00 cm/s MV E velocity: 72.00 cm/s MV A velocity: 88.30 cm/s  SHUNTS MV E/A ratio:  0.82        Systemic VTI:  0.21 m                            Systemic Diam: 2.00 cm Rozann Lesches MD Electronically signed by Rozann Lesches MD Signature Date/Time: 04/09/2021/4:09:53 PM    Final        The results of significant diagnostics from this hospitalization (including imaging, microbiology, ancillary and laboratory) are listed below for reference.     Microbiology: Recent Results (from the past 240 hour(s))  Resp Panel by RT-PCR (Flu A&B, Covid) Nasopharyngeal Swab     Status: None   Collection Time: 04/07/21 11:02 PM   Specimen: Nasopharyngeal Swab; Nasopharyngeal(NP) swabs in vial transport medium  Result Value Ref Range Status   SARS Coronavirus 2 by RT PCR NEGATIVE NEGATIVE Final    Comment: (NOTE) SARS-CoV-2 target nucleic acids are NOT DETECTED.  The SARS-CoV-2 RNA is generally detectable in  upper respiratory specimens during the acute phase of infection. The lowest concentration of SARS-CoV-2 viral copies this assay can detect is 138 copies/mL. A negative result does not preclude SARS-Cov-2 infection and should not be used as the sole basis for treatment or other patient management decisions.  A negative result may occur with  improper specimen collection/handling, submission of specimen other than nasopharyngeal swab, presence of viral mutation(s) within the areas targeted by this assay, and inadequate number of viral copies(<138 copies/mL). A negative result must be combined with clinical observations, patient history, and epidemiological information. The expected result is Negative.  Fact Sheet for Patients:  EntrepreneurPulse.com.au  Fact Sheet for Healthcare Providers:  IncredibleEmployment.be  This test is no t yet approved or cleared by the [place] FDA and  has been authorized for detection and/or diagnosis of SARS-CoV-2 by FDA under an Emergency Use Authorization (EUA). This EUA will remain  in effect (meaning this test can be used) for the duration of the COVID-19 declaration under Section 564(b)(1) of the Act, 21 U.S.C.section 360bbb-3(b)(1), unless the authorization is terminated  or revoked sooner.       Influenza A by PCR NEGATIVE NEGATIVE Final   Influenza B by PCR NEGATIVE NEGATIVE Final    Comment: (NOTE) The Xpert Xpress SARS-CoV-2/FLU/RSV plus assay is intended as an aid in the diagnosis of influenza from Nasopharyngeal swab specimens and should not be used as a sole basis for treatment. Nasal washings and aspirates are unacceptable for Xpert Xpress SARS-CoV-2/FLU/RSV testing.  Fact Sheet for Patients: EntrepreneurPulse.com.au  Fact Sheet for Healthcare Providers: IncredibleEmployment.be  This test is not yet approved or cleared by the [place] FDA and has been  authorized for detection and/or diagnosis of SARS-CoV-2 by FDA under an Emergency Use Authorization (EUA). This EUA will remain in effect (meaning this test can be used) for the duration of the COVID-19 declaration under Section 564(b)(1) of the Act, 21 U.S.C. section 360bbb-3(b)(1), unless the authorization is terminated or revoked.  Performed at [facility], [address] [place], [place] [address]      Labs:  CBC: Recent Labs  Lab 04/07/21 2107 04/11/21 0554 04/12/21 0553 04/13/21 0522 04/14/21 0524  WBC 12.5* 22.6* 18.5* 18.3* 22.3*  NEUTROABS 8.4*  --  16.3*  --   --   HGB 15.0 14.1 14.7 14.8 13.8  HCT 46.0 42.1 44.1 44.4 40.3  MCV 96.8 93.6 94.8 94.7 91.0  PLT 301 277 292 244 225   BMP &GFR Recent Labs  Lab 04/07/21 2107 04/11/21 0554 04/12/21 0553 04/13/21 0522 04/14/21 0524  NA 134* 135 139 134* 134*  K 3.3* 3.4* 3.5 4.1 4.2  CL 90* 94* 94* 93* 95*  CO2 35* 31 33* 31 29  GLUCOSE 111* 188* 223* 169* 189*  BUN 18 22 32* 29* 24*  CREATININE 0.63 0.60 0.65 0.66 0.54  CALCIUM 9.3 9.3 9.3 9.1 9.1  MG  --  2.0 2.2 2.2 2.2  PHOS  --  3.4 3.3 3.3 3.8   Estimated Creatinine Clearance: 81.2 mL/min (by C-G formula based on SCr of 0.54 mg/dL). Liver & Pancreas: Recent Labs  Lab 04/07/21 2107 04/11/21 0554 04/12/21 0553 04/13/21 0522 04/14/21 0524  AST 27  --   --   --   --   ALT 28  --   --   --   --   ALKPHOS 58  --   --   --   --   BILITOT 0.5  --   --   --   --   PROT 8.2*  --   --   --   --   ALBUMIN 3.9 3.6 3.7 3.4* 3.3*   No results for input(s): LIPASE, AMYLASE in the last 168 hours. No results for input(s): AMMONIA in  the last 168 hours. Diabetic: No results for input(s): HGBA1C in the last 72 hours. Recent Labs  Lab 04/13/21 1716 04/13/21 1938 04/13/21 2155 04/14/21 0805 04/14/21 1223  GLUCAP 276* 313* 190* 269* 126*   Cardiac Enzymes: No results for input(s): CKTOTAL, CKMB, CKMBINDEX, TROPONINI in the last 168 hours. No results for  input(s): PROBNP in the last 8760 hours. Coagulation Profile: No results for input(s): INR, PROTIME in the last 168 hours. Thyroid Function Tests: No results for input(s): TSH, T4TOTAL, FREET4, T3FREE, THYROIDAB in the last 72 hours. Lipid Profile: No results for input(s): CHOL, HDL, LDLCALC, TRIG, CHOLHDL, LDLDIRECT in the last 72 hours. Anemia Panel: No results for input(s): VITAMINB12, FOLATE, FERRITIN, TIBC, IRON, RETICCTPCT in the last 72 hours. Urine analysis:    Component Value Date/Time   COLORURINE YELLOW 04/08/2021 1600   APPEARANCEUR CLEAR 04/08/2021 1600   LABSPEC 1.012 04/08/2021 1600   PHURINE 6.0 04/08/2021 1600   GLUCOSEU NEGATIVE 04/08/2021 1600   HGBUR NEGATIVE 04/08/2021 1600   BILIRUBINUR NEGATIVE 04/08/2021 1600   KETONESUR NEGATIVE 04/08/2021 1600   PROTEINUR NEGATIVE 04/08/2021 1600   UROBILINOGEN 0.2 07/17/2014 1613   NITRITE NEGATIVE 04/08/2021 1600   LEUKOCYTESUR SMALL (A) 04/08/2021 1600   Sepsis Labs: Invalid input(s): PROCALCITONIN, LACTICIDVEN   Time coordinating discharge: 50 minutes  SIGNED:  Mercy Riding, MD  [facility] 04/14/2021, 6:59 PM  If 7PM-7AM, please contact night-coverage [email or web address]

## 2021-04-14 NOTE — Progress Notes (Signed)
Inpatient Rehabilitation Admissions Coordinator   Noted plans for d/c . We will sign off.  Danne Baxter, RN, MSN Rehab Admissions Coordinator [phone] 04/14/2021 2:13 PM

## 2021-04-14 NOTE — Progress Notes (Signed)
OT Cancellation Note  Patient Details Name: Tammy Blair MRN: [id number] DOB: 08-29-1951   Cancelled Treatment:    Reason Eval/Treat Not Completed: Patient at procedure or test/ unavailable  Julien Girt 04/14/2021, 1:58 PM

## 2021-04-14 NOTE — Progress Notes (Signed)
RN asked daughter to bring patients oxygen tank. Daughter said she was not going to get oxygen tank. Daughter is aware we cannot supply an oxygen tank. Patient made aware of situation and states she "will just wait to get a tank when she is discharged."

## 2021-04-15 ENCOUNTER — Encounter (HOSPITAL_COMMUNITY): Payer: Self-pay | Admitting: Pulmonary Disease

## 2021-04-15 ENCOUNTER — Other Ambulatory Visit: Payer: Self-pay

## 2021-04-15 ENCOUNTER — Ambulatory Visit
Admission: RE | Admit: 2021-04-15 | Discharge: 2021-04-15 | Disposition: A | Discharge status: Home / Self Care | Payer: Medicare Other | Source: Ambulatory Visit | Attending: Radiation Oncology | Admitting: Radiation Oncology

## 2021-04-15 DIAGNOSIS — Z510 Encounter for antineoplastic radiation therapy: Secondary | ICD-10-CM | POA: Insufficient documentation

## 2021-04-15 DIAGNOSIS — C7931 Secondary malignant neoplasm of brain: Secondary | ICD-10-CM | POA: Diagnosis present

## 2021-04-15 DIAGNOSIS — C3411 Malignant neoplasm of upper lobe, right bronchus or lung: Secondary | ICD-10-CM | POA: Diagnosis present

## 2021-04-16 ENCOUNTER — Telehealth: Payer: Self-pay | Admitting: Radiation Oncology

## 2021-04-16 ENCOUNTER — Ambulatory Visit
Admission: RE | Admit: 2021-04-16 | Discharge: 2021-04-16 | Disposition: A | Discharge status: Home / Self Care | Payer: Medicare Other | Source: Ambulatory Visit | Attending: Radiation Oncology | Admitting: Radiation Oncology

## 2021-04-16 ENCOUNTER — Other Ambulatory Visit: Payer: Self-pay | Admitting: Radiation Oncology

## 2021-04-16 DIAGNOSIS — Z510 Encounter for antineoplastic radiation therapy: Secondary | ICD-10-CM | POA: Diagnosis not present

## 2021-04-16 DIAGNOSIS — M545 Low back pain, unspecified: Secondary | ICD-10-CM

## 2021-04-16 LAB — CYTOLOGY - NON PAP

## 2021-04-16 MED ORDER — ALPRAZOLAM 0.5 MG PO TABS: 0.5000 mg | ORAL_TABLET | Freq: Three times a day (TID) | ORAL | 0 refills | Status: DC | PRN

## 2021-04-16 MED ORDER — OXYCODONE HCL 5 MG PO TABS: 5.0000 mg | ORAL_TABLET | Freq: Four times a day (QID) | ORAL | 0 refills | Status: DC | PRN

## 2021-04-16 NOTE — Telephone Encounter (Signed)
Phoned Beth at [facility] per Dr. Johny Shears request. Inquired if they received an electronic prescription for this patient's oxy IR and xanax. Beth reports they did not. She is going to have someone at the pharmacy check their system since they haven't received any electronic scripts in the last 30 minutes (this is unusual for them). She plans to call this RN back with an update.Will update [name] and proceed as necessary after hearing back from [place].

## 2021-04-16 NOTE — Telephone Encounter (Signed)
I have not received an update from [place] at [facility] about this patient's xanax and oxy ir scripts. Phoned back to inquire. Spoke with [employer]. Josh explains the xanax script was eventually received electronically but the oxy was not. Immediately paged Dr. [name] with these findings.

## 2021-04-19 ENCOUNTER — Ambulatory Visit
Admission: RE | Admit: 2021-04-19 | Discharge: 2021-04-19 | Disposition: A | Discharge status: Home / Self Care | Payer: Medicare Other | Source: Ambulatory Visit | Attending: Radiation Oncology | Admitting: Radiation Oncology

## 2021-04-19 ENCOUNTER — Other Ambulatory Visit: Payer: Self-pay | Admitting: Radiation Oncology

## 2021-04-19 ENCOUNTER — Other Ambulatory Visit: Payer: Self-pay

## 2021-04-19 ENCOUNTER — Other Ambulatory Visit: Payer: Self-pay | Admitting: Student

## 2021-04-19 DIAGNOSIS — C7931 Secondary malignant neoplasm of brain: Secondary | ICD-10-CM

## 2021-04-19 DIAGNOSIS — Z510 Encounter for antineoplastic radiation therapy: Secondary | ICD-10-CM | POA: Diagnosis not present

## 2021-04-19 DIAGNOSIS — C3411 Malignant neoplasm of upper lobe, right bronchus or lung: Secondary | ICD-10-CM

## 2021-04-19 MED ORDER — UMECLIDINIUM BROMIDE 62.5 MCG/INH IN AEPB: 1.0000 | INHALATION_SPRAY | Freq: Every day | RESPIRATORY_TRACT | 1 refills | Status: AC

## 2021-04-19 NOTE — Progress Notes (Signed)
Received prior authorization request from a pharmacy on his Spiriva.  She is already on Incruse Ellipta.  Renewed her Incruse Ellipta and sent a message to pharmacy to discontinue Spiriva.

## 2021-04-20 ENCOUNTER — Ambulatory Visit
Admission: RE | Admit: 2021-04-20 | Discharge: 2021-04-20 | Disposition: A | Discharge status: Home / Self Care | Payer: Medicare Other | Source: Ambulatory Visit | Attending: Radiation Oncology | Admitting: Radiation Oncology

## 2021-04-20 DIAGNOSIS — Z510 Encounter for antineoplastic radiation therapy: Secondary | ICD-10-CM | POA: Diagnosis not present

## 2021-04-21 ENCOUNTER — Ambulatory Visit
Admission: RE | Admit: 2021-04-21 | Discharge: 2021-04-21 | Disposition: A | Discharge status: Home / Self Care | Payer: Medicare Other | Source: Ambulatory Visit | Attending: Radiation Oncology | Admitting: Radiation Oncology

## 2021-04-21 DIAGNOSIS — Z510 Encounter for antineoplastic radiation therapy: Secondary | ICD-10-CM | POA: Diagnosis not present

## 2021-04-22 ENCOUNTER — Other Ambulatory Visit: Payer: Self-pay | Admitting: Radiation Oncology

## 2021-04-22 ENCOUNTER — Ambulatory Visit
Admission: RE | Admit: 2021-04-22 | Discharge: 2021-04-22 | Disposition: A | Discharge status: Home / Self Care | Payer: Medicare Other | Source: Ambulatory Visit | Attending: Radiation Oncology | Admitting: Radiation Oncology

## 2021-04-22 DIAGNOSIS — M545 Low back pain, unspecified: Secondary | ICD-10-CM

## 2021-04-22 DIAGNOSIS — Z510 Encounter for antineoplastic radiation therapy: Secondary | ICD-10-CM | POA: Diagnosis not present

## 2021-04-22 MED ORDER — NYSTATIN 100000 UNIT/ML MT SUSP: 5.0000 mL | Freq: Four times a day (QID) | OROMUCOSAL | 0 refills | Status: DC

## 2021-04-23 ENCOUNTER — Ambulatory Visit
Admission: RE | Admit: 2021-04-23 | Discharge: 2021-04-23 | Disposition: A | Discharge status: Home / Self Care | Payer: Medicare Other | Source: Ambulatory Visit | Attending: Radiation Oncology | Admitting: Radiation Oncology

## 2021-04-23 ENCOUNTER — Other Ambulatory Visit: Payer: Self-pay

## 2021-04-23 ENCOUNTER — Encounter: Payer: Self-pay | Admitting: Urology

## 2021-04-23 DIAGNOSIS — Z510 Encounter for antineoplastic radiation therapy: Secondary | ICD-10-CM | POA: Diagnosis not present

## 2021-04-27 NOTE — Progress Notes (Signed)
[facility] [address], [place] [address]   CLINIC:  Medical Oncology/Hematology  PCP:  Tammy Presto, MD [address] [place] [address] [phone]   REASON FOR VISIT:  Follow-up for right lower lobe lung mass and metastatic cancer to brain.  PRIOR THERAPY: none  NGS Results: not done  CURRENT THERAPY: under work-up  BRIEF ONCOLOGIC HISTORY:  Oncology History   No history exists.    CANCER STAGING: Cancer Staging No matching staging information was found for the patient.  INTERVAL HISTORY:  [name], a 69 y.o. female, returns for routine follow-up of her right lower lobe lung mass and metastatic cancer to brain. Tammy Blair was last seen on 04/26/2021.   Today she reports feeling well and she is accompanied by her daughter. Her daughter reports a decrease in energy, but reports good appetite. Since April 07, 2021 she reports losing 45 lbs. She complains of sore gums and hair loss. She reports no issues with bowel movements, and her daughter reports she capable of approx.15 steps walking unassisted. She reports occasional headaches and one episode of double vision.    REVIEW OF SYSTEMS:  Review of Systems  Constitutional: Positive for fatigue (70%) and unexpected weight change (45 lbs loss since 04/07/2021). Negative for appetite change.  Eyes: Positive for eye problems (occasional double vision).  Neurological: Positive for dizziness, headaches (occasional) and numbness (feet).  All other systems reviewed and are negative.   PAST MEDICAL/SURGICAL HISTORY:  Past Medical History:  Diagnosis Date  . Anxiety   . Asthma   . Chronic back pain   . COPD (chronic obstructive pulmonary disease) (HCC)    on home O2  . Diabetes mellitus without complication (HCC)   . HOH (hard of hearing)   . Hypertension   . Lumbar radiculopathy    Past Surgical History:  Procedure Laterality Date  . CESAREAN SECTION    . FINE NEEDLE  ASPIRATION  04/13/2021   Procedure: FINE NEEDLE ASPIRATION (FNA) LINEAR;  Surgeon: Josephine Igo, DO;  Location: [facility];  Service: Pulmonary;;  . TONSILLECTOMY    . VIDEO BRONCHOSCOPY WITH ENDOBRONCHIAL ULTRASOUND Bilateral 04/13/2021   Procedure: VIDEO BRONCHOSCOPY WITH ENDOBRONCHIAL ULTRASOUND;  Surgeon: Josephine Igo, DO;  Location: [facility];  Service: Pulmonary;  Laterality: Bilateral;    SOCIAL HISTORY:  Social History   Socioeconomic History  . Marital status: Divorced    Spouse name: Not on file  . Number of children: Not on file  . Years of education: Not on file  . Highest education level: Not on file  Occupational History  . Not on file  Tobacco Use  . Smoking status: Current Every Day Smoker    Packs/day: 0.50    Years: 40.00    Pack years: 20.00    Types: Cigarettes  . Smokeless tobacco: Never Used  Vaping Use  . Vaping Use: Never used  Substance and Sexual Activity  . Alcohol use: No  . Drug use: Not Currently  . Sexual activity: Not Currently  Other Topics Concern  . Not on file  Social History Narrative  . Not on file   Social Determinants of Health   Financial Resource Strain: Not on file  Food Insecurity: Not on file  Transportation Needs: Not on file  Physical Activity: Not on file  Stress: Not on file  Social Connections: Not on file  Intimate Partner Violence: Not on file    FAMILY  HISTORY:  Family History  Problem Relation Age of Onset  . COPD Mother   . Cancer Mother        kidney  . Stroke Brother   . Asthma Sister     CURRENT MEDICATIONS:  Current Outpatient Medications  Medication Sig Dispense Refill  . acetaminophen (TYLENOL) 500 MG tablet Take 1 tablet (500 mg total) by mouth every 8 (eight) hours. 60 tablet   . albuterol (PROVENTIL) (2.5 MG/3ML) 0.083% nebulizer solution Take 3 mLs (2.5 mg total) by nebulization every 4 (four) hours as needed for wheezing or shortness of breath. 75 mL 1  . albuterol (VENTOLIN HFA)  108 (90 Base) MCG/ACT inhaler Inhale 1-2 puffs into the lungs every 4 (four) hours as needed for wheezing or shortness of breath. 1 each 0  . ALPRAZolam (XANAX) 0.5 MG tablet TAKE 1 TABLET BY MOUTH THREE TIMES DAILY AS NEEDED 20 tablet 0  . amoxicillin-clavulanate (AUGMENTIN) 875-125 MG tablet Take 1 tablet by mouth every 12 (twelve) hours. 6 tablet 0  . aspirin EC 81 MG EC tablet Take 1 tablet (81 mg total) by mouth daily. Swallow whole. 30 tablet 11  . atorvastatin (LIPITOR) 40 MG tablet Take 1 tablet (40 mg total) by mouth daily. 90 tablet 1  . blood glucose meter kit and supplies Dispense based on patient and insurance preference. Use up to 3 times daily as directed. (FOR ICD-10 E10.9, E11.9). 1 each 0  . budesonide-formoterol (SYMBICORT) 160-4.5 MCG/ACT inhaler Inhale 1 puff into the lungs 2 (two) times daily. 1 each 2  . chlorthalidone (HYGROTON) 25 MG tablet Take 25 mg by mouth daily. With food    . CIPRODEX OTIC suspension SMARTSIG:In Ear(s)    . cyclobenzaprine (FLEXERIL) 10 MG tablet Take 0.5 tablets (5 mg total) by mouth 3 (three) times daily as needed for muscle spasms. For muscle spasm 30 tablet 0  . dexamethasone (DECADRON) 4 MG tablet Take 1 tablet (4 mg total) by mouth 3 (three) times daily. 90 tablet 0  . diltiazem (CARDIZEM CD) 240 MG 24 hr capsule Take 1 capsule (240 mg total) by mouth daily. 90 capsule 1  . gabapentin (NEURONTIN) 300 MG capsule Take 1 capsule (300 mg total) by mouth 3 (three) times daily. 90 capsule 1  . insulin isophane & regular human (NOVOLIN 70/30 FLEXPEN RELION) (70-30) 100 UNIT/ML KwikPen Inject 30 units subcutaneously twice a day before breakfast and dinner 30 mL 1  . Insulin Pen Needle (PEN NEEDLES) 31G X 6 MM MISC 1 pen by Does not apply route in the morning and at bedtime. 100 each 0  . malathion (OVIDE) 0.5 % lotion Apply topically.    . metFORMIN (GLUCOPHAGE-XR) 500 MG 24 hr tablet Take 1 tablet (500 mg total) by mouth in the morning and at bedtime.  180 tablet 1  . metoprolol tartrate (LOPRESSOR) 100 MG tablet Take 100 mg by mouth 2 (two) times daily.    . nicotine (NICODERM CQ - DOSED IN MG/24 HOURS) 21 mg/24hr patch Place 1 patch (21 mg total) onto the skin daily. 28 patch 1  . nystatin (MYCOSTATIN) 100000 UNIT/ML suspension Take 5 mLs (500,000 Units total) by mouth 4 (four) times daily. 60 mL 0  . ondansetron (ZOFRAN) 4 MG tablet Take 1 tablet (4 mg total) by mouth daily as needed for nausea or vomiting. 30 tablet 1  . oxyCODONE (OXY IR/ROXICODONE) 5 MG immediate release tablet TAKE (1) TABLET BY MOUTH EVERY 6 HOURS AS NEEDED FOR 14 DAYS. [place]  tablet 0  . umeclidinium bromide (INCRUSE ELLIPTA) 62.5 MCG/INH AEPB Inhale 1 puff into the lungs daily. 1 each 1   No current facility-administered medications for this visit.    ALLERGIES:  No Known Allergies  PHYSICAL EXAM:  Performance status (ECOG): 2 - Symptomatic, <50% confined to bed  Vitals:   04/28/21 1313  BP: 138/72  Pulse: 85  Resp: 20  Temp: (!) 97.3 F (36.3 C)  SpO2: 90%   Wt Readings from Last 3 Encounters:  04/28/21 156 lb 1.6 oz (70.8 kg)  04/07/21 201 lb 15.1 oz (91.6 kg)  09/18/18 201 lb 15.1 oz (91.6 kg)   Physical Exam Vitals reviewed.  Constitutional:      Appearance: Normal appearance.  Cardiovascular:     Rate and Rhythm: Normal rate and regular rhythm.     Pulses: Normal pulses.     Heart sounds: Normal heart sounds.  Pulmonary:     Effort: Pulmonary effort is normal.     Breath sounds: Normal breath sounds.  Neurological:     General: No focal deficit present.     Mental Status: She is alert and oriented to person, place, and time.     Motor: Weakness present.     Deep Tendon Reflexes:     Reflex Scores:      Brachioradialis reflexes are 3+ on the right side and 4+ on the left side. Psychiatric:        Mood and Affect: Mood normal.        Behavior: Behavior normal.      LABORATORY DATA:  I have reviewed the labs as listed.  CBC Latest  Ref Rng & Units 04/14/2021 04/13/2021 04/12/2021  WBC 4.0 - 10.5 K/uL 22.3(H) 18.3(H) 18.5(H)  Hemoglobin 12.0 - 15.0 g/dL 13.8 14.8 14.7  Hematocrit 36.0 - 46.0 % 40.3 44.4 44.1  Platelets 150 - 400 K/uL 225 244 292   CMP Latest Ref Rng & Units 04/14/2021 04/13/2021 04/12/2021  Glucose 70 - 99 mg/dL 189(H) 169(H) 223(H)  BUN 8 - 23 mg/dL 24(H) 29(H) 32(H)  Creatinine 0.44 - 1.00 mg/dL 0.54 0.66 0.65  Sodium 135 - 145 mmol/L 134(L) 134(L) 139  Potassium 3.5 - 5.1 mmol/L 4.2 4.1 3.5  Chloride 98 - 111 mmol/L 95(L) 93(L) 94(L)  CO2 22 - 32 mmol/L 29 31 33(H)  Calcium 8.9 - 10.3 mg/dL 9.1 9.1 9.3  Total Protein 6.5 - 8.1 g/dL - - -  Total Bilirubin 0.3 - 1.2 mg/dL - - -  Alkaline Phos 38 - 126 U/L - - -  AST 15 - 41 U/L - - -  ALT 0 - 44 U/L - - -    DIAGNOSTIC IMAGING:  I have independently reviewed the scans and discussed with the patient. CT Head Wo Contrast  Result Date: 04/07/2021 CLINICAL DATA:  Right arm weakness for the past week. EXAM: CT HEAD WITHOUT CONTRAST TECHNIQUE: Contiguous axial images were obtained from the base of the skull through the vertex without intravenous contrast. COMPARISON:  Head CT 10/07/2017 FINDINGS: Brain: Moderately large area of low-density in the subcortical white matter of the left frontal lobe suspicious for subacute infarct. Small area of subcortical low-density involving the posterior right frontal lobe may also represent subacute ischemia. No hemorrhage. Findings are new from 2018 exam. No hydrocephalus or midline shift. No subdural or extra-axial collection. Vascular: There is high-density within both the left and right MCAs, however this is more prominent on the left. Skull: No fracture or focal lesion.  Sinuses/Orbits: Left maxillary sinus mucosal thickening with bubbly debris and fluid level. Prior left mastoidectomy. Opacification of right mastoid air cells. Other: None. IMPRESSION: 1. Moderately large area of low-density in the subcortical white matter of the  left frontal lobe suspicious for subacute infarct. Small area of subcortical low-density involving the posterior right frontal lobe may also represent subacute ischemia. Findings are new from 2018 exam. Consider MRI for confirmation. 2. High-density within both the left and right MCAs, however this is more prominent on the left. CTA or MRA recommended. 3. Left maxillary sinus mucosal thickening with bubbly debris and fluid level, can be seen with acute sinusitis. Electronically Signed   By: Keith Rake M.D.   On: 04/07/2021 21:30   MR ANGIO HEAD WO CONTRAST  Result Date: 04/08/2021 CLINICAL DATA:  Acute neuro deficit. Right arm weakness. Lung mass on chest x-ray EXAM: MRI HEAD WITHOUT CONTRAST MRA HEAD WITHOUT CONTRAST TECHNIQUE: Multiplanar, multiecho pulse sequences of the brain and surrounding structures were obtained without intravenous contrast. Angiographic images of the head were obtained using MRA technique without contrast. COMPARISON:  CT head 04/07/2021 FINDINGS: MRI HEAD FINDINGS Brain: Negative for acute infarct. Moderately large area of vasogenic edema in the left frontal parietal lobe as noted on CT. There is an associated 15 mm mass in the left precentral gyrus. Second area vasogenic edema is smaller in the right parietal lobe also with a associated small mass lesion. Mass is best seen on FLAIR and diffusion weighted imaging. Additional small areas of edema in the right frontal lobe and right pericallosal white matter also suspicious for tumor. Separate areas of vasogenic edema in the right parietal lobe and right occipital lobe with associated small mass lesions. Small areas of FLAIR hyperintensity in the cerebellum bilaterally suspicious for metastatic disease. No acute hemorrhage.  Ventricle size normal.  No midline shift. Vascular: Normal arterial flow voids. Skull and upper cervical spine: No suspicious skeletal lesions. Sinuses/Orbits: Extensive mucosal edema and air-fluid level left  maxillary sinus. Negative orbit. Bilateral mastoid effusion Other: None MRA HEAD FINDINGS Right vertebral artery dominant and widely patent. Left vertebral artery is hypoplastic distally but does contribute to the basilar. PICA patent bilaterally. Atherosclerotic irregularity in the basilar with moderate stenosis in the distal basilar. Posterior cerebral arteries patent bilaterally without significant stenosis Atherosclerotic irregularity and stenosis in the cavernous carotid bilaterally. Anterior and middle cerebral arteries patent bilaterally without stenosis or large vessel occlusion. Negative for cerebral aneurysm. IMPRESSION: 1. Negative for acute infarct 2. Multiple intracranial mass lesions with surrounding vasogenic edema compatible with metastatic disease. Lung mass noted on chest x-ray 04/07/2021. Recommend follow-up MRI brain with contrast to evaluate metastatic disease 3. Intracranial atherosclerotic disease especially the basilar and posterior cerebral arteries bilaterally. Negative for large vessel occlusion. Electronically Signed   By: Franchot Gallo M.D.   On: 04/08/2021 13:15   MR BRAIN WO CONTRAST  Result Date: 04/08/2021 CLINICAL DATA:  Acute neuro deficit. Right arm weakness. Lung mass on chest x-ray EXAM: MRI HEAD WITHOUT CONTRAST MRA HEAD WITHOUT CONTRAST TECHNIQUE: Multiplanar, multiecho pulse sequences of the brain and surrounding structures were obtained without intravenous contrast. Angiographic images of the head were obtained using MRA technique without contrast. COMPARISON:  CT head 04/07/2021 FINDINGS: MRI HEAD FINDINGS Brain: Negative for acute infarct. Moderately large area of vasogenic edema in the left frontal parietal lobe as noted on CT. There is an associated 15 mm mass in the left precentral gyrus. Second area vasogenic edema is smaller in the right  parietal lobe also with a associated small mass lesion. Mass is best seen on FLAIR and diffusion weighted imaging. Additional  small areas of edema in the right frontal lobe and right pericallosal white matter also suspicious for tumor. Separate areas of vasogenic edema in the right parietal lobe and right occipital lobe with associated small mass lesions. Small areas of FLAIR hyperintensity in the cerebellum bilaterally suspicious for metastatic disease. No acute hemorrhage.  Ventricle size normal.  No midline shift. Vascular: Normal arterial flow voids. Skull and upper cervical spine: No suspicious skeletal lesions. Sinuses/Orbits: Extensive mucosal edema and air-fluid level left maxillary sinus. Negative orbit. Bilateral mastoid effusion Other: None MRA HEAD FINDINGS Right vertebral artery dominant and widely patent. Left vertebral artery is hypoplastic distally but does contribute to the basilar. PICA patent bilaterally. Atherosclerotic irregularity in the basilar with moderate stenosis in the distal basilar. Posterior cerebral arteries patent bilaterally without significant stenosis Atherosclerotic irregularity and stenosis in the cavernous carotid bilaterally. Anterior and middle cerebral arteries patent bilaterally without stenosis or large vessel occlusion. Negative for cerebral aneurysm. IMPRESSION: 1. Negative for acute infarct 2. Multiple intracranial mass lesions with surrounding vasogenic edema compatible with metastatic disease. Lung mass noted on chest x-ray 04/07/2021. Recommend follow-up MRI brain with contrast to evaluate metastatic disease 3. Intracranial atherosclerotic disease especially the basilar and posterior cerebral arteries bilaterally. Negative for large vessel occlusion. Electronically Signed   By: Franchot Gallo M.D.   On: 04/08/2021 13:15   MR BRAIN W CONTRAST  Result Date: 04/08/2021 CLINICAL DATA:  Multiple masses on noncontrast MRI EXAM: MRI HEAD WITH CONTRAST TECHNIQUE: Multiplanar, multiecho pulse sequences of the brain and surrounding structures were obtained with intravenous contrast. CONTRAST:   62mL GADAVIST GADOBUTROL 1 MMOL/ML IV SOLN COMPARISON:  Earlier same day FINDINGS: Approximately 16 enhancing lesions are identified. The largest in the posterior left frontal lobe measures 1.5 cm involving the precentral gyrus. Small cerebellar lesions are present. Questionable No new finding. 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 Electronically Signed   By: Macy Mis M.D.   On: 04/08/2021 18:21   CT CHEST ABDOMEN PELVIS W CONTRAST  Result Date: 04/08/2021 CLINICAL DATA:  Right upper lobe lung mass on chest radiography, with intracranial metastatic disease. Staging workup. EXAM: CT CHEST, ABDOMEN, AND PELVIS WITH CONTRAST TECHNIQUE: Multidetector CT imaging of the chest, abdomen and pelvis was performed following the standard protocol during bolus administration of intravenous contrast. CONTRAST:  184mL OMNIPAQUE IOHEXOL 300 MG/ML  SOLN COMPARISON:  Chest radiograph 04/07/2021 and CT chest from 10/08/2017 FINDINGS: CT CHEST FINDINGS Cardiovascular: Coronary, aortic arch, and branch vessel atherosclerotic vascular disease. Moderate pericardial effusion. Mediastinum/Nodes: There is bilateral supraclavicular, bilateral hilar, bilateral subpectoral, prevascular, paratracheal, AP window, right hilar, and subcarinal pathologic adenopathy. Index right paratracheal node 2.6 cm in short axis, image 25 series 2. Index right hilar node 2.5 cm in short axis, image 29 series 2. There is also lower neck adenopathy in the level IV stations bilaterally, including a 1.0 cm right level IV lymph node on image 7 series 2. Lungs/Pleura: 3.7 by 3.7 cm right upper lobe mass, image 59 series 4. Part of this abuts the major fissure. Mild atelectasis medially in the right middle lobe. Airway thickening with airway plugging in the right lower lobe. 0.7 by 0.9 cm left upper lobe nodule medially on image 65 of series 4. Bandlike density in the lingula. Bandlike density with some nodularity measuring up to 1.0 cm in  thickness in the left lower lobe (image 91, series  4). Mild airway thickening in the left lower lobe. 0.5 cm nodule in the superior segment left lower lobe, image 64 series 4. Musculoskeletal: Unremarkable CT ABDOMEN PELVIS FINDINGS Hepatobiliary: Nodular liver contour suspicious for cirrhosis. No metastatic lesion is identified in the liver. Upper normal size of the gallbladder without overt gallbladder wall thickening. No biliary dilatation. Pancreas: Unremarkable Spleen: Unremarkable Adrenals/Urinary Tract: Both adrenal glands appear normal. The kidneys and urinary bladder appear normal. Stomach/Bowel: Unremarkable Vascular/Lymphatic: Aortoiliac atherosclerotic vascular disease. Mildly enlarged 1.1 cm in short axis porta hepatis lymph node on image 57 series 2, possibly reactive given the underlying cirrhosis. Portacaval node 1.2 cm in short axis, image 60 series 2. Reproductive: Unremarkable Other: Small collections of fluid and gas along the anterior abdominal wall are probably injection related. Musculoskeletal: Lumbar spondylosis and degenerative disc disease causing bilateral foraminal impingement at L4-5 and L5-S1 and left foraminal impingement at L3-4. IMPRESSION: 1. 3.7 cm right upper lobe lung mass, with extensive thoracic and some lower neck adenopathy. In the context of the patient's intracranial metastatic deposits, this likely represents metastatic right upper lobe bronchogenic carcinoma. 2. 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 3. Moderate pericardial effusion. 4. Airway thickening in the lower lobes with some mild airway plugging in the right lower lobe. 5. Two small left-sided pulmonary nodules are indeterminate but merit surveillance. 6. Other imaging findings of potential clinical significance: Aortic Atherosclerosis (ICD10-I70.0). Coronary atherosclerosis.  Multilevel lumbar impingement Electronically Signed   By: Van Clines M.D.   On: 04/08/2021 18:46   DG CHEST PORT 1 VIEW  Result Date: 04/07/2021 CLINICAL DATA:  Cough EXAM: PORTABLE CHEST 1 VIEW COMPARISON:  09/27/2018 FINDINGS: The lungs are symmetrically well expanded. A 3.6 cm mass is developed within the right upper lobe. No pneumothorax or pleural effusion. Cardiac size within normal limits. Pulmonary vascularity is normal. No acute bone abnormality. IMPRESSION: Interval development of a 3.6 cm mass within the right upper lobe. Dedicated CT imaging is recommended for further evaluation. Electronically Signed   By: Fidela Salisbury MD   On: 04/07/2021 23:26   ECHOCARDIOGRAM COMPLETE  Result Date: 04/09/2021    ECHOCARDIOGRAM REPORT   Patient Name:   ARLIE POSCH [facility] Date of Exam: 04/09/2021 Medical Rec #:  [id number]   Height:       69.0 in Accession #:    [id number]  Weight:       201.9 lb Date of Birth:  05/14/1951    BSA:          2.075 m Patient Age:    74 years    BP:           96/84 mmHg Patient Gender: F           HR:           109 bpm. Exam Location:  [facility] Procedure: 2D Echo, Cardiac Doppler and Color Doppler Indications:    CVA  History:        Patient has prior history of Echocardiogram examinations, most                 recent 10/07/2017. Stroke and COPD; Risk Factors:Current Smoker,                 Hypertension, Diabetes and Dyslipidemia. Lung mass with Brain  mets.  Sonographer:    Lavenia Atlas RDCS Referring Phys: [id number] ASIA B ZIERLE-GHOSH IMPRESSIONS  1. Left ventricular ejection fraction, by estimation, is 65 to 70%. The left ventricle has normal function. The left ventricle has no regional wall motion abnormalities. There is mild left ventricular hypertrophy. Left ventricular diastolic parameters are indeterminate.  2. Right ventricular systolic function is normal. The right ventricular size is normal. There is normal pulmonary artery systolic pressure. The  estimated right ventricular systolic pressure is 19.7 mmHg.  3. Small to moderate pericardial effusion. The pericardial effusion is posterior to the left ventricle, lateral to the left ventricle and anterior to the right ventricle. There is no evidence of cardiac tamponade. Fibrinous material noted suggesting relative chronicity.  4. The mitral valve is grossly normal. Trivial mitral valve regurgitation.  5. The aortic valve is tricuspid. Aortic valve regurgitation is not visualized.  6. The inferior vena cava is dilated in size with >50% respiratory variability, suggesting right atrial pressure of 8 mmHg. FINDINGS  Left Ventricle: Left ventricular ejection fraction, by estimation, is 65 to 70%. The left ventricle has normal function. The left ventricle has no regional wall motion abnormalities. The left ventricular internal cavity size was normal in size. There is  mild left ventricular hypertrophy. Left ventricular diastolic parameters are indeterminate. Right Ventricle: The right ventricular size is normal. No increase in right ventricular wall thickness. Right ventricular systolic function is normal. There is normal pulmonary artery systolic pressure. The tricuspid regurgitant velocity is 1.71 m/s, and  with an assumed right atrial pressure of 8 mmHg, the estimated right ventricular systolic pressure is 19.7 mmHg. Left Atrium: Left atrial size was normal in size. Right Atrium: Right atrial size was normal in size. Pericardium: A moderately sized pericardial effusion is present. The pericardial effusion is posterior to the left ventricle, lateral to the left ventricle and anterior to the right ventricle. There is no evidence of cardiac tamponade. Mitral Valve: The mitral valve is grossly normal. Trivial mitral valve regurgitation. Tricuspid Valve: The tricuspid valve is grossly normal. Tricuspid valve regurgitation is trivial. Aortic Valve: The aortic valve is tricuspid. There is mild aortic valve annular  calcification. Aortic valve regurgitation is not visualized. Pulmonic Valve: The pulmonic valve was grossly normal. Pulmonic valve regurgitation is trivial. Aorta: The aortic root is normal in size and structure. Venous: The inferior vena cava is dilated in size with greater than 50% respiratory variability, suggesting right atrial pressure of 8 mmHg. IAS/Shunts: The interatrial septum appears to be lipomatous. No atrial level shunt detected by color flow Doppler.  LEFT VENTRICLE PLAX 2D LVIDd:         4.16 cm  Diastology LVIDs:         2.41 cm  LV e' medial:    6.20 cm/s LV PW:         1.16 cm  LV E/e' medial:  11.6 LV IVS:        1.21 cm  LV e' lateral:   4.46 cm/s LVOT diam:     2.00 cm  LV E/e' lateral: 16.1 LV SV:         67 LV SV Index:   32 LVOT Area:     3.14 cm  RIGHT VENTRICLE RV Basal diam:  2.95 cm RV S prime:     11.30 cm/s TAPSE (M-mode): 2.7 cm LEFT ATRIUM             Index       RIGHT ATRIUM  Index LA diam:        3.30 cm 1.59 cm/m  RA Area:     12.70 cm LA Vol (A2C):   39.2 ml 18.90 ml/m RA Volume:   29.20 ml  14.08 ml/m LA Vol (A4C):   28.8 ml 13.88 ml/m LA Biplane Vol: 34.3 ml 16.53 ml/m  AORTIC VALVE LVOT Vmax:   118.00 cm/s LVOT Vmean:  78.500 cm/s LVOT VTI:    0.212 m  AORTA Ao Root diam: 2.50 cm MITRAL VALVE               TRICUSPID VALVE MV Area (PHT): 3.91 cm    TR Peak grad:   11.7 mmHg MV Decel Time: 194 msec    TR Vmax:        171.00 cm/s MV E velocity: 72.00 cm/s MV A velocity: 88.30 cm/s  SHUNTS MV E/A ratio:  0.82        Systemic VTI:  0.21 m                            Systemic Diam: 2.00 cm Rozann Lesches MD Electronically signed by Rozann Lesches MD Signature Date/Time: 04/09/2021/4:09:53 PM    Final      ASSESSMENT:  1.  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  MRI of the brain without contrast on 03/31/2021 showed multiple  intracranial mass lesions with surrounding vasogenic edema. - Current active smoker, 1 pack/day for 50 years. - CT CAP showed right lower lobe lung mass with extensive mediastinal and subcarinal adenopathy. - Bronchoscopy and biopsy of the lymph node 4R on 04/13/2021 consistent with adenocarcinoma.  Cells are positive for TTF-1, negative for p40 and cytokeratin 5/6. -She completed brain radiation.  2.  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 - Father had bone cancer.  Sister died of leukemia.  Paternal cousin died of metastatic lung cancer.  3.  Brain metastasis: - She received WBRT 30 Gray in 10 fractions (started around 04/12/2021) by Dr. [name].   PLAN:  1.  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  We have also discussed prognosis of metastatic lung cancer with treatment intent in the palliative setting. - She has ECOG 2 performance status.  According to the daughter and the patient, performance status has improved since she is doing physical therapy. - I have recommended PD-L1 and molecular testing on her biopsy. - We will see her back in 2 weeks for follow-up to discuss results of NGS testing.  We will see if she is eligible candidate for immunotherapy or other targeted therapies.  2.  Brain metastasis: -She has completed whole brain radiation therapy.  Currently on dexamethasone 4 mg 3 times daily. - Would recommend decrease dexamethasone to 4 mg twice daily.   Orders placed this encounter:  No orders of the defined types were placed in this encounter.  Total time spent is 40 minutes with more than 50% of the time spent face-to-face discussing new diagnosis, treatment plan, review of records, counseling and coordination of care.  Derek Jack, MD [place] [phone]   I, Thana Ates, am acting as a  scribe for Dr. Derek Jack.  Kinnie Scales MD, have reviewed  the above documentation for accuracy and completeness, and I agree with the above.

## 2021-04-28 ENCOUNTER — Inpatient Hospital Stay (HOSPITAL_COMMUNITY): Payer: Medicare Other | Attending: Hematology | Admitting: Hematology

## 2021-04-28 ENCOUNTER — Other Ambulatory Visit: Payer: Self-pay

## 2021-04-28 VITALS — BP 138/72 | HR 85 | Temp 97.3°F | Resp 20 | Wt 156.1 lb

## 2021-04-28 DIAGNOSIS — E785 Hyperlipidemia, unspecified: Secondary | ICD-10-CM | POA: Diagnosis not present

## 2021-04-28 DIAGNOSIS — Z79899 Other long term (current) drug therapy: Secondary | ICD-10-CM | POA: Diagnosis not present

## 2021-04-28 DIAGNOSIS — Z794 Long term (current) use of insulin: Secondary | ICD-10-CM | POA: Diagnosis not present

## 2021-04-28 DIAGNOSIS — Z7982 Long term (current) use of aspirin: Secondary | ICD-10-CM | POA: Insufficient documentation

## 2021-04-28 DIAGNOSIS — Z923 Personal history of irradiation: Secondary | ICD-10-CM | POA: Insufficient documentation

## 2021-04-28 DIAGNOSIS — I10 Essential (primary) hypertension: Secondary | ICD-10-CM | POA: Diagnosis not present

## 2021-04-28 DIAGNOSIS — Z7951 Long term (current) use of inhaled steroids: Secondary | ICD-10-CM | POA: Insufficient documentation

## 2021-04-28 DIAGNOSIS — J449 Chronic obstructive pulmonary disease, unspecified: Secondary | ICD-10-CM | POA: Insufficient documentation

## 2021-04-28 DIAGNOSIS — F17210 Nicotine dependence, cigarettes, uncomplicated: Secondary | ICD-10-CM | POA: Insufficient documentation

## 2021-04-28 DIAGNOSIS — Z7952 Long term (current) use of systemic steroids: Secondary | ICD-10-CM | POA: Diagnosis not present

## 2021-04-28 DIAGNOSIS — C3491 Malignant neoplasm of unspecified part of right bronchus or lung: Secondary | ICD-10-CM | POA: Insufficient documentation

## 2021-04-28 DIAGNOSIS — E119 Type 2 diabetes mellitus without complications: Secondary | ICD-10-CM | POA: Insufficient documentation

## 2021-04-28 DIAGNOSIS — C3431 Malignant neoplasm of lower lobe, right bronchus or lung: Secondary | ICD-10-CM | POA: Insufficient documentation

## 2021-04-28 DIAGNOSIS — C7931 Secondary malignant neoplasm of brain: Secondary | ICD-10-CM

## 2021-04-28 DIAGNOSIS — R918 Other nonspecific abnormal finding of lung field: Secondary | ICD-10-CM | POA: Diagnosis not present

## 2021-04-28 DIAGNOSIS — Z8673 Personal history of transient ischemic attack (TIA), and cerebral infarction without residual deficits: Secondary | ICD-10-CM | POA: Insufficient documentation

## 2021-04-28 NOTE — Progress Notes (Signed)
Caris testing ordered per Dr. Delton Coombes

## 2021-04-28 NOTE — Patient Instructions (Addendum)
[place] at [facility] Discharge Instructions  You were seen and examined today by Dr. Delton Coombes. Dr. Delton Coombes is a medical oncologist, meaning he specializes in the management of cancer diagnoses with medications. Dr. Delton Coombes discussed your past medical history, family history of cancer and the events that led to you being here today.  Dr. Delton Coombes reviewed your recent scan and biopsy results. You have been diagnosed with adenocarcinoma which is a type of lung cancer. You have had radiation to treat the lung cancer that spread to your brain. Dr. Delton Coombes has recommended additional testing on the tissue that was retrieved during your biopsy to see if you are a candidate for immunotherapy. Immunotherapy is not chemotherapy, it tells your immune system to recognize and fight the cancer. It is typically very well tolerated but Dr. Delton Coombes would like for you to be stronger prior to starting treatment.   In the meantime, taper your Decadron to one in the morning and one in the evening until you follow-up with Dr. Delton Coombes.  Dr. Delton Coombes will see you in 2-3 weeks to discuss treatment options.   Thank you for choosing [place] at [facility] to provide your oncology and hematology care.  To afford each patient quality time with our provider, please arrive at least 15 minutes before your scheduled appointment time.   If you have a lab appointment with the [facility] please come in thru the Main Entrance and check in at the main information desk.  You need to re-schedule your appointment should you arrive 10 or more minutes late.  We strive to give you quality time with our providers, and arriving late affects you and other patients whose appointments are after yours.  Also, if you no show three or more times for appointments you may be dismissed from the clinic at the providers discretion.     Again, thank you for choosing [facility].  Our hope is that these requests will decrease the amount of time that you wait before being seen by our physicians.       _____________________________________________________________  Should you have questions after your visit to [facility], please contact our office at [phone] and follow the prompts.  Our office hours are 8:00 a.m. and 4:30 p.m. Monday - Friday.  Please note that voicemails left after 4:00 p.m. may not be returned until the following business day.  We are closed weekends and major holidays.  You do have access to a nurse 24-7, just call the main number to the clinic [phone] and do not press any options, hold on the line and a nurse will answer the phone.    For prescription refill requests, have your pharmacy contact our office and allow 72 hours.    Due to Covid, you will need to wear a mask upon entering the hospital. If you do not have a mask, a mask will be given to you at the Main Entrance upon arrival. For doctor visits, patients may have 1 support person age 68 or older with them. For treatment visits, patients can not have anyone with them due to social distancing guidelines and our immunocompromised population.

## 2021-04-29 ENCOUNTER — Encounter: Payer: Self-pay | Admitting: Radiation Oncology

## 2021-04-29 ENCOUNTER — Ambulatory Visit: Payer: Medicare Other | Admitting: Emergency Medicine

## 2021-04-29 NOTE — Progress Notes (Signed)
On 04/22/21 during her PUT encounter the patient requested this RN remove her daughter as a point of contact. Patient requested her sister be appointed as her emergency contact. This RN did as the patient requested.

## 2021-04-30 ENCOUNTER — Other Ambulatory Visit: Payer: Self-pay

## 2021-04-30 ENCOUNTER — Telehealth: Payer: Self-pay

## 2021-04-30 ENCOUNTER — Ambulatory Visit (INDEPENDENT_AMBULATORY_CARE_PROVIDER_SITE_OTHER): Payer: Medicare Other | Admitting: Pulmonary Disease

## 2021-04-30 ENCOUNTER — Encounter: Payer: Self-pay | Admitting: Pulmonary Disease

## 2021-04-30 VITALS — BP 140/68 | HR 75 | Temp 97.3°F | Ht 69.0 in | Wt 159.0 lb

## 2021-04-30 DIAGNOSIS — C3491 Malignant neoplasm of unspecified part of right bronchus or lung: Secondary | ICD-10-CM | POA: Diagnosis not present

## 2021-04-30 DIAGNOSIS — J449 Chronic obstructive pulmonary disease, unspecified: Secondary | ICD-10-CM

## 2021-04-30 DIAGNOSIS — I639 Cerebral infarction, unspecified: Secondary | ICD-10-CM

## 2021-04-30 DIAGNOSIS — C7931 Secondary malignant neoplasm of brain: Secondary | ICD-10-CM | POA: Diagnosis not present

## 2021-04-30 MED ORDER — BREZTRI AEROSPHERE 160-9-4.8 MCG/ACT IN AERO: 2.0000 | INHALATION_SPRAY | Freq: Two times a day (BID) | RESPIRATORY_TRACT | 0 refills | Status: AC

## 2021-04-30 NOTE — Telephone Encounter (Signed)
Attempted to contact patient to schedule a Palliative Care consult appointment. No answer left a message to return call.  

## 2021-04-30 NOTE — Progress Notes (Signed)
Synopsis: Referred in May 2022 for abnormal CT chest, adenocarcinoma of the right lung, hospital follow-up, PCP: By [name], MD  Subjective:   PATIENT ID: Tammy Blair: female DOB: 30-Jun-1951, MRN: [id number]  Chief Complaint  Patient presents with  . Follow-up    Patient has been having swelling in her ankles and her feet. Shortness of breath with exertion. Wears oxygen as needed during the day and every night.     This is a 70 year old female, past medical history of COPD on home oxygen therapy, hypertension, diabetes.  Patient initially presented to [facility] and was ultimately transferred to [facility] with 3-week complaints of right arm weakness and falling at home.  MRI of the brain revealed multiple intracranial lesions with vasogenic edema concerning for metastatic disease.  CT scan of the chest revealed a 3.7 right upper lobe lung mass.  Patient was taken for bronchoscopy on 04/13/2021, 4R lymph node in the peritracheal region was positive for adenocarcinoma.  Patient was discharged from the hospital with follow-up from medical oncology and radiation oncology.  Patient has establish care with Dr. [name] and have completed whole brain radiation.  Additionally the patient has establish care with Dr. Delton Coombes from oncology.  New diagnosis of stage IV right lung adenocarcinoma with brain metastasis.  OV 04/30/2021: Here today for hospital follow-up after recent diagnosis of malignancy.  She presents today with family, sister and sister's daughter.  Patient is overall very confused.  Family states that she stays confused most of the time.   Past Medical History:  Diagnosis Date  . Anxiety   . Asthma   . Chronic back pain   . COPD (chronic obstructive pulmonary disease) ([place])    on home O2  . Diabetes mellitus without complication ([place])   . HOH (hard of hearing)   . Hypertension   . Lumbar radiculopathy      Family History  Problem Relation Age of Onset   . COPD Mother   . Cancer Mother        kidney  . Stroke Brother   . Asthma Sister      Past Surgical History:  Procedure Laterality Date  . CESAREAN SECTION    . FINE NEEDLE ASPIRATION  04/13/2021   Procedure: FINE NEEDLE ASPIRATION (FNA) LINEAR;  Surgeon: Garner Nash, DO;  Location: [facility];  Service: Pulmonary;;  . TONSILLECTOMY    . VIDEO BRONCHOSCOPY WITH ENDOBRONCHIAL ULTRASOUND Bilateral 04/13/2021   Procedure: VIDEO BRONCHOSCOPY WITH ENDOBRONCHIAL ULTRASOUND;  Surgeon: Garner Nash, DO;  Location: [facility];  Service: Pulmonary;  Laterality: Bilateral;    Social History   Socioeconomic History  . Marital status: Divorced    Spouse name: Not on file  . Number of children: Not on file  . Years of education: Not on file  . Highest education level: Not on file  Occupational History  . Not on file  Tobacco Use  . Smoking status: Former Smoker    Packs/day: 0.50    Years: 40.00    Pack years: 20.00    Types: Cigarettes    Quit date: 04/11/2021    Years since quitting: 0.0  . Smokeless tobacco: Never Used  Vaping Use  . Vaping Use: Never used  Substance and Sexual Activity  . Alcohol use: No  . Drug use: Not Currently  . Sexual activity: Not Currently  Other Topics Concern  . Not on file  Social History Narrative  . Not on file  Social Determinants of Health   Financial Resource Strain: Not on file  Food Insecurity: Not on file  Transportation Needs: Not on file  Physical Activity: Not on file  Stress: Not on file  Social Connections: Not on file  Intimate Partner Violence: Not on file     No Known Allergies   Outpatient Medications Prior to Visit  Medication Sig Dispense Refill  . acetaminophen (TYLENOL) 500 MG tablet Take 1 tablet (500 mg total) by mouth every 8 (eight) hours. 60 tablet   . albuterol (PROVENTIL) (2.5 MG/3ML) 0.083% nebulizer solution Take 3 mLs (2.5 mg total) by nebulization every 4 (four) hours as needed for wheezing or  shortness of breath. 75 mL 1  . albuterol (VENTOLIN HFA) 108 (90 Base) MCG/ACT inhaler Inhale 1-2 puffs into the lungs every 4 (four) hours as needed for wheezing or shortness of breath. 1 each 0  . ALPRAZolam (XANAX) 0.5 MG tablet TAKE 1 TABLET BY MOUTH THREE TIMES DAILY AS NEEDED 20 tablet 0  . amoxicillin-clavulanate (AUGMENTIN) 875-125 MG tablet Take 1 tablet by mouth every 12 (twelve) hours. 6 tablet 0  . aspirin EC 81 MG EC tablet Take 1 tablet (81 mg total) by mouth daily. Swallow whole. 30 tablet 11  . atorvastatin (LIPITOR) 40 MG tablet Take 1 tablet (40 mg total) by mouth daily. 90 tablet 1  . blood glucose meter kit and supplies Dispense based on patient and insurance preference. Use up to 3 times daily as directed. (FOR ICD-10 E10.9, E11.9). 1 each 0  . budesonide-formoterol (SYMBICORT) 160-4.5 MCG/ACT inhaler Inhale 1 puff into the lungs 2 (two) times daily. 1 each 2  . chlorthalidone (HYGROTON) 25 MG tablet Take 25 mg by mouth daily. With food    . CIPRODEX OTIC suspension SMARTSIG:In Ear(s)    . cyclobenzaprine (FLEXERIL) 10 MG tablet Take 0.5 tablets (5 mg total) by mouth 3 (three) times daily as needed for muscle spasms. For muscle spasm 30 tablet 0  . dexamethasone (DECADRON) 4 MG tablet Take 1 tablet (4 mg total) by mouth 3 (three) times daily. 90 tablet 0  . diltiazem (CARDIZEM CD) 240 MG 24 hr capsule Take 1 capsule (240 mg total) by mouth daily. 90 capsule 1  . gabapentin (NEURONTIN) 300 MG capsule Take 1 capsule (300 mg total) by mouth 3 (three) times daily. 90 capsule 1  . insulin isophane & regular human (NOVOLIN 70/30 FLEXPEN RELION) (70-30) 100 UNIT/ML KwikPen Inject 30 units subcutaneously twice a day before breakfast and dinner 30 mL 1  . Insulin Pen Needle (PEN NEEDLES) 31G X 6 MM MISC 1 pen by Does not apply route in the morning and at bedtime. 100 each 0  . malathion (OVIDE) 0.5 % lotion Apply topically.    . metFORMIN (GLUCOPHAGE-XR) 500 MG 24 hr tablet Take 1 tablet  (500 mg total) by mouth in the morning and at bedtime. 180 tablet 1  . metoprolol tartrate (LOPRESSOR) 100 MG tablet Take 100 mg by mouth 2 (two) times daily.    . nicotine (NICODERM CQ - DOSED IN MG/24 HOURS) 21 mg/24hr patch Place 1 patch (21 mg total) onto the skin daily. 28 patch 1  . nystatin (MYCOSTATIN) 100000 UNIT/ML suspension Take 5 mLs (500,000 Units total) by mouth 4 (four) times daily. 60 mL 0  . ondansetron (ZOFRAN) 4 MG tablet Take 1 tablet (4 mg total) by mouth daily as needed for nausea or vomiting. 30 tablet 1  . oxyCODONE (OXY IR/ROXICODONE) 5 MG immediate  release tablet TAKE (1) TABLET BY MOUTH EVERY 6 HOURS AS NEEDED FOR 14 DAYS. 30 tablet 0  . umeclidinium bromide (INCRUSE ELLIPTA) 62.5 MCG/INH AEPB Inhale 1 puff into the lungs daily. 1 each 1   No facility-administered medications prior to visit.    Review of Systems  Constitutional: Negative for chills, fever, malaise/fatigue and weight loss.  HENT: Negative for hearing loss, sore throat and tinnitus.   Eyes: Negative for blurred vision and double vision.  Respiratory: Positive for cough, sputum production, shortness of breath and wheezing. Negative for hemoptysis and stridor.   Cardiovascular: Negative for chest pain, palpitations, orthopnea, leg swelling and PND.  Gastrointestinal: Negative for abdominal pain, constipation, diarrhea, heartburn, nausea and vomiting.  Genitourinary: Negative for dysuria, hematuria and urgency.  Musculoskeletal: Negative for joint pain and myalgias.  Skin: Negative for itching and rash.  Neurological: Negative for dizziness, tingling, weakness and headaches.  Endo/Heme/Allergies: Negative for environmental allergies. Does not bruise/bleed easily.  Psychiatric/Behavioral: Negative for depression. The patient is not nervous/anxious and does not have insomnia.   All other systems reviewed and are negative.    Objective:  Physical Exam Vitals reviewed.  Constitutional:      General:  She is not in acute distress.    Appearance: She is well-developed.  HENT:     Head: Normocephalic and atraumatic.     Mouth/Throat:     Pharynx: No oropharyngeal exudate.  Eyes:     Conjunctiva/sclera: Conjunctivae normal.     Pupils: Pupils are equal, round, and reactive to light.  Neck:     Vascular: No JVD.     Trachea: No tracheal deviation.     Comments: Loss of supraclavicular fat Cardiovascular:     Rate and Rhythm: Normal rate and regular rhythm.     Heart sounds: S1 normal and S2 normal.     Comments: Distant heart tones Pulmonary:     Effort: No tachypnea or accessory muscle usage.     Breath sounds: No stridor. Decreased breath sounds (throughout all lung fields) and wheezing present. No rhonchi or rales.  Abdominal:     General: Bowel sounds are normal. There is no distension.     Palpations: Abdomen is soft.     Tenderness: There is no abdominal tenderness.  Musculoskeletal:        General: Deformity (muscle wasting ) present.  Skin:    General: Skin is warm and dry.     Capillary Refill: Capillary refill takes less than 2 seconds.     Findings: No rash.  Neurological:     Mental Status: She is alert and oriented to person, place, and time.  Psychiatric:        Behavior: Behavior normal.      Vitals:   04/30/21 1513  BP: 140/68  Pulse: 75  Temp: (!) 97.3 F (36.3 C)  TempSrc: Temporal  SpO2: 91%  Weight: 159 lb (72.1 kg)  Height: $Remove'5\' 9"'TYvrGvj$  (1.753 m)   91% on RA BMI Readings from Last 3 Encounters:  04/30/21 23.48 kg/m  04/28/21 23.05 kg/m  04/07/21 29.82 kg/m   Wt Readings from Last 3 Encounters:  04/30/21 159 lb (72.1 kg)  04/28/21 156 lb 1.6 oz (70.8 kg)  04/07/21 201 lb 15.1 oz (91.6 kg)     CBC    Component Value Date/Time   WBC 22.3 (H) 04/14/2021 0524   RBC 4.43 04/14/2021 0524   HGB 13.8 04/14/2021 0524   HCT 40.3 04/14/2021 0524   PLT 225  04/14/2021 0524   MCV 91.0 04/14/2021 0524   MCH 31.2 04/14/2021 0524   MCHC 34.2  04/14/2021 0524   RDW 12.4 04/14/2021 0524   LYMPHSABS 1.0 04/12/2021 0553   MONOABS 0.9 04/12/2021 0553   EOSABS 0.0 04/12/2021 0553   BASOSABS 0.0 04/12/2021 0553     Chest Imaging: CT chest 04/08/2021: 3.7 x 3.7 right upper lobe mass concerning for malignancy. The patient's images have been independently reviewed by me.    Pulmonary Functions Testing Results: No flowsheet data found.  FeNO:   Pathology:   Echocardiogram:   Heart Catheterization:     Assessment & Plan:     ICD-10-CM   1. Stage 4 lung cancer, right (HCC)  C34.91 Amb Referral to Palliative Care  2. Chronic obstructive pulmonary disease, unspecified COPD type ([place])  J44.9   3. Adenocarcinoma of right lung ([place])  C34.91   4. Metastatic cancer to brain [facility])  C79.31     Discussion:  Stage IV adenocarcinoma of the lung in a 70 year old, former smoker.  Recently admitted for neurologic change found to have metastatic disease to the brain.  She has completed whole brain radiation.  Has establish care with medical oncology not felt to be strong enough to initiate chemotherapy but considerations for immune therapy.  Patient's family are very confused about her prognosis and next steps.  I encouraged them to reach out to medical oncology.  Plan: Samples today of Breztri inhaler. Can use albuterol nebulizer as needed for shortness of breath and wheezing. Complete antibiotic course that was recently started by primary care. If her shortness of breath and wheezing gets worse may benefit from a short course of steroids. Patient's family to call us if she has any further breathing trouble. Would agree with palliative care referral.  Requested by patient's family.  Discussed importance for consideration of DNR status and completion of [place] form.   Current Outpatient Medications:  .  acetaminophen (TYLENOL) 500 MG tablet, Take 1 tablet (500 mg total) by mouth every 8 (eight) hours., Disp: 60 tablet,  Rfl:  .  albuterol (PROVENTIL) (2.5 MG/3ML) 0.083% nebulizer solution, Take 3 mLs (2.5 mg total) by nebulization every 4 (four) hours as needed for wheezing or shortness of breath., Disp: 75 mL, Rfl: 1 .  albuterol (VENTOLIN HFA) 108 (90 Base) MCG/ACT inhaler, Inhale 1-2 puffs into the lungs every 4 (four) hours as needed for wheezing or shortness of breath., Disp: 1 each, Rfl: 0 .  ALPRAZolam (XANAX) 0.5 MG tablet, TAKE 1 TABLET BY MOUTH THREE TIMES DAILY AS NEEDED, Disp: 20 tablet, Rfl: 0 .  amoxicillin-clavulanate (AUGMENTIN) 875-125 MG tablet, Take 1 tablet by mouth every 12 (twelve) hours., Disp: 6 tablet, Rfl: 0 .  aspirin EC 81 MG EC tablet, Take 1 tablet (81 mg total) by mouth daily. Swallow whole., Disp: 30 tablet, Rfl: 11 .  atorvastatin (LIPITOR) 40 MG tablet, Take 1 tablet (40 mg total) by mouth daily., Disp: 90 tablet, Rfl: 1 .  blood glucose meter kit and supplies, Dispense based on patient and insurance preference. Use up to 3 times daily as directed. (FOR ICD-10 E10.9, E11.9)., Disp: 1 each, Rfl: 0 .  Budeson-Glycopyrrol-Formoterol (BREZTRI AEROSPHERE) 160-9-4.8 MCG/ACT AERO, Inhale 2 puffs into the lungs in the morning and at bedtime., Disp: 5.9 g, Rfl: 0 .  budesonide-formoterol (SYMBICORT) 160-4.5 MCG/ACT inhaler, Inhale 1 puff into the lungs 2 (two) times daily., Disp: 1 each, Rfl: 2 .  chlorthalidone (HYGROTON) 25 MG tablet, Take  25 mg by mouth daily. With food, Disp: , Rfl:  .  CIPRODEX OTIC suspension, SMARTSIG:In Ear(s), Disp: , Rfl:  .  cyclobenzaprine (FLEXERIL) 10 MG tablet, Take 0.5 tablets (5 mg total) by mouth 3 (three) times daily as needed for muscle spasms. For muscle spasm, Disp: 30 tablet, Rfl: 0 .  dexamethasone (DECADRON) 4 MG tablet, Take 1 tablet (4 mg total) by mouth 3 (three) times daily., Disp: 90 tablet, Rfl: 0 .  diltiazem (CARDIZEM CD) 240 MG 24 hr capsule, Take 1 capsule (240 mg total) by mouth daily., Disp: 90 capsule, Rfl: 1 .  gabapentin (NEURONTIN) 300  MG capsule, Take 1 capsule (300 mg total) by mouth 3 (three) times daily., Disp: 90 capsule, Rfl: 1 .  insulin isophane & regular human (NOVOLIN 70/30 FLEXPEN RELION) (70-30) 100 UNIT/ML KwikPen, Inject 30 units subcutaneously twice a day before breakfast and dinner, Disp: 30 mL, Rfl: 1 .  Insulin Pen Needle (PEN NEEDLES) 31G X 6 MM MISC, 1 pen by Does not apply route in the morning and at bedtime., Disp: 100 each, Rfl: 0 .  malathion (OVIDE) 0.5 % lotion, Apply topically., Disp: , Rfl:  .  metFORMIN (GLUCOPHAGE-XR) 500 MG 24 hr tablet, Take 1 tablet (500 mg total) by mouth in the morning and at bedtime., Disp: 180 tablet, Rfl: 1 .  metoprolol tartrate (LOPRESSOR) 100 MG tablet, Take 100 mg by mouth 2 (two) times daily., Disp: , Rfl:  .  nicotine (NICODERM CQ - DOSED IN MG/24 HOURS) 21 mg/24hr patch, Place 1 patch (21 mg total) onto the skin daily., Disp: 28 patch, Rfl: 1 .  nystatin (MYCOSTATIN) 100000 UNIT/ML suspension, Take 5 mLs (500,000 Units total) by mouth 4 (four) times daily., Disp: 60 mL, Rfl: 0 .  ondansetron (ZOFRAN) 4 MG tablet, Take 1 tablet (4 mg total) by mouth daily as needed for nausea or vomiting., Disp: 30 tablet, Rfl: 1 .  oxyCODONE (OXY IR/ROXICODONE) 5 MG immediate release tablet, TAKE (1) TABLET BY MOUTH EVERY 6 HOURS AS NEEDED FOR 14 DAYS., Disp: 30 tablet, Rfl: 0 .  umeclidinium bromide (INCRUSE ELLIPTA) 62.5 MCG/INH AEPB, Inhale 1 puff into the lungs daily., Disp: 1 each, Rfl: 1   Garner Nash, DO [facility] 04/30/2021 6:08 PM

## 2021-04-30 NOTE — Patient Instructions (Addendum)
Thank you for visiting Dr. Valeta Harms at [facility]. Today we recommend the following:  Orders Placed This Encounter  Procedures  . Amb Referral to Palliative Care   Continue inhalers Breztri samples  Return in about 2 months (around 06/30/2021) for with APP or Dr. Valeta Harms.    Please do your part to reduce the spread of COVID-19.

## 2021-05-06 ENCOUNTER — Telehealth: Payer: Self-pay

## 2021-05-06 NOTE — Telephone Encounter (Signed)
Spoke with patient's daughter [name] and scheduled an in-person Palliative Consult for 06/04/21 @ 12:30PM  COVID screening was negative. No pets in home. Patient lives with sister & daughter.  Consent obtained; updated Outlook/Netsmart/Team List and Epic.   Family is aware they may be receiving a call from NP the day before or day of to confirm appointment.

## 2021-05-13 ENCOUNTER — Ambulatory Visit (HOSPITAL_COMMUNITY): Payer: Medicare Other | Admitting: Hematology

## 2021-05-18 ENCOUNTER — Encounter (HOSPITAL_COMMUNITY): Payer: Self-pay

## 2021-05-19 ENCOUNTER — Other Ambulatory Visit: Payer: Self-pay

## 2021-05-19 ENCOUNTER — Inpatient Hospital Stay (HOSPITAL_COMMUNITY): Payer: Medicare Other | Attending: Hematology and Oncology | Admitting: Hematology and Oncology

## 2021-05-19 VITALS — BP 129/78 | HR 84 | Temp 98.4°F | Resp 18 | Wt 169.0 lb

## 2021-05-19 DIAGNOSIS — Z7951 Long term (current) use of inhaled steroids: Secondary | ICD-10-CM | POA: Diagnosis not present

## 2021-05-19 DIAGNOSIS — C3431 Malignant neoplasm of lower lobe, right bronchus or lung: Secondary | ICD-10-CM | POA: Insufficient documentation

## 2021-05-19 DIAGNOSIS — Z794 Long term (current) use of insulin: Secondary | ICD-10-CM | POA: Insufficient documentation

## 2021-05-19 DIAGNOSIS — C3490 Malignant neoplasm of unspecified part of unspecified bronchus or lung: Secondary | ICD-10-CM | POA: Diagnosis not present

## 2021-05-19 DIAGNOSIS — I10 Essential (primary) hypertension: Secondary | ICD-10-CM | POA: Insufficient documentation

## 2021-05-19 DIAGNOSIS — G720 Drug-induced myopathy: Secondary | ICD-10-CM | POA: Diagnosis not present

## 2021-05-19 DIAGNOSIS — Z7982 Long term (current) use of aspirin: Secondary | ICD-10-CM | POA: Diagnosis not present

## 2021-05-19 DIAGNOSIS — Z79899 Other long term (current) drug therapy: Secondary | ICD-10-CM | POA: Diagnosis not present

## 2021-05-19 DIAGNOSIS — E119 Type 2 diabetes mellitus without complications: Secondary | ICD-10-CM | POA: Insufficient documentation

## 2021-05-19 DIAGNOSIS — Z923 Personal history of irradiation: Secondary | ICD-10-CM | POA: Diagnosis not present

## 2021-05-19 DIAGNOSIS — Z7952 Long term (current) use of systemic steroids: Secondary | ICD-10-CM | POA: Insufficient documentation

## 2021-05-19 DIAGNOSIS — R635 Abnormal weight gain: Secondary | ICD-10-CM

## 2021-05-19 DIAGNOSIS — J449 Chronic obstructive pulmonary disease, unspecified: Secondary | ICD-10-CM | POA: Diagnosis not present

## 2021-05-19 DIAGNOSIS — C3491 Malignant neoplasm of unspecified part of right bronchus or lung: Secondary | ICD-10-CM | POA: Diagnosis not present

## 2021-05-19 DIAGNOSIS — T380X5A Adverse effect of glucocorticoids and synthetic analogues, initial encounter: Secondary | ICD-10-CM

## 2021-05-19 DIAGNOSIS — C7931 Secondary malignant neoplasm of brain: Secondary | ICD-10-CM | POA: Diagnosis present

## 2021-05-20 ENCOUNTER — Encounter (HOSPITAL_COMMUNITY): Payer: Self-pay | Admitting: Hematology and Oncology

## 2021-05-20 DIAGNOSIS — T380X5A Adverse effect of glucocorticoids and synthetic analogues, initial encounter: Secondary | ICD-10-CM | POA: Insufficient documentation

## 2021-05-20 NOTE — Assessment & Plan Note (Signed)
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

## 2021-05-20 NOTE — Assessment & Plan Note (Signed)
She is diffusely edematous due to fluid retention Continue medical management

## 2021-05-20 NOTE — Assessment & Plan Note (Signed)
She has poor performance status due to radiation therapy to the brain and steroid myopathy I am attempting to get her off steroids She will continue physical therapy

## 2021-05-20 NOTE — Assessment & Plan Note (Signed)
She has expiratory wheeze on exam She will continue nebulizer treatment at home She is on steroids Her oxygen saturation on room air is adequate

## 2021-05-20 NOTE — Progress Notes (Signed)
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 female is seen in the absence of her primary oncologist She is here accompanied by family member She is a poor historian.  She has poor hearing as well The patient is sitting on the wheelchair and appears cushingoid She denies pain I verified with family members that she is currently on 4 mg dexamethasone twice daily since completion of palliative radiation therapy to the brain She is very weak She is undergoing outpatient physical therapy and rehab She has chest tightness and mild intermittent cough but no hemoptysis She has profound leg swelling Her appetite is fair  I reviewed the patient's records extensive and collaborated the history with the patient. Summary of her history is as follows: Oncology History Overview Note  PD-L1 score 100%, high tmb 35mt/Mb   Adenocarcinoma of right lung ([place]  04/28/2021 Initial Diagnosis   Adenocarcinoma of right lung ([facility]    04/28/2021 Cancer Staging   Staging form: Lung, AJCC 8th Edition - Clinical: Stage IVB (cT2a, cN3, pM1c) - Signed by KDerek Jack MD on 04/28/2021    05/31/2021 -  Chemotherapy    Patient is on Treatment Plan: LUNG NSCLC FLAT DOSE PEMBROLIZUMAB Q21D         MEDICAL HISTORY:  Past Medical History:  Diagnosis Date   Anxiety    Asthma    Chronic back pain    COPD (chronic obstructive pulmonary disease) ([facility]    on home O2   Di[place] without complication [place]    HOH (hard of hearing)    Hypertension    Lumbar radiculopathy     SURGICAL HISTORY: Past Surgical History:  Procedure Laterality Date   CESAREAN SECTION     FINE NEEDLE ASPIRATION  04/13/2021    Procedure: FINE NEEDLE ASPIRATION (FNA) LINEAR;  Surgeon: IGarner Nash DO;  [facility];  Service: Pulmonary;;   TONSILLECTOMY     VIDEO BRONCHOSCOPY WITH ENDOBRONCHIAL ULTRASOUND Bilateral 04/13/2021   Procedure: VIDEO BRONCHOSCOPY WITH ENDOBRONCHIAL ULTRASOUND;  Surgeon: IGarner Nash DO;  [facility];  Service: Pulmonary;  Laterality: Bilateral;    SOCIAL HISTORY: Social History   Socioeconomic History   Marital status: Divorced    Spouse name: Not on file   Number of children: Not on file   Years of education: Not on file   Highest education level: Not on file  Occupational History   Not on file  Tobacco Use   Smoking status: Former    Packs/day: 0.50    Years: 40.00    Pack years: 20.00    Types: Cigarettes    Quit date: 04/11/2021    Years since quitting: 0.1   Smokeless tobacco: Never  Vaping Use   Vaping Use: Never used  Substance and Sexual Activity   Alcohol use: No   Drug use: Not Currently   Sexual activity: Not Currently  Other Topics Concern   Not on file  Social History Narrative   Not on file   Social Determinants of Health   Financial Resource Strain: Not on file  Food Insecurity: Not on file  Transportation Needs: Not on file  Physical Activity: Not on file  Stress: Not on file  Social Connections: Not  on file  Intimate Partner Violence: Not on file    FAMILY HISTORY: Family History  Problem Relation Age of Onset   COPD Mother    Cancer Mother        kidney   Stroke Brother    Asthma Sister     ALLERGIES:  has No Known Allergies.  MEDICATIONS:  Current Outpatient Medications  Medication Sig Dispense Refill   amoxicillin-clavulanate (AUGMENTIN) 875-125 MG tablet Take 1 tablet by mouth every 12 (twelve) hours. 6 tablet 0   aspirin EC 81 MG EC tablet Take 1 tablet (81 mg total) by mouth daily. Swallow whole. 30 tablet 11   atorvastatin (LIPITOR) 40 MG tablet Take 1 tablet (40 mg total) by mouth daily. 90 tablet  1   blood glucose meter kit and supplies Dispense based on patient and insurance preference. Use up to 3 times daily as directed. (FOR ICD-10 E10.9, E11.9). 1 each 0   Budeson-Glycopyrrol-Formoterol (BREZTRI AEROSPHERE) 160-9-4.8 MCG/ACT AERO Inhale 2 puffs into the lungs in the morning and at bedtime. 5.9 g 0   budesonide-formoterol (SYMBICORT) 160-4.5 MCG/ACT inhaler Inhale 1 puff into the lungs 2 (two) times daily. 1 each 2   chlorthalidone (HYGROTON) 25 MG tablet Take 25 mg by mouth daily. With food     CIPRODEX OTIC suspension SMARTSIG:In Ear(s)     cyclobenzaprine (FLEXERIL) 10 MG tablet Take 0.5 tablets (5 mg total) by mouth 3 (three) times daily as needed for muscle spasms. For muscle spasm 30 tablet 0   dexamethasone (DECADRON) 4 MG tablet Take 1 tablet (4 mg total) by mouth 3 (three) times daily. 90 tablet 0   diltiazem (CARDIZEM CD) 240 MG 24 hr capsule Take 1 capsule (240 mg total) by mouth daily. 90 capsule 1   famotidine (PEPCID) 20 MG tablet Take 20 mg by mouth daily as needed.     gabapentin (NEURONTIN) 300 MG capsule Take 1 capsule (300 mg total) by mouth 3 (three) times daily. 90 capsule 1   insulin isophane & regular human (NOVOLIN 70/30 FLEXPEN RELION) (70-30) 100 UNIT/ML KwikPen Inject 30 units subcutaneously twice a day before breakfast and dinner 30 mL 1   Insulin Pen Needle (PEN NEEDLES) 31G X 6 MM MISC 1 pen by Does not apply route in the morning and at bedtime. 100 each 0   ketoconazole (NIZORAL) 2 % cream Apply topically 2 (two) times daily.     malathion (OVIDE) 0.5 % lotion Apply topically.     metFORMIN (GLUCOPHAGE-XR) 500 MG 24 hr tablet Take 1 tablet (500 mg total) by mouth in the morning and at bedtime. 180 tablet 1   metoprolol tartrate (LOPRESSOR) 100 MG tablet Take 100 mg by mouth 2 (two) times daily.     nicotine (NICODERM CQ - DOSED IN MG/24 HOURS) 21 mg/24hr patch Place 1 patch (21 mg total) onto the skin daily. 28 patch 1   nystatin (MYCOSTATIN) 100000  UNIT/ML suspension Take 5 mLs (500,000 Units total) by mouth 4 (four) times daily. 60 mL 0   ondansetron (ZOFRAN) 4 MG tablet Take 1 tablet (4 mg total) by mouth daily as needed for nausea or vomiting. 30 tablet 1   oxyCODONE (OXY IR/ROXICODONE) 5 MG immediate release tablet TAKE (1) TABLET BY MOUTH EVERY 6 HOURS AS NEEDED FOR 14 DAYS. 30 tablet 0   predniSONE (DELTASONE) 20 MG tablet Take by mouth.     umeclidinium bromide (INCRUSE ELLIPTA) 62.5 MCG/INH AEPB Inhale 1 puff into the lungs daily. 1 each  1   albuterol (PROVENTIL) (2.5 MG/3ML) 0.083% nebulizer solution Take 3 mLs (2.5 mg total) by nebulization every 4 (four) hours as needed for wheezing or shortness of breath. (Patient not taking: Reported on 05/19/2021) 75 mL 1   albuterol (VENTOLIN HFA) 108 (90 Base) MCG/ACT inhaler Inhale 1-2 puffs into the lungs every 4 (four) hours as needed for wheezing or shortness of breath. (Patient not taking: Reported on 05/19/2021) 1 each 0   ALPRAZolam (XANAX) 0.5 MG tablet TAKE 1 TABLET BY MOUTH THREE TIMES DAILY AS NEEDED (Patient not taking: Reported on 05/19/2021) 20 tablet 0   No current facility-administered medications for this visit.    REVIEW OF SYSTEMS:   Constitutional: Denies fevers, chills or abnormal night sweats Eyes: Denies blurriness of vision, double vision or watery eyes Ears, nose, mouth, throat, and face: Denies mucositis or sore throat Respiratory: Denies cough, dyspnea or wheezes Cardiovascular: Denies palpitation, chest discomfort  Gastrointestinal:  Denies nausea, heartburn or change in bowel habits Skin: Denies abnormal skin rashes Lymphatics: Denies new lymphadenopathy or easy bruising Behavioral/Psych: Mood is stable, no new changes  All other systems were reviewed with the patient and are negative.  PHYSICAL EXAMINATION: ECOG PERFORMANCE STATUS: 2 - Symptomatic, <50% confined to bed  Vitals:   05/19/21 1509  BP: 129/78  Pulse: 84  Resp: 18  Temp: 98.4 F (36.9 C)   SpO2: 93%   Filed Weights   05/19/21 1509  Weight: 169 lb (76.7 kg)    GENERAL:alert, no distress and comfortable.  She is profoundly cushingoid SKIN: skin color, texture, turgor are normal, no rashes or significant lesions EYES: normal, conjunctiva are pink and non-injected, sclera clear OROPHARYNX:no exudate, normal lips, buccal mucosa, and tongue  NECK: supple, thyroid normal size, non-tender, without nodularity LYMPH:  no palpable lymphadenopathy in the cervical, axillary or inguinal LUNGS: Increased breathing effort with expiratory wheeze HEART: regular rate & rhythm and no murmurs with moderate bilateral lower extremity edema ABDOMEN:abdomen soft, non-tender and normal bowel sounds Musculoskeletal:no cyanosis of digits and no clubbing  PSYCH: alert & oriented x 3 with fluent speech NEURO: Nonfocal weakness  LABORATORY DATA:  I have reviewed the data as listed Lab Results  Component Value Date   WBC 22.3 (H) 04/14/2021   HGB 13.8 04/14/2021   HCT 40.3 04/14/2021   MCV 91.0 04/14/2021   PLT 225 04/14/2021   Recent Labs    04/07/21 2107 04/11/21 0554 04/12/21 0553 04/13/21 0522 04/14/21 0524  NA 134*   < > 139 134* 134*  K 3.3*   < > 3.5 4.1 4.2  CL 90*   < > 94* 93* 95*  CO2 35*   < > 33* 31 29  GLUCOSE 111*   < > 223* 169* 189*  BUN 18   < > 32* 29* 24*  CREATININE 0.63   < > 0.65 0.66 0.54  CALCIUM 9.3   < > 9.3 9.1 9.1  GFRNONAA >60   < > >60 >60 >60  PROT 8.2*  --   --   --   --   ALBUMIN 3.9   < > 3.7 3.4* 3.3*  AST 27  --   --   --   --   ALT 28  --   --   --   --   ALKPHOS 58  --   --   --   --   BILITOT 0.5  --   --   --   --    < > =  values in this interval not displayed.   ASSESSMENT & PLAN:  Adenocarcinoma of right lung ([place]) I have reviewed recent final report of molecular study PD-L1 testing revealed 100% positive score by IHC technique She also have high tumor mutation burden The patient is very weak She is profoundly cushingoid and has  classical signs of steroid induced myopathy I recommend dexamethasone taper Starting tomorrow, she will start 2 mg of dexamethasone twice daily for 10 days and then reduce to 2 mg daily until she returns to the cancer center for further evaluation I have ordered staging PET CT scan because her last CT imaging was in April I would defer to radiation oncologist to schedule surveillance MRI brain I explained to the patient and family the rationale of dexamethasone taper from treatment standpoint   Metastatic cancer to brain [facility]) She has completed palliative radiation therapy to the brain We will try to wean her off steroids She is doing outpatient PT/OT   Steroid-induced myopathy She has poor performance status due to radiation therapy to the brain and steroid myopathy I am attempting to get her off steroids She will continue physical therapy  WEIGHT GAIN, ABNORMAL She is diffusely edematous due to fluid retention Continue medical management  COPD (chronic obstructive pulmonary disease) ([place]) She has expiratory wheeze on exam She will continue nebulizer treatment at home She is on steroids Her oxygen saturation on room air is adequate  Orders Placed This Encounter  Procedures   IR IMAGING GUIDED PORT INSERTION    Standing Status:   Future    Standing Expiration Date:   05/19/2022    Order Specific Question:   Reason for Exam (SYMPTOM  OR DIAGNOSIS REQUIRED)    Answer:   port foir chemo    Order Specific Question:   Preferred Imaging Location?    Answer:   External   NM PET Image Initial (PI) Skull Base To Thigh    Standing Status:   Future    Standing Expiration Date:   05/19/2022    Order Specific Question:   If indicated for the ordered procedure, I authorize the administration of a radiopharmaceutical per Radiology protocol    Answer:   Yes    Order Specific Question:   Preferred imaging location?    Answer:   [facility]    Order Specific Question:   Release to patient     Answer:   Immediate    Order Specific Question:   Radiology Contrast Protocol - do NOT remove file path    Answer:   [email or web address]    All questions were answered. The patient knows to call the clinic with any problems, questions or concerns. The total time spent in the appointment was 40 minutes encounter with patients including review of chart and various tests results, discussions about plan of care and coordination of care plan   Heath Lark, MD 05/20/2021 9:11 AM

## 2021-05-20 NOTE — Assessment & Plan Note (Signed)
She has completed palliative radiation therapy to the brain We will try to wean her off steroids She is doing outpatient PT/OT

## 2021-05-20 NOTE — Progress Notes (Signed)
START OFF PATHWAY REGIMEN - Non-Small Cell Lung   [id number]:Pembrolizumab 200 mg IV D1 q21 Days:   A cycle is every 21 days:     Pembrolizumab   **Always confirm dose/schedule in your pharmacy ordering system**  Patient Characteristics: Stage IV Metastatic, Nonsquamous, Molecular Analysis Completed, Molecular Alteration Present and Eligible for Molecular Targeted Therapy, Initial Molecular Targeted Therapy, Other Mutations/Biomarkers Therapeutic Status: Stage IV Metastatic Histology: Nonsquamous Cell Broad Molecular Profiling Status: Molecular Analysis Completed Molecular Analysis Results: Alteration Present and Eligible for Molecular Targeted Therapy Molecular Alteration Present: Other Mutations/Biomarkers Conservation officer, nature of Therapy: Initial Molecular Targeted Therapy Intent of Therapy: Non-Curative / Palliative Intent, Discussed with Patient

## 2021-05-24 ENCOUNTER — Other Ambulatory Visit: Payer: Self-pay | Admitting: Student

## 2021-05-25 ENCOUNTER — Inpatient Hospital Stay (HOSPITAL_COMMUNITY): Payer: Medicare Other

## 2021-05-25 ENCOUNTER — Other Ambulatory Visit (HOSPITAL_COMMUNITY): Payer: Medicare Other | Admitting: General Practice

## 2021-05-25 ENCOUNTER — Inpatient Hospital Stay (HOSPITAL_COMMUNITY)
Admission: EM | Admit: 2021-05-25 | Discharge: 2021-06-01 | DRG: 871 | Disposition: A | Discharge status: Hospice | Payer: Medicare Other | Attending: Internal Medicine | Admitting: Internal Medicine

## 2021-05-25 ENCOUNTER — Encounter: Payer: Self-pay | Admitting: Emergency Medicine

## 2021-05-25 ENCOUNTER — Ambulatory Visit (HOSPITAL_COMMUNITY): Payer: Medicare Other

## 2021-05-25 ENCOUNTER — Emergency Department (HOSPITAL_COMMUNITY): Payer: Medicare Other

## 2021-05-25 ENCOUNTER — Telehealth: Payer: Self-pay

## 2021-05-25 ENCOUNTER — Other Ambulatory Visit: Payer: Self-pay

## 2021-05-25 ENCOUNTER — Encounter (HOSPITAL_COMMUNITY): Payer: Self-pay | Admitting: Emergency Medicine

## 2021-05-25 ENCOUNTER — Encounter (HOSPITAL_COMMUNITY): Payer: Self-pay

## 2021-05-25 DIAGNOSIS — Z7982 Long term (current) use of aspirin: Secondary | ICD-10-CM

## 2021-05-25 DIAGNOSIS — Z825 Family history of asthma and other chronic lower respiratory diseases: Secondary | ICD-10-CM

## 2021-05-25 DIAGNOSIS — Z7984 Long term (current) use of oral hypoglycemic drugs: Secondary | ICD-10-CM

## 2021-05-25 DIAGNOSIS — I10 Essential (primary) hypertension: Secondary | ICD-10-CM | POA: Diagnosis present

## 2021-05-25 DIAGNOSIS — E876 Hypokalemia: Secondary | ICD-10-CM | POA: Diagnosis present

## 2021-05-25 DIAGNOSIS — C3491 Malignant neoplasm of unspecified part of right bronchus or lung: Secondary | ICD-10-CM | POA: Diagnosis present

## 2021-05-25 DIAGNOSIS — A419 Sepsis, unspecified organism: Secondary | ICD-10-CM | POA: Diagnosis present

## 2021-05-25 DIAGNOSIS — Z923 Personal history of irradiation: Secondary | ICD-10-CM

## 2021-05-25 DIAGNOSIS — J441 Chronic obstructive pulmonary disease with (acute) exacerbation: Secondary | ICD-10-CM | POA: Diagnosis present

## 2021-05-25 DIAGNOSIS — R918 Other nonspecific abnormal finding of lung field: Secondary | ICD-10-CM | POA: Diagnosis present

## 2021-05-25 DIAGNOSIS — J9622 Acute and chronic respiratory failure with hypercapnia: Secondary | ICD-10-CM | POA: Diagnosis present

## 2021-05-25 DIAGNOSIS — J181 Lobar pneumonia, unspecified organism: Secondary | ICD-10-CM

## 2021-05-25 DIAGNOSIS — Z20822 Contact with and (suspected) exposure to covid-19: Secondary | ICD-10-CM | POA: Diagnosis present

## 2021-05-25 DIAGNOSIS — L89152 Pressure ulcer of sacral region, stage 2: Secondary | ICD-10-CM | POA: Diagnosis present

## 2021-05-25 DIAGNOSIS — J90 Pleural effusion, not elsewhere classified: Secondary | ICD-10-CM | POA: Diagnosis present

## 2021-05-25 DIAGNOSIS — G9341 Metabolic encephalopathy: Secondary | ICD-10-CM | POA: Diagnosis present

## 2021-05-25 DIAGNOSIS — G936 Cerebral edema: Secondary | ICD-10-CM | POA: Diagnosis present

## 2021-05-25 DIAGNOSIS — Z87891 Personal history of nicotine dependence: Secondary | ICD-10-CM

## 2021-05-25 DIAGNOSIS — E162 Hypoglycemia, unspecified: Secondary | ICD-10-CM | POA: Diagnosis not present

## 2021-05-25 DIAGNOSIS — E1165 Type 2 diabetes mellitus with hyperglycemia: Secondary | ICD-10-CM | POA: Diagnosis not present

## 2021-05-25 DIAGNOSIS — E872 Acidosis: Secondary | ICD-10-CM | POA: Diagnosis present

## 2021-05-25 DIAGNOSIS — R21 Rash and other nonspecific skin eruption: Secondary | ICD-10-CM | POA: Diagnosis present

## 2021-05-25 DIAGNOSIS — E119 Type 2 diabetes mellitus without complications: Secondary | ICD-10-CM

## 2021-05-25 DIAGNOSIS — Z823 Family history of stroke: Secondary | ICD-10-CM

## 2021-05-25 DIAGNOSIS — H9190 Unspecified hearing loss, unspecified ear: Secondary | ICD-10-CM | POA: Diagnosis present

## 2021-05-25 DIAGNOSIS — C7931 Secondary malignant neoplasm of brain: Secondary | ICD-10-CM | POA: Diagnosis present

## 2021-05-25 DIAGNOSIS — G8929 Other chronic pain: Secondary | ICD-10-CM | POA: Diagnosis present

## 2021-05-25 DIAGNOSIS — Z7951 Long term (current) use of inhaled steroids: Secondary | ICD-10-CM

## 2021-05-25 DIAGNOSIS — J9601 Acute respiratory failure with hypoxia: Secondary | ICD-10-CM

## 2021-05-25 DIAGNOSIS — L8990 Pressure ulcer of unspecified site, unspecified stage: Secondary | ICD-10-CM | POA: Insufficient documentation

## 2021-05-25 DIAGNOSIS — Z7952 Long term (current) use of systemic steroids: Secondary | ICD-10-CM

## 2021-05-25 DIAGNOSIS — C801 Malignant (primary) neoplasm, unspecified: Secondary | ICD-10-CM | POA: Insufficient documentation

## 2021-05-25 DIAGNOSIS — J440 Chronic obstructive pulmonary disease with acute lower respiratory infection: Secondary | ICD-10-CM | POA: Diagnosis present

## 2021-05-25 DIAGNOSIS — Z79899 Other long term (current) drug therapy: Secondary | ICD-10-CM

## 2021-05-25 DIAGNOSIS — Z9981 Dependence on supplemental oxygen: Secondary | ICD-10-CM

## 2021-05-25 DIAGNOSIS — R6520 Severe sepsis without septic shock: Secondary | ICD-10-CM | POA: Diagnosis present

## 2021-05-25 DIAGNOSIS — E11649 Type 2 diabetes mellitus with hypoglycemia without coma: Secondary | ICD-10-CM | POA: Diagnosis present

## 2021-05-25 DIAGNOSIS — Z6829 Body mass index (BMI) 29.0-29.9, adult: Secondary | ICD-10-CM

## 2021-05-25 DIAGNOSIS — Z66 Do not resuscitate: Secondary | ICD-10-CM | POA: Diagnosis present

## 2021-05-25 DIAGNOSIS — J9621 Acute and chronic respiratory failure with hypoxia: Secondary | ICD-10-CM | POA: Diagnosis present

## 2021-05-25 DIAGNOSIS — R54 Age-related physical debility: Secondary | ICD-10-CM | POA: Diagnosis present

## 2021-05-25 DIAGNOSIS — J189 Pneumonia, unspecified organism: Secondary | ICD-10-CM

## 2021-05-25 DIAGNOSIS — Z794 Long term (current) use of insulin: Secondary | ICD-10-CM

## 2021-05-25 HISTORY — DX: Malignant (primary) neoplasm, unspecified: C80.1

## 2021-05-25 LAB — BLOOD GAS, ARTERIAL
Acid-Base Excess: 9.9 mmol/L — ABNORMAL HIGH (ref 0.0–2.0)
Bicarbonate: 32.7 mmol/L — ABNORMAL HIGH (ref 20.0–28.0)
FIO2: 44
O2 Saturation: 93.4 %
Patient temperature: 37.3
pCO2 arterial: 60.1 mmHg — ABNORMAL HIGH (ref 32.0–48.0)
pH, Arterial: 7.386 (ref 7.350–7.450)
pO2, Arterial: 76.3 mmHg — ABNORMAL LOW (ref 83.0–108.0)

## 2021-05-25 LAB — URINALYSIS, ROUTINE W REFLEX MICROSCOPIC
Bilirubin Urine: NEGATIVE
Glucose, UA: 150 mg/dL — AB
Ketones, ur: NEGATIVE mg/dL
Leukocytes,Ua: NEGATIVE
Nitrite: NEGATIVE
Protein, ur: NEGATIVE mg/dL
Specific Gravity, Urine: 1.013 (ref 1.005–1.030)
pH: 6 (ref 5.0–8.0)

## 2021-05-25 LAB — CBC WITH DIFFERENTIAL/PLATELET
Band Neutrophils: 20 %
Basophils Absolute: 0 10*3/uL (ref 0.0–0.1)
Basophils Relative: 0 %
Eosinophils Absolute: 0 10*3/uL (ref 0.0–0.5)
Eosinophils Relative: 0 %
HCT: 36.2 % (ref 36.0–46.0)
Hemoglobin: 12.2 g/dL (ref 12.0–15.0)
Lymphocytes Relative: 26 %
Lymphs Abs: 2.3 10*3/uL (ref 0.7–4.0)
MCH: 31.8 pg (ref 26.0–34.0)
MCHC: 33.7 g/dL (ref 30.0–36.0)
MCV: 94.3 fL (ref 80.0–100.0)
Metamyelocytes Relative: 2 %
Monocytes Absolute: 0.4 10*3/uL (ref 0.1–1.0)
Monocytes Relative: 5 %
Myelocytes: 3 %
Neutro Abs: 5.7 10*3/uL (ref 1.7–7.7)
Neutrophils Relative %: 44 %
Platelets: 118 10*3/uL — ABNORMAL LOW (ref 150–400)
RBC: 3.84 MIL/uL — ABNORMAL LOW (ref 3.87–5.11)
RDW: 14.1 % (ref 11.5–15.5)
WBC Morphology: INCREASED
WBC: 8.9 10*3/uL (ref 4.0–10.5)
nRBC: 0.5 % — ABNORMAL HIGH (ref 0.0–0.2)

## 2021-05-25 LAB — BLOOD GAS, VENOUS
Acid-Base Excess: 12.2 mmol/L — ABNORMAL HIGH (ref 0.0–2.0)
Bicarbonate: 33.1 mmol/L — ABNORMAL HIGH (ref 20.0–28.0)
FIO2: 44
O2 Saturation: 33.1 %
Patient temperature: 37
pCO2, Ven: 67.6 mmHg — ABNORMAL HIGH (ref 44.0–60.0)
pH, Ven: 7.368 (ref 7.250–7.430)
pO2, Ven: <31 mmHg — CL (ref 32.0–45.0)

## 2021-05-25 LAB — CBG MONITORING, ED
Glucose-Capillary: 25 mg/dL — CL (ref 70–99)
Glucose-Capillary: 34 mg/dL — CL (ref 70–99)
Glucose-Capillary: 121 mg/dL — ABNORMAL HIGH (ref 70–99)
Glucose-Capillary: 79 mg/dL (ref 70–99)
Glucose-Capillary: 111 mg/dL — ABNORMAL HIGH (ref 70–99)

## 2021-05-25 LAB — GLUCOSE, CAPILLARY
Glucose-Capillary: 100 mg/dL — ABNORMAL HIGH (ref 70–99)
Glucose-Capillary: 96 mg/dL (ref 70–99)
Glucose-Capillary: 171 mg/dL — ABNORMAL HIGH (ref 70–99)

## 2021-05-25 LAB — TROPONIN I (HIGH SENSITIVITY)
Troponin I (High Sensitivity): 25 ng/L — ABNORMAL HIGH (ref <18)
Troponin I (High Sensitivity): 29 ng/L — ABNORMAL HIGH (ref <18)

## 2021-05-25 LAB — COMPREHENSIVE METABOLIC PANEL
ALT: 49 U/L — ABNORMAL HIGH (ref 0–44)
AST: 50 U/L — ABNORMAL HIGH (ref 15–41)
Albumin: 2.9 g/dL — ABNORMAL LOW (ref 3.5–5.0)
Alkaline Phosphatase: 73 U/L (ref 38–126)
Anion gap: 12 (ref 5–15)
BUN: 26 mg/dL — ABNORMAL HIGH (ref 8–23)
CO2: 36 mmol/L — ABNORMAL HIGH (ref 22–32)
Calcium: 8.6 mg/dL — ABNORMAL LOW (ref 8.9–10.3)
Chloride: 84 mmol/L — ABNORMAL LOW (ref 98–111)
Creatinine, Ser: 0.48 mg/dL (ref 0.44–1.00)
GFR, Estimated: >60 mL/min (ref >60)
Glucose, Bld: 38 mg/dL — CL (ref 70–99)
Potassium: 3.2 mmol/L — ABNORMAL LOW (ref 3.5–5.1)
Sodium: 132 mmol/L — ABNORMAL LOW (ref 135–145)
Total Bilirubin: 1.7 mg/dL — ABNORMAL HIGH (ref 0.3–1.2)
Total Protein: 6.8 g/dL (ref 6.5–8.1)

## 2021-05-25 LAB — RESP PANEL BY RT-PCR (FLU A&B, COVID) ARPGX2
Influenza A by PCR: NEGATIVE
Influenza B by PCR: NEGATIVE
SARS Coronavirus 2 by RT PCR: NEGATIVE

## 2021-05-25 LAB — LIPASE, BLOOD: Lipase: 17 U/L (ref 11–51)

## 2021-05-25 LAB — APTT: aPTT: 28 seconds (ref 24–36)

## 2021-05-25 LAB — BRAIN NATRIURETIC PEPTIDE: B Natriuretic Peptide: 95 pg/mL (ref 0.0–100.0)

## 2021-05-25 LAB — LACTIC ACID, PLASMA
Lactic Acid, Venous: 1.7 mmol/L (ref 0.5–1.9)
Lactic Acid, Venous: 2.5 mmol/L (ref 0.5–1.9)
Lactic Acid, Venous: 1.9 mmol/L (ref 0.5–1.9)
Lactic Acid, Venous: 2.1 mmol/L (ref 0.5–1.9)

## 2021-05-25 LAB — PROTIME-INR
INR: 1.1 (ref 0.8–1.2)
Prothrombin Time: 14.4 seconds (ref 11.4–15.2)

## 2021-05-25 IMAGING — CT CT HEAD W/O CM
3 series · 15 of 47 positions shown, 18 images · non-contrast
Comparison: CT head [DATE].  MRI brain [DATE]

CLINICAL DATA: Altered mental status. History of lung cancer with
brain metastasis.

EXAM:
CT HEAD WITHOUT CONTRAST
TECHNIQUE: Contiguous axial images were obtained from the base of the skull
through the vertex without intravenous contrast.

[Series 2: head w o · axial · 0.39mm/px · z∈[+1430,+1565]mm · 9 of 33 slices shown, 12 images]
[im 3/33  brain]
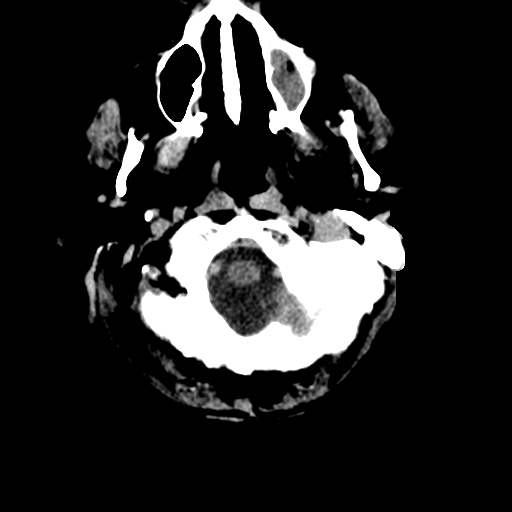
[im 3/33  bone]
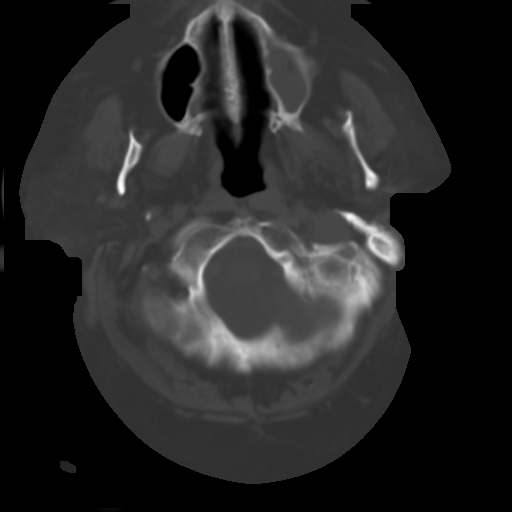
[im 6/33  brain]
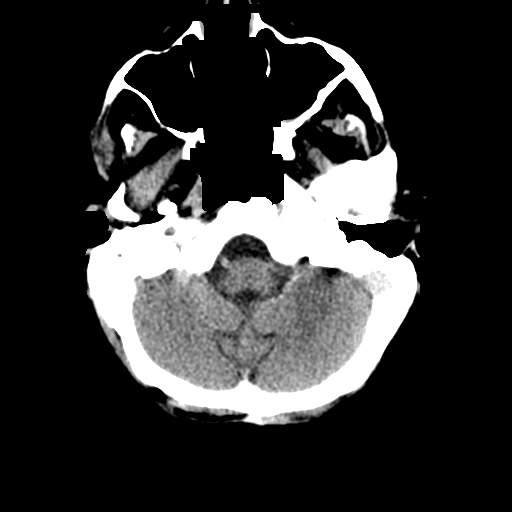
[im 9/33  brain]
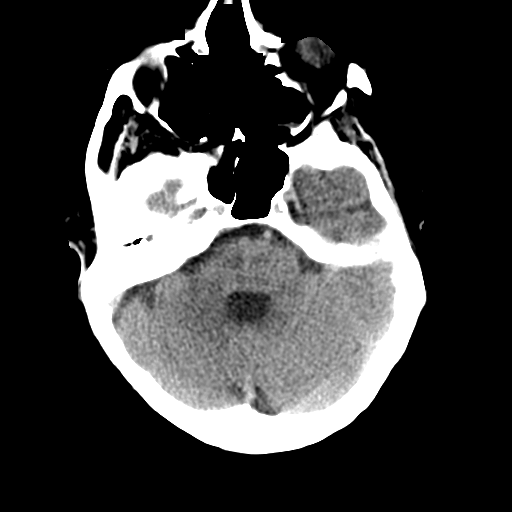
[im 13/33  brain]
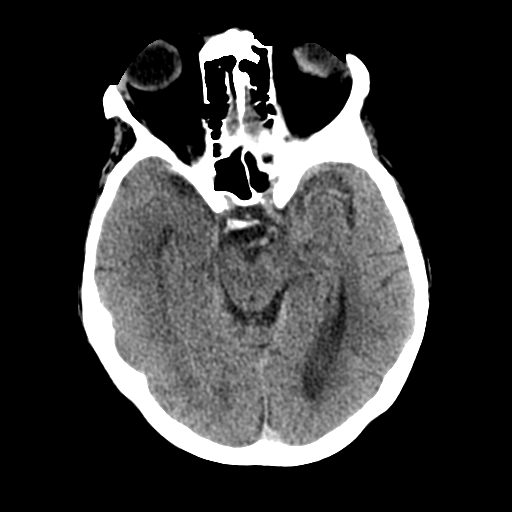
[im 17/33  brain]
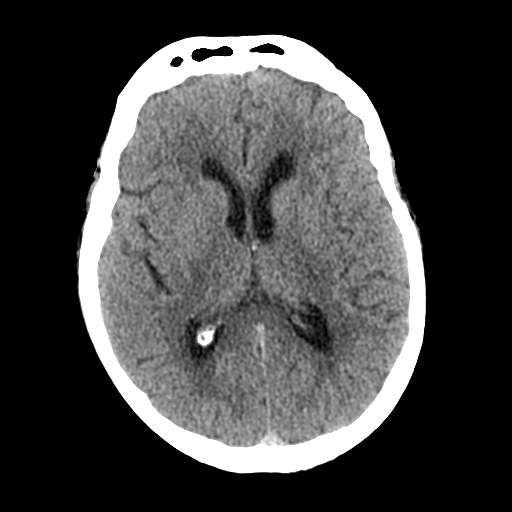
[im 17/33  bone]
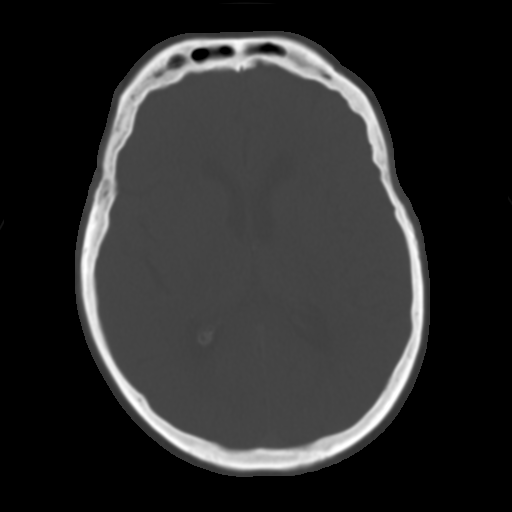
[im 20/33  brain]
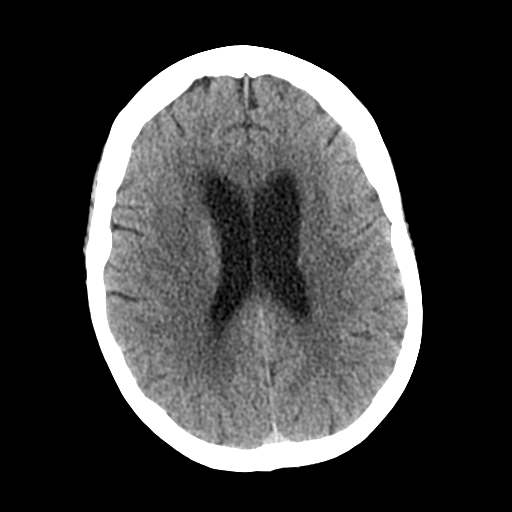
[im 24/33  brain]
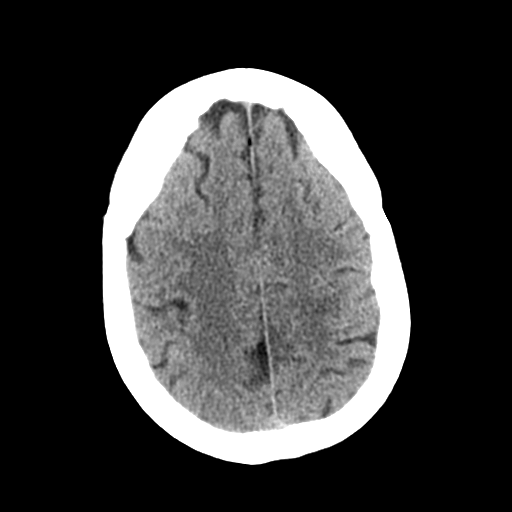
[im 27/33  brain]
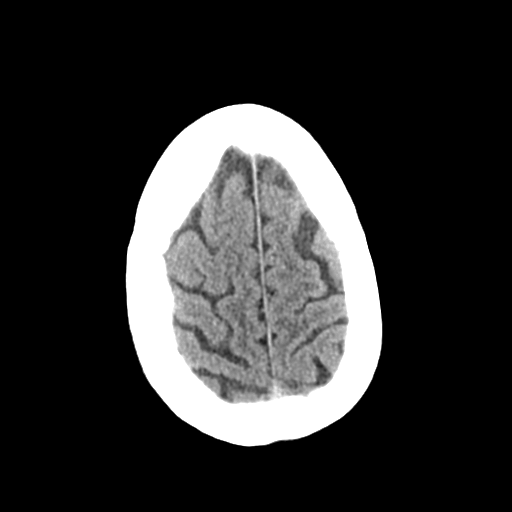
[im 30/33  brain]
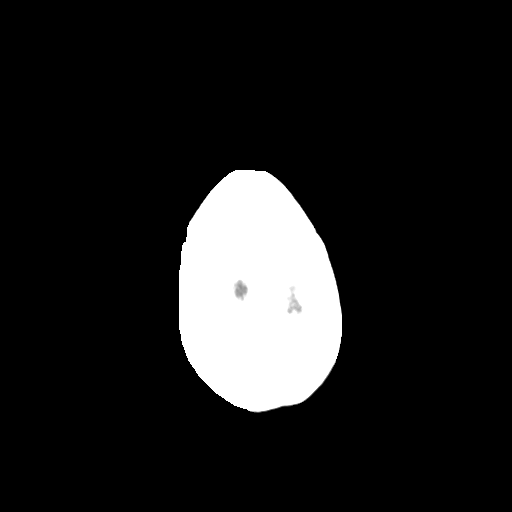
[im 30/33  bone]
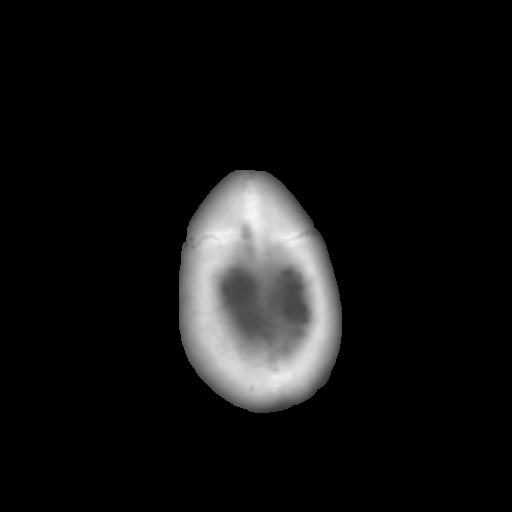

[Series 4: coronal soft · coronal · 0.34mm/px · 3 of 67 slices shown]
[im 23/67  brain]
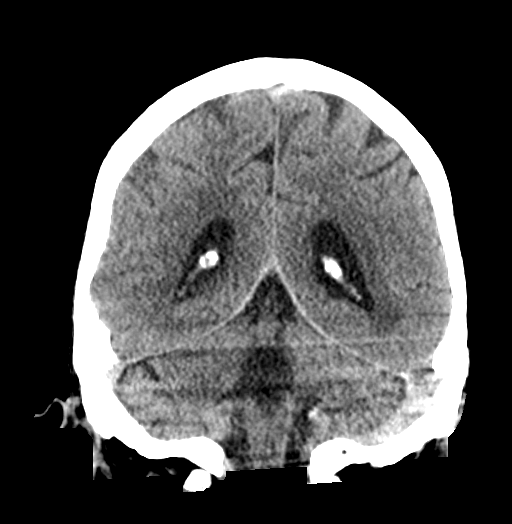
[im 30/67  brain]
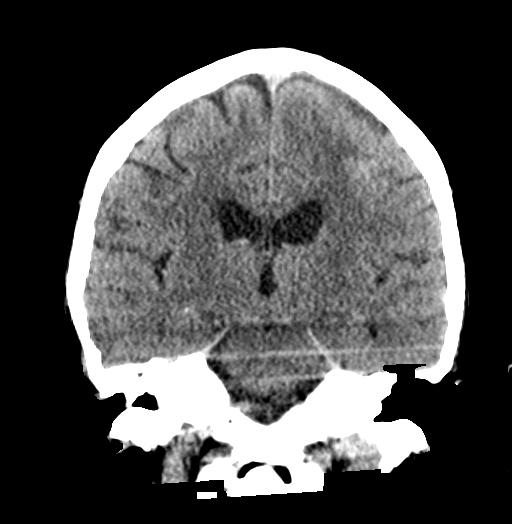
[im 37/67  brain]
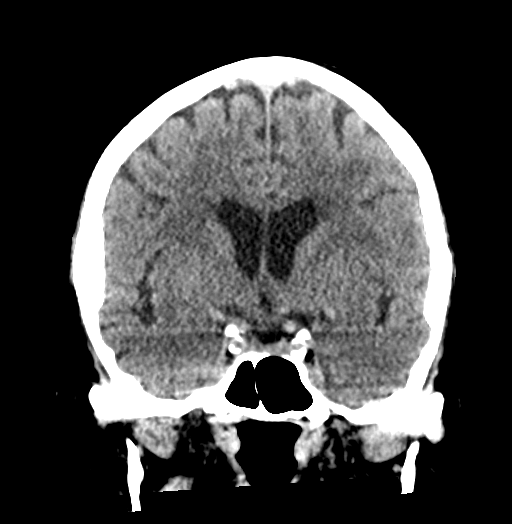

[Series 5: sagittal soft · sagittal · 0.32mm/px · 3 of 51 slices shown]
[im 17/51  brain]
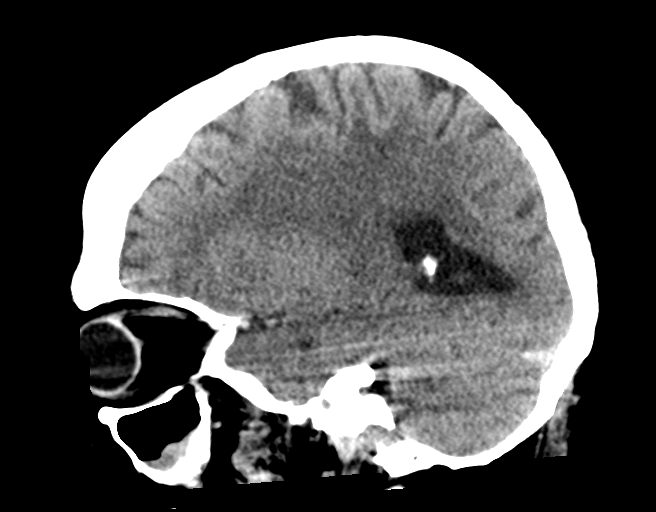
[im 26/51  brain]
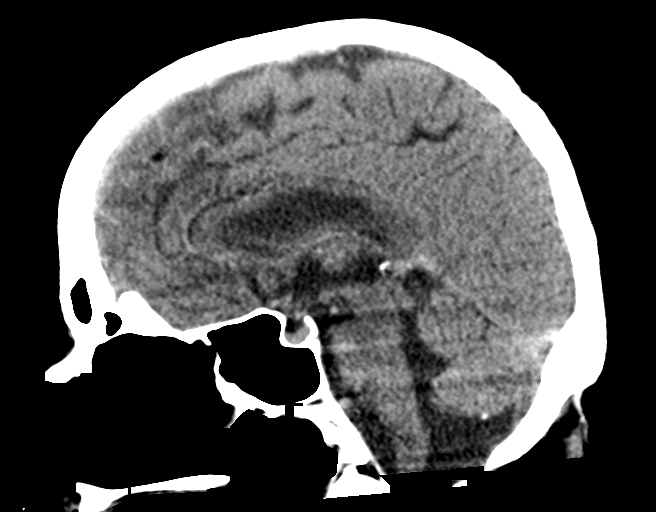
[im 34/51  brain]
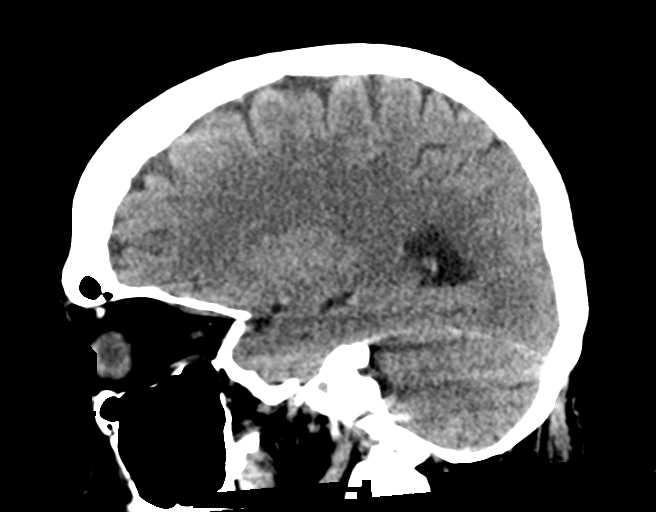

[15 of 47 positions shown; findings below may reference images not displayed]

FINDINGS: Brain: Since the prior study, there is interval decrease of areas of
vasogenic edema seen previously in the left parietal region, right
parietal region, and right occipital region. This suggest response
to interval therapy. Vague areas of slightly increased attenuation
are demonstrated corresponding to known metastatic lesions in the
parietal regions bilaterally. No mass-effect or midline shift. Mild
ventricular dilatation. Gray-white matter junctions are mostly
distinct. Basal cisterns are not effaced. No acute intracranial
hemorrhage is identified.

Vascular: Intracranial arterial vascular calcifications.

Skull: Calvarium appears intact.

Sinuses/Orbits: Mild mucosal thickening in the paranasal sinuses. No
acute air-fluid levels. Opacification of the mastoid air cells.

Other: None.
IMPRESSION: 1. Bilateral metastatic foci with interval improvement of vasogenic
edema since prior study.
2. No acute intracranial hemorrhage or significant mass effect.
3. Bilateral mastoid effusions.

## 2021-05-25 IMAGING — DX DG CHEST 1V PORT
1 series · 1 of 1 positions shown · non-contrast
Comparison: [DATE]

CLINICAL DATA: Shortness of breath.

EXAM:
PORTABLE CHEST 1 VIEW

[chest ap]
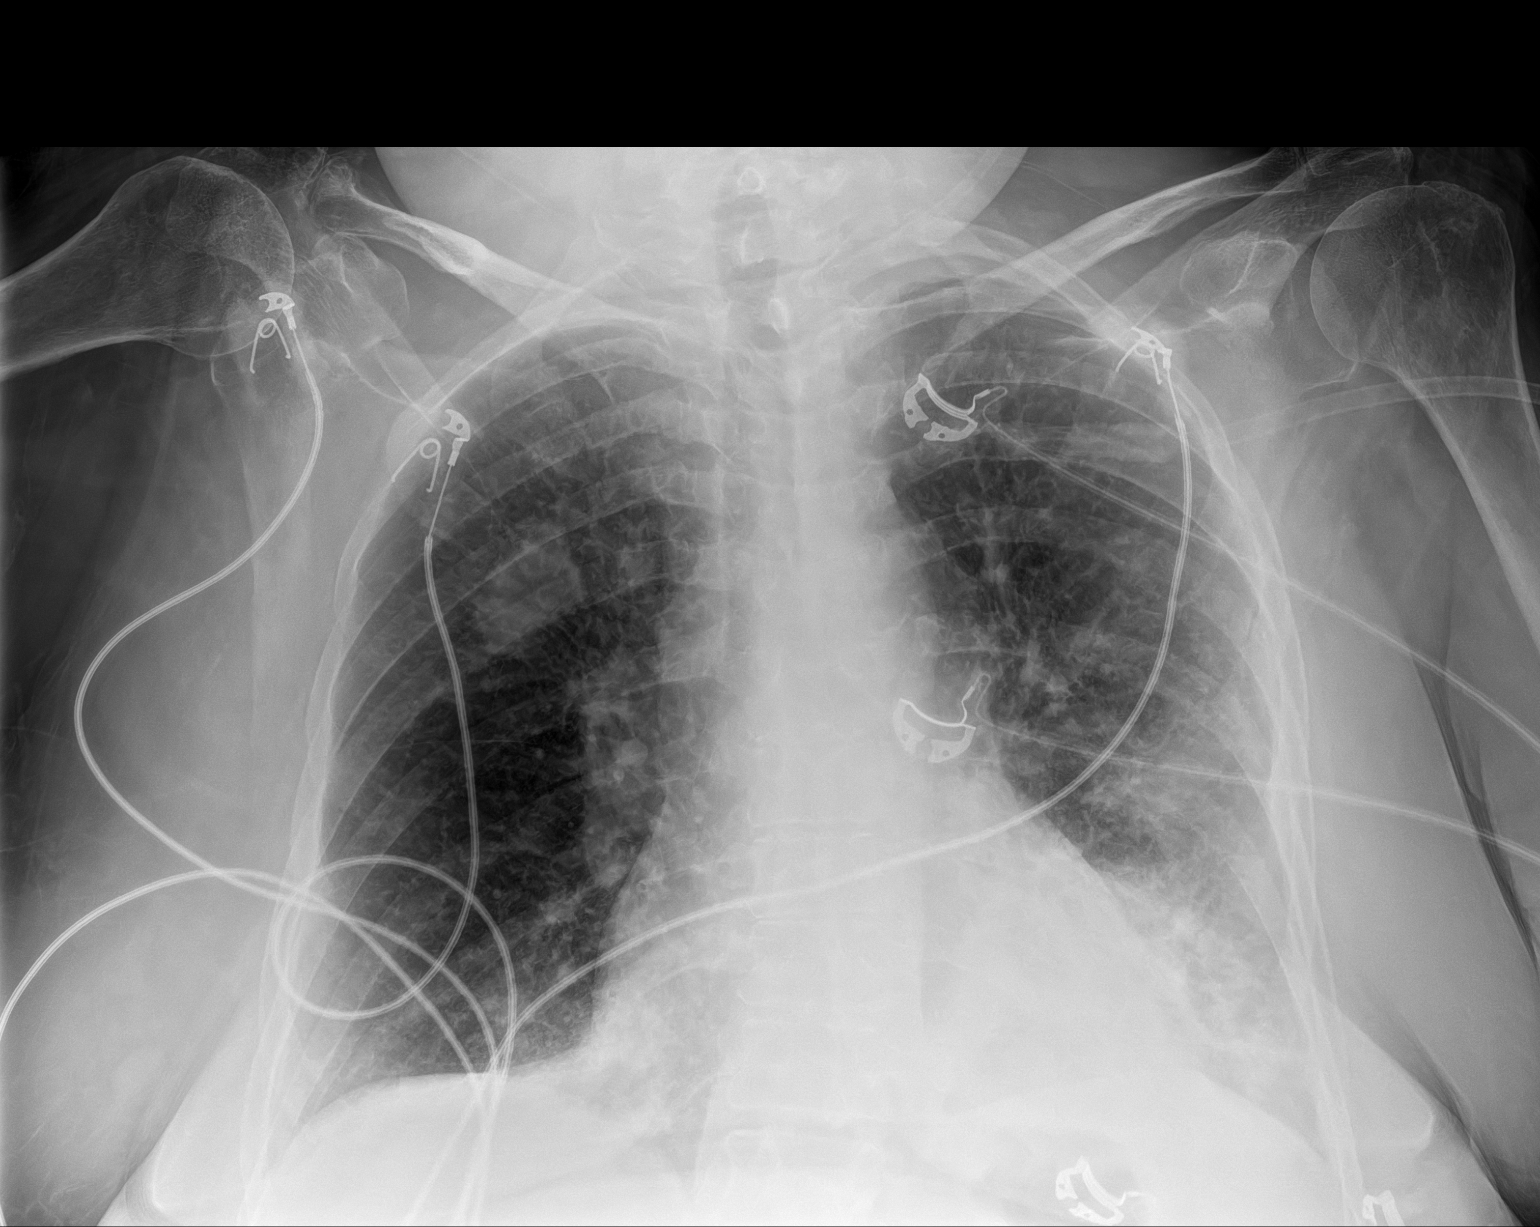

[1 of 1 positions shown; findings below may reference images not displayed]

FINDINGS: Stable heart size. Redemonstrated right upper lobe lung mass
measuring approximately 3.8 cm in diameter, stable to minimally
progressed in size from prior. New patchy consolidation within the
left mid to lower lung field. No pleural effusion or pneumothorax.
IMPRESSION: 1. New patchy consolidation within the left mid to lower lung field
concerning for pneumonia.
2. Stable to minimally progressed size of known right upper lobe
lung mass.

## 2021-05-25 IMAGING — CT CT CHEST W/ CM
2 of 3 series · 15 of 36 positions shown, 18 images · IV contrast (omnipaque)
Comparison: Chest x-ray from earlier in the same day, CT from
[DATE]

CLINICAL DATA: Known history of lung carcinoma with metastatic
disease and abnormal chest x-ray, initial encounter

EXAM:
CT CHEST WITH CONTRAST
TECHNIQUE: Multidetector CT imaging of the chest was performed during
intravenous contrast administration.
CONTRAST:  75mL OMNIPAQUE IOHEXOL 300 MG/ML  SOLN

[Series 2: routine chest with · axial · 0.60mm/px · z∈[-195,+59]mm · 12 of 149 slices shown, 15 images]
[im 11/149  mediastinal]
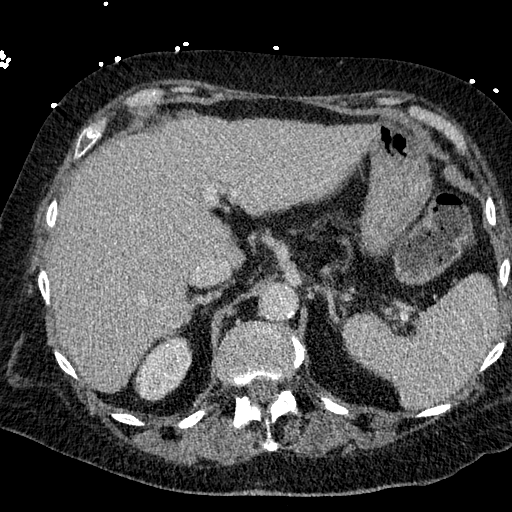
[im 11/149  lung]
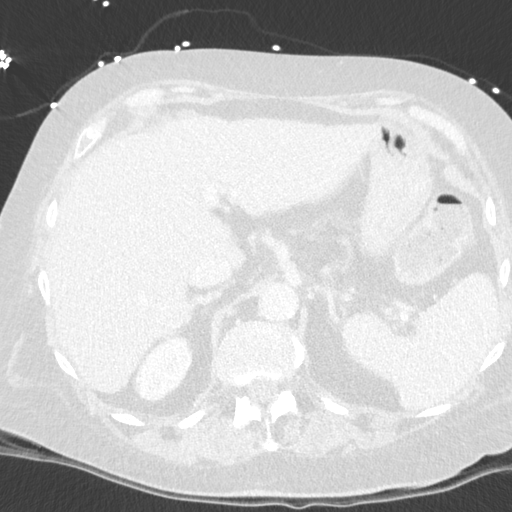
[im 22/149  lung]
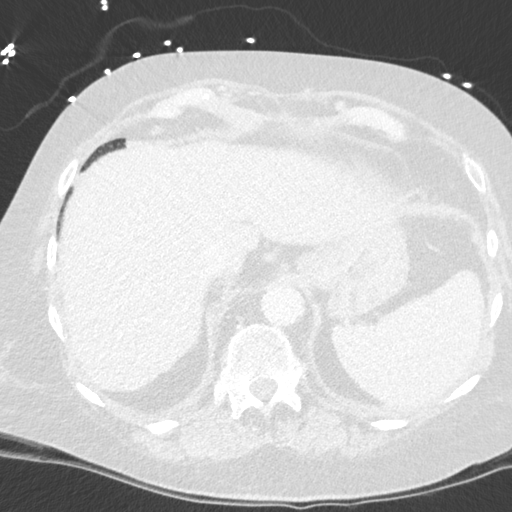
[im 33/149  lung]
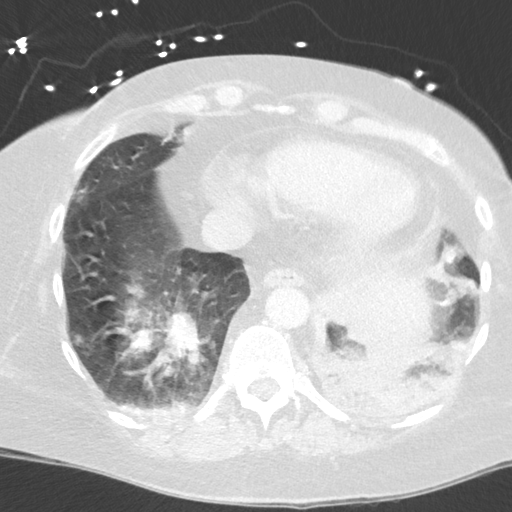
[im 44/149  lung]
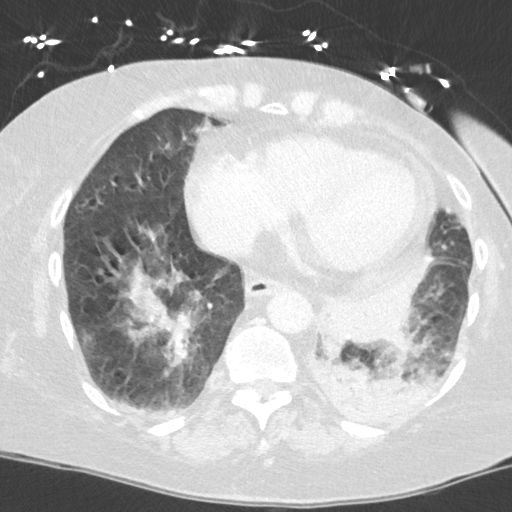
[im 55/149  mediastinal]
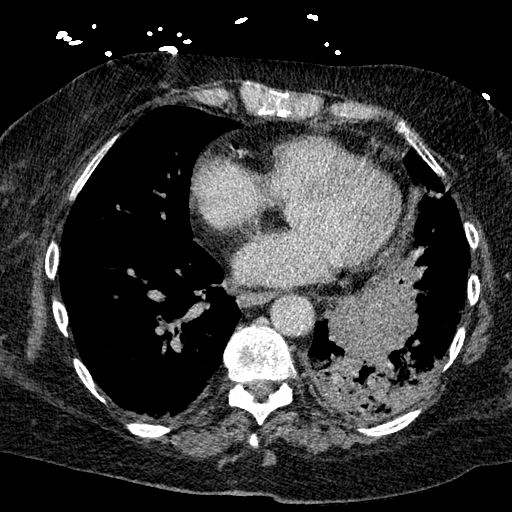
[im 55/149  lung]
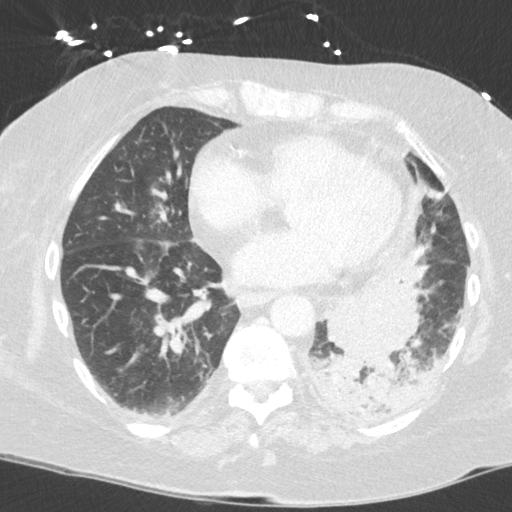
[im 66/149  lung]
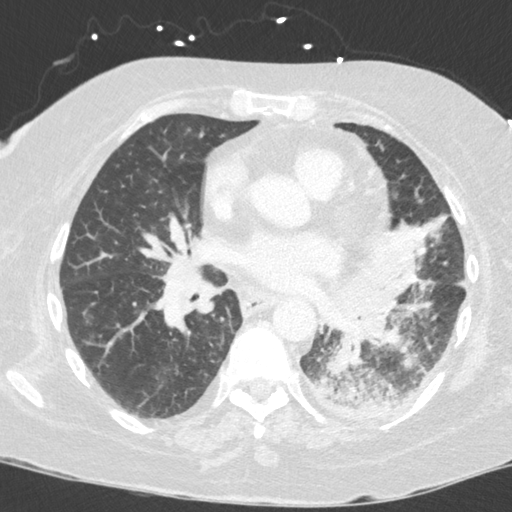
[im 83/149  lung]
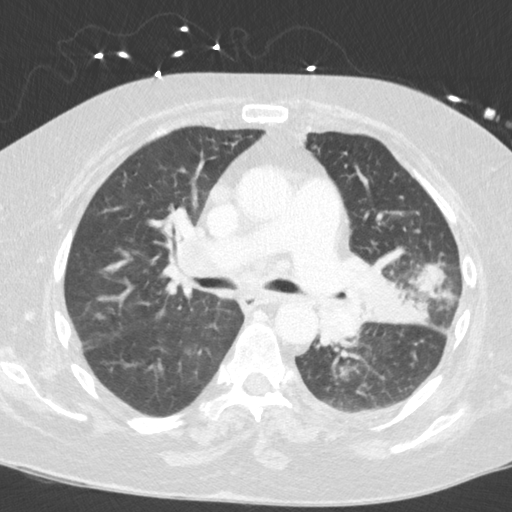
[im 94/149  lung]
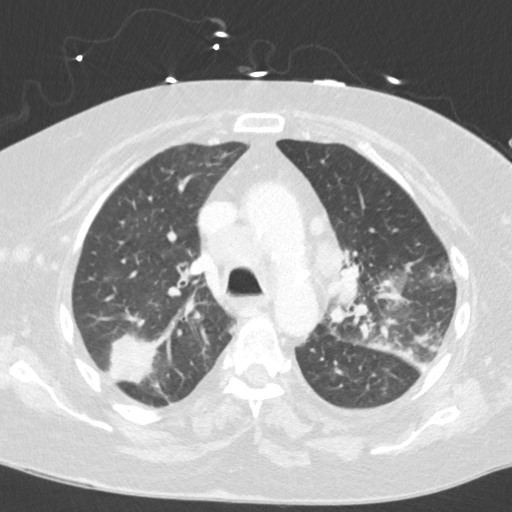
[im 105/149  mediastinal]
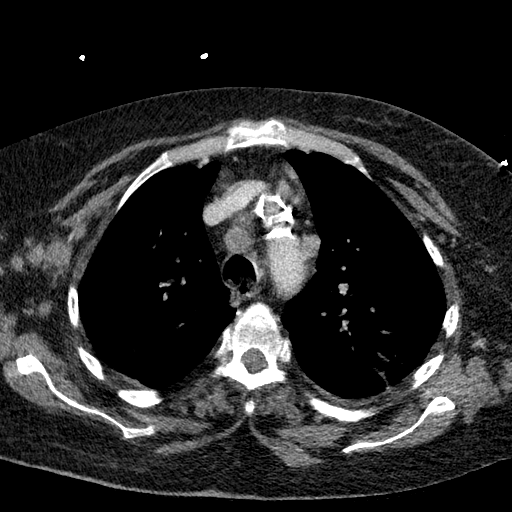
[im 105/149  lung]
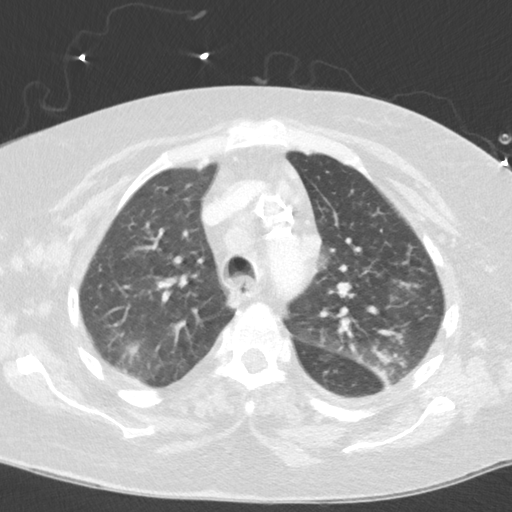
[im 116/149  lung]
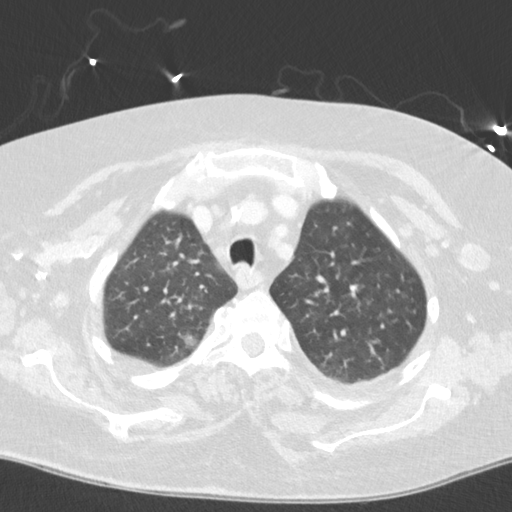
[im 127/149  lung]
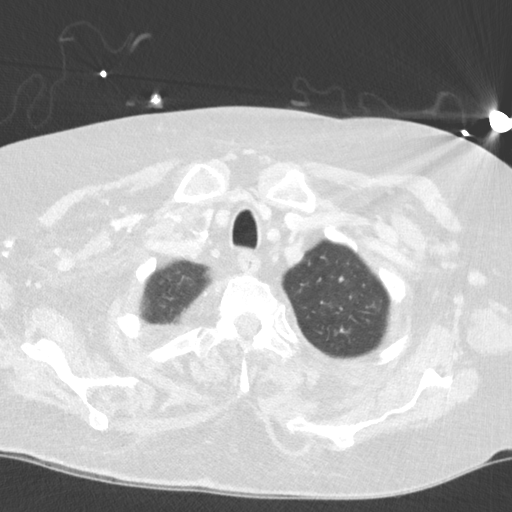
[im 138/149  lung]
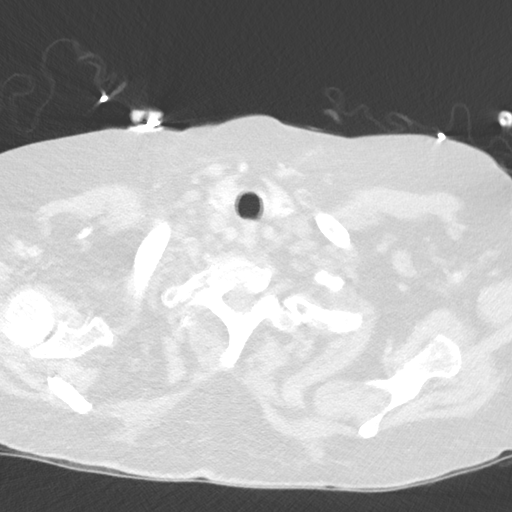

[Series 5: coronal · coronal · 0.59mm/px · 3 of 133 slices shown]
[im 27/133  lung]
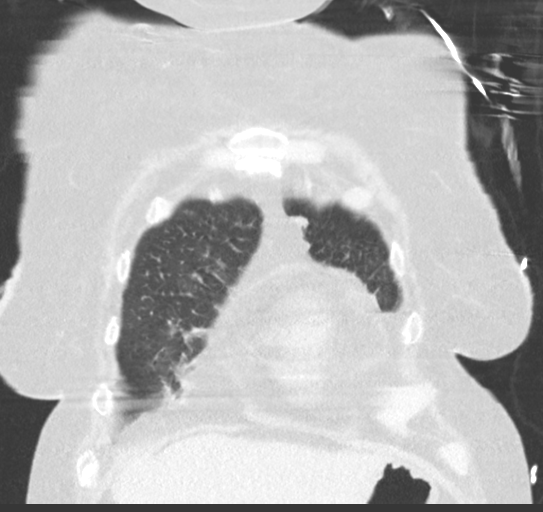
[im 53/133  lung]
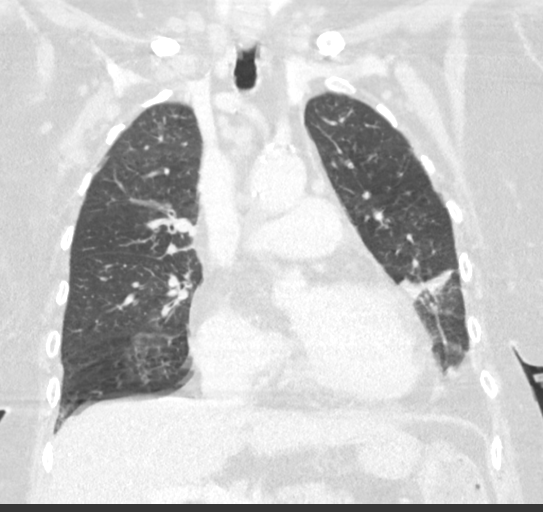
[im 80/133  lung]
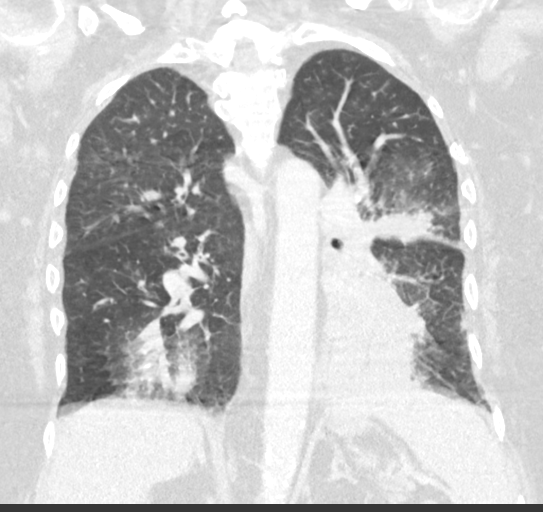

[15 of 36 positions shown; findings below may reference images not displayed]

FINDINGS: Cardiovascular: Atherosclerotic calcifications of the thoracic aorta
are noted. No aneurysmal dilatation or dissection is noted. Heart is
at the upper limits of normal in size. Mild coronary calcifications
are noted. No definitive emboli are seen although timing was not
performed for embolus evaluation.

Mediastinum/Nodes: Thoracic inlet is within normal limits. Scattered
mediastinal adenopathy is noted most prominent in the precarinal
region measuring up to 2.8 cm slightly enlarged from the prior exam.
Noted along the aortic arch measures 15 mm in short axis also
slightly enlarged when compared with the prior exam. Bilateral hilar
adenopathy is noted with a dominant 2.5 cm node stable in appearance
in the right hilum. The esophagus as visualized is within normal
limits. Right axillary adenopathy is noted slightly more prominent
than that seen on the prior exam. Left-sided subpectoral adenopathy
is noted.

Lungs/Pleura: Lungs are well aerated bilaterally. The previously
seen mass lesion along the major fissure is again identified with
slightly reduced in size measuring approximately 2.7 x 2.5 cm. It
previously measured up to 3.7 cm. Patchy infiltrative density is
noted in the right lower lobe somewhat limited in evaluation due to
patient motion artifact. Patchy infiltrate is seen in the left upper
lobe along the fissure as well as in the left lower lobe also new
from the prior exam. No sizable effusion is seen.

Upper Abdomen: Mild nodularity to the liver is noted similar to that
seen on prior CT. No other focal abnormality in the upper abdomen is
noted.

Musculoskeletal: Degenerative changes of the thoracic spine are
seen. No acute bony abnormality is noted.
IMPRESSION: Persistent right upper lobe mass although slightly smaller than that
seen on the prior CT.

Mediastinal adenopathy which is slightly more prominent than that
seen on the prior exam. Bilateral hilar adenopathy is noted as well.
Subpectoral adenopathy in right axillary adenopathy is noted in
slightly more prominent than that noted on the prior exam.

New patchy infiltrate in the left upper and lower lobe which has a
somewhat masslike appearance in the lower lobe but likely related to
extensive pneumonia given the relative abrupt onset.

Cirrhotic changes of the liver similar to that seen on prior exam.

Aortic Atherosclerosis ([FO]-[FO]).

## 2021-05-25 IMAGING — CT CT ABD-PELV W/ CM
2 of 5 series · 15 of 46 positions shown, 17 images · IV contrast (Omnipaque or Isovue)
Comparison: CT the chest, abdomen and pelvis [DATE].

CLINICAL DATA: 70-year-old female with history of abdominal pain
and fever. History of lung cancer with metastatic disease to the
brain, status post radiation therapy in [DATE].

EXAM:
CT ABDOMEN AND PELVIS WITH CONTRAST
TECHNIQUE: Multidetector CT imaging of the abdomen and pelvis was performed
using the standard protocol following bolus administration of
intravenous contrast.
CONTRAST:  100mL OMNIPAQUE IOHEXOL 300 MG/ML  SOLN

[Series 2: axial st · axial · 0.80mm/px · z∈[+784,+1194]mm · 12 of 94 slices shown, 14 images]
[im 6/94  soft-tissue]
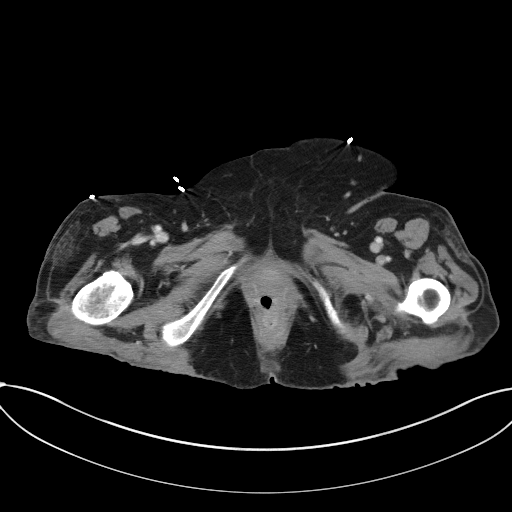
[im 6/94  bone]
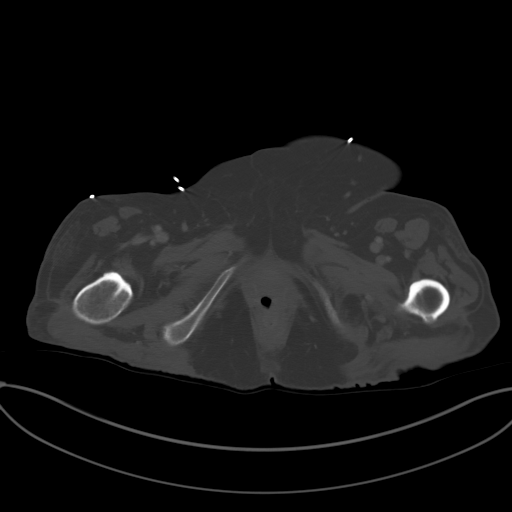
[im 16/94  soft-tissue]
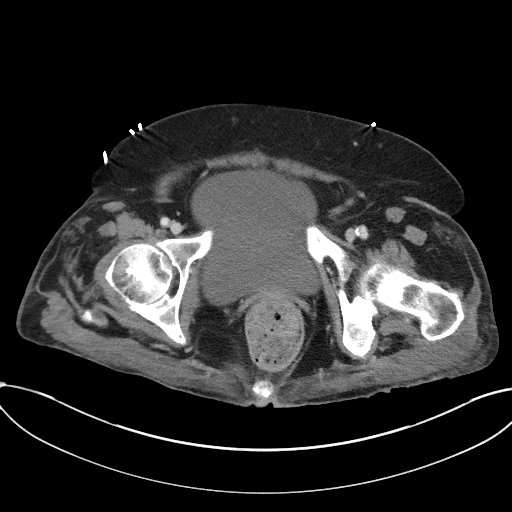
[im 21/94  soft-tissue]
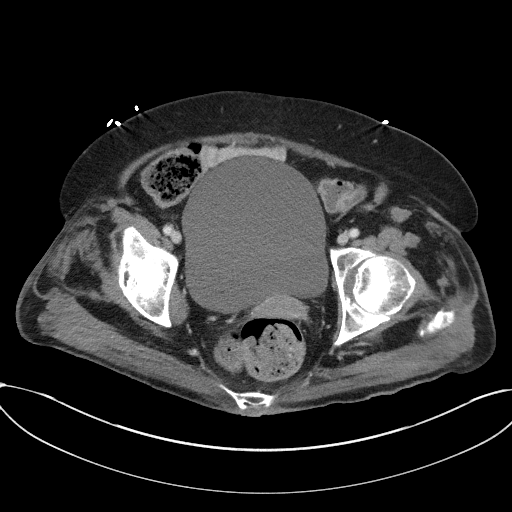
[im 26/94  soft-tissue]
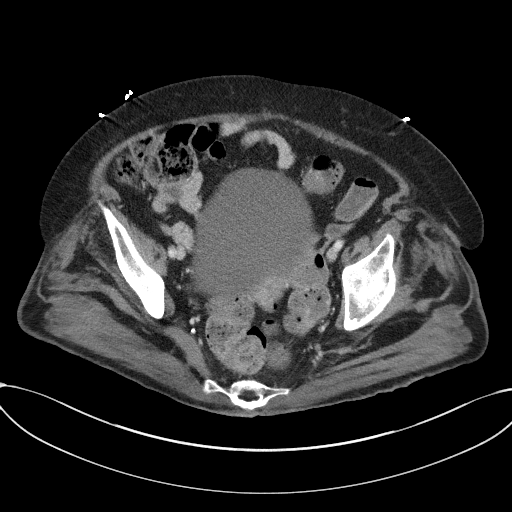
[im 37/94  soft-tissue]
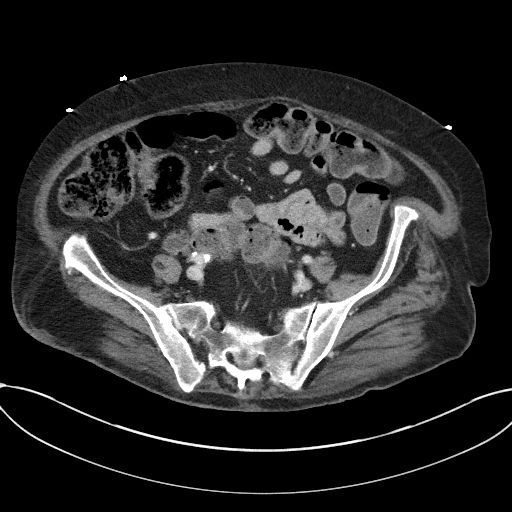
[im 42/94  soft-tissue]
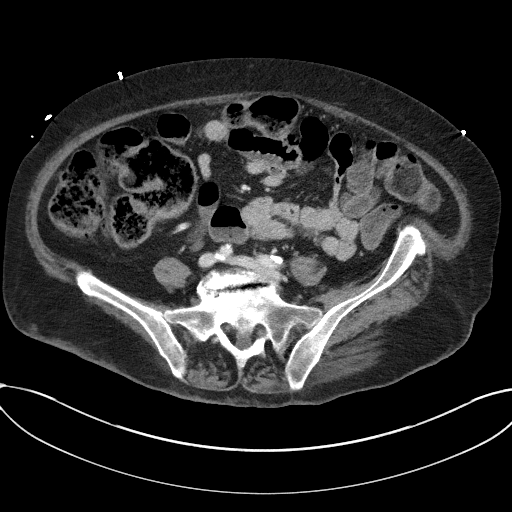
[im 52/94  soft-tissue]
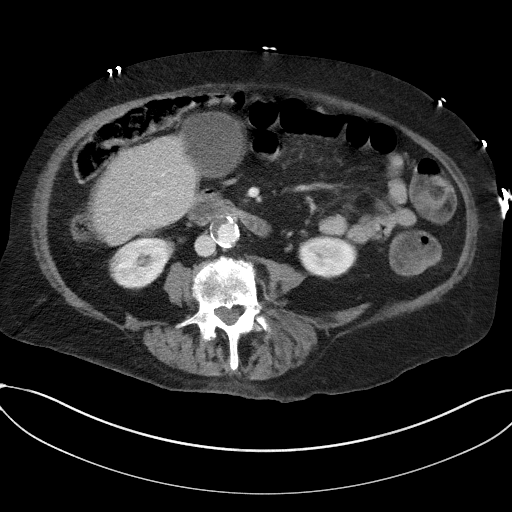
[im 57/94  soft-tissue]
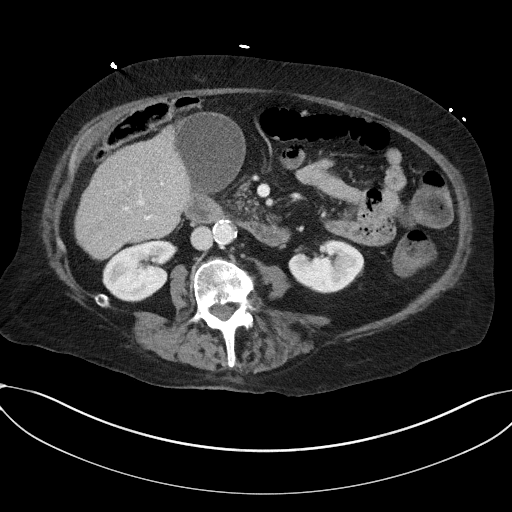
[im 68/94  soft-tissue]
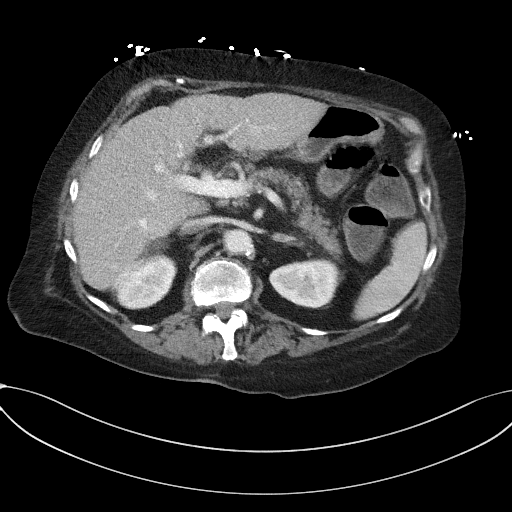
[im 68/94  bone]
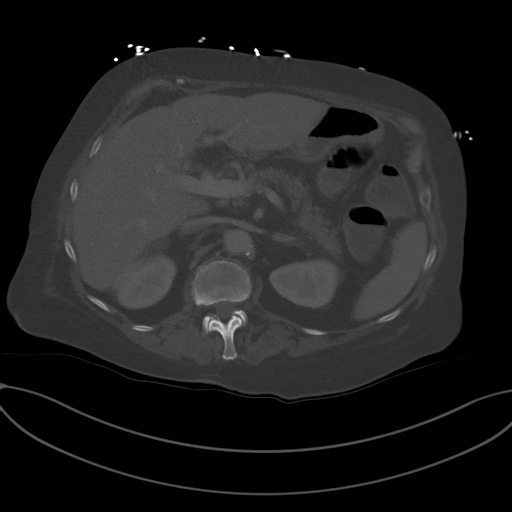
[im 73/94  soft-tissue]
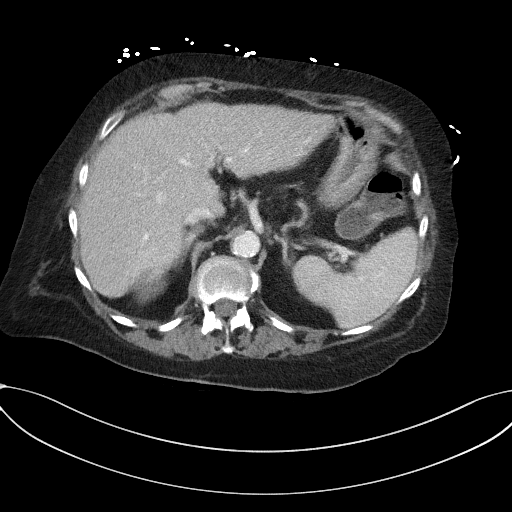
[im 78/94  soft-tissue]
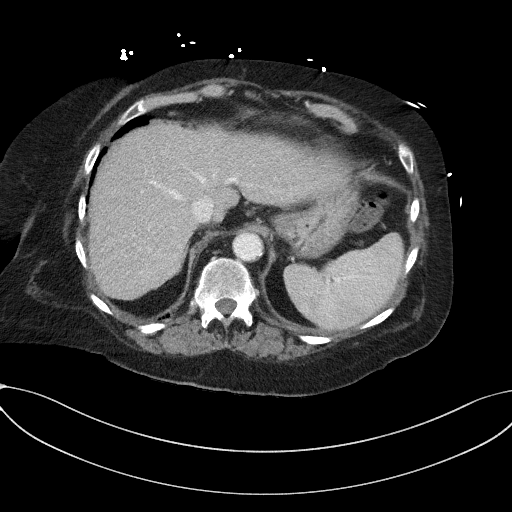
[im 88/94  soft-tissue]
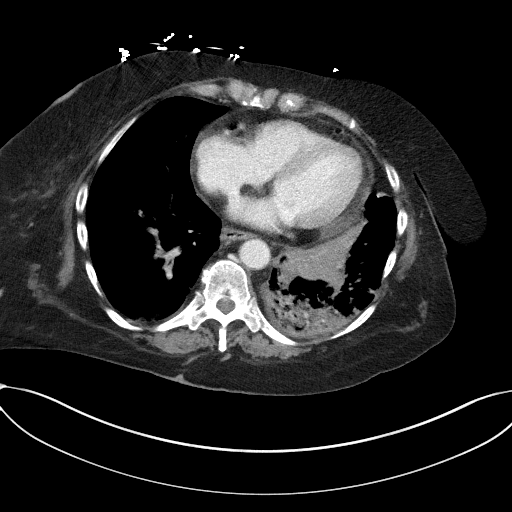

[Series 6: coronal st · coronal · 0.81mm/px · 3 of 109 slices shown]
[im 37/109  soft-tissue]
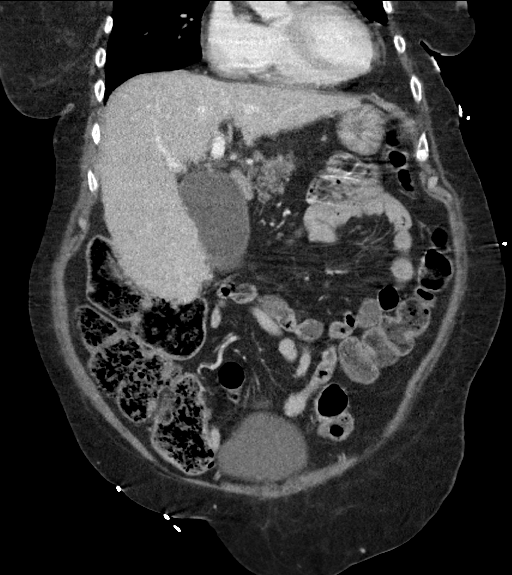
[im 49/109  soft-tissue]
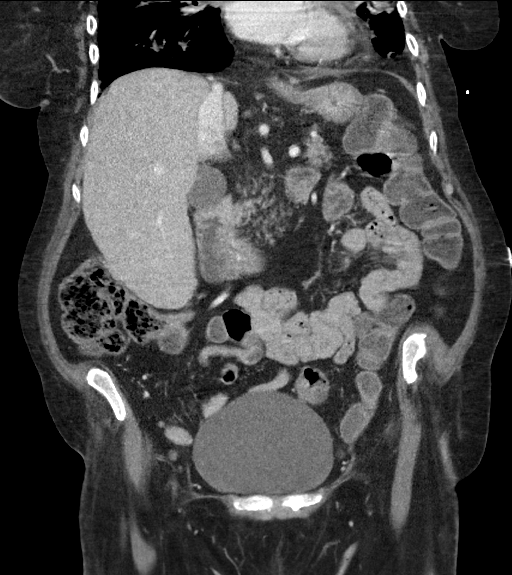
[im 61/109  soft-tissue]
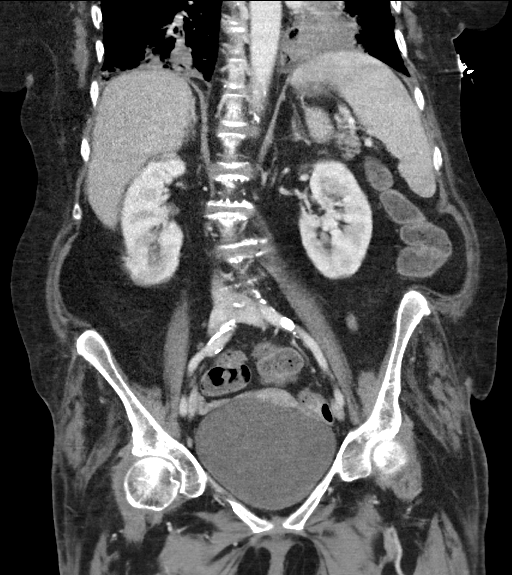

[15 of 46 positions shown; findings below may reference images not displayed]

FINDINGS: Lower chest: There is soft tissue prominence in a
peribronchovascular distribution in the lung bases bilaterally (left
greater than right). In addition, widespread areas of airspace
consolidation are noted in the lung bases bilaterally, most severe
in the left lower lobe and inferior segment of the lingula.
Atherosclerotic calcifications in the descending thoracic aorta as
well as the right coronary artery.

Hepatobiliary: Diffuse low attenuation throughout the hepatic
parenchyma, indicative of a background of hepatic steatosis. Liver
also has a shrunken appearance and nodular contour, indicative of
underlying cirrhosis. No suspicious cystic or solid hepatic lesions.
No intra or extrahepatic biliary ductal dilatation. Gallbladder is
normal in appearance.

Pancreas: No pancreatic mass. No pancreatic ductal dilatation. No
pancreatic or peripancreatic fluid collections or inflammatory
changes.

Spleen: Unremarkable.

Adrenals/Urinary Tract: Bilateral kidneys and adrenal glands are
normal in appearance. No hydroureteronephrosis. Urinary bladder is
normal in appearance.

Stomach/Bowel: The appearance of the stomach is normal. There is no
pathologic dilatation of small bowel or colon. Normal appendix.

Vascular/Lymphatic: Aortic atherosclerosis, without evidence of
aneurysm or dissection in the abdominal or pelvic vasculature. There
is some fusiform ectasia of the distal infrarenal abdominal aorta
which measures up to 2.5 x 2.2 cm in diameter shortly before the
aortic bifurcation. No lymphadenopathy noted in the abdomen or
pelvis.

Reproductive: Uterus and ovaries are atrophic.

Other: No significant volume of ascites.  No pneumoperitoneum.

Musculoskeletal: There are no aggressive appearing lytic or blastic
lesions noted in the visualized portions of the skeleton.
IMPRESSION: 1. There are no acute findings noted in the abdomen or pelvis to
account for the patient's symptoms.
2. However, the visualized portions of the lung bases are very
abnormal. Although much of these findings may relate to multilobar
pneumonia, the profound soft tissue thickening in a
peribronchovascular distribution in the lower lungs is very unusual
in appearance and raises concern for potential lymphangitic spread
of tumor. Further evaluation with contrast enhanced chest CT is
recommended at this time to better evaluate the full extent of
pulmonary disease.
3. Hepatic steatosis with hepatic cirrhosis.
4. Aortic atherosclerosis, in addition to at least right coronary
artery disease. Please note that although the presence of coronary
artery calcium documents the presence of coronary artery disease,
the severity of this disease and any potential stenosis cannot be
assessed on this non-gated CT examination. Assessment for potential
risk factor modification, dietary therapy or pharmacologic therapy
may be warranted, if clinically indicated.
5. Additional incidental findings, as above.

## 2021-05-25 MED ORDER — LACTATED RINGERS IV BOLUS (SEPSIS)
500.0000 mL | Freq: Once | INTRAVENOUS | Status: AC
  Administered 2021-05-25: 500 mL via INTRAVENOUS

## 2021-05-25 MED ORDER — ACETAMINOPHEN 325 MG PO TABS: 650.0000 mg | ORAL_TABLET | Freq: Four times a day (QID) | ORAL | Status: DC | PRN

## 2021-05-25 MED ORDER — CHLORHEXIDINE GLUCONATE CLOTH 2 % EX PADS
6.0000 | MEDICATED_PAD | Freq: Every day | CUTANEOUS | Status: DC
  Administered 2021-05-25 – 2021-06-01 (×8): 6 via TOPICAL

## 2021-05-25 MED ORDER — OXYCODONE HCL 5 MG PO TABS
5.0000 mg | ORAL_TABLET | Freq: Four times a day (QID) | ORAL | Status: DC | PRN
  Administered 2021-05-25 – 2021-05-27 (×5): 5 mg via ORAL
  Filled 2021-05-25 (×5): qty 1

## 2021-05-25 MED ORDER — IPRATROPIUM-ALBUTEROL 0.5-2.5 (3) MG/3ML IN SOLN
3.0000 mL | Freq: Four times a day (QID) | RESPIRATORY_TRACT | Status: AC
  Administered 2021-05-25 – 2021-05-26 (×2): 3 mL via RESPIRATORY_TRACT
  Filled 2021-05-25 (×2): qty 3

## 2021-05-25 MED ORDER — UMECLIDINIUM BROMIDE 62.5 MCG/INH IN AEPB
1.0000 | INHALATION_SPRAY | Freq: Every day | RESPIRATORY_TRACT | Status: DC
  Administered 2021-05-26: 1 via RESPIRATORY_TRACT
  Filled 2021-05-25: qty 7

## 2021-05-25 MED ORDER — SODIUM CHLORIDE 0.9 % IV SOLN: INTRAVENOUS | Status: DC

## 2021-05-25 MED ORDER — DEXTROSE 50 % IV SOLN: 1.0000 | Freq: Once | INTRAVENOUS | Status: AC

## 2021-05-25 MED ORDER — MOMETASONE FURO-FORMOTEROL FUM 200-5 MCG/ACT IN AERO
2.0000 | INHALATION_SPRAY | Freq: Two times a day (BID) | RESPIRATORY_TRACT | Status: DC
  Administered 2021-05-26: 2 via RESPIRATORY_TRACT
  Filled 2021-05-25: qty 8.8

## 2021-05-25 MED ORDER — LACTATED RINGERS IV BOLUS (SEPSIS)
1000.0000 mL | Freq: Once | INTRAVENOUS | Status: AC
  Administered 2021-05-25: 1000 mL via INTRAVENOUS

## 2021-05-25 MED ORDER — LACTATED RINGERS IV SOLN: INTRAVENOUS | Status: DC

## 2021-05-25 MED ORDER — DEXTROSE 50 % IV SOLN
INTRAVENOUS | Status: AC
  Administered 2021-05-25: 50 mL via INTRAVENOUS
  Filled 2021-05-25: qty 50

## 2021-05-25 MED ORDER — DILTIAZEM HCL ER COATED BEADS 240 MG PO CP24
240.0000 mg | ORAL_CAPSULE | Freq: Every day | ORAL | Status: DC
  Administered 2021-05-26 – 2021-05-27 (×2): 240 mg via ORAL
  Filled 2021-05-25 (×2): qty 1

## 2021-05-25 MED ORDER — IOHEXOL 300 MG/ML  SOLN
100.0000 mL | Freq: Once | INTRAMUSCULAR | Status: AC | PRN
  Administered 2021-05-25: 100 mL via INTRAVENOUS

## 2021-05-25 MED ORDER — SODIUM CHLORIDE 0.9 % IV SOLN
500.0000 mg | INTRAVENOUS | Status: DC
  Administered 2021-05-25 – 2021-05-30 (×6): 500 mg via INTRAVENOUS
  Filled 2021-05-25 (×6): qty 500

## 2021-05-25 MED ORDER — IOHEXOL 300 MG/ML  SOLN
75.0000 mL | Freq: Once | INTRAMUSCULAR | Status: AC | PRN
  Administered 2021-05-25: 75 mL via INTRAVENOUS

## 2021-05-25 MED ORDER — SODIUM CHLORIDE 0.9 % IV SOLN: INTRAVENOUS | Status: AC

## 2021-05-25 MED ORDER — ONDANSETRON HCL 4 MG PO TABS: 4.0000 mg | ORAL_TABLET | Freq: Four times a day (QID) | ORAL | Status: DC | PRN

## 2021-05-25 MED ORDER — ACETAMINOPHEN 650 MG RE SUPP: 650.0000 mg | Freq: Four times a day (QID) | RECTAL | Status: DC | PRN

## 2021-05-25 MED ORDER — POTASSIUM CHLORIDE CRYS ER 20 MEQ PO TBCR
40.0000 meq | EXTENDED_RELEASE_TABLET | Freq: Once | ORAL | Status: AC
  Administered 2021-05-25: 40 meq via ORAL
  Filled 2021-05-25: qty 2

## 2021-05-25 MED ORDER — IPRATROPIUM-ALBUTEROL 0.5-2.5 (3) MG/3ML IN SOLN: 3.0000 mL | RESPIRATORY_TRACT | Status: DC | PRN

## 2021-05-25 MED ORDER — ACETAMINOPHEN 325 MG PO TABS
650.0000 mg | ORAL_TABLET | Freq: Once | ORAL | Status: AC
  Administered 2021-05-25: 650 mg via ORAL
  Filled 2021-05-25: qty 2

## 2021-05-25 MED ORDER — ALPRAZOLAM 0.5 MG PO TABS
0.5000 mg | ORAL_TABLET | Freq: Three times a day (TID) | ORAL | Status: DC | PRN
  Administered 2021-05-25 – 2021-05-27 (×3): 0.5 mg via ORAL
  Filled 2021-05-25 (×3): qty 1

## 2021-05-25 MED ORDER — SODIUM CHLORIDE 0.9 % IV SOLN
2.0000 g | INTRAVENOUS | Status: DC
Start: 2021-05-26 — End: 2021-05-25

## 2021-05-25 MED ORDER — SODIUM CHLORIDE 0.9 % IV SOLN
2.0000 g | INTRAVENOUS | Status: AC
  Administered 2021-05-25 – 2021-05-31 (×7): 2 g via INTRAVENOUS
  Filled 2021-05-25 (×7): qty 20

## 2021-05-25 MED ORDER — POLYETHYLENE GLYCOL 3350 17 G PO PACK: 17.0000 g | PACK | Freq: Every day | ORAL | Status: DC | PRN

## 2021-05-25 MED ORDER — SODIUM CHLORIDE 0.9 % IV BOLUS (SEPSIS)
1000.0000 mL | Freq: Once | INTRAVENOUS | Status: AC
  Administered 2021-05-25: 1000 mL via INTRAVENOUS

## 2021-05-25 MED ORDER — ONDANSETRON HCL 4 MG/2ML IJ SOLN: 4.0000 mg | Freq: Four times a day (QID) | INTRAMUSCULAR | Status: DC | PRN

## 2021-05-25 MED ORDER — LACTATED RINGERS IV BOLUS (SEPSIS): 1000.0000 mL | Freq: Once | INTRAVENOUS | Status: DC

## 2021-05-25 MED ORDER — SODIUM CHLORIDE 0.9 % IV SOLN: 500.0000 mg | INTRAVENOUS | Status: DC

## 2021-05-25 MED ORDER — ENOXAPARIN SODIUM 40 MG/0.4ML IJ SOSY
40.0000 mg | PREFILLED_SYRINGE | INTRAMUSCULAR | Status: DC
  Administered 2021-05-25 – 2021-05-31 (×7): 40 mg via SUBCUTANEOUS
  Filled 2021-05-25 (×7): qty 0.4

## 2021-05-25 MED ORDER — HYDROCORTISONE NA SUCCINATE PF 100 MG IJ SOLR
50.0000 mg | Freq: Three times a day (TID) | INTRAMUSCULAR | Status: DC
  Administered 2021-05-25 – 2021-05-26 (×3): 50 mg via INTRAVENOUS
  Filled 2021-05-25 (×3): qty 2

## 2021-05-25 NOTE — Progress Notes (Signed)
Call received from patient's daughter stating that her mother is not able to undergo treatment at this time. Reports that she is unable to get out of bed and is minimally responsive. Patient's daughter reports that PT, OT, RN and palliative care are all coming into the patient's home. All future appts for [facility] have been cancelled at the daughter's request.

## 2021-05-25 NOTE — ED Notes (Signed)
Date and time results received: 05/25/21 1147 (use smartphrase ".now" to insert current time)  Test: glucose Critical Value: 74  Name of Provider Notified: Reather Converse

## 2021-05-25 NOTE — ED Notes (Signed)
Nurse notified of low BS

## 2021-05-25 NOTE — Progress Notes (Signed)
Notified provider of need to order repeat lactic acid # 3, bedside RN aware.

## 2021-05-25 NOTE — Patient Instructions (Signed)
[place] will see the doctor regularly throughout treatment.  We will obtain blood work from you prior to every treatment and monitor your results to make sure it is safe to give your treatment. The doctor monitors your response to treatment by the way you are feeling, your blood work, and by obtaining scans periodically.  There will be wait times while you are here for treatment.  It will take about 30 minutes to 1 hour for your lab work to result.  Then there will be wait times while pharmacy mixes your medications.    Pembrolizumab Beryle Flock)  About This Drug Pembrolizumab is used to treat cancer. It is given in the vein (IV).  This drug will take 30 minutes to infuse.  Possible Side Effects  Tiredness  Fever  Nausea  Decreased appetite (decreased hunger)  Loose bowel movements (diarrhea)  Constipation (not able to move bowels)  Trouble breathing  Rash  Itching  Muscle and bone pain  Cough  Note: Each of the side effects above was reported in 20% or greater of patients treated with pembrolizumab. Not all possible side effects are included above.  Warnings and Precautions   This drug works with your immune system and can cause inflammation in any of your organs and tissues and can change how they work. This may put you at risk for developing serious medical problems which can very rarely be fatal.   Colitis (swelling or inflammation in the colon) - symptoms are loose bowel movements (diarrhea) stomach cramping, and sometimes blood in the stool   Changes in liver function. Your liver function will be checked as needed.   Changes in kidney function, which can very rarely be fatal. Your kidney function will be checked as needed.   Inflammation (swelling) of the lungs which can very rarely be fatal - you may have a dry cough or trouble breathing.   This drug may affect some of your hormone glands (especially the thyroid, adrenals,  pituitary and pancreas). Your hormone levels will be checked as needed.   Blood sugar levels may change and you may develop diabetes. If you already have diabetes, changes may need to be made to your diabetes medication.   Severe allergic skin reaction, which can very rarely be fatal. You may develop blisters on your skin that are filled with fluid or a severe red rash all over your body that may be painful.   Increased risk of organ rejection in patients who have received donor organs   Increased risk of complications in patients who will undergo a stem cell transplant after receiving pembrolizumab.   While you are getting this drug in your vein (IV), you may have a reaction to the drug. Your nurse will check you closely for these signs: fever or shaking chills, flushing, facial swelling, feeling dizzy, headache, trouble breathing, rash, itching, chest tightness, or chest pain. These reactions may occur after your infusion. If this happens, call 911 for emergency care.  Important Information This drug may be present in the saliva, tears, sweat, urine, stool, vomit, semen, and vaginal secretions. Talk to your doctor and/or your nurse about the necessary precautions to take during this time.  Treating Side Effects   Ask your doctor or nurse about medicines that are available to help stop or lessen constipation, diarrhea and/or nausea.   Drink plenty of fluids (a minimum of eight glasses per day is recommended).   If you are not able to  move your bowels, check with your doctor or nurse before you use any enemas, laxatives, or suppositories   To help with nausea and vomiting, eat small, frequent meals instead of three large meals a day. Choose foods and drinks that are at room temperature. Ask your nurse or doctor about other helpful tips and medicine that is available to help or stop lessen these symptoms.   If you get diarrhea, eat low-fiber foods that are high in protein and calories and  avoid foods that can irritate your digestive tracts or lead to cramping. Ask your nurse or doctor about medicine that can lessen or stop your diarrhea.   Manage tiredness by pacing your activities for the day. Be sure to include periods of rest between energy-draining activities   Keeping your pain under control is important to your wellbeing. Please tell your doctor or nurse if you are experiencing pain.   If you have diabetes, keep good control of your blood sugar level. Tell your nurse or your doctor if your glucose levels are higher or lower than normal   If you get a rash do not put anything on it unless your doctor or nurse says you may. Keep the area around the rash clean and dry. Ask your doctor for medicine if your rash bothers you.   Infusion reactions may happen for 24 hours after your infusion. If this happens, call 911 for emergency care.  Food and Drug Interactions   There are no known interactions of pembrolizumab with food.   There are no known interactions of pembrolizumab with other medications.   Tell your doctor and pharmacist about all the medicines and dietary supplements (vitamins, minerals, herbs and others) that you are taking at this time. The safety and use of dietary supplements and alternative agents are often not known. Using these might affect your cancer or interfere with your treatment. Until more is known, you should not use dietary supplements or alternative agents without your cancer doctor's help.   When to Call the Doctor Call your doctor or nurse if you have any of the following symptoms and/or any new or unusual symptoms:   Fever of 100.4 F (38 C) or higher   Chills   Wheezing or trouble breathing   Rash or itching   Feeling dizzy or lightheaded   Loose bowel movements (diarrhea) more than 4 times a day or diarrhea with weakness or lightheadedness, or diarrhea that is not controlled by medications   Nausea that stops you from eating or  drinking, and/or that is not relieved by prescribed medicines   Lasting loss of appetite or rapid weight loss of five pounds in a week   Fatigue that interferes with your daily activities   No bowel movement for 3 days or you feel uncomfortable   Extreme weakness that interferes with normal activities   Bad abdominal pain, especially in upper right area   Decreased urine   Unusual thirst or passing urine often   Rash that is not relieved by prescribed medicines   Flu-like symptoms: fever, headache, muscle and joint aches, and fatigue (low energy, feeling weak)   Signs of liver problems: dark urine, pale bowel movements, bad stomach pain, feeling very tired and weak, unusual itching, or yellowing of the eyes or skin   Signs of infusion reactions such as fever or shaking chills, flushing, facial swelling, feeling dizzy, headache, trouble breathing, rash, itching, chest tightness, or chest pain.   Reproduction Warnings   Pregnancy warning: This  drug may have harmful effects on the unborn baby. Women of childbearing potential should use effective methods of birth control during your cancer treatment and for at least 4 months after treatment. Let your doctor know right away if you think you may be pregnant   Breast feeding warning: It is not known if this drug passes into breast milk. It is recommended that women do not breastfeed during treatment and for 4 months after treatment.   Fertility warning: Human fertility studies have not been done with this drug. Talk with your doctor or nurse if you plan to have children.   SELF CARE ACTIVITIES WHILE ON CHEMOTHERAPY/IMMUNOTHERAPY:  Hydration Increase your fluid intake 48 hours prior to treatment and drink at least 8 to 12 cups (64 ounces) of water/decaffeinated beverages per day after treatment. You can still have your cup of coffee or soda but these beverages do not count as part of your 8 to 12 cups that you need to drink daily. No  alcohol intake.  Medications Continue taking your normal prescription medication as prescribed.  If you start any new herbal or new supplements please let us know first to make sure it is safe.  Mouth Care Have teeth cleaned professionally before starting treatment. Keep dentures and partial plates clean. Use soft toothbrush and do not use mouthwashes that contain alcohol. Biotene is a good mouthwash that is available at most pharmacies or may be ordered by calling [phone]. Use warm salt water gargles (1 teaspoon salt per 1 quart warm water) before and after meals and at bedtime. Or you may rinse with 2 tablespoons of three-percent hydrogen peroxide mixed in eight ounces of water. If you are still having problems with your mouth or sores in your mouth please call the clinic. If you need dental work, please let the doctor know before you go for your appointment so that we can coordinate the best possible time for you in regards to your chemo regimen. You need to also let your dentist know that you are actively taking chemo. We may need to do labs prior to your dental appointment.  Skin Care Always use sunscreen that has not expired and with SPF (Sun Protection Factor) of 50 or higher. Wear hats to protect your head from the sun. Remember to use sunscreen on your hands, ears, face, & feet.  Use good moisturizing lotions such as udder cream, eucerin, or even Vaseline. Some chemotherapies can cause dry skin, color changes in your skin and nails.    Avoid long, hot showers or baths. Use gentle, fragrance-free soaps and laundry detergent. Use moisturizers, preferably creams or ointments rather than lotions because the thicker consistency is better at preventing skin dehydration. Apply the cream or ointment within 15 minutes of showering. Reapply moisturizer at night, and moisturize your hands every time after you wash them.   Infection Prevention Please wash your hands for at least 30 seconds  using warm soapy water. Handwashing is the #1 way to prevent the spread of germs. Stay away from sick people or people who are getting over a cold. If you develop respiratory systems such as green/yellow mucus production or productive cough or persistent cough let us know and we will see if you need an antibiotic. It is a good idea to keep a pair of gloves on when going into grocery stores/[employer] to decrease your risk of coming into contact with germs on the carts, etc. Carry alcohol hand gel with you at all times and use  it frequently if out in public. If your temperature reaches 100.5 or higher please call the clinic and let us know.  If it is after hours or on the weekend please go to the ER if your temperature is over 100.4.  Please have your own personal thermometer at home to use.    Sex and bodily fluids If you are going to have sex, a condom must be used to protect the person that isn't taking immunotherapy. For a few days after treatment, immunotherapy can be excreted through your bodily fluids.  When using the toilet please close the lid and flush the toilet twice.  Do this for a few day after you have had immunotherapy.   Contraception It is not known for sure whether or not immunotherapy drugs can be passed on through semen or secretions from the vagina. Because of this some doctors advise people to use a barrier method if you have sex during treatment. This applies to vaginal, anal or oral sex.  Generally, doctors advise a barrier method only for the time you are actually having the treatment and for about a week after your treatment.  Advice like this can be worrying, but this does not mean that you have to avoid being intimate with your partner. You can still have close contact with your partner and continue to enjoy sex.  Animals If you have cats or birds we just ask that you not change the litter or change the cage.  Please have someone else do this for you while you are on  immunotherapy.   Food Safety During and After Cancer Treatment Food safety is important for people both during and after cancer treatment. Cancer and cancer treatments, such as chemotherapy, radiation therapy, and stem cell/bone marrow transplantation, often weaken the immune system. This makes it harder for your body to protect itself from foodborne illness, also called food poisoning. Foodborne illness is caused by eating food that contains harmful bacteria, parasites, or viruses.  Foods to avoid Some foods have a higher risk of becoming tainted with bacteria. These include: Unwashed fresh fruit and vegetables, especially leafy vegetables that can hide dirt and other contaminants Raw sprouts, such as alfalfa sprouts Raw or undercooked beef, especially ground beef, or other raw or undercooked meat and poultry Fatty, fried, or spicy foods immediately before or after treatment.  These can sit heavy on your stomach and make you feel nauseous. Raw or undercooked shellfish, such as oysters. Sushi and sashimi, which often contain raw fish.  Unpasteurized beverages, such as unpasteurized fruit juices, raw milk, raw yogurt, or cider Undercooked eggs, such as soft boiled, over easy, and poached; raw, unpasteurized eggs; or foods made with raw egg, such as homemade raw cookie dough and homemade mayonnaise  Simple steps for food safety  Shop smart. Do not buy food stored or displayed in an unclean area. Do not buy bruised or damaged fruits or vegetables. Do not buy cans that have cracks, dents, or bulges. Pick up foods that can spoil at the end of your shopping trip and store them in a cooler on the way home.  Prepare and clean up foods carefully. Rinse all fresh fruits and vegetables under running water, and dry them with a clean towel or paper towel. Clean the top of cans before opening them. After preparing food, wash your hands for 20 seconds with hot water and soap. Pay special attention to  areas between fingers and under nails. Clean your utensils and dishes with hot  water and soap. Disinfect your kitchen and cutting boards using 1 teaspoon of liquid, unscented bleach mixed into 1 quart of water.    Dispose of old food. Eat canned and packaged food before its expiration date (the "use by" or "best before" date). Consume refrigerated leftovers within 3 to 4 days. After that time, throw out the food. Even if the food does not smell or look spoiled, it still may be unsafe. Some bacteria, such as Listeria, can grow even on foods stored in the refrigerator if they are kept for too long.  Take precautions when eating out. At restaurants, avoid buffets and salad bars where food sits out for a long time and comes in contact with many people. Food can become contaminated when someone with a virus, often a norovirus, or another "bug" handles it. Put any leftover food in a "to-go" container yourself, rather than having the server do it. And, refrigerate leftovers as soon as you get home. Choose restaurants that are clean and that are willing to prepare your food as you order it cooked.    SYMPTOMS TO REPORT AS SOON AS POSSIBLE AFTER TREATMENT:  FEVER GREATER THAN 100.4 F  CHILLS WITH OR WITHOUT FEVER  NAUSEA AND VOMITING THAT IS NOT CONTROLLED WITH YOUR NAUSEA MEDICATION  UNUSUAL SHORTNESS OF BREATH  UNUSUAL BRUISING OR BLEEDING  TENDERNESS IN MOUTH AND THROAT WITH OR WITHOUT PRESENCE OF ULCERS  URINARY PROBLEMS  BOWEL PROBLEMS  UNUSUAL RASH     Wear comfortable clothing and clothing appropriate for easy access to any Portacath or PICC line. Let us know if there is anything that we can do to make your therapy better!   What to do if you need assistance after hours or on the weekends: CALL [phone].  HOLD on the line, do not hang up.  You will hear multiple messages but at the end you will be connected with a nurse triage line.  They will contact the doctor if  necessary.  Most of the time they will be able to assist you.  Do not call the hospital operator.    I have been informed and understand all of the instructions given to me and have received a copy. I have been instructed to call the clinic [phone] or my family physician as soon as possible for continued medical care, if indicated. I do not have any more questions at this time but understand that I may call the [place] or the Patient Navigator at [phone] during office hours should I have questions or need assistance in obtaining follow-up care.

## 2021-05-25 NOTE — H&P (Addendum)
History and Physical    Tammy Blair:[id number] DOB: 1951/11/29 DOA: 05/25/2021  PCP: Vivien Presto, MD   Patient coming from: Home   I have personally briefly reviewed patient's old medical records in [facility]  Chief Complaint: Low blood sugars, SOB, abdominal pain  HPI: Tammy Blair is a 70 y.o. 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 Patient was brought to the ED with multiple complaints which included decreased responsiveness.  History is obtained from patient's daughter Tammy Blair who is at bedside as at the time of y evaluation patient is altered and unable to answer questions appropriately. Daughter reports symptoms started 2 days ago, and patient has rapidly declined since then.  Has been sleeping more, confused, unable to get out of bed, with reported increasing difficulty breathing, onset of productive cough that started yesterday.  She also complained of lower abdominal pain.  Blood sugar was checked this morning and was 44, patient was brought to the ED.  ED Course: Temperature 100.8, heart rate 98-113, respiratory rate 17-26, lactic acid 1.7 >> 2.5 .  O2 sats 91 to 95% on 6 L.  WBC 8.9.  Blood glucose 38.  CT Abd/pelvis with contrast-without acute intra-abdominal findings, CT chest w contrast-shows new patchy infiltrate in the left upper and lower lobe which has a somewhat masslike appearance in the lower lobe but likely related to extensive pneumonia.  Also persistent right upper lobe mass slightly smaller than prior CT.  2.5 L sepsis fluid bolus given.  IV ceftriaxone and azithromycin started.  D50  50 mils given.  Hospitalist to admit  Review of Systems: Unable to ascertain due to altered mental status.  Past Medical History:  Diagnosis Date   Anxiety    Asthma    Cancer (HCC)    Chronic back pain    COPD (chronic obstructive pulmonary disease) (HCC)    on  home O2   Diabetes mellitus without complication (HCC)    HOH (hard of hearing)    Hypertension    Lumbar radiculopathy     Past Surgical History:  Procedure Laterality Date   CESAREAN SECTION     FINE NEEDLE ASPIRATION  04/13/2021   Procedure: FINE NEEDLE ASPIRATION (FNA) LINEAR;  Surgeon: Josephine Igo, DO;  Location: [facility] END[facility];  Service: Pulmonary;;   TONSILLECTOMY     VIDEO BRONCHOSCOPY WITH ENDOBRONCHIAL ULTRASOUND Bilateral 04/13/2021   Procedure: VIDEO BRONCHOSCOPY WITH ENDOBRONCHIAL ULTRASOUND;  Surgeon: Josephine Igo, DO;  Location: [facility] ENDOSCOPY;  Service: Pulmonary;  Laterality: Bilateral;     reports that she quit smoking about 6 weeks ago. Her smoking use included cigarettes. She has a 20.00 pack-year smoking history. She has never used smokeless tobacco. She reports previous drug use. She reports that she does not drink alcohol.  No Known Allergies  Family History  Problem Relation Age of Onset   COPD Mother    Cancer Mother        kidney   Stroke Brother    Asthma Sister    Prior to Admission medications   Medication Sig Start Date End Date Taking? Authorizing Provider  atorvastatin (LIPITOR) 40 MG tablet Take 1 tablet (40 mg total) by mouth daily. 04/14/21  Yes Almon Hercules, MD  blood glucose meter kit and supplies Dispense based on patient and insurance preference. Use up to 3 times daily as directed. (FOR ICD-10 E10.9, E11.9). 04/14/21  Yes Mercy Riding, MD  Budeson-Glycopyrrol-Formoterol (BREZTRI AEROSPHERE) 160-9-4.8 MCG/ACT AERO Inhale 2 puffs into the lungs in the morning and at bedtime. 04/30/21  Yes Icard, Bradley L, DO  budesonide-formoterol (SYMBICORT) 160-4.5 MCG/ACT inhaler Inhale 1 puff into the lungs 2 (two) times daily. 04/14/21  Yes Mercy Riding, MD  chlorthalidone (HYGROTON) 25 MG tablet Take 25 mg by mouth daily. With food 12/10/20  Yes [provider]  dexamethasone (DECADRON) 4 MG tablet Take 1 tablet (4 mg total) by mouth 3 (three)  times daily. Patient taking differently: Take 2 mg by mouth 2 (two) times daily. 04/14/21  Yes Mercy Riding, MD  diltiazem (CARDIZEM CD) 240 MG 24 hr capsule Take 1 capsule (240 mg total) by mouth daily. 04/14/21  Yes Mercy Riding, MD  famotidine (PEPCID) 20 MG tablet Take 20 mg by mouth daily as needed. 05/12/21  Yes [provider]  gabapentin (NEURONTIN) 300 MG capsule Take 1 capsule (300 mg total) by mouth 3 (three) times daily. 04/14/21  Yes Mercy Riding, MD  insulin isophane & regular human (NOVOLIN 70/30 FLEXPEN RELION) (70-30) 100 UNIT/ML KwikPen Inject 30 units subcutaneously twice a day before breakfast and dinner 04/14/21  Yes Gonfa, Taye T, MD  Insulin Pen Needle (PEN NEEDLES) 31G X 6 MM MISC 1 pen by Does not apply route in the morning and at bedtime. 04/14/21  Yes Mercy Riding, MD  metFORMIN (GLUCOPHAGE-XR) 500 MG 24 hr tablet Take 1 tablet (500 mg total) by mouth in the morning and at bedtime. 04/14/21  Yes Mercy Riding, MD  ondansetron (ZOFRAN) 4 MG tablet Take 1 tablet (4 mg total) by mouth daily as needed for nausea or vomiting. 04/14/21 04/14/22 Yes Gonfa, Charlesetta Ivory, MD  oxyCODONE (OXY IR/ROXICODONE) 5 MG immediate release tablet TAKE (1) TABLET BY MOUTH EVERY 6 HOURS AS NEEDED FOR 14 DAYS. 04/27/21  Yes Bruning, Ashlyn, PA-C  umeclidinium bromide (INCRUSE ELLIPTA) 62.5 MCG/INH AEPB Inhale 1 puff into the lungs daily. 04/19/21  Yes Mercy Riding, MD  albuterol (PROVENTIL) (2.5 MG/3ML) 0.083% nebulizer solution Take 3 mLs (2.5 mg total) by nebulization every 4 (four) hours as needed for wheezing or shortness of breath. Patient not taking: No sig reported 04/14/21   Mercy Riding, MD  albuterol (VENTOLIN HFA) 108 (90 Base) MCG/ACT inhaler Inhale 1-2 puffs into the lungs every 4 (four) hours as needed for wheezing or shortness of breath. Patient not taking: No sig reported 04/14/21   Mercy Riding, MD  ALPRAZolam (XANAX) 0.5 MG tablet TAKE 1 TABLET BY MOUTH THREE TIMES DAILY AS NEEDED Patient not  taking: No sig reported 04/27/21   Bruning, Ashlyn, PA-C  amoxicillin-clavulanate (AUGMENTIN) 875-125 MG tablet Take 1 tablet by mouth every 12 (twelve) hours. Patient not taking: Reported on 05/25/2021 04/14/21   Mercy Riding, MD  aspirin EC 81 MG EC tablet Take 1 tablet (81 mg total) by mouth daily. Swallow whole. Patient not taking: Reported on 05/25/2021 04/15/21   Mercy Riding, MD  cyclobenzaprine (FLEXERIL) 10 MG tablet Take 0.5 tablets (5 mg total) by mouth 3 (three) times daily as needed for muscle spasms. For muscle spasm Patient not taking: Reported on 05/25/2021 10/12/17   Kathie Dike, MD  nicotine (NICODERM CQ - DOSED IN MG/24 HOURS) 21 mg/24hr patch Place 1 patch (21 mg total) onto the skin daily. Patient not taking: Reported on 05/25/2021 04/14/21   Mercy Riding, MD  nystatin (MYCOSTATIN) 100000 UNIT/ML suspension  Take 5 mLs (500,000 Units total) by mouth 4 (four) times daily. Patient not taking: Reported on 05/25/2021 04/22/21   Tyler Pita, MD    Physical Exam: Vitals:   05/25/21 1355 05/25/21 1400 05/25/21 1430 05/25/21 1500  BP: (!) 122/59 111/61 (!) 127/57 136/62  Pulse: (!) 113 (!) 108 100 (!) 106  Resp: (!) 26 (!) 24 (!) 24 17  Temp:      TempSrc:      SpO2: 96% 97%  94%  Weight:      Height:        Constitutional: Chronically ill-appearing, with sparse hair, awake, but lethargic appearing, Vitals:   05/25/21 1355 05/25/21 1400 05/25/21 1430 05/25/21 1500  BP: (!) 122/59 111/61 (!) 127/57 136/62  Pulse: (!) 113 (!) 108 100 (!) 106  Resp: (!) 26 (!) 24 (!) 24 17  Temp:      TempSrc:      SpO2: 96% 97%  94%  Weight:      Height:       Eyes: PERRL, lids and conjunctivae normal ENMT: Mucous membranes are dry.  Neck: normal, supple, no masses, no thyromegaly Respiratory: O2 sats on my evaluation on home 2.5 L 89%, anterior auscultation, chest sounds congested, with upper airway transmitted sounds, no wheezing, no crackles.  Mild increased work of breathing, no  accessory muscle use.  Cardiovascular: Regular rate and rhythm, no murmurs / rubs / gallops. No extremity edema. 2+ pedal pulses.   Abdomen: no tenderness, no masses palpated. No hepatosplenomegaly. Bowel sounds positive.  Musculoskeletal: no clubbing / cyanosis. No joint deformity upper and lower extremities. Good ROM, no contractures. Normal muscle tone.  Skin: no rashes, lesions, ulcers. No induration Neurologic: Not following directions, moving upper extremities spontaneously, no facial asymmetry. psychiatric: Unable to assess due to altered mental status.  Awake, lethargic, drowsy, not answering questions, occasionally Muttering  Labs on Admission: I have personally reviewed following labs and imaging studies  CBC: Recent Labs  Lab 05/25/21 1107  WBC 8.9  NEUTROABS 5.7  HGB 12.2  HCT 36.2  MCV 94.3  PLT 416*   Basic Metabolic Panel: Recent Labs  Lab 05/25/21 1107  NA 132*  K 3.2*  CL 84*  CO2 36*  GLUCOSE 38*  BUN 26*  CREATININE 0.48  CALCIUM 8.6*   Liver Function Tests: Recent Labs  Lab 05/25/21 1107  AST 50*  ALT 49*  ALKPHOS 73  BILITOT 1.7*  PROT 6.8  ALBUMIN 2.9*   Recent Labs  Lab 05/25/21 1107  LIPASE 17   No results for input(s): AMMONIA in the last 168 hours. Coagulation Profile: Recent Labs  Lab 05/25/21 1107  INR 1.1   CBG: Recent Labs  Lab 05/25/21 1051 05/25/21 1052 05/25/21 1135 05/25/21 1528  GLUCAP 34* 25* 121* 79   Urine analysis:    Component Value Date/Time   COLORURINE YELLOW 05/25/2021 1403   APPEARANCEUR HAZY (A) 05/25/2021 1403   LABSPEC 1.013 05/25/2021 1403   PHURINE 6.0 05/25/2021 1403   GLUCOSEU 150 (A) 05/25/2021 1403   HGBUR MODERATE (A) 05/25/2021 1403   BILIRUBINUR NEGATIVE 05/25/2021 1403   KETONESUR NEGATIVE 05/25/2021 1403   PROTEINUR NEGATIVE 05/25/2021 1403   UROBILINOGEN 0.2 07/17/2014 1613   NITRITE NEGATIVE 05/25/2021 1403   LEUKOCYTESUR NEGATIVE 05/25/2021 1403    Radiological Exams on  Admission: CT Chest W Contrast  Result Date: 05/25/2021 CLINICAL DATA:  Known history of lung carcinoma with metastatic disease and abnormal chest x-ray, initial encounter EXAM: CT CHEST  WITH CONTRAST TECHNIQUE: Multidetector CT imaging of the chest was performed during intravenous contrast administration. CONTRAST:  65mL OMNIPAQUE IOHEXOL 300 MG/ML  SOLN COMPARISON:  Chest x-ray from earlier in the same day, CT from 04/08/2021 FINDINGS: Cardiovascular: Atherosclerotic calcifications of the thoracic aorta are noted. No aneurysmal dilatation or dissection is noted. Heart is at the upper limits of normal in size. Mild coronary calcifications are noted. No definitive emboli are seen although timing was not performed for embolus evaluation. Mediastinum/Nodes: Thoracic inlet is within normal limits. Scattered mediastinal adenopathy is noted most prominent in the precarinal region measuring up to 2.8 cm slightly enlarged from the prior exam. Noted along the aortic arch measures 15 mm in short axis also slightly enlarged when compared with the prior exam. Bilateral hilar adenopathy is noted with a dominant 2.5 cm node stable in appearance in the right hilum. The esophagus as visualized is within normal limits. Right axillary adenopathy is noted slightly more prominent than that seen on the prior exam. Left-sided subpectoral adenopathy is noted. Lungs/Pleura: Lungs are well aerated bilaterally. The previously seen mass lesion along the major fissure is again identified with slightly reduced in size measuring approximately 2.7 x 2.5 cm. It previously measured up to 3.7 cm. Patchy infiltrative density is noted in the right lower lobe somewhat limited in evaluation due to patient motion artifact. Patchy infiltrate is seen in the left upper lobe along the fissure as well as in the left lower lobe also new from the prior exam. No sizable effusion is seen. Upper Abdomen: Mild nodularity to the liver is noted similar to that  seen on prior CT. No other focal abnormality in the upper abdomen is noted. Musculoskeletal: Degenerative changes of the thoracic spine are seen. No acute bony abnormality is noted. IMPRESSION: Persistent right upper lobe mass although slightly smaller than that seen on the prior CT. Mediastinal adenopathy which is slightly more prominent than that seen on the prior exam. Bilateral hilar adenopathy is noted as well. Subpectoral adenopathy in right axillary adenopathy is noted in slightly more prominent than that noted on the prior exam. New patchy infiltrate in the left upper and lower lobe which has a somewhat masslike appearance in the lower lobe but likely related to extensive pneumonia given the relative abrupt onset. Cirrhotic changes of the liver similar to that seen on prior exam. Aortic Atherosclerosis (ICD10-I70.0). Electronically Signed   By: Inez Catalina M.D.   On: 05/25/2021 15:11   CT ABDOMEN PELVIS W CONTRAST  Result Date: 05/25/2021 CLINICAL DATA:  70 year old female with history of abdominal pain and fever. History of lung cancer with metastatic disease to the brain, status post radiation therapy in May 2022. EXAM: CT ABDOMEN AND PELVIS WITH CONTRAST TECHNIQUE: Multidetector CT imaging of the abdomen and pelvis was performed using the standard protocol following bolus administration of intravenous contrast. CONTRAST:  119mL OMNIPAQUE IOHEXOL 300 MG/ML  SOLN COMPARISON:  CT the chest, abdomen and pelvis 04/08/2021. FINDINGS: Lower chest: There is soft tissue prominence in a peribronchovascular distribution in the lung bases bilaterally (left greater than right). In addition, widespread areas of airspace consolidation are noted in the lung bases bilaterally, most severe in the left lower lobe and inferior segment of the lingula. Atherosclerotic calcifications in the descending thoracic aorta as well as the right coronary artery. Hepatobiliary: Diffuse low attenuation throughout the hepatic  parenchyma, indicative of a background of hepatic steatosis. Liver also has a shrunken appearance and nodular contour, indicative of underlying cirrhosis. No suspicious  cystic or solid hepatic lesions. No intra or extrahepatic biliary ductal dilatation. Gallbladder is normal in appearance. Pancreas: No pancreatic mass. No pancreatic ductal dilatation. No pancreatic or peripancreatic fluid collections or inflammatory changes. Spleen: Unremarkable. Adrenals/Urinary Tract: Bilateral kidneys and adrenal glands are normal in appearance. No hydroureteronephrosis. Urinary bladder is normal in appearance. Stomach/Bowel: The appearance of the stomach is normal. There is no pathologic dilatation of small bowel or colon. Normal appendix. Vascular/Lymphatic: Aortic atherosclerosis, without evidence of aneurysm or dissection in the abdominal or pelvic vasculature. There is some fusiform ectasia of the distal infrarenal abdominal aorta which measures up to 2.5 x 2.2 cm in diameter shortly before the aortic bifurcation. No lymphadenopathy noted in the abdomen or pelvis. Reproductive: Uterus and ovaries are atrophic. Other: No significant volume of ascites.  No pneumoperitoneum. Musculoskeletal: There are no aggressive appearing lytic or blastic lesions noted in the visualized portions of the skeleton. IMPRESSION: 1. There are no acute findings noted in the abdomen or pelvis to account for the patient's symptoms. 2. However, the visualized portions of the lung bases are very abnormal. Although much of these findings may relate to multilobar pneumonia, the profound soft tissue thickening in a peribronchovascular distribution in the lower lungs is very unusual in appearance and raises concern for potential lymphangitic spread of tumor. Further evaluation with contrast enhanced chest CT is recommended at this time to better evaluate the full extent of pulmonary disease. 3. Hepatic steatosis with hepatic cirrhosis. 4. Aortic  atherosclerosis, in addition to at least right coronary artery disease. Please note that although the presence of coronary artery calcium documents the presence of coronary artery disease, the severity of this disease and any potential stenosis cannot be assessed on this non-gated CT examination. Assessment for potential risk factor modification, dietary therapy or pharmacologic therapy may be warranted, if clinically indicated. 5. Additional incidental findings, as above. Electronically Signed   By: Vinnie Langton M.D.   On: 05/25/2021 13:59   DG Chest Port 1 View  Result Date: 05/25/2021 CLINICAL DATA:  Shortness of breath. EXAM: PORTABLE CHEST 1 VIEW COMPARISON:  04/07/2021 FINDINGS: Stable heart size. Redemonstrated right upper lobe lung mass measuring approximately 3.8 cm in diameter, stable to minimally progressed in size from prior. New patchy consolidation within the left mid to lower lung field. No pleural effusion or pneumothorax. IMPRESSION: 1. New patchy consolidation within the left mid to lower lung field concerning for pneumonia. 2. Stable to minimally progressed size of known right upper lobe lung mass. Electronically Signed   By: Davina Poke D.O.   On: 05/25/2021 11:04    EKG: Independently reviewed.  Sinus tachycardia rate 104.  QTc 483.  No significant change from prior.  Assessment/Plan Principal Problem:   Sepsis ([place]) Active Problems:   Acute metabolic encephalopathy   Hypoglycemia   Acute on chronic respiratory failure with hypoxia (HCC)   Essential hypertension   Diabetes mellitus ([place])   Right lower lobe lung mass   Metastatic cancer to brain ([place])   Severe Sepsis secondary to pneumonia-tachycardic to 113, febrile to 100.8, tachypneic to 26, with evidence of endorgan dysfunction lactic acidosis of 2.5 and acute on chronic respiratory failure.  CT chest shows left upper and lower lobe patchy infiltrates.  COVID test negative -IV ceftriaxone and azithromycin  started in ED -Follow-up blood and urine cultures -Trend lactic acid -2.5 L bolus given, continue hydration with N/s  100cc/hr x 15hrs -Obtain ABG -Patient is on chronic steroids, will start stress dose steroids  with hydrocortisone 50mg  3 times daily - Urine Legionella and strep  Acute metabolic encephalopathy-likely secondary to sepsis.  History of metastatic brain cancer. -Obtain head CT - mental status improved,patient requesting her home benzos and narcotics- resumed.   Hypoglycemia , controlled diabetes mellitus-blood glucose down to 25 in the ED > 121> 79 >111  status post 50 mils D50.  Likely from sepsis and home insulins 70/30  30 units twice daily. A1c- 6.3. -Monitor glucose closely -Start dextrose containing fluid -Hold home insulin, metformin - If becomes elevated while on stress steroids, will need SSI.  Acute on chronic respiratory failure-likely from pneumonia, O2 sats on my evaluation down to 89% on home 2-1/2 L, currently on 6 L maintaining sats greater than 93%.  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 Alvy Bimler.  Completed palliative radiation therapy to the brain.  Per last visit, patient is profoundly cushingoid and had classical signs of steroid-induced myopathy.  Steroid taper was started. -Palliative care consult -Hold steroid taper for now, and start stress dose steroids with hydrocortisone  Hypertension-stable. - Hold chlortalidone, while hydrating - Resume Cardizem  COPD- Stable - PRN and scheduled duonebs - Resume home bronchodils  DVT prophylaxis: Lovenox Code Status: DNR, confirmed with patient's daughter Tammy Blair at bedside. Family Communication: Daughter Tammy Blair patient's son primary decision makers. Disposition Plan  > 2 days Consults called: Palliative Care Admission status: inpt, step down I certify that at the point of admission it is my clinical judgment that the patient will require  inpatient hospital care spanning beyond 2 midnights from the point of admission due to high intensity of service, high risk for further deterioration and high frequency of surveillance required.   Bethena Roys MD [facility]  05/25/2021, 8:59 PM

## 2021-05-25 NOTE — Progress Notes (Signed)
Gave patient breathing treatment.  Patient started having a coughing spell, desat into 80s.  Patient was already on 6L [place] so I placed patient on a [place] HFNC that will give patient moisture and allow RN to titrate O2 as needed.  Will continue to monitor.

## 2021-05-25 NOTE — Progress Notes (Signed)
Elink following for code sepsis 

## 2021-05-25 NOTE — ED Notes (Signed)
Patient repeatedly removing oxygen. Bilateral mittens applied.

## 2021-05-25 NOTE — Progress Notes (Signed)
Notified provider of need to order repeat lactic acid. (#3)

## 2021-05-25 NOTE — Telephone Encounter (Signed)
Provided update to hospital liaisons and Palliative NP that patient currently admitted to hospital

## 2021-05-25 NOTE — Progress Notes (Signed)
Notified bedside nurse of need to administer fluid bolus.  

## 2021-05-25 NOTE — ED Notes (Signed)
Date and time results received: 05/25/21 1209   Test: po2 Critical Value: less than 31  Name of Provider Notified: Chrys Racer

## 2021-05-25 NOTE — ED Provider Notes (Signed)
[facility] EMERGENCY DEPARTMENT Provider Note   CSN: [id number] Arrival date & time: 05/25/21  1016     History Chief Complaint  Patient presents with   Shortness of Breath    Tammy Blair is a 70 y.o. 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 Per patient's sister, Tammy Blair, they checked her glucose earlier today which was 62 and was instructed by her nurse to report to the ER for further evaluation. Tammy Blair notes that patient has been more confused over the past few days. Last night, she was complaining of severe abdominal pain. Tammy Blair notes that patient urinated and had multiple bowel movements in bed which is atypical for patient. Sister states that patient has been diaphoretic, but no known fever. Patient is chronically on 2.5 L of oxygen at home. During my initial evaluation, patient really only able to tell me she is having abdominal pain. She is oriented x4, but hard to obtain HPI.   Level 5 caveat secondary to altered mental status.  History obtained from patient, sister, and past medical records. No interpreter used during encounter.       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   Right lower lobe lung mass     Metastatic cancer to brain ([place])    SOB (shortness of breath)    CVA (cerebral vascular accident) ([place]) 04/07/2021   Fever 09/17/2018   Type 2 diabetes mellitus without complication ([place]) 95/18/8416   COPD (chronic obstructive pulmonary disease) ([place]) 60/63/0160   Acute metabolic encephalopathy 10/93/2355   Acute and chronic respiratory failure with hypercapnia (HCC)    Sepsis secondary to UTI ([place]) 10/07/2017   Tachycardia 10/07/2017   Cocaine abuse ([place]) 10/07/2017   Acute respiratory failure with hypercapnia (HCC) 08/11/2016   Diabetes mellitus ([place]) 08/11/2016   HOH (hard of hearing) 08/22/2013   Hyperglycemia 08/21/2013   COPD exacerbation ([place]) 02/18/2013   GAD (generalized anxiety disorder) 02/18/2013   Chronic pain syndrome 02/18/2013   Tobacco use disorder 02/18/2013   Chronic respiratory failure ([place]) 02/18/2013   Obesity, unspecified 02/18/2013   Essential hypertension 04/29/2009   COPD UNSPECIFIED 04/29/2009   WEIGHT GAIN, ABNORMAL 04/29/2009    Past Surgical History:  Procedure Laterality Date   CESAREAN SECTION     FINE NEEDLE ASPIRATION  04/13/2021   Procedure: FINE NEEDLE ASPIRATION (FNA) LINEAR;  Surgeon: Garner Nash, DO;  Location: [facility];  Service: Pulmonary;;   TONSILLECTOMY     VIDEO BRONCHOSCOPY WITH ENDOBRONCHIAL ULTRASOUND Bilateral 04/13/2021   Procedure: VIDEO BRONCHOSCOPY WITH ENDOBRONCHIAL ULTRASOUND;  Surgeon: Garner Nash, DO;  Location: [facility];  Service: Pulmonary;  Laterality: Bilateral;  OB History   No obstetric history on file.     Family History  Problem Relation Age of Onset   COPD Mother    Cancer Mother        kidney   Stroke Brother    Asthma Sister     Social History   Tobacco Use   Smoking status: Former    Packs/day: 0.50    Years: 40.00    Pack years: 20.00    Types: Cigarettes    Quit date: 04/11/2021    Years since quitting: 0.1   Smokeless tobacco: Never  Vaping Use   Vaping Use: Never used   Substance Use Topics   Alcohol use: No   Drug use: Not Currently    Home Medications Prior to Admission medications   Medication Sig Start Date End Date Taking? Authorizing Provider  atorvastatin (LIPITOR) 40 MG tablet Take 1 tablet (40 mg total) by mouth daily. 04/14/21  Yes Mercy Riding, MD  blood glucose meter kit and supplies Dispense based on patient and insurance preference. Use up to 3 times daily as directed. (FOR ICD-10 E10.9, E11.9). 04/14/21  Yes Mercy Riding, MD  Budeson-Glycopyrrol-Formoterol (BREZTRI AEROSPHERE) 160-9-4.8 MCG/ACT AERO Inhale 2 puffs into the lungs in the morning and at bedtime. 04/30/21  Yes Icard, Bradley L, DO  budesonide-formoterol (SYMBICORT) 160-4.5 MCG/ACT inhaler Inhale 1 puff into the lungs 2 (two) times daily. 04/14/21  Yes Mercy Riding, MD  chlorthalidone (HYGROTON) 25 MG tablet Take 25 mg by mouth daily. With food 12/10/20  Yes [provider]  dexamethasone (DECADRON) 4 MG tablet Take 1 tablet (4 mg total) by mouth 3 (three) times daily. Patient taking differently: Take 2 mg by mouth 2 (two) times daily. 04/14/21  Yes Mercy Riding, MD  diltiazem (CARDIZEM CD) 240 MG 24 hr capsule Take 1 capsule (240 mg total) by mouth daily. 04/14/21  Yes Mercy Riding, MD  famotidine (PEPCID) 20 MG tablet Take 20 mg by mouth daily as needed. 05/12/21  Yes [provider]  gabapentin (NEURONTIN) 300 MG capsule Take 1 capsule (300 mg total) by mouth 3 (three) times daily. 04/14/21  Yes Mercy Riding, MD  insulin isophane & regular human (NOVOLIN 70/30 FLEXPEN RELION) (70-30) 100 UNIT/ML KwikPen Inject 30 units subcutaneously twice a day before breakfast and dinner 04/14/21  Yes Gonfa, Taye T, MD  Insulin Pen Needle (PEN NEEDLES) 31G X 6 MM MISC 1 pen by Does not apply route in the morning and at bedtime. 04/14/21  Yes Mercy Riding, MD  metFORMIN (GLUCOPHAGE-XR) 500 MG 24 hr tablet Take 1 tablet (500 mg total) by mouth in the morning and at bedtime. 04/14/21  Yes  Mercy Riding, MD  ondansetron (ZOFRAN) 4 MG tablet Take 1 tablet (4 mg total) by mouth daily as needed for nausea or vomiting. 04/14/21 04/14/22 Yes Gonfa, Charlesetta Ivory, MD  oxyCODONE (OXY IR/ROXICODONE) 5 MG immediate release tablet TAKE (1) TABLET BY MOUTH EVERY 6 HOURS AS NEEDED FOR 14 DAYS. 04/27/21  Yes Bruning, Ashlyn, PA-C  umeclidinium bromide (INCRUSE ELLIPTA) 62.5 MCG/INH AEPB Inhale 1 puff into the lungs daily. 04/19/21  Yes Mercy Riding, MD  albuterol (PROVENTIL) (2.5 MG/3ML) 0.083% nebulizer solution Take 3 mLs (2.5 mg total) by nebulization every 4 (four) hours as needed for wheezing or shortness of breath. Patient not taking: No sig reported 04/14/21   Mercy Riding, MD  albuterol (VENTOLIN HFA) 108 (90 Base) MCG/ACT inhaler Inhale 1-2  puffs into the lungs every 4 (four) hours as needed for wheezing or shortness of breath. Patient not taking: No sig reported 04/14/21   Almon Hercules, MD  ALPRAZolam (XANAX) 0.5 MG tablet TAKE 1 TABLET BY MOUTH THREE TIMES DAILY AS NEEDED Patient not taking: No sig reported 04/27/21   Bruning, Ashlyn, PA-C  amoxicillin-clavulanate (AUGMENTIN) 875-125 MG tablet Take 1 tablet by mouth every 12 (twelve) hours. Patient not taking: Reported on 05/25/2021 04/14/21   Almon Hercules, MD  aspirin EC 81 MG EC tablet Take 1 tablet (81 mg total) by mouth daily. Swallow whole. Patient not taking: Reported on 05/25/2021 04/15/21   Almon Hercules, MD  cyclobenzaprine (FLEXERIL) 10 MG tablet Take 0.5 tablets (5 mg total) by mouth 3 (three) times daily as needed for muscle spasms. For muscle spasm Patient not taking: Reported on 05/25/2021 10/12/17   Erick Blinks, MD  nicotine (NICODERM CQ - DOSED IN MG/24 HOURS) 21 mg/24hr patch Place 1 patch (21 mg total) onto the skin daily. Patient not taking: Reported on 05/25/2021 04/14/21   Almon Hercules, MD  nystatin (MYCOSTATIN) 100000 UNIT/ML suspension Take 5 mLs (500,000 Units total) by mouth 4 (four) times daily. Patient not taking: Reported on  05/25/2021 04/22/21   Margaretmary Dys, MD    Allergies    Patient has no known allergies.  Review of Systems   Review of Systems  Unable to perform ROS: Mental status change  Gastrointestinal:  Positive for abdominal pain.   Physical Exam Updated Vital Signs BP 136/62   Pulse (!) 106   Temp (!) 100.8 F (38.2 C)   Resp 17   Ht 5\' 8"  (1.727 m)   Wt 76.7 kg   SpO2 94%   BMI 25.70 kg/m   Physical Exam Vitals and nursing note reviewed.  Constitutional:      General: She is in acute distress.     Appearance: She is ill-appearing.  HENT:     Head: Normocephalic.  Eyes:     Pupils: Pupils are equal, round, and reactive to light.  Cardiovascular:     Rate and Rhythm: Regular rhythm. Tachycardia present.     Pulses: Normal pulses.     Heart sounds: Normal heart sounds. No murmur heard.   No friction rub. No gallop.  Pulmonary:     Breath sounds: Wheezing, rhonchi and rales present.     Comments: 6L [place] Abdominal:     General: Abdomen is flat. There is no distension.     Palpations: Abdomen is soft.     Tenderness: There is abdominal tenderness. There is no guarding or rebound.     Comments: Diffuse abdominal tenderness.  Musculoskeletal:        General: Normal range of motion.     Cervical back: Neck supple.     Comments: 1+ pitting edema bilaterally.  Skin:    General: Skin is warm and dry.  Neurological:     Mental Status: She is alert.     Comments: Unable to follow commands. Oriented x4.   Psychiatric:        Mood and Affect: Mood normal.        Behavior: Behavior normal.    ED Results / Procedures / Treatments   Labs (all labs ordered are listed, but only abnormal results are displayed) Labs Reviewed  LACTIC ACID, PLASMA - Abnormal; Notable for the following components:      Result Value   Lactic Acid, Venous 2.5 (*)  All other components within normal limits  COMPREHENSIVE METABOLIC PANEL - Abnormal; Notable for the following components:   Sodium 132  (*)    Potassium 3.2 (*)    Chloride 84 (*)    CO2 36 (*)    Glucose, Bld 38 (*)    BUN 26 (*)    Calcium 8.6 (*)    Albumin 2.9 (*)    AST 50 (*)    ALT 49 (*)    Total Bilirubin 1.7 (*)    All other components within normal limits  CBC WITH DIFFERENTIAL/PLATELET - Abnormal; Notable for the following components:   RBC 3.84 (*)    Platelets 118 (*)    nRBC 0.5 (*)    All other components within normal limits  URINALYSIS, ROUTINE W REFLEX MICROSCOPIC - Abnormal; Notable for the following components:   APPearance HAZY (*)    Glucose, UA 150 (*)    Hgb urine dipstick MODERATE (*)    Bacteria, UA RARE (*)    All other components within normal limits  BLOOD GAS, VENOUS - Abnormal; Notable for the following components:   pCO2, Ven 67.6 (*)    pO2, Ven <31.0 (*)    Bicarbonate 33.1 (*)    Acid-Base Excess 12.2 (*)    All other components within normal limits  CBG MONITORING, ED - Abnormal; Notable for the following components:   Glucose-Capillary 34 (*)    All other components within normal limits  CBG MONITORING, ED - Abnormal; Notable for the following components:   Glucose-Capillary 25 (*)    All other components within normal limits  CBG MONITORING, ED - Abnormal; Notable for the following components:   Glucose-Capillary 121 (*)    All other components within normal limits  TROPONIN I (HIGH SENSITIVITY) - Abnormal; Notable for the following components:   Troponin I (High Sensitivity) 25 (*)    All other components within normal limits  CULTURE, BLOOD (SINGLE)  RESP PANEL BY RT-PCR (FLU A&B, COVID) ARPGX2  CULTURE, BLOOD (SINGLE)  URINE CULTURE  LACTIC ACID, PLASMA  PROTIME-INR  APTT  LIPASE, BLOOD  BRAIN NATRIURETIC PEPTIDE  LACTIC ACID, PLASMA  LACTIC ACID, PLASMA  CBG MONITORING, ED  TROPONIN I (HIGH SENSITIVITY)    EKG None  Radiology CT Chest W Contrast  Result Date: 05/25/2021 CLINICAL DATA:  Known history of lung carcinoma with metastatic disease and  abnormal chest x-ray, initial encounter EXAM: CT CHEST WITH CONTRAST TECHNIQUE: Multidetector CT imaging of the chest was performed during intravenous contrast administration. CONTRAST:  10mL OMNIPAQUE IOHEXOL 300 MG/ML  SOLN COMPARISON:  Chest x-ray from earlier in the same day, CT from 04/08/2021 FINDINGS: Cardiovascular: Atherosclerotic calcifications of the thoracic aorta are noted. No aneurysmal dilatation or dissection is noted. Heart is at the upper limits of normal in size. Mild coronary calcifications are noted. No definitive emboli are seen although timing was not performed for embolus evaluation. Mediastinum/Nodes: Thoracic inlet is within normal limits. Scattered mediastinal adenopathy is noted most prominent in the precarinal region measuring up to 2.8 cm slightly enlarged from the prior exam. Noted along the aortic arch measures 15 mm in short axis also slightly enlarged when compared with the prior exam. Bilateral hilar adenopathy is noted with a dominant 2.5 cm node stable in appearance in the right hilum. The esophagus as visualized is within normal limits. Right axillary adenopathy is noted slightly more prominent than that seen on the prior exam. Left-sided subpectoral adenopathy is noted. Lungs/Pleura: Lungs are well aerated  bilaterally. The previously seen mass lesion along the major fissure is again identified with slightly reduced in size measuring approximately 2.7 x 2.5 cm. It previously measured up to 3.7 cm. Patchy infiltrative density is noted in the right lower lobe somewhat limited in evaluation due to patient motion artifact. Patchy infiltrate is seen in the left upper lobe along the fissure as well as in the left lower lobe also new from the prior exam. No sizable effusion is seen. Upper Abdomen: Mild nodularity to the liver is noted similar to that seen on prior CT. No other focal abnormality in the upper abdomen is noted. Musculoskeletal: Degenerative changes of the thoracic spine  are seen. No acute bony abnormality is noted. IMPRESSION: Persistent right upper lobe mass although slightly smaller than that seen on the prior CT. Mediastinal adenopathy which is slightly more prominent than that seen on the prior exam. Bilateral hilar adenopathy is noted as well. Subpectoral adenopathy in right axillary adenopathy is noted in slightly more prominent than that noted on the prior exam. New patchy infiltrate in the left upper and lower lobe which has a somewhat masslike appearance in the lower lobe but likely related to extensive pneumonia given the relative abrupt onset. Cirrhotic changes of the liver similar to that seen on prior exam. Aortic Atherosclerosis (ICD10-I70.0). Electronically Signed   By: Inez Catalina M.D.   On: 05/25/2021 15:11   CT ABDOMEN PELVIS W CONTRAST  Result Date: 05/25/2021 CLINICAL DATA:  70 year old female with history of abdominal pain and fever. History of lung cancer with metastatic disease to the brain, status post radiation therapy in May 2022. EXAM: CT ABDOMEN AND PELVIS WITH CONTRAST TECHNIQUE: Multidetector CT imaging of the abdomen and pelvis was performed using the standard protocol following bolus administration of intravenous contrast. CONTRAST:  164mL OMNIPAQUE IOHEXOL 300 MG/ML  SOLN COMPARISON:  CT the chest, abdomen and pelvis 04/08/2021. FINDINGS: Lower chest: There is soft tissue prominence in a peribronchovascular distribution in the lung bases bilaterally (left greater than right). In addition, widespread areas of airspace consolidation are noted in the lung bases bilaterally, most severe in the left lower lobe and inferior segment of the lingula. Atherosclerotic calcifications in the descending thoracic aorta as well as the right coronary artery. Hepatobiliary: Diffuse low attenuation throughout the hepatic parenchyma, indicative of a background of hepatic steatosis. Liver also has a shrunken appearance and nodular contour, indicative of underlying  cirrhosis. No suspicious cystic or solid hepatic lesions. No intra or extrahepatic biliary ductal dilatation. Gallbladder is normal in appearance. Pancreas: No pancreatic mass. No pancreatic ductal dilatation. No pancreatic or peripancreatic fluid collections or inflammatory changes. Spleen: Unremarkable. Adrenals/Urinary Tract: Bilateral kidneys and adrenal glands are normal in appearance. No hydroureteronephrosis. Urinary bladder is normal in appearance. Stomach/Bowel: The appearance of the stomach is normal. There is no pathologic dilatation of small bowel or colon. Normal appendix. Vascular/Lymphatic: Aortic atherosclerosis, without evidence of aneurysm or dissection in the abdominal or pelvic vasculature. There is some fusiform ectasia of the distal infrarenal abdominal aorta which measures up to 2.5 x 2.2 cm in diameter shortly before the aortic bifurcation. No lymphadenopathy noted in the abdomen or pelvis. Reproductive: Uterus and ovaries are atrophic. Other: No significant volume of ascites.  No pneumoperitoneum. Musculoskeletal: There are no aggressive appearing lytic or blastic lesions noted in the visualized portions of the skeleton. IMPRESSION: 1. There are no acute findings noted in the abdomen or pelvis to account for the patient's symptoms. 2. However, the visualized portions of  the lung bases are very abnormal. Although much of these findings may relate to multilobar pneumonia, the profound soft tissue thickening in a peribronchovascular distribution in the lower lungs is very unusual in appearance and raises concern for potential lymphangitic spread of tumor. Further evaluation with contrast enhanced chest CT is recommended at this time to better evaluate the full extent of pulmonary disease. 3. Hepatic steatosis with hepatic cirrhosis. 4. Aortic atherosclerosis, in addition to at least right coronary artery disease. Please note that although the presence of coronary artery calcium documents the  presence of coronary artery disease, the severity of this disease and any potential stenosis cannot be assessed on this non-gated CT examination. Assessment for potential risk factor modification, dietary therapy or pharmacologic therapy may be warranted, if clinically indicated. 5. Additional incidental findings, as above. Electronically Signed   By: Vinnie Langton M.D.   On: 05/25/2021 13:59   DG Chest Port 1 View  Result Date: 05/25/2021 CLINICAL DATA:  Shortness of breath. EXAM: PORTABLE CHEST 1 VIEW COMPARISON:  04/07/2021 FINDINGS: Stable heart size. Redemonstrated right upper lobe lung mass measuring approximately 3.8 cm in diameter, stable to minimally progressed in size from prior. New patchy consolidation within the left mid to lower lung field. No pleural effusion or pneumothorax. IMPRESSION: 1. New patchy consolidation within the left mid to lower lung field concerning for pneumonia. 2. Stable to minimally progressed size of known right upper lobe lung mass. Electronically Signed   By: Davina Poke D.O.   On: 05/25/2021 11:04    Procedures .Critical Care  Date/Time: 05/25/2021 12:01 PM Performed by: Suzy Bouchard, PA-C Authorized by: Suzy Bouchard, PA-C   Critical care provider statement:    Critical care time (minutes):  40   Critical care was necessary to treat or prevent imminent or life-threatening deterioration of the following conditions:  Sepsis and endocrine crisis   Critical care was time spent personally by me on the following activities:  Discussions with consultants, evaluation of patient's response to treatment, examination of patient, ordering and performing treatments and interventions, ordering and review of laboratory studies, ordering and review of radiographic studies, pulse oximetry, re-evaluation of patient's condition, obtaining history from patient or surrogate and review of old charts   I assumed direction of critical care for this patient from  another provider in my specialty: no     Care discussed with: admitting provider     Medications Ordered in ED Medications  lactated ringers infusion (has no administration in time range)  cefTRIAXone (ROCEPHIN) 2 g in sodium chloride 0.9 % 100 mL IVPB (0 g Intravenous Stopped 05/25/21 1510)  azithromycin (ZITHROMAX) 500 mg in sodium chloride 0.9 % 250 mL IVPB (0 mg Intravenous Stopped 05/25/21 1510)  potassium chloride SA (KLOR-CON) CR tablet 40 mEq (has no administration in time range)  sodium chloride 0.9 % bolus 1,000 mL (0 mLs Intravenous Stopped 05/25/21 1133)  dextrose 50 % solution 50 mL (50 mLs Intravenous Given 05/25/21 1101)  lactated ringers bolus 1,000 mL (1,000 mLs Intravenous New Bag/Given 05/25/21 1509)    And  lactated ringers bolus 500 mL (500 mLs Intravenous New Bag/Given 05/25/21 1509)  iohexol (OMNIPAQUE) 300 MG/ML solution 100 mL (100 mLs Intravenous Contrast Given 05/25/21 1335)  iohexol (OMNIPAQUE) 300 MG/ML solution 75 mL (75 mLs Intravenous Contrast Given 05/25/21 1457)  acetaminophen (TYLENOL) tablet 650 mg (650 mg Oral Given 05/25/21 1534)    ED Course  I have reviewed the triage vital signs and the nursing  notes.  Pertinent labs & imaging results that were available during my care of the patient were reviewed by me and considered in my medical decision making (see chart for details).  Clinical Course as of 05/25/21 1548  Tue May 25, 2021  1054 Glucose-Capillary(!!): 25 Glucose found to be 25 after initial evaluation. 1 amp D50 given.  [AL]  9379 CXR reviewed concerning for pneumonia. Will start IV antibiotics for pneumonia. Code sepsis initiated.    [CA]  1200 Glucose-Capillary(!): 121 [CA]  1200 Troponin I (High Sensitivity)(!): 25 [CA]  1210 pO2, Ven(!!): <31.0 [CA]  1246 Lactic Acid, Venous(!!): 2.5 [CA]  1516 Hgb urine dipstickMarland Kitchen): MODERATE [CA]  1516 Bacteria, UA(!): RARE [CA]    Clinical Course User Index [CA] Suzy Bouchard, PA-C   MDM  Rules/Calculators/A&P                         70 year old female presents to the ED due to concerns about hypoglycemia.  Patient altered during initial evaluation, so obtain history from [facility].  Tammy Blair noted patient to be hypoglycemic at 44 earlier today and was instructed to report to the ED.  Patient has been complaining of diffuse abdominal pain and has been altered for the past few days per sister.  Patient has a history of metastatic lung cancer and is on chronic 2.5 L nasal cannula at baseline.  Upon arrival, patient febrile and tachycardic.  She appears to be altered. Patient on 6 L nasal cannula with increased work of breathing. Abdomen soft, non-distended, with diffuse abdominal tenderness. Sepsis labs ordered. IVFs started. D50 given for hypoglycemia. Patient able to drink OJ after D50 given and became more alert.  CXR concerning for pneumonia. IV antibiotics started. Code sepsis initiated.  CBC without leukocytosis.  Normal hemoglobin.  Initial troponin elevated at 25.  CMP significant for hyponatremia 132, hypokalemia at 3.2, hyperglycemia 38, transaminitis AST at 50 and ALT of 49.  Lactic acid normal; however 2nd elevated at 2.5.  BNP normal.  UA significant for moderate hematuria and rare bacteria.  12:30 PM reassessed patient at bedside. Patient much more alert and able to explain she has been having severe abdominal pain that radiates to back. Abdomen soft, non-distended with very mild tenderness. No rebound or guarding. CT abdomen ordered given history of cancer.  CT abdomen personally reviewed which demonstrates:  IMPRESSION:  1. There are no acute findings noted in the abdomen or pelvis to  account for the patient's symptoms.  2. However, the visualized portions of the lung bases are very  abnormal. Although much of these findings may relate to multilobar  pneumonia, the profound soft tissue thickening in a  peribronchovascular distribution in the lower lungs is very unusual   in appearance and raises concern for potential lymphangitic spread  of tumor. Further evaluation with contrast enhanced chest CT is  recommended at this time to better evaluate the full extent of  pulmonary disease.  3. Hepatic steatosis with hepatic cirrhosis.  4. Aortic atherosclerosis, in addition to at least right coronary  artery disease. Please note that although the presence of coronary  artery calcium documents the presence of coronary artery disease,  the severity of this disease and any potential stenosis cannot be  assessed on this non-gated CT examination. Assessment for potential  risk factor modification, dietary therapy or pharmacologic therapy  may be warranted, if clinically indicated.  5. Additional incidental findings, as above.    2:15 PM Discussed  with Dr. Weber Cooks with radiology CT results. He notes whatever he can few in the lungs is most likely causing patient's fever and recommends CT chest with contrast. He notes the appearance looks drastically different than previous scans of her adenocarcinoma. CT chest ordered.   CT chest personally reviewed which demonstrates: IMPRESSION:  Persistent right upper lobe mass although slightly smaller than that  seen on the prior CT.     Mediastinal adenopathy which is slightly more prominent than that  seen on the prior exam. Bilateral hilar adenopathy is noted as well.  Subpectoral adenopathy in right axillary adenopathy is noted in  slightly more prominent than that noted on the prior exam.     New patchy infiltrate in the left upper and lower lobe which has a  somewhat masslike appearance in the lower lobe but likely related to  extensive pneumonia given the relative abrupt onset.     Cirrhotic changes of the liver similar to that seen on prior exam.     Aortic Atherosclerosis (ICD10-I70.0).   Suspect symptoms related to extensive pneumonia. IVFs and antibiotics already started. Will consult hospitalist for  admission.   Discussed case with Dr. Denton Brick with [facility] who agrees to admit patient for further treatment.  Final Clinical Impression(s) / ED Diagnoses Final diagnoses:  Community acquired pneumonia, unspecified laterality    Rx / DC Orders ED Discharge Orders     None        Karie Kirks 05/25/21 1601    Elnora Morrison, MD 05/31/21 1616    Elnora Morrison, MD 05/31/21 2317

## 2021-05-25 NOTE — ED Triage Notes (Signed)
Pt to the ED from home with c/o shortness of breathe.  Pt has lung and brain CA and finished radiation treatments in May.

## 2021-05-26 ENCOUNTER — Other Ambulatory Visit (HOSPITAL_COMMUNITY): Payer: Medicare Other

## 2021-05-26 ENCOUNTER — Ambulatory Visit (HOSPITAL_COMMUNITY): Payer: Medicare Other

## 2021-05-26 DIAGNOSIS — A419 Sepsis, unspecified organism: Secondary | ICD-10-CM

## 2021-05-26 DIAGNOSIS — J181 Lobar pneumonia, unspecified organism: Secondary | ICD-10-CM

## 2021-05-26 DIAGNOSIS — Z515 Encounter for palliative care: Secondary | ICD-10-CM | POA: Diagnosis not present

## 2021-05-26 DIAGNOSIS — E162 Hypoglycemia, unspecified: Secondary | ICD-10-CM | POA: Diagnosis not present

## 2021-05-26 DIAGNOSIS — R918 Other nonspecific abnormal finding of lung field: Secondary | ICD-10-CM | POA: Diagnosis not present

## 2021-05-26 DIAGNOSIS — G9341 Metabolic encephalopathy: Secondary | ICD-10-CM | POA: Diagnosis not present

## 2021-05-26 DIAGNOSIS — Z7189 Other specified counseling: Secondary | ICD-10-CM

## 2021-05-26 DIAGNOSIS — J9621 Acute and chronic respiratory failure with hypoxia: Secondary | ICD-10-CM | POA: Diagnosis not present

## 2021-05-26 LAB — CBC
HCT: 29.6 % — ABNORMAL LOW (ref 36.0–46.0)
Hemoglobin: 9.6 g/dL — ABNORMAL LOW (ref 12.0–15.0)
MCH: 31.5 pg (ref 26.0–34.0)
MCHC: 32.4 g/dL (ref 30.0–36.0)
MCV: 97 fL (ref 80.0–100.0)
Platelets: 73 10*3/uL — ABNORMAL LOW (ref 150–400)
RBC: 3.05 MIL/uL — ABNORMAL LOW (ref 3.87–5.11)
RDW: 14.6 % (ref 11.5–15.5)
WBC: 6 10*3/uL (ref 4.0–10.5)
nRBC: 0.3 % — ABNORMAL HIGH (ref 0.0–0.2)

## 2021-05-26 LAB — GLUCOSE, CAPILLARY
Glucose-Capillary: 121 mg/dL — ABNORMAL HIGH (ref 70–99)
Glucose-Capillary: 138 mg/dL — ABNORMAL HIGH (ref 70–99)
Glucose-Capillary: 155 mg/dL — ABNORMAL HIGH (ref 70–99)
Glucose-Capillary: 169 mg/dL — ABNORMAL HIGH (ref 70–99)
Glucose-Capillary: 177 mg/dL — ABNORMAL HIGH (ref 70–99)
Glucose-Capillary: 510 mg/dL (ref 70–99)

## 2021-05-26 LAB — BASIC METABOLIC PANEL
Anion gap: 8 (ref 5–15)
BUN: 17 mg/dL (ref 8–23)
CO2: 33 mmol/L — ABNORMAL HIGH (ref 22–32)
Calcium: 7.7 mg/dL — ABNORMAL LOW (ref 8.9–10.3)
Chloride: 93 mmol/L — ABNORMAL LOW (ref 98–111)
Creatinine, Ser: 0.33 mg/dL — ABNORMAL LOW (ref 0.44–1.00)
GFR, Estimated: >60 mL/min (ref >60)
Glucose, Bld: 105 mg/dL — ABNORMAL HIGH (ref 70–99)
Potassium: 3.2 mmol/L — ABNORMAL LOW (ref 3.5–5.1)
Sodium: 134 mmol/L — ABNORMAL LOW (ref 135–145)

## 2021-05-26 LAB — MRSA NEXT GEN BY PCR, NASAL: MRSA by PCR Next Gen: NOT DETECTED

## 2021-05-26 MED ORDER — INSULIN ASPART 100 UNIT/ML IJ SOLN
0.0000 [IU] | Freq: Every day | INTRAMUSCULAR | Status: DC
  Administered 2021-05-28: 2 [IU] via SUBCUTANEOUS
  Administered 2021-05-29: 3 [IU] via SUBCUTANEOUS
  Administered 2021-05-30 – 2021-05-31 (×2): 4 [IU] via SUBCUTANEOUS

## 2021-05-26 MED ORDER — HYDROCORTISONE NA SUCCINATE PF 100 MG IJ SOLR
50.0000 mg | Freq: Two times a day (BID) | INTRAMUSCULAR | Status: DC
  Administered 2021-05-26 – 2021-05-27 (×2): 50 mg via INTRAVENOUS
  Filled 2021-05-26 (×2): qty 2

## 2021-05-26 MED ORDER — INSULIN ASPART 100 UNIT/ML IJ SOLN
25.0000 [IU] | Freq: Once | INTRAMUSCULAR | Status: AC
  Administered 2021-05-26: 25 [IU] via SUBCUTANEOUS

## 2021-05-26 MED ORDER — IPRATROPIUM-ALBUTEROL 0.5-2.5 (3) MG/3ML IN SOLN
3.0000 mL | Freq: Four times a day (QID) | RESPIRATORY_TRACT | Status: DC
  Administered 2021-05-26 – 2021-05-28 (×9): 3 mL via RESPIRATORY_TRACT
  Filled 2021-05-26 (×9): qty 3

## 2021-05-26 MED ORDER — INSULIN ASPART 100 UNIT/ML IJ SOLN
0.0000 [IU] | Freq: Three times a day (TID) | INTRAMUSCULAR | Status: DC
  Administered 2021-05-27: 11 [IU] via SUBCUTANEOUS
  Administered 2021-05-27: 4 [IU] via SUBCUTANEOUS
  Administered 2021-05-27: 7 [IU] via SUBCUTANEOUS
  Administered 2021-05-28: 20 [IU] via SUBCUTANEOUS
  Administered 2021-05-28: 11 [IU] via SUBCUTANEOUS
  Administered 2021-05-28 – 2021-05-29 (×2): 7 [IU] via SUBCUTANEOUS
  Administered 2021-05-29: 3 [IU] via SUBCUTANEOUS
  Administered 2021-05-29: 11 [IU] via SUBCUTANEOUS
  Administered 2021-05-30: 7 [IU] via SUBCUTANEOUS
  Administered 2021-05-30: 4 [IU] via SUBCUTANEOUS
  Administered 2021-05-30: 11 [IU] via SUBCUTANEOUS
  Administered 2021-05-31 – 2021-06-01 (×3): 4 [IU] via SUBCUTANEOUS
  Administered 2021-06-01: 3 [IU] via SUBCUTANEOUS

## 2021-05-26 NOTE — Progress Notes (Signed)
PROGRESS NOTE  Tammy Blair ZOX:[id number] DOB: Sep 25, 1951 DOA: 05/25/2021 PCP: [name], MD  Brief History:  History per Dr. Arlyce Dice  70 y.o. 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 Patient was brought to the ED with multiple complaints which included decreased responsiveness.  History is obtained from patient's daughter [name] who is at bedside as at the time of y evaluation patient is altered and unable to answer questions appropriately. Daughter reports symptoms started 2 days ago, and patient has rapidly declined since then.  Has been sleeping more, confused, unable to get out of bed, with reported increasing difficulty breathing, onset of productive cough that started yesterday.  She also complained of lower abdominal pain.  Blood sugar was checked this morning and was 44, patient was brought to the ED.   ED Course: Temperature 100.8, heart rate 98-113, respiratory rate 17-26, lactic acid 1.7 >> 2.5 .  O2 sats 91 to 95% on 6 L.  WBC 8.9.  Blood glucose 38.  CT Abd/pelvis with contrast-without acute intra-abdominal findings, CT chest w contrast-shows new patchy infiltrate in the left upper and lower lobe which has a somewhat masslike appearance in the lower lobe but likely related to extensive pneumonia.  Also persistent right upper lobe mass slightly smaller than prior CT.  2.5 L sepsis fluid bolus given.  IV ceftriaxone and azithromycin started.  D50  50 mils given.  Hospitalist to admit   Assessment/Plan:  Severe Sepsis  -present on admission-tachycardic to 113, febrile to 100.8, tachypneic to 26, with evidence of endorgan dysfunction lactic acidosis of 2.5 and acute on chronic respiratory failure.   -secondary to lobar pneumonia -CT chest shows left upper and lower lobe patchy infiltrates.  COVID test negative -IV ceftriaxone and azithromycin started in  ED -Follow-up blood and urine cultures -Trend lactic acid -2.5 L bolus given, continue hydration with N/s  100cc/hr x 15hrs -05/25/21 ABG--7.38/68/76/32 (0.44) -Patient is on chronic steroids--continuestress dose steroids with hydrocortisone 50mg  3 times daily - Urine Legionella and strep   Acute metabolic encephalopathy -secondary to sepsis, hypoglycemia in setting of metastatic brain cancer. -head CT--bilateral metastatic foci with interval improvement of vasogenic edema - ental status improving ,patient requesting her home benzos and narcotics- resumed.    Hypoglycemia , controlled diabetes mellitus -blood glucose down to 25 in the ED > 121> 79 >111  status post 50 mils D50.   -from sepsis, relative adrenal insufficiencyand home insulins 70/30  30 units twice daily. A1c- 6.3. -Monitor glucose closely -Start dextrose containing fluid -Hold home insulin, metformin - If becomes elevated while on stress steroids, will need SSI.   Acute on chronic respiratory failure- -from pneumonia, O2 sats on my evaluation down to 89% on home 2-1/2 L, currently on 6 L maintaining sats greater than 93%. 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 Alvy Bimler.   -Completed palliative radiation therapy to the brain.   -Per last visit, patient is profoundly cushingoid and had classical signs of steroid-induced myopathy.  Steroid taper was started. -Palliative care consult -Hold steroid taper for now, and start stress dose steroids with hydrocortisone   Hypertension - Hold chlortalidone, while hydrating - Resume Cardizem   COPD-  - PRN and scheduled duonebs  Goals of Care -DNR -palliative medicine consult       Status  is: Inpatient  Remains inpatient appropriate because:Hemodynamically unstable  Dispo: The patient is from: Home              Anticipated d/c is to: Home              Patient currently is not medically stable to d/c.    Difficult to place patient No        Family Communication:   Family at bedside  Consultants:    Code Status:  FULL / DNR  DVT Prophylaxis:  [place] Heparin / [place] Lovenox   Procedures: As Listed in Progress Note Above  Antibiotics: None  RN Pressure Injury Documentation: Pressure Injury 05/25/21 Sacrum Medial Stage 2 -  Partial thickness loss of dermis presenting as a shallow open injury with a red, pink wound bed without slough. (Active)  05/25/21 1730  Location: Sacrum  Location Orientation: Medial  Staging: Stage 2 -  Partial thickness loss of dermis presenting as a shallow open injury with a red, pink wound bed without slough.  Wound Description (Comments):   Present on Admission: Yes        Subjective: Patient denies fevers, chills, headache, chest pain, dyspnea, nausea, vomiting, diarrhea, abdominal pain, dysuria,   Objective: Vitals:   05/26/21 0545 05/26/21 0745 05/26/21 0800 05/26/21 0940  BP:    (!) 106/38  Pulse:      Resp:      Temp: 98.2 F (36.8 C)  98.6 F (37 C)   TempSrc: Oral  Oral   SpO2:  100%    Weight:      Height:        Intake/Output Summary (Last 24 hours) at 05/26/2021 0954 Last data filed at 05/26/2021 0622 Gross per 24 hour  Intake 2461.56 ml  Output --  Net 0623.56 ml   Weight change:  Exam:  General:  Pt is alert, follows commands appropriately, not in acute distress HEENT: No icterus, No thrush, No neck mass, [place]/AT Cardiovascular: RRR, S1/S2, no rubs, no gallops Respiratory: bilateral rhonchi Abdomen: Soft/+BS, non tender, non distended, no guarding Extremities: No edema, No lymphangitis, No petechiae, No rashes, no synovitis   Data Reviewed: I have personally reviewed following labs and imaging studies Basic Metabolic Panel: Recent Labs  Lab 05/25/21 1107 05/26/21 0504  NA 132* 134*  K 3.2* 3.2*  CL 84* 93*  CO2 36* 33*  GLUCOSE 38* 105*  BUN 26* 17  CREATININE 0.48 0.33*  CALCIUM 8.6* 7.7*   Liver  Function Tests: Recent Labs  Lab 05/25/21 1107  AST 50*  ALT 49*  ALKPHOS 73  BILITOT 1.7*  PROT 6.8  ALBUMIN 2.9*   Recent Labs  Lab 05/25/21 1107  LIPASE 17   No results for input(s): AMMONIA in the last 168 hours. Coagulation Profile: Recent Labs  Lab 05/25/21 1107  INR 1.1   CBC: Recent Labs  Lab 05/25/21 1107 05/26/21 0504  WBC 8.9 6.0  NEUTROABS 5.7  --   HGB 12.2 9.6*  HCT 36.2 29.6*  MCV 94.3 97.0  PLT 118* 73*   Cardiac Enzymes: No results for input(s): CKTOTAL, CKMB, CKMBINDEX, TROPONINI in the last 168 hours. BNP: Invalid input(s): POCBNP CBG: Recent Labs  Lab 05/25/21 2006 05/25/21 2248 05/26/21 0024 05/26/21 0209 05/26/21 0801  GLUCAP 96 171* 155* 138* 121*   HbA1C: No results for input(s): HGBA1C in the last 72 hours. Urine analysis:    Component Value Date/Time   COLORURINE YELLOW 05/25/2021 1403   APPEARANCEUR HAZY (A) 05/25/2021  1403   LABSPEC 1.013 05/25/2021 1403   PHURINE 6.0 05/25/2021 1403   GLUCOSEU 150 (A) 05/25/2021 1403   HGBUR MODERATE (A) 05/25/2021 1403   BILIRUBINUR NEGATIVE 05/25/2021 1403   KETONESUR NEGATIVE 05/25/2021 1403   PROTEINUR NEGATIVE 05/25/2021 1403   UROBILINOGEN 0.2 07/17/2014 1613   NITRITE NEGATIVE 05/25/2021 1403   LEUKOCYTESUR NEGATIVE 05/25/2021 1403   Sepsis Labs: @LABRCNTIP (procalcitonin:4,lacticidven:4) ) Recent Results (from the past 240 hour(s))  Resp Panel by RT-PCR (Flu A&B, Covid) Nasopharyngeal Swab     Status: None   Collection Time: 05/25/21 10:45 AM   Specimen: Nasopharyngeal Swab; Nasopharyngeal(NP) swabs in vial transport medium  Result Value Ref Range Status   SARS Coronavirus 2 by RT PCR NEGATIVE NEGATIVE Final    Comment: (NOTE) SARS-CoV-2 target nucleic acids are NOT DETECTED.  The SARS-CoV-2 RNA is generally detectable in upper respiratory specimens during the acute phase of infection. The lowest concentration of SARS-CoV-2 viral copies this assay can detect is 138  copies/mL. A negative result does not preclude SARS-Cov-2 infection and should not be used as the sole basis for treatment or other patient management decisions. A negative result may occur with  improper specimen collection/handling, submission of specimen other than nasopharyngeal swab, presence of viral mutation(s) within the areas targeted by this assay, and inadequate number of viral copies(<138 copies/mL). A negative result must be combined with clinical observations, patient history, and epidemiological information. The expected result is Negative.  Fact Sheet for Patients:  EntrepreneurPulse.com.au  Fact Sheet for Healthcare Providers:  IncredibleEmployment.be  This test is no t yet approved or cleared by the [place] FDA and  has been authorized for detection and/or diagnosis of SARS-CoV-2 by FDA under an Emergency Use Authorization (EUA). This EUA will remain  in effect (meaning this test can be used) for the duration of the COVID-19 declaration under Section 564(b)(1) of the Act, 21 U.S.C.section 360bbb-3(b)(1), unless the authorization is terminated  or revoked sooner.       Influenza A by PCR NEGATIVE NEGATIVE Final   Influenza B by PCR NEGATIVE NEGATIVE Final    Comment: (NOTE) The Xpert Xpress SARS-CoV-2/FLU/RSV plus assay is intended as an aid in the diagnosis of influenza from Nasopharyngeal swab specimens and should not be used as a sole basis for treatment. Nasal washings and aspirates are unacceptable for Xpert Xpress SARS-CoV-2/FLU/RSV testing.  Fact Sheet for Patients: EntrepreneurPulse.com.au  Fact Sheet for Healthcare Providers: IncredibleEmployment.be  This test is not yet approved or cleared by the [place] FDA and has been authorized for detection and/or diagnosis of SARS-CoV-2 by FDA under an Emergency Use Authorization (EUA). This EUA will remain in effect (meaning  this test can be used) for the duration of the COVID-19 declaration under Section 564(b)(1) of the Act, 21 U.S.C. section 360bbb-3(b)(1), unless the authorization is terminated or revoked.  Performed at [facility], [address], [place], [place] [address]   Blood culture (routine single)     Status: None (Preliminary result)   Collection Time: 05/25/21 11:08 AM   Specimen: BLOOD  Result Value Ref Range Status   Specimen Description BLOOD RIGHT ANTECUBITAL  Final   Special Requests   Final    Blood Culture results may not be optimal due to an inadequate volume of blood received in culture bottles BOTTLES DRAWN AEROBIC AND ANAEROBIC   Culture   Final    NO GROWTH < 24 HOURS Performed at [facility], [address] [place], [place] [address]  Report Status PENDING  Incomplete  Culture, blood (single)     Status: None (Preliminary result)   Collection Time: 05/25/21 11:19 AM   Specimen: BLOOD  Result Value Ref Range Status   Specimen Description BLOOD LEFT ANTECUBITAL  Final   Special Requests   Final    Blood Culture adequate volume BOTTLES DRAWN AEROBIC AND ANAEROBIC   Culture   Final    NO GROWTH < 24 HOURS Performed at [facility], [address], [place], [place] [address]    Report Status PENDING  Incomplete  MRSA Next Gen by PCR, Nasal     Status: None   Collection Time: 05/25/21  5:52 PM   Specimen: Nasal Mucosa; Nasal Swab  Result Value Ref Range Status   MRSA by PCR Next Gen NOT DETECTED NOT DETECTED Final    Comment: (NOTE) The GeneXpert MRSA Assay (FDA approved for NASAL specimens only), is one component of a comprehensive MRSA colonization surveillance program. It is not intended to diagnose MRSA infection nor to guide or monitor treatment for MRSA infections. Test performance is not FDA approved in patients less than 41 years old. Performed at [facility], [address] [place], [place] [address]      Scheduled Meds:  Chlorhexidine Gluconate Cloth  6  each Topical Daily   diltiazem  240 mg Oral Daily   enoxaparin (LOVENOX) injection  40 mg Subcutaneous Q24H   hydrocortisone sod succinate (SOLU-CORTEF) inj  50 mg Intravenous Q8H   mometasone-formoterol  2 puff Inhalation BID   umeclidinium bromide  1 puff Inhalation Daily   Continuous Infusions:  sodium chloride     azithromycin Stopped (05/25/21 1503)   cefTRIAXone (ROCEPHIN)  IV 2 g (05/26/21 0947)    Procedures/Studies: CT HEAD WO CONTRAST  Result Date: 05/25/2021 CLINICAL DATA:  Altered mental status. History of lung cancer with brain metastasis. EXAM: CT HEAD WITHOUT CONTRAST TECHNIQUE: Contiguous axial images were obtained from the base of the skull through the vertex without intravenous contrast. COMPARISON:  CT head 04/07/2021.  MRI brain 04/08/2021 FINDINGS: Brain: Since the prior study, there is interval decrease of areas of vasogenic edema seen previously in the left parietal region, right parietal region, and right occipital region. This suggest response to interval therapy. Vague areas of slightly increased attenuation are demonstrated corresponding to known metastatic lesions in the parietal regions bilaterally. No mass-effect or midline shift. Mild ventricular dilatation. Gray-white matter junctions are mostly distinct. Basal cisterns are not effaced. No acute intracranial hemorrhage is identified. Vascular: Intracranial arterial vascular calcifications. Skull: Calvarium appears intact. Sinuses/Orbits: Mild mucosal thickening in the paranasal sinuses. No acute air-fluid levels. Opacification of the mastoid air cells. Other: None. IMPRESSION: 1. Bilateral metastatic foci with interval improvement of vasogenic edema since prior study. 2. No acute intracranial hemorrhage or significant mass effect. 3. Bilateral mastoid effusions. Electronically Signed   By: Lucienne Capers M.D.   On: 05/25/2021 17:20   CT Chest W Contrast  Result Date: 05/25/2021 CLINICAL DATA:  Known history of  lung carcinoma with metastatic disease and abnormal chest x-ray, initial encounter EXAM: CT CHEST WITH CONTRAST TECHNIQUE: Multidetector CT imaging of the chest was performed during intravenous contrast administration. CONTRAST:  23mL OMNIPAQUE IOHEXOL 300 MG/ML  SOLN COMPARISON:  Chest x-ray from earlier in the same day, CT from 04/08/2021 FINDINGS: Cardiovascular: Atherosclerotic calcifications of the thoracic aorta are noted. No aneurysmal dilatation or dissection is noted. Heart is at the upper limits of normal in size. Mild coronary calcifications are  noted. No definitive emboli are seen although timing was not performed for embolus evaluation. Mediastinum/Nodes: Thoracic inlet is within normal limits. Scattered mediastinal adenopathy is noted most prominent in the precarinal region measuring up to 2.8 cm slightly enlarged from the prior exam. Noted along the aortic arch measures 15 mm in short axis also slightly enlarged when compared with the prior exam. Bilateral hilar adenopathy is noted with a dominant 2.5 cm node stable in appearance in the right hilum. The esophagus as visualized is within normal limits. Right axillary adenopathy is noted slightly more prominent than that seen on the prior exam. Left-sided subpectoral adenopathy is noted. Lungs/Pleura: Lungs are well aerated bilaterally. The previously seen mass lesion along the major fissure is again identified with slightly reduced in size measuring approximately 2.7 x 2.5 cm. It previously measured up to 3.7 cm. Patchy infiltrative density is noted in the right lower lobe somewhat limited in evaluation due to patient motion artifact. Patchy infiltrate is seen in the left upper lobe along the fissure as well as in the left lower lobe also new from the prior exam. No sizable effusion is seen. Upper Abdomen: Mild nodularity to the liver is noted similar to that seen on prior CT. No other focal abnormality in the upper abdomen is noted. Musculoskeletal:  Degenerative changes of the thoracic spine are seen. No acute bony abnormality is noted. IMPRESSION: Persistent right upper lobe mass although slightly smaller than that seen on the prior CT. Mediastinal adenopathy which is slightly more prominent than that seen on the prior exam. Bilateral hilar adenopathy is noted as well. Subpectoral adenopathy in right axillary adenopathy is noted in slightly more prominent than that noted on the prior exam. New patchy infiltrate in the left upper and lower lobe which has a somewhat masslike appearance in the lower lobe but likely related to extensive pneumonia given the relative abrupt onset. Cirrhotic changes of the liver similar to that seen on prior exam. Aortic Atherosclerosis (ICD10-I70.0). Electronically Signed   By: Inez Catalina M.D.   On: 05/25/2021 15:11   CT ABDOMEN PELVIS W CONTRAST  Result Date: 05/25/2021 CLINICAL DATA:  70 year old female with history of abdominal pain and fever. History of lung cancer with metastatic disease to the brain, status post radiation therapy in May 2022. EXAM: CT ABDOMEN AND PELVIS WITH CONTRAST TECHNIQUE: Multidetector CT imaging of the abdomen and pelvis was performed using the standard protocol following bolus administration of intravenous contrast. CONTRAST:  185mL OMNIPAQUE IOHEXOL 300 MG/ML  SOLN COMPARISON:  CT the chest, abdomen and pelvis 04/08/2021. FINDINGS: Lower chest: There is soft tissue prominence in a peribronchovascular distribution in the lung bases bilaterally (left greater than right). In addition, widespread areas of airspace consolidation are noted in the lung bases bilaterally, most severe in the left lower lobe and inferior segment of the lingula. Atherosclerotic calcifications in the descending thoracic aorta as well as the right coronary artery. Hepatobiliary: Diffuse low attenuation throughout the hepatic parenchyma, indicative of a background of hepatic steatosis. Liver also has a shrunken appearance and  nodular contour, indicative of underlying cirrhosis. No suspicious cystic or solid hepatic lesions. No intra or extrahepatic biliary ductal dilatation. Gallbladder is normal in appearance. Pancreas: No pancreatic mass. No pancreatic ductal dilatation. No pancreatic or peripancreatic fluid collections or inflammatory changes. Spleen: Unremarkable. Adrenals/Urinary Tract: Bilateral kidneys and adrenal glands are normal in appearance. No hydroureteronephrosis. Urinary bladder is normal in appearance. Stomach/Bowel: The appearance of the stomach is normal. There is no pathologic dilatation  of small bowel or colon. Normal appendix. Vascular/Lymphatic: Aortic atherosclerosis, without evidence of aneurysm or dissection in the abdominal or pelvic vasculature. There is some fusiform ectasia of the distal infrarenal abdominal aorta which measures up to 2.5 x 2.2 cm in diameter shortly before the aortic bifurcation. No lymphadenopathy noted in the abdomen or pelvis. Reproductive: Uterus and ovaries are atrophic. Other: No significant volume of ascites.  No pneumoperitoneum. Musculoskeletal: There are no aggressive appearing lytic or blastic lesions noted in the visualized portions of the skeleton. IMPRESSION: 1. There are no acute findings noted in the abdomen or pelvis to account for the patient's symptoms. 2. However, the visualized portions of the lung bases are very abnormal. Although much of these findings may relate to multilobar pneumonia, the profound soft tissue thickening in a peribronchovascular distribution in the lower lungs is very unusual in appearance and raises concern for potential lymphangitic spread of tumor. Further evaluation with contrast enhanced chest CT is recommended at this time to better evaluate the full extent of pulmonary disease. 3. Hepatic steatosis with hepatic cirrhosis. 4. Aortic atherosclerosis, in addition to at least right coronary artery disease. Please note that although the presence  of coronary artery calcium documents the presence of coronary artery disease, the severity of this disease and any potential stenosis cannot be assessed on this non-gated CT examination. Assessment for potential risk factor modification, dietary therapy or pharmacologic therapy may be warranted, if clinically indicated. 5. Additional incidental findings, as above. Electronically Signed   By: Vinnie Langton M.D.   On: 05/25/2021 13:59   DG Chest Port 1 View  Result Date: 05/25/2021 CLINICAL DATA:  Shortness of breath. EXAM: PORTABLE CHEST 1 VIEW COMPARISON:  04/07/2021 FINDINGS: Stable heart size. Redemonstrated right upper lobe lung mass measuring approximately 3.8 cm in diameter, stable to minimally progressed in size from prior. New patchy consolidation within the left mid to lower lung field. No pleural effusion or pneumothorax. IMPRESSION: 1. New patchy consolidation within the left mid to lower lung field concerning for pneumonia. 2. Stable to minimally progressed size of known right upper lobe lung mass. Electronically Signed   By: Davina Poke D.O.   On: 05/25/2021 11:04    Orson Eva, DO  [facility]  If 7PM-7AM, please contact night-coverage [email or web address] Password [facility] 05/26/2021, 9:54 AM   LOS: 1 day

## 2021-05-26 NOTE — Consult Note (Signed)
Consultation Note Date: 05/26/2021   Patient Name: Tammy Blair  DOB: Apr 09, 1951  MRN: [id number]  Age / Sex: 70 y.o., female  PCP: Tammy Rim, MD Referring Physician: Orson Eva, MD  Reason for Consultation: Establishing goals of care  HPI/Patient Profile: 70 y.o. female  with past medical history of lung cancer with brain mets, COPD on 2.5L oxygen, hypertension, diabetes admitted on 05/25/2021 with shortness of breath, abd pain, shortness of breath and found to have sepsis pneumonia.   Clinical Assessment and Goals of Care: I met today at Tammy Blair bedside. She is awake, alert, and I believe oriented but so hard of hearing it is difficult to communicate with her. She continues to require 5L of oxygen. Her grandson comes to bedside and reports that she become gradually weaker over the past ~1-1.5 weeks until she was unresponsive and EMS was called. He reports that she was previously able to walk with walker to back porch. He reports that she has maintained good appetite and intake. She has been staying with her sister and help from children (mostly daughter Tammy Blair) and grandchildren. We discussed treatment of her pneumonia.   I called and spoke with daughter, Tammy Blair. I discussed with Tammy Blair her mother's pneumonia in left lung and cancer in right lung she is needing more oxygen. Tammy Blair understands. I asked Tammy Blair about where they left off with Tammy Blair cancer treatment plan. I noted plans for potential immunotherapy from Tammy Blair note. Tammy Blair shares that there were plans for this but that they were told that she would need port placed to receive treatment and Tammy Blair shares that her mother does not want to have port placed and therefore not pursuing treatment. Tammy Blair respects her mother's wishes and they understand that treatment would not be curative. We did discuss anticipation that cancer will  progress and care needs will increase. Tammy Blair expresses desire to bring her mother back home at time of discharge. I introduced to [place] the idea of hospice support in the home if they have no plans to pursue treatment. Tammy Blair admits that her mother would struggle with the idea of hospice but Tammy Blair was interested in learning more. We discussed the hospice philosophy and care model. Tammy Blair would like to speak with her mother and family and consider further. I encouraged Tammy Blair that this is certainly not something that needs to be decided today but is something we can put in place for them at time of discharge if desired. In the meantime they are hopeful for pneumonia to be treated and health optimized. Tammy Blair plans to be at bedside tomorrow and we can discuss further.   All questions/concerns addressed. Emotional support provided.   Primary Decision Maker NEXT OF KIN daughter Tammy Blair    SUMMARY OF RECOMMENDATIONS   - They have decided not to pursue treatment of cancer.  - I introduced the idea and option of hospice support in the home.   Code Status/Advance Care Planning: DNR   Symptom Management:  Per attending.   Palliative Prophylaxis:  Aspiration, Bowel Regimen, Frequent Pain Assessment, and Turn Reposition  Prognosis:  < 6 months very likely without cancer treatment.   Discharge Planning: To Be Determined      Primary Diagnoses: Present on Admission:  Sepsis ([place])  Essential hypertension  Right lower lobe lung mass  Metastatic cancer to brain ([place])  Hypoglycemia  Acute metabolic encephalopathy  Acute on chronic respiratory failure with hypoxia ([place])   I have reviewed the medical record, interviewed the patient and family, and examined the patient. The following aspects are pertinent.  Past Medical History:  Diagnosis Date   Anxiety    Asthma    Cancer ([place])    Chronic back pain    COPD (chronic obstructive pulmonary disease) ([facility])    on home O2    Diabetes mellitus without complication ([facility])    HOH (hard of hearing)    Hypertension    Lumbar radiculopathy    Social History   Socioeconomic History   Marital status: Divorced    Spouse name: Not on file   Number of children: Not on file   Years of education: Not on file   Highest education level: Not on file  Occupational History   Not on file  Tobacco Use   Smoking status: Former    Packs/day: 0.50    Years: 40.00    Pack years: 20.00    Types: Cigarettes    Quit date: 04/11/2021    Years since quitting: 0.1   Smokeless tobacco: Never  Vaping Use   Vaping Use: Never used  Substance and Sexual Activity   Alcohol use: No   Drug use: Not Currently   Sexual activity: Not Currently  Other Topics Concern   Not on file  Social History Narrative   Not on file   Social Determinants of Health   Financial Resource Strain: Not on file  Food Insecurity: Not on file  Transportation Needs: Not on file  Physical Activity: Not on file  Stress: Not on file  Social Connections: Not on file   Family History  Problem Relation Age of Onset   COPD Mother    Cancer Mother        kidney   Stroke Brother    Asthma Sister    Scheduled Meds:  Chlorhexidine Gluconate Cloth  6 each Topical Daily   diltiazem  240 mg Oral Daily   enoxaparin (LOVENOX) injection  40 mg Subcutaneous Q24H   hydrocortisone sod succinate (SOLU-CORTEF) inj  50 mg Intravenous Q8H   mometasone-formoterol  2 puff Inhalation BID   umeclidinium bromide  1 puff Inhalation Daily   Continuous Infusions:  sodium chloride 100 mL/hr at 05/26/21 0622   sodium chloride     azithromycin Stopped (05/25/21 1503)   cefTRIAXone (ROCEPHIN)  IV Stopped (05/25/21 1303)   PRN Meds:.acetaminophen **OR** acetaminophen, ALPRAZolam, ipratropium-albuterol, ondansetron **OR** ondansetron (ZOFRAN) IV, oxyCODONE, polyethylene glycol No Known Allergies Review of Systems  Constitutional:  Positive for activity change and fatigue.   Respiratory:  Positive for shortness of breath.   Neurological:  Positive for weakness.   Physical Exam Vitals and nursing note reviewed.  Constitutional:      General: She is not in acute distress.    Appearance: She is morbidly obese. She is ill-appearing.  Cardiovascular:     Rate and Rhythm: Normal rate.  Pulmonary:     Effort: No tachypnea, accessory muscle usage or respiratory distress.  Abdominal:     Palpations: Abdomen is soft.  Neurological:  Mental Status: She is alert.     Comments: She seems oriented but difficult to fully assess given her hearing deficit    Vital Signs: BP (!) 93/35   Pulse 84   Temp 98.2 F (36.8 C) (Oral)   Resp (!) 21   Ht $R'5\' 4"'kN$  (1.626 m)   Wt 78.1 kg   SpO2 100%   BMI 29.55 kg/m  Pain Scale: 0-10   Pain Score: 0-No pain   SpO2: SpO2: 100 % O2 Device:SpO2: 100 % O2 Flow Rate: .O2 Flow Rate (L/min): 7 L/min  IO: Intake/output summary:  Intake/Output Summary (Last 24 hours) at 05/26/2021 0838 Last data filed at 05/26/2021 0622 Gross per 24 hour  Intake 2461.56 ml  Output --  Net 3570.56 ml    LBM: Last BM Date: 05/26/21 Baseline Weight: Weight: 76.7 kg Most recent weight: Weight: 78.1 kg     Palliative Assessment/Data:     Time In/Out: 1300-1330, 1779-3903 Time Total: 55 min Greater than 50%  of this time was spent counseling and coordinating care related to the above assessment and plan.  Signed by: Vinie Sill, NP Palliative Medicine Team Pager # [phone] (M-F 8a-5p) Team Phone # [phone] (Nights/Weekends)

## 2021-05-27 DIAGNOSIS — A419 Sepsis, unspecified organism: Secondary | ICD-10-CM | POA: Diagnosis not present

## 2021-05-27 DIAGNOSIS — J441 Chronic obstructive pulmonary disease with (acute) exacerbation: Secondary | ICD-10-CM | POA: Diagnosis not present

## 2021-05-27 DIAGNOSIS — J9622 Acute and chronic respiratory failure with hypercapnia: Secondary | ICD-10-CM

## 2021-05-27 DIAGNOSIS — C7931 Secondary malignant neoplasm of brain: Secondary | ICD-10-CM | POA: Diagnosis not present

## 2021-05-27 DIAGNOSIS — G9341 Metabolic encephalopathy: Secondary | ICD-10-CM | POA: Diagnosis not present

## 2021-05-27 DIAGNOSIS — Z515 Encounter for palliative care: Secondary | ICD-10-CM | POA: Diagnosis not present

## 2021-05-27 DIAGNOSIS — J181 Lobar pneumonia, unspecified organism: Secondary | ICD-10-CM | POA: Diagnosis not present

## 2021-05-27 DIAGNOSIS — Z7189 Other specified counseling: Secondary | ICD-10-CM | POA: Diagnosis not present

## 2021-05-27 DIAGNOSIS — J9621 Acute and chronic respiratory failure with hypoxia: Secondary | ICD-10-CM | POA: Diagnosis not present

## 2021-05-27 LAB — CBC
HCT: 27.5 % — ABNORMAL LOW (ref 36.0–46.0)
Hemoglobin: 9 g/dL — ABNORMAL LOW (ref 12.0–15.0)
MCH: 31.7 pg (ref 26.0–34.0)
MCHC: 32.7 g/dL (ref 30.0–36.0)
MCV: 96.8 fL (ref 80.0–100.0)
Platelets: 65 10*3/uL — ABNORMAL LOW (ref 150–400)
RBC: 2.84 MIL/uL — ABNORMAL LOW (ref 3.87–5.11)
RDW: 13.9 % (ref 11.5–15.5)
WBC: 4.9 10*3/uL (ref 4.0–10.5)
nRBC: 0.6 % — ABNORMAL HIGH (ref 0.0–0.2)

## 2021-05-27 LAB — COMPREHENSIVE METABOLIC PANEL
ALT: 49 U/L — ABNORMAL HIGH (ref 0–44)
AST: 44 U/L — ABNORMAL HIGH (ref 15–41)
Albumin: 1.9 g/dL — ABNORMAL LOW (ref 3.5–5.0)
Alkaline Phosphatase: 58 U/L (ref 38–126)
Anion gap: 6 (ref 5–15)
BUN: 16 mg/dL (ref 8–23)
CO2: 36 mmol/L — ABNORMAL HIGH (ref 22–32)
Calcium: 7.8 mg/dL — ABNORMAL LOW (ref 8.9–10.3)
Chloride: 94 mmol/L — ABNORMAL LOW (ref 98–111)
Creatinine, Ser: 0.32 mg/dL — ABNORMAL LOW (ref 0.44–1.00)
GFR, Estimated: >60 mL/min (ref >60)
Glucose, Bld: 178 mg/dL — ABNORMAL HIGH (ref 70–99)
Potassium: 2.6 mmol/L — CL (ref 3.5–5.1)
Sodium: 136 mmol/L (ref 135–145)
Total Bilirubin: 0.3 mg/dL (ref 0.3–1.2)
Total Protein: 5.5 g/dL — ABNORMAL LOW (ref 6.5–8.1)

## 2021-05-27 LAB — GLUCOSE, CAPILLARY
Glucose-Capillary: 34 mg/dL — CL (ref 70–99)
Glucose-Capillary: 143 mg/dL — ABNORMAL HIGH (ref 70–99)
Glucose-Capillary: 203 mg/dL — ABNORMAL HIGH (ref 70–99)
Glucose-Capillary: 254 mg/dL — ABNORMAL HIGH (ref 70–99)
Glucose-Capillary: 261 mg/dL — ABNORMAL HIGH (ref 70–99)
Glucose-Capillary: 165 mg/dL — ABNORMAL HIGH (ref 70–99)
Glucose-Capillary: 138 mg/dL — ABNORMAL HIGH (ref 70–99)

## 2021-05-27 LAB — URINE CULTURE: Culture: 20000 — AB

## 2021-05-27 LAB — MAGNESIUM: Magnesium: 2.1 mg/dL (ref 1.7–2.4)

## 2021-05-27 MED ORDER — POTASSIUM CHLORIDE 10 MEQ/100ML IV SOLN
10.0000 meq | INTRAVENOUS | Status: AC
  Administered 2021-05-27 (×2): 10 meq via INTRAVENOUS
  Filled 2021-05-27 (×2): qty 100

## 2021-05-27 MED ORDER — ALPRAZOLAM 0.5 MG PO TABS
0.5000 mg | ORAL_TABLET | Freq: Four times a day (QID) | ORAL | Status: DC | PRN
  Administered 2021-05-28 – 2021-06-01 (×12): 0.5 mg via ORAL
  Filled 2021-05-27 (×12): qty 1

## 2021-05-27 MED ORDER — OXYCODONE HCL 5 MG PO TABS
5.0000 mg | ORAL_TABLET | Freq: Once | ORAL | Status: AC
Start: 2021-05-27 — End: 2021-05-27
  Administered 2021-05-27: 5 mg via ORAL
  Filled 2021-05-27: qty 1

## 2021-05-27 MED ORDER — POTASSIUM CHLORIDE IN NACL 20-0.9 MEQ/L-% IV SOLN: INTRAVENOUS | Status: DC

## 2021-05-27 MED ORDER — OXYCODONE HCL 5 MG PO TABS
10.0000 mg | ORAL_TABLET | Freq: Four times a day (QID) | ORAL | Status: DC | PRN
  Administered 2021-05-27 – 2021-06-01 (×12): 10 mg via ORAL
  Filled 2021-05-27 (×12): qty 2

## 2021-05-27 MED ORDER — METHYLPREDNISOLONE SODIUM SUCC 125 MG IJ SOLR
60.0000 mg | Freq: Two times a day (BID) | INTRAMUSCULAR | Status: DC
  Administered 2021-05-27 – 2021-05-30 (×6): 60 mg via INTRAVENOUS
  Filled 2021-05-27 (×7): qty 2

## 2021-05-27 MED ORDER — POTASSIUM CHLORIDE CRYS ER 20 MEQ PO TBCR
40.0000 meq | EXTENDED_RELEASE_TABLET | Freq: Once | ORAL | Status: AC
  Administered 2021-05-27: 40 meq via ORAL
  Filled 2021-05-27: qty 2

## 2021-05-27 MED ORDER — BUDESONIDE 0.5 MG/2ML IN SUSP
0.5000 mg | Freq: Two times a day (BID) | RESPIRATORY_TRACT | Status: DC
  Administered 2021-05-27 – 2021-06-01 (×10): 0.5 mg via RESPIRATORY_TRACT
  Filled 2021-05-27 (×10): qty 2

## 2021-05-27 MED ORDER — DILTIAZEM HCL ER COATED BEADS 120 MG PO CP24
120.0000 mg | ORAL_CAPSULE | Freq: Every day | ORAL | Status: DC
  Administered 2021-05-28 – 2021-06-01 (×5): 120 mg via ORAL
  Filled 2021-05-27 (×5): qty 1

## 2021-05-27 NOTE — Progress Notes (Signed)
Received consult for patient to update or create AD today. Arrived and found patient awake with grandson bedside. Patient stated she was not interested in completing a LW or HPOA. She is hard of hearing but Chaplain spoke in her left ear and she understood what the document was. She politely declined saying, "I don't need that." Chaplain left the information bedside and will revisit with daughter the following day. Chaplain will remain available in order to provide spiritual support and to assess for spiritual need.

## 2021-05-27 NOTE — Progress Notes (Signed)
PROGRESS NOTE  Tammy Blair SEG:[id number] DOB: [birth year] DOA: 05/25/2021 PCP: [name], MD Brief History:  History per Dr. Arlyce Dice  70 y.o. 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 Patient was brought to the ED with multiple complaints which included decreased responsiveness.  History is obtained from patient's daughter [name] who is at bedside as at the time of y evaluation patient is altered and unable to answer questions appropriately. Daughter reports symptoms started 2 days ago, and patient has rapidly declined since then.  Has been sleeping more, confused, unable to get out of bed, with reported increasing difficulty breathing, onset of productive cough that started yesterday.  She also complained of lower abdominal pain.  Blood sugar was checked this morning and was 44, patient was brought to the ED.   ED Course: Temperature 100.8, heart rate 98-113, respiratory rate 17-26, lactic acid 1.7 >> 2.5 .  O2 sats 91 to 95% on 6 L.  WBC 8.9.  Blood glucose 38.  CT Abd/pelvis with contrast-without acute intra-abdominal findings, CT chest w contrast-shows new patchy infiltrate in the left upper and lower lobe which has a somewhat masslike appearance in the lower lobe but likely related to extensive pneumonia.  Also persistent right upper lobe mass slightly smaller than prior CT.  2.5 L sepsis fluid bolus given.  IV ceftriaxone and azithromycin started.  D50  50 mils given.  Hospitalist to admit     Assessment/Plan:   Severe Sepsis  -present on admission- tachycardic to 113, febrile to 100.8, tachypneic to 26, with evidence of endorgan dysfunction lactic acidosis of 2.5 and acute on chronic respiratory failure.   -secondary to lobar pneumonia -CT chest shows left upper and lower lobe patchy infiltrates.  COVID test negative -IV ceftriaxone and azithromycin  --continue -Follow-up blood and urine cultures -Trend lactic acid--peaked 2.5 -continue IVF -05/25/21 ABG--7.38/68/76/32 (0.44) -Patient is on chronic steroids--continue IV steroids -MRSA screen negative   Acute metabolic encephalopathy -secondary to sepsis, hypoglycemia in setting of metastatic brain cancer. -head CT--bilateral metastatic foci with interval improvement of vasogenic edema - mental status improving ,patient requesting her home benzos and narcotics- resumed.   Acute on chronic respiratory failure with hypercarbia and hypoxia -due to pneumonia and COPD exacerbation -O2 sats on my evaluation down to 89% on home 2-1/2 L,  -currently on 5.5 L maintaining sats greater than 93%. -wean oxygen back to baseline for saturation ?90%  COPD Exacerbation -start pulmicort -continue duonebs -start solumedrol IV  Hypoglycemia , controlled diabetes mellitus -blood glucose down to 25 in the ED > 121> 79 >111  status post 50 mils D50.   -from sepsis, relative adrenal insufficiencyand home insulins 70/30  30 units twice daily.  -04/08/21 A1c- 6.3. 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 Alvy Bimler.   -Completed palliative radiation therapy to the brain.   -Per last visit, patient is profoundly cushingoid and had classical signs of steroid-induced myopathy.  Steroid taper was started. -Palliative care consult -Hold steroid taper for now, and start stress dose steroids with hydrocortisone   Hypertension - Hold chlortalidone, while hydrating - Resumed Cardizem decreased dose   Hypokalemia -replete -mag 2.1   Goals of Care -DNR -palliative medicine consult  Status is: Inpatient   Remains inpatient appropriate because:Hemodynamically unstable   Dispo: The patient is from: Home              Anticipated d/c is to: Home              Patient currently is not medically stable to  d/c.              Difficult to place patient No               Family Communication:   no Family at bedside   Consultants:  palliative   Code Status:  DNR   DVT Prophylaxis:[place] Lovenox     Procedures: As Listed in Progress Note Above   Antibiotics:     Subjective: Patient continues to have sob with minimal exertion.  Denies f/c cp, n/v/d, abd pain, headache  Objective: Vitals:   05/27/21 0800 05/27/21 0900 05/27/21 1000 05/27/21 1100  BP: (!) 121/52  (!) 120/49 (!) 136/49  Pulse: 70 85 79 71  Resp: 18 20 (!) 21 13  Temp: (!) 97.2 F (36.2 C)     TempSrc: Oral     SpO2: 100% 97% 94% 93%  Weight:      Height:        Intake/Output Summary (Last 24 hours) at 05/27/2021 1410 Last data filed at 05/27/2021 0400 Gross per 24 hour  Intake 240 ml  Output 801 ml  Net -561 ml   Weight change:  Exam:  General:  Pt is alert, follows commands appropriately, not in acute distress HEENT: No icterus, No thrush, No neck mass, [place]/AT Cardiovascular: RRR, S1/S2, no rubs, no gallops Respiratory: bilateral rhonchi. Bilateral exp wheeze Abdomen: Soft/+BS, non tender, non distended, no guarding Extremities: No edema, No lymphangitis, No petechiae, No rashes, no synovitis   Data Reviewed: I have personally reviewed following labs and imaging studies Basic Metabolic Panel: Recent Labs  Lab 05/25/21 1107 05/26/21 0504 05/27/21 0413  NA 132* 134* 136  K 3.2* 3.2* 2.6*  CL 84* 93* 94*  CO2 36* 33* 36*  GLUCOSE 38* 105* 178*  BUN 26* 17 16  CREATININE 0.48 0.33* 0.32*  CALCIUM 8.6* 7.7* 7.8*  MG  --   --  2.1   Liver Function Tests: Recent Labs  Lab 05/25/21 1107 05/27/21 0413  AST 50* 44*  ALT 49* 49*  ALKPHOS 73 58  BILITOT 1.7* 0.3  PROT 6.8 5.5*  ALBUMIN 2.9* 1.9*   Recent Labs  Lab 05/25/21 1107  LIPASE 17   No results for input(s): AMMONIA in the last 168 hours. Coagulation Profile: Recent Labs  Lab 05/25/21 1107  INR 1.1   CBC: Recent Labs   Lab 05/25/21 1107 05/26/21 0504 05/27/21 0413  WBC 8.9 6.0 4.9  NEUTROABS 5.7  --   --   HGB 12.2 9.6* 9.0*  HCT 36.2 29.6* 27.5*  MCV 94.3 97.0 96.8  PLT 118* 73* 65*   Cardiac Enzymes: No results for input(s): CKTOTAL, CKMB, CKMBINDEX, TROPONINI in the last 168 hours. BNP: Invalid input(s): POCBNP CBG: Recent Labs  Lab 05/26/21 2312 05/27/21 0214 05/27/21 0727 05/27/21 1141 05/27/21 1142  GLUCAP 169* 143* 203* 261* 254*   HbA1C: No results for input(s): HGBA1C in the last 72 hours. Urine analysis:    Component Value Date/Time   COLORURINE YELLOW 05/25/2021 1403   APPEARANCEUR HAZY (A) 05/25/2021 1403   LABSPEC 1.013 05/25/2021 1403   PHURINE 6.0 05/25/2021 1403   GLUCOSEU 150 (A) 05/25/2021  [place] (A) 05/25/2021 1403   BILIRUBINUR NEGATIVE 05/25/2021 1403   [place] 05/25/2021 1403   PROTEINUR NEGATIVE 05/25/2021 1403   UROBILINOGEN 0.2 07/17/2014 1613   NITRITE NEGATIVE 05/25/2021 1403   LEUKOCYTESUR NEGATIVE 05/25/2021 1403   Sepsis Labs: @LABRCNTIP (procalcitonin:4,lacticidven:4) ) Recent Results (from the past 240 hour(s))  Resp Panel by RT-PCR (Flu A&B, Covid) Nasopharyngeal Swab     Status: None   Collection Time: 05/25/21 10:45 AM   Specimen: Nasopharyngeal Swab; Nasopharyngeal(NP) swabs in vial transport medium  Result Value Ref Range Status   SARS Coronavirus 2 by RT PCR NEGATIVE NEGATIVE Final    Comment: (NOTE) SARS-CoV-2 target nucleic acids are NOT DETECTED.  The SARS-CoV-2 RNA is generally detectable in upper respiratory specimens during the acute phase of infection. The lowest concentration of SARS-CoV-2 viral copies this assay can detect is 138 copies/mL. A negative result does not preclude SARS-Cov-2 infection and should not be used as the sole basis for treatment or other patient management decisions. A negative result may occur with  improper specimen collection/handling, submission of specimen other than  nasopharyngeal swab, presence of viral mutation(s) within the areas targeted by this assay, and inadequate number of viral copies(<138 copies/mL). A negative result must be combined with clinical observations, patient history, and epidemiological information. The expected result is Negative.  Fact Sheet for Patients:  EntrepreneurPulse.com.au  Fact Sheet for Healthcare Providers:  IncredibleEmployment.be  This test is no t yet approved or cleared by the [place] FDA and  has been authorized for detection and/or diagnosis of SARS-CoV-2 by FDA under an Emergency Use Authorization (EUA). This EUA will remain  in effect (meaning this test can be used) for the duration of the COVID-19 declaration under Section 564(b)(1) of the Act, 21 U.S.C.section 360bbb-3(b)(1), unless the authorization is terminated  or revoked sooner.       Influenza A by PCR NEGATIVE NEGATIVE Final   Influenza B by PCR NEGATIVE NEGATIVE Final    Comment: (NOTE) The Xpert Xpress SARS-CoV-2/FLU/RSV plus assay is intended as an aid in the diagnosis of influenza from Nasopharyngeal swab specimens and should not be used as a sole basis for treatment. Nasal washings and aspirates are unacceptable for Xpert Xpress SARS-CoV-2/FLU/RSV testing.  Fact Sheet for Patients: EntrepreneurPulse.com.au  Fact Sheet for Healthcare Providers: IncredibleEmployment.be  This test is not yet approved or cleared by the [place] FDA and has been authorized for detection and/or diagnosis of SARS-CoV-2 by FDA under an Emergency Use Authorization (EUA). This EUA will remain in effect (meaning this test can be used) for the duration of the COVID-19 declaration under Section 564(b)(1) of the Act, 21 U.S.C. section 360bbb-3(b)(1), unless the authorization is terminated or revoked.  Performed at [facility], [address], [place], [place] [address]   Blood  culture (routine single)     Status: None (Preliminary result)   Collection Time: 05/25/21 11:08 AM   Specimen: BLOOD  Result Value Ref Range Status   Specimen Description BLOOD RIGHT ANTECUBITAL  Final   Special Requests   Final    Blood Culture results may not be optimal due to an inadequate volume of blood received in culture bottles BOTTLES DRAWN AEROBIC AND ANAEROBIC   Culture   Final    NO GROWTH 2 DAYS Performed at [facility], [address] [place], [place] [address]    Report Status PENDING  Incomplete  Culture, blood (single)     Status: None (Preliminary result)   Collection  Time: 05/25/21 11:19 AM   Specimen: BLOOD  Result Value Ref Range Status   Specimen Description BLOOD LEFT ANTECUBITAL  Final   Special Requests   Final    Blood Culture adequate volume BOTTLES DRAWN AEROBIC AND ANAEROBIC   Culture   Final    NO GROWTH 2 DAYS Performed at [facility], [address], [place], [place] [address]    Report Status PENDING  Incomplete  Urine culture     Status: Abnormal   Collection Time: 05/25/21  2:04 PM   Specimen: Urine, Catheterized  Result Value Ref Range Status   Specimen Description   Final    URINE, CATHETERIZED Performed at [facility], [address] [place], [place] [address]    Special Requests   Final    NONE Performed at [facility], [address] [place], [place] [address]    Culture (A)  Final    20,000 COLONIES/mL STREPTOCOCCUS AGALACTIAE TESTING AGAINST S. AGALACTIAE NOT ROUTINELY PERFORMED DUE TO PREDICTABILITY OF AMP/PEN/VAN SUSCEPTIBILITY. Performed at [facility], [place] [address], [place], [place] [address]    Report Status 05/27/2021 FINAL  Final  MRSA Next Gen by PCR, Nasal     Status: None   Collection Time: 05/25/21  5:52 PM   Specimen: Nasal Mucosa; Nasal Swab  Result Value Ref Range Status   MRSA by PCR Next Gen NOT DETECTED NOT DETECTED Final    Comment: (NOTE) The GeneXpert MRSA Assay (FDA approved for NASAL specimens  only), is one component of a comprehensive MRSA colonization surveillance program. It is not intended to diagnose MRSA infection nor to guide or monitor treatment for MRSA infections. Test performance is not FDA approved in patients less than 59 years old. Performed at [facility], [address] [place],  [address]      Scheduled Meds:  Chlorhexidine Gluconate Cloth  6 each Topical Daily   [START ON 05/28/2021] diltiazem  120 mg Oral Daily   enoxaparin (LOVENOX) injection  40 mg Subcutaneous Q24H   hydrocortisone sod succinate (SOLU-CORTEF) inj  50 mg Intravenous Q12H   insulin aspart  0-20 Units Subcutaneous TID WC   insulin aspart  0-5 Units Subcutaneous QHS   ipratropium-albuterol  3 mL Nebulization Q6H   Continuous Infusions:  sodium chloride     0.9 % NaCl with KCl 20 mEq / L 75 mL/hr at 05/27/21 0817   azithromycin 500 mg (05/27/21 1335)   cefTRIAXone (ROCEPHIN)  IV 2 g (05/27/21 1138)    Procedures/Studies: CT HEAD WO CONTRAST  Result Date: 05/25/2021 CLINICAL DATA:  Altered mental status. History of lung cancer with brain metastasis. EXAM: CT HEAD WITHOUT CONTRAST TECHNIQUE: Contiguous axial images were obtained from the base of the skull through the vertex without intravenous contrast. COMPARISON:  CT head 04/07/2021.  MRI brain 04/08/2021 FINDINGS: Brain: Since the prior study, there is interval decrease of areas of vasogenic edema seen previously in the left parietal region, right parietal region, and right occipital region. This suggest response to interval therapy. Vague areas of slightly increased attenuation are demonstrated corresponding to known metastatic lesions in the parietal regions bilaterally. No mass-effect or midline shift. Mild ventricular dilatation. Gray-white matter junctions are mostly distinct. Basal cisterns are not effaced. No acute intracranial hemorrhage is identified. Vascular: Intracranial arterial vascular calcifications. Skull: Calvarium  appears intact. Sinuses/Orbits: Mild mucosal thickening in the paranasal sinuses. No acute air-fluid levels. Opacification of the mastoid air cells. Other: None. IMPRESSION: 1. Bilateral metastatic foci with interval  improvement of vasogenic edema since prior study. 2. No acute intracranial hemorrhage or significant mass effect. 3. Bilateral mastoid effusions. Electronically Signed   By: Lucienne Capers M.D.   On: 05/25/2021 17:20   CT Chest W Contrast  Result Date: 05/25/2021 CLINICAL DATA:  Known history of lung carcinoma with metastatic disease and abnormal chest x-ray, initial encounter EXAM: CT CHEST WITH CONTRAST TECHNIQUE: Multidetector CT imaging of the chest was performed during intravenous contrast administration. CONTRAST:  69mL OMNIPAQUE IOHEXOL 300 MG/ML  SOLN COMPARISON:  Chest x-ray from earlier in the same day, CT from 04/08/2021 FINDINGS: Cardiovascular: Atherosclerotic calcifications of the thoracic aorta are noted. No aneurysmal dilatation or dissection is noted. Heart is at the upper limits of normal in size. Mild coronary calcifications are noted. No definitive emboli are seen although timing was not performed for embolus evaluation. Mediastinum/Nodes: Thoracic inlet is within normal limits. Scattered mediastinal adenopathy is noted most prominent in the precarinal region measuring up to 2.8 cm slightly enlarged from the prior exam. Noted along the aortic arch measures 15 mm in short axis also slightly enlarged when compared with the prior exam. Bilateral hilar adenopathy is noted with a dominant 2.5 cm node stable in appearance in the right hilum. The esophagus as visualized is within normal limits. Right axillary adenopathy is noted slightly more prominent than that seen on the prior exam. Left-sided subpectoral adenopathy is noted. Lungs/Pleura: Lungs are well aerated bilaterally. The previously seen mass lesion along the major fissure is again identified with slightly reduced in size  measuring approximately 2.7 x 2.5 cm. It previously measured up to 3.7 cm. Patchy infiltrative density is noted in the right lower lobe somewhat limited in evaluation due to patient motion artifact. Patchy infiltrate is seen in the left upper lobe along the fissure as well as in the left lower lobe also new from the prior exam. No sizable effusion is seen. Upper Abdomen: Mild nodularity to the liver is noted similar to that seen on prior CT. No other focal abnormality in the upper abdomen is noted. Musculoskeletal: Degenerative changes of the thoracic spine are seen. No acute bony abnormality is noted. IMPRESSION: Persistent right upper lobe mass although slightly smaller than that seen on the prior CT. Mediastinal adenopathy which is slightly more prominent than that seen on the prior exam. Bilateral hilar adenopathy is noted as well. Subpectoral adenopathy in right axillary adenopathy is noted in slightly more prominent than that noted on the prior exam. New patchy infiltrate in the left upper and lower lobe which has a somewhat masslike appearance in the lower lobe but likely related to extensive pneumonia given the relative abrupt onset. Cirrhotic changes of the liver similar to that seen on prior exam. Aortic Atherosclerosis (ICD10-I70.0). Electronically Signed   By: Inez Catalina M.D.   On: 05/25/2021 15:11   CT ABDOMEN PELVIS W CONTRAST  Result Date: 05/25/2021 CLINICAL DATA:  70 year old female with history of abdominal pain and fever. History of lung cancer with metastatic disease to the brain, status post radiation therapy in May 2022. EXAM: CT ABDOMEN AND PELVIS WITH CONTRAST TECHNIQUE: Multidetector CT imaging of the abdomen and pelvis was performed using the standard protocol following bolus administration of intravenous contrast. CONTRAST:  150mL OMNIPAQUE IOHEXOL 300 MG/ML  SOLN COMPARISON:  CT the chest, abdomen and pelvis 04/08/2021. FINDINGS: Lower chest: There is soft tissue prominence in a  peribronchovascular distribution in the lung bases bilaterally (left greater than right). In addition, widespread areas of airspace consolidation  are noted in the lung bases bilaterally, most severe in the left lower lobe and inferior segment of the lingula. Atherosclerotic calcifications in the descending thoracic aorta as well as the right coronary artery. Hepatobiliary: Diffuse low attenuation throughout the hepatic parenchyma, indicative of a background of hepatic steatosis. Liver also has a shrunken appearance and nodular contour, indicative of underlying cirrhosis. No suspicious cystic or solid hepatic lesions. No intra or extrahepatic biliary ductal dilatation. Gallbladder is normal in appearance. Pancreas: No pancreatic mass. No pancreatic ductal dilatation. No pancreatic or peripancreatic fluid collections or inflammatory changes. Spleen: Unremarkable. Adrenals/Urinary Tract: Bilateral kidneys and adrenal glands are normal in appearance. No hydroureteronephrosis. Urinary bladder is normal in appearance. Stomach/Bowel: The appearance of the stomach is normal. There is no pathologic dilatation of small bowel or colon. Normal appendix. Vascular/Lymphatic: Aortic atherosclerosis, without evidence of aneurysm or dissection in the abdominal or pelvic vasculature. There is some fusiform ectasia of the distal infrarenal abdominal aorta which measures up to 2.5 x 2.2 cm in diameter shortly before the aortic bifurcation. No lymphadenopathy noted in the abdomen or pelvis. Reproductive: Uterus and ovaries are atrophic. Other: No significant volume of ascites.  No pneumoperitoneum. Musculoskeletal: There are no aggressive appearing lytic or blastic lesions noted in the visualized portions of the skeleton. IMPRESSION: 1. There are no acute findings noted in the abdomen or pelvis to account for the patient's symptoms. 2. However, the visualized portions of the lung bases are very abnormal. Although much of these findings  may relate to multilobar pneumonia, the profound soft tissue thickening in a peribronchovascular distribution in the lower lungs is very unusual in appearance and raises concern for potential lymphangitic spread of tumor. Further evaluation with contrast enhanced chest CT is recommended at this time to better evaluate the full extent of pulmonary disease. 3. Hepatic steatosis with hepatic cirrhosis. 4. Aortic atherosclerosis, in addition to at least right coronary artery disease. Please note that although the presence of coronary artery calcium documents the presence of coronary artery disease, the severity of this disease and any potential stenosis cannot be assessed on this non-gated CT examination. Assessment for potential risk factor modification, dietary therapy or pharmacologic therapy may be warranted, if clinically indicated. 5. Additional incidental findings, as above. Electronically Signed   By: Vinnie Langton M.D.   On: 05/25/2021 13:59   DG Chest Port 1 View  Result Date: 05/25/2021 CLINICAL DATA:  Shortness of breath. EXAM: PORTABLE CHEST 1 VIEW COMPARISON:  04/07/2021 FINDINGS: Stable heart size. Redemonstrated right upper lobe lung mass measuring approximately 3.8 cm in diameter, stable to minimally progressed in size from prior. New patchy consolidation within the left mid to lower lung field. No pleural effusion or pneumothorax. IMPRESSION: 1. New patchy consolidation within the left mid to lower lung field concerning for pneumonia. 2. Stable to minimally progressed size of known right upper lobe lung mass. Electronically Signed   By: Davina Poke D.O.   On: 05/25/2021 11:04    Orson Eva, DO  [facility]  If 7PM-7AM, please contact night-coverage [email or web address] Password [email or web address] 05/27/2021, 2:10 PM   LOS: 2 days

## 2021-05-27 NOTE — Progress Notes (Signed)
Palliative:  HPI: 70 y.o. female  with past medical history of lung cancer with brain mets, COPD on 2.5L oxygen, hypertension, diabetes admitted on 05/25/2021 with shortness of breath, abd pain, shortness of breath and found to have sepsis pneumonia.   I met today at Tammy Blair bedside (she slept throughout my visit) with daughter, Tammy Blair. [name] and I have a good talk about Ms. Mares underlying cancer and her severe pneumonia. Discussed that she is very ill and it is difficult to know how she will progress. We discussed that her body will need to be strong enough to use the antibiotics and interventions we are providing. We discussed that she could potentially get worse instead of better. Tammy Blair is hopeful that she will improve from pneumonia but we did discuss that if she were to have significant decline towards end of life we would make sure that family would be able to be with her and visit with her. Tammy Blair is glad to know this would be possible. [name] does not want her mother to suffer.   [name] and I discuss also if her mother is able to have some improvement from pneumonia. Tammy Blair says that she wants to take her mother home and knows she would never want to go to a nursing home or to hospice. [name] also verifies that her mother does not wish to pursue cancer treatment if she needs port placed. Tammy Blair exhibits interested in obtaining hospice services in the home to help care for her mother at home but would like to help her to improve as much as possible first.   All questions/concerns addressed. Emotional support provided.   Exam: Lethargic. I did not awaken as I cannot communicate well with her due to hard of hearing. No distress. 7L oxygen sats mid 90s. Breathing regular, unlabored. Abd flat.   Plan: - Continue to optimize with hopes of improvement in pneumonia.  - Daughter is interested in considering hospice services in the home at time of discharge. She understands that hospice is  an option IF no plans for treatment of cancer.   [place], NP Palliative Medicine Team Pager [phone] (Please see [email or web address] for schedule) Team Phone [phone]    Greater than 50%  of this time was spent counseling and coordinating care related to the above assessment and plan

## 2021-05-28 ENCOUNTER — Encounter (HOSPITAL_COMMUNITY): Payer: Self-pay | Admitting: Hematology and Oncology

## 2021-05-28 DIAGNOSIS — J441 Chronic obstructive pulmonary disease with (acute) exacerbation: Secondary | ICD-10-CM | POA: Diagnosis not present

## 2021-05-28 DIAGNOSIS — G9341 Metabolic encephalopathy: Secondary | ICD-10-CM | POA: Diagnosis not present

## 2021-05-28 DIAGNOSIS — J9621 Acute and chronic respiratory failure with hypoxia: Secondary | ICD-10-CM | POA: Diagnosis not present

## 2021-05-28 DIAGNOSIS — J181 Lobar pneumonia, unspecified organism: Secondary | ICD-10-CM | POA: Diagnosis not present

## 2021-05-28 DIAGNOSIS — A419 Sepsis, unspecified organism: Secondary | ICD-10-CM | POA: Diagnosis not present

## 2021-05-28 LAB — CBC
HCT: 30 % — ABNORMAL LOW (ref 36.0–46.0)
Hemoglobin: 9.5 g/dL — ABNORMAL LOW (ref 12.0–15.0)
MCH: 31.1 pg (ref 26.0–34.0)
MCHC: 31.7 g/dL (ref 30.0–36.0)
MCV: 98.4 fL (ref 80.0–100.0)
Platelets: 80 10*3/uL — ABNORMAL LOW (ref 150–400)
RBC: 3.05 MIL/uL — ABNORMAL LOW (ref 3.87–5.11)
RDW: 14.4 % (ref 11.5–15.5)
WBC: 4.5 10*3/uL (ref 4.0–10.5)
nRBC: 0.7 % — ABNORMAL HIGH (ref 0.0–0.2)

## 2021-05-28 LAB — BASIC METABOLIC PANEL
Anion gap: 7 (ref 5–15)
BUN: 17 mg/dL (ref 8–23)
CO2: 37 mmol/L — ABNORMAL HIGH (ref 22–32)
Calcium: 8 mg/dL — ABNORMAL LOW (ref 8.9–10.3)
Chloride: 94 mmol/L — ABNORMAL LOW (ref 98–111)
Creatinine, Ser: 0.31 mg/dL — ABNORMAL LOW (ref 0.44–1.00)
GFR, Estimated: >60 mL/min (ref >60)
Glucose, Bld: 197 mg/dL — ABNORMAL HIGH (ref 70–99)
Potassium: 3.6 mmol/L (ref 3.5–5.1)
Sodium: 138 mmol/L (ref 135–145)

## 2021-05-28 LAB — PROCALCITONIN: Procalcitonin: 0.74 ng/mL

## 2021-05-28 LAB — GLUCOSE, CAPILLARY
Glucose-Capillary: 181 mg/dL — ABNORMAL HIGH (ref 70–99)
Glucose-Capillary: 240 mg/dL — ABNORMAL HIGH (ref 70–99)
Glucose-Capillary: 295 mg/dL — ABNORMAL HIGH (ref 70–99)
Glucose-Capillary: 370 mg/dL — ABNORMAL HIGH (ref 70–99)
Glucose-Capillary: 242 mg/dL — ABNORMAL HIGH (ref 70–99)

## 2021-05-28 LAB — MAGNESIUM: Magnesium: 2 mg/dL (ref 1.7–2.4)

## 2021-05-28 MED ORDER — ARFORMOTEROL TARTRATE 15 MCG/2ML IN NEBU
15.0000 ug | INHALATION_SOLUTION | Freq: Two times a day (BID) | RESPIRATORY_TRACT | Status: DC
  Administered 2021-05-28 – 2021-05-31 (×7): 15 ug via RESPIRATORY_TRACT
  Filled 2021-05-28 (×7): qty 2

## 2021-05-28 NOTE — Progress Notes (Signed)
PROGRESS NOTE  CHRISTINAMARIE TALL CZY:[id number] DOB: 1951-05-30 DOA: 05/25/2021 PCP: [name], MD  Brief History:  History per Dr. Arlyce Dice  70 y.o. 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 Patient was brought to the ED with multiple complaints which included decreased responsiveness.  History is obtained from patient's daughter [name] who is at bedside as at the time of y evaluation patient is altered and unable to answer questions appropriately. Daughter reports symptoms started 2 days ago, and patient has rapidly declined since then.  Has been sleeping more, confused, unable to get out of bed, with reported increasing difficulty breathing, onset of productive cough that started yesterday.  She also complained of lower abdominal pain.  Blood sugar was checked this morning and was 44, patient was brought to the ED.   ED Course: Temperature 100.8, heart rate 98-113, respiratory rate 17-26, lactic acid 1.7 >> 2.5 .  O2 sats 91 to 95% on 6 L.  WBC 8.9.  Blood glucose 38.  CT Abd/pelvis with contrast-without acute intra-abdominal findings, CT chest w contrast-shows new patchy infiltrate in the left upper and lower lobe which has a somewhat masslike appearance in the lower lobe but likely related to extensive pneumonia.  Also persistent right upper lobe mass slightly smaller than prior CT.  2.5 L sepsis fluid bolus given.  IV ceftriaxone and azithromycin started.  D50  50 mils given.  Hospitalist to admit     Assessment/Plan:   Severe Sepsis  -present on admission- tachycardic to 113, febrile to 100.8, tachypneic to 26, with evidence of endorgan dysfunction lactic acidosis of 2.5 and acute on chronic respiratory failure.   -secondary to lobar pneumonia -CT chest shows left upper and lower lobe patchy infiltrates.  -COVID test negative -IV ceftriaxone and azithromycin  --continue -Follow-up blood and urine cultures -Trend lactic acid--peaked 2.5 -continue IVF>>saline lock -05/25/21 ABG--7.38/68/76/32 (0.44) -Patient is on chronic steroids--continue IV steroids -MRSA screen negative -PCT 0.74 -repeat CXR in am   Acute metabolic encephalopathy -secondary to sepsis, hypoglycemia in setting of metastatic brain cancer. -head CT--bilateral metastatic foci with interval improvement of vasogenic edema - mental status improving ,patient requesting her home benzos and narcotics- resumed.    Acute on chronic respiratory failure with hypercarbia and hypoxia -due to pneumonia and COPD exacerbation -O2 sats on my evaluation down to 89% on home 2-1/2 L,  -currently on 7 L maintaining sats greater than 93%. -wean oxygen back to baseline for saturation >90% -repeat CXR   COPD Exacerbation -continue pulmicort -continue duonebs -continue solumedrol IV -add brovana   Hypoglycemia , controlled diabetes mellitus -blood glucose down to 25 in the ED > 121> 79 >111  status post 50 mils D50.   -hypoglycemia resolved on steroids -from sepsis, relative adrenal insufficiencyand home insulins 70/30  30 units twice daily.  -04/08/21 A1c- 6.3. 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 Alvy Bimler.   -Completed palliative radiation therapy to the brain.   -Per last visit, patient is profoundly cushingoid and had classical signs of steroid-induced myopathy.  Steroid taper was started. -Palliative care consult -Hold steroid taper for now, and start stress dose steroids with hydrocortisone   Hypertension - Hold chlortalidone due to soft BPs initially - Resumed Cardizem decreased dose   Hypokalemia -replete -mag  2.1   Goals of Care -DNR -palliative medicine following             Status is: Inpatient   Remains inpatient appropriate because:Hemodynamically unstable   Dispo: The  patient is from: Home              Anticipated d/c is to: Home              Patient currently is not medically stable to d/c.              Difficult to place patient No               Family Communication:   sister/niece updated 6/17   Consultants:  palliative   Code Status:  DNR   DVT Prophylaxis:[place] Lovenox     Procedures: As Listed in Progress Note Above   Antibiotics: Ceftriaxone 6/14>> Azithro 6/14>>      Subjective: Patient denies fevers, chills, headache, chest pain, dyspnea, nausea, vomiting, diarrhea, abdominal pain,    Objective: Vitals:   05/28/21 1200 05/28/21 1300 05/28/21 1400 05/28/21 1430  BP: 135/68 (!) 142/65 (!) 152/59   Pulse: 73 87 79   Resp: 19 (!) 22 14   Temp: 98 F (36.7 C)     TempSrc: Oral     SpO2: 97% 97% 96% 97%  Weight:      Height:        Intake/Output Summary (Last 24 hours) at 05/28/2021 1531 Last data filed at 05/28/2021 1400 Gross per 24 hour  Intake 1014.44 ml  Output 900 ml  Net 114.44 ml   Weight change:  Exam:  General:  Pt is alert, follows commands appropriately, not in acute distress HEENT: No icterus, No thrush, No neck mass, [place]/AT Cardiovascular: RRR, S1/S2, no rubs, no gallops Respiratory: bilateral rhonchi.  Bibasilar wheeze Abdomen: Soft/+BS, non tender, non distended, no guarding Extremities: 1 + LE edema, No lymphangitis, No petechiae, No rashes, no synovitis   Data Reviewed: I have personally reviewed following labs and imaging studies Basic Metabolic Panel: Recent Labs  Lab 05/25/21 1107 05/26/21 0504 05/27/21 0413 05/28/21 0518  NA 132* 134* 136 138  K 3.2* 3.2* 2.6* 3.6  CL 84* 93* 94* 94*  CO2 36* 33* 36* 37*  GLUCOSE 38* 105* 178* 197*  BUN 26* 17 16 17   CREATININE 0.48 0.33* 0.32* 0.31*  CALCIUM 8.6* 7.7* 7.8* 8.0*  MG  --   --  2.1 2.0   Liver Function Tests: Recent Labs  Lab 05/25/21 1107 05/27/21 0413  AST 50* 44*  ALT 49* 49*  ALKPHOS 73 58  BILITOT 1.7* 0.3  PROT 6.8  5.5*  ALBUMIN 2.9* 1.9*   Recent Labs  Lab 05/25/21 1107  LIPASE 17   No results for input(s): AMMONIA in the last 168 hours. Coagulation Profile: Recent Labs  Lab 05/25/21 1107  INR 1.1   CBC: Recent Labs  Lab 05/25/21 1107 05/26/21 0504 05/27/21 0413 05/28/21 0518  WBC 8.9 6.0 4.9 4.5  NEUTROABS 5.7  --   --   --   HGB 12.2 9.6* 9.0* 9.5*  HCT 36.2 29.6* 27.5* 30.0*  MCV 94.3 97.0 96.8 98.4  PLT 118* 73* 65* 80*   Cardiac Enzymes: No results for input(s): CKTOTAL, CKMB, CKMBINDEX, TROPONINI in the last 168 hours. BNP: Invalid input(s): POCBNP CBG: Recent Labs  Lab 05/27/21 1651 05/27/21 2008 05/28/21 0505 05/28/21 0717 05/28/21 1121  GLUCAP 165* 138* 181* 240* 295*   HbA1C: No results for  input(s): HGBA1C in the last 72 hours. Urine analysis:    Component Value Date/Time   COLORURINE YELLOW 05/25/2021 1403   APPEARANCEUR HAZY (A) 05/25/2021 1403   LABSPEC 1.013 05/25/2021 1403   PHURINE 6.0 05/25/2021 1403   GLUCOSEU 150 (A) 05/25/2021 1403   HGBUR MODERATE (A) 05/25/2021 1403   BILIRUBINUR NEGATIVE 05/25/2021 1403   KETONESUR NEGATIVE 05/25/2021 1403   PROTEINUR NEGATIVE 05/25/2021 1403   UROBILINOGEN 0.2 07/17/2014 1613   NITRITE NEGATIVE 05/25/2021 1403   LEUKOCYTESUR NEGATIVE 05/25/2021 1403   Sepsis Labs: @LABRCNTIP (procalcitonin:4,lacticidven:4) ) Recent Results (from the past 240 hour(s))  Resp Panel by RT-PCR (Flu A&B, Covid) Nasopharyngeal Swab     Status: None   Collection Time: 05/25/21 10:45 AM   Specimen: Nasopharyngeal Swab; Nasopharyngeal(NP) swabs in vial transport medium  Result Value Ref Range Status   SARS Coronavirus 2 by RT PCR NEGATIVE NEGATIVE Final    Comment: (NOTE) SARS-CoV-2 target nucleic acids are NOT DETECTED.  The SARS-CoV-2 RNA is generally detectable in upper respiratory specimens during the acute phase of infection. The lowest concentration of SARS-CoV-2 viral copies this assay can detect is 138 copies/mL.  A negative result does not preclude SARS-Cov-2 infection and should not be used as the sole basis for treatment or other patient management decisions. A negative result may occur with  improper specimen collection/handling, submission of specimen other than nasopharyngeal swab, presence of viral mutation(s) within the areas targeted by this assay, and inadequate number of viral copies(<138 copies/mL). A negative result must be combined with clinical observations, patient history, and epidemiological information. The expected result is Negative.  Fact Sheet for Patients:  EntrepreneurPulse.com.au  Fact Sheet for Healthcare Providers:  IncredibleEmployment.be  This test is no t yet approved or cleared by the [place] FDA and  has been authorized for detection and/or diagnosis of SARS-CoV-2 by FDA under an Emergency Use Authorization (EUA). This EUA will remain  in effect (meaning this test can be used) for the duration of the COVID-19 declaration under Section 564(b)(1) of the Act, 21 U.S.C.section 360bbb-3(b)(1), unless the authorization is terminated  or revoked sooner.       Influenza A by PCR NEGATIVE NEGATIVE Final   Influenza B by PCR NEGATIVE NEGATIVE Final    Comment: (NOTE) The Xpert Xpress SARS-CoV-2/FLU/RSV plus assay is intended as an aid in the diagnosis of influenza from Nasopharyngeal swab specimens and should not be used as a sole basis for treatment. Nasal washings and aspirates are unacceptable for Xpert Xpress SARS-CoV-2/FLU/RSV testing.  Fact Sheet for Patients: EntrepreneurPulse.com.au  Fact Sheet for Healthcare Providers: IncredibleEmployment.be  This test is not yet approved or cleared by the [place] FDA and has been authorized for detection and/or diagnosis of SARS-CoV-2 by FDA under an Emergency Use Authorization (EUA). This EUA will remain in effect (meaning this test  can be used) for the duration of the COVID-19 declaration under Section 564(b)(1) of the Act, 21 U.S.C. section 360bbb-3(b)(1), unless the authorization is terminated or revoked.  Performed at [facility], [address] [place], [place] [address]   Blood culture (routine single)     Status: None (Preliminary result)   Collection Time: 05/25/21 11:08 AM   Specimen: BLOOD  Result Value Ref Range Status   Specimen Description BLOOD RIGHT ANTECUBITAL  Final   Special Requests   Final    Blood Culture results may not be optimal due to an inadequate volume of blood received in culture bottles BOTTLES DRAWN AEROBIC AND ANAEROBIC  Culture   Final    NO GROWTH 3 DAYS Performed at [facility], [address], [place], [place] [address]    Report Status PENDING  Incomplete  Culture, blood (single)     Status: None (Preliminary result)   Collection Time: 05/25/21 11:19 AM   Specimen: BLOOD  Result Value Ref Range Status   Specimen Description BLOOD LEFT ANTECUBITAL  Final   Special Requests   Final    Blood Culture adequate volume BOTTLES DRAWN AEROBIC AND ANAEROBIC   Culture   Final    NO GROWTH 3 DAYS Performed at [facility], [address] [place], [place] [address]    Report Status PENDING  Incomplete  Urine culture     Status: Abnormal   Collection Time: 05/25/21  2:04 PM   Specimen: Urine, Catheterized  Result Value Ref Range Status   Specimen Description   Final    URINE, CATHETERIZED Performed at [facility], [address] [place], [place] [address]    Special Requests   Final    NONE Performed at [facility], [address] [place], [place] [address]    Culture (A)  Final    20,000 COLONIES/mL STREPTOCOCCUS AGALACTIAE TESTING AGAINST S. AGALACTIAE NOT ROUTINELY PERFORMED DUE TO PREDICTABILITY OF AMP/PEN/VAN SUSCEPTIBILITY. Performed at [facility], [place] [address], [place], [place] [address]    Report Status 05/27/2021 FINAL  Final  MRSA Next Gen by PCR,  Nasal     Status: None   Collection Time: 05/25/21  5:52 PM   Specimen: Nasal Mucosa; Nasal Swab  Result Value Ref Range Status   MRSA by PCR Next Gen NOT DETECTED NOT DETECTED Final    Comment: (NOTE) The GeneXpert MRSA Assay (FDA approved for NASAL specimens only), is one component of a comprehensive MRSA colonization surveillance program. It is not intended to diagnose MRSA infection nor to guide or monitor treatment for MRSA infections. Test performance is not FDA approved in patients less than 35 years old. Performed at [facility], [address], [place], [place] [address]      Scheduled Meds:  arformoterol  15 mcg Nebulization BID   budesonide (PULMICORT) nebulizer solution  0.5 mg Nebulization BID   Chlorhexidine Gluconate Cloth  6 each Topical Daily   diltiazem  120 mg Oral Daily   enoxaparin (LOVENOX) injection  40 mg Subcutaneous Q24H   insulin aspart  0-20 Units Subcutaneous TID WC   insulin aspart  0-5 Units Subcutaneous QHS   ipratropium-albuterol  3 mL Nebulization Q6H   methylPREDNISolone (SOLU-MEDROL) injection  60 mg Intravenous Q12H   Continuous Infusions:  sodium chloride     azithromycin 500 mg (05/28/21 1257)   cefTRIAXone (ROCEPHIN)  IV 2 g (05/28/21 1155)    Procedures/Studies: CT HEAD WO CONTRAST  Result Date: 05/25/2021 CLINICAL DATA:  Altered mental status. History of lung cancer with brain metastasis. EXAM: CT HEAD WITHOUT CONTRAST TECHNIQUE: Contiguous axial images were obtained from the base of the skull through the vertex without intravenous contrast. COMPARISON:  CT head 04/07/2021.  MRI brain 04/08/2021 FINDINGS: Brain: Since the prior study, there is interval decrease of areas of vasogenic edema seen previously in the left parietal region, right parietal region, and right occipital region. This suggest response to interval therapy. Vague areas of slightly increased attenuation are demonstrated corresponding to known metastatic lesions in the  parietal regions bilaterally. No mass-effect or midline shift. Mild ventricular dilatation. Gray-white matter junctions are mostly distinct. Basal cisterns are not  effaced. No acute intracranial hemorrhage is identified. Vascular: Intracranial arterial vascular calcifications. Skull: Calvarium appears intact. Sinuses/Orbits: Mild mucosal thickening in the paranasal sinuses. No acute air-fluid levels. Opacification of the mastoid air cells. Other: None. IMPRESSION: 1. Bilateral metastatic foci with interval improvement of vasogenic edema since prior study. 2. No acute intracranial hemorrhage or significant mass effect. 3. Bilateral mastoid effusions. Electronically Signed   By: Lucienne Capers M.D.   On: 05/25/2021 17:20   CT Chest W Contrast  Result Date: 05/25/2021 CLINICAL DATA:  Known history of lung carcinoma with metastatic disease and abnormal chest x-ray, initial encounter EXAM: CT CHEST WITH CONTRAST TECHNIQUE: Multidetector CT imaging of the chest was performed during intravenous contrast administration. CONTRAST:  55mL OMNIPAQUE IOHEXOL 300 MG/ML  SOLN COMPARISON:  Chest x-ray from earlier in the same day, CT from 04/08/2021 FINDINGS: Cardiovascular: Atherosclerotic calcifications of the thoracic aorta are noted. No aneurysmal dilatation or dissection is noted. Heart is at the upper limits of normal in size. Mild coronary calcifications are noted. No definitive emboli are seen although timing was not performed for embolus evaluation. Mediastinum/Nodes: Thoracic inlet is within normal limits. Scattered mediastinal adenopathy is noted most prominent in the precarinal region measuring up to 2.8 cm slightly enlarged from the prior exam. Noted along the aortic arch measures 15 mm in short axis also slightly enlarged when compared with the prior exam. Bilateral hilar adenopathy is noted with a dominant 2.5 cm node stable in appearance in the right hilum. The esophagus as visualized is within normal limits.  Right axillary adenopathy is noted slightly more prominent than that seen on the prior exam. Left-sided subpectoral adenopathy is noted. Lungs/Pleura: Lungs are well aerated bilaterally. The previously seen mass lesion along the major fissure is again identified with slightly reduced in size measuring approximately 2.7 x 2.5 cm. It previously measured up to 3.7 cm. Patchy infiltrative density is noted in the right lower lobe somewhat limited in evaluation due to patient motion artifact. Patchy infiltrate is seen in the left upper lobe along the fissure as well as in the left lower lobe also new from the prior exam. No sizable effusion is seen. Upper Abdomen: Mild nodularity to the liver is noted similar to that seen on prior CT. No other focal abnormality in the upper abdomen is noted. Musculoskeletal: Degenerative changes of the thoracic spine are seen. No acute bony abnormality is noted. IMPRESSION: Persistent right upper lobe mass although slightly smaller than that seen on the prior CT. Mediastinal adenopathy which is slightly more prominent than that seen on the prior exam. Bilateral hilar adenopathy is noted as well. Subpectoral adenopathy in right axillary adenopathy is noted in slightly more prominent than that noted on the prior exam. New patchy infiltrate in the left upper and lower lobe which has a somewhat masslike appearance in the lower lobe but likely related to extensive pneumonia given the relative abrupt onset. Cirrhotic changes of the liver similar to that seen on prior exam. Aortic Atherosclerosis (ICD10-I70.0). Electronically Signed   By: Inez Catalina M.D.   On: 05/25/2021 15:11   CT ABDOMEN PELVIS W CONTRAST  Result Date: 05/25/2021 CLINICAL DATA:  70 year old female with history of abdominal pain and fever. History of lung cancer with metastatic disease to the brain, status post radiation therapy in May 2022. EXAM: CT ABDOMEN AND PELVIS WITH CONTRAST TECHNIQUE: Multidetector CT imaging of  the abdomen and pelvis was performed using the standard protocol following bolus administration of intravenous contrast. CONTRAST:  191mL  OMNIPAQUE IOHEXOL 300 MG/ML  SOLN COMPARISON:  CT the chest, abdomen and pelvis 04/08/2021. FINDINGS: Lower chest: There is soft tissue prominence in a peribronchovascular distribution in the lung bases bilaterally (left greater than right). In addition, widespread areas of airspace consolidation are noted in the lung bases bilaterally, most severe in the left lower lobe and inferior segment of the lingula. Atherosclerotic calcifications in the descending thoracic aorta as well as the right coronary artery. Hepatobiliary: Diffuse low attenuation throughout the hepatic parenchyma, indicative of a background of hepatic steatosis. Liver also has a shrunken appearance and nodular contour, indicative of underlying cirrhosis. No suspicious cystic or solid hepatic lesions. No intra or extrahepatic biliary ductal dilatation. Gallbladder is normal in appearance. Pancreas: No pancreatic mass. No pancreatic ductal dilatation. No pancreatic or peripancreatic fluid collections or inflammatory changes. Spleen: Unremarkable. Adrenals/Urinary Tract: Bilateral kidneys and adrenal glands are normal in appearance. No hydroureteronephrosis. Urinary bladder is normal in appearance. Stomach/Bowel: The appearance of the stomach is normal. There is no pathologic dilatation of small bowel or colon. Normal appendix. Vascular/Lymphatic: Aortic atherosclerosis, without evidence of aneurysm or dissection in the abdominal or pelvic vasculature. There is some fusiform ectasia of the distal infrarenal abdominal aorta which measures up to 2.5 x 2.2 cm in diameter shortly before the aortic bifurcation. No lymphadenopathy noted in the abdomen or pelvis. Reproductive: Uterus and ovaries are atrophic. Other: No significant volume of ascites.  No pneumoperitoneum. Musculoskeletal: There are no aggressive appearing  lytic or blastic lesions noted in the visualized portions of the skeleton. IMPRESSION: 1. There are no acute findings noted in the abdomen or pelvis to account for the patient's symptoms. 2. However, the visualized portions of the lung bases are very abnormal. Although much of these findings may relate to multilobar pneumonia, the profound soft tissue thickening in a peribronchovascular distribution in the lower lungs is very unusual in appearance and raises concern for potential lymphangitic spread of tumor. Further evaluation with contrast enhanced chest CT is recommended at this time to better evaluate the full extent of pulmonary disease. 3. Hepatic steatosis with hepatic cirrhosis. 4. Aortic atherosclerosis, in addition to at least right coronary artery disease. Please note that although the presence of coronary artery calcium documents the presence of coronary artery disease, the severity of this disease and any potential stenosis cannot be assessed on this non-gated CT examination. Assessment for potential risk factor modification, dietary therapy or pharmacologic therapy may be warranted, if clinically indicated. 5. Additional incidental findings, as above. Electronically Signed   By: Vinnie Langton M.D.   On: 05/25/2021 13:59   DG Chest Port 1 View  Result Date: 05/25/2021 CLINICAL DATA:  Shortness of breath. EXAM: PORTABLE CHEST 1 VIEW COMPARISON:  04/07/2021 FINDINGS: Stable heart size. Redemonstrated right upper lobe lung mass measuring approximately 3.8 cm in diameter, stable to minimally progressed in size from prior. New patchy consolidation within the left mid to lower lung field. No pleural effusion or pneumothorax. IMPRESSION: 1. New patchy consolidation within the left mid to lower lung field concerning for pneumonia. 2. Stable to minimally progressed size of known right upper lobe lung mass. Electronically Signed   By: Davina Poke D.O.   On: 05/25/2021 11:04    Orson Eva, DO  [facility]  If 7PM-7AM, please contact night-coverage [email or web address] Password [email or web address] 05/28/2021, 3:31 PM   LOS: 3 days

## 2021-05-29 ENCOUNTER — Inpatient Hospital Stay (HOSPITAL_COMMUNITY): Payer: Medicare Other

## 2021-05-29 DIAGNOSIS — J9621 Acute and chronic respiratory failure with hypoxia: Secondary | ICD-10-CM | POA: Diagnosis not present

## 2021-05-29 DIAGNOSIS — A419 Sepsis, unspecified organism: Secondary | ICD-10-CM | POA: Diagnosis not present

## 2021-05-29 DIAGNOSIS — J181 Lobar pneumonia, unspecified organism: Secondary | ICD-10-CM | POA: Diagnosis not present

## 2021-05-29 DIAGNOSIS — G9341 Metabolic encephalopathy: Secondary | ICD-10-CM | POA: Diagnosis not present

## 2021-05-29 DIAGNOSIS — J441 Chronic obstructive pulmonary disease with (acute) exacerbation: Secondary | ICD-10-CM | POA: Diagnosis not present

## 2021-05-29 LAB — MAGNESIUM: Magnesium: 2.2 mg/dL (ref 1.7–2.4)

## 2021-05-29 LAB — GLUCOSE, CAPILLARY
Glucose-Capillary: 210 mg/dL — ABNORMAL HIGH (ref 70–99)
Glucose-Capillary: 141 mg/dL — ABNORMAL HIGH (ref 70–99)
Glucose-Capillary: 286 mg/dL — ABNORMAL HIGH (ref 70–99)
Glucose-Capillary: 279 mg/dL — ABNORMAL HIGH (ref 70–99)

## 2021-05-29 LAB — BASIC METABOLIC PANEL
Anion gap: 7 (ref 5–15)
BUN: 19 mg/dL (ref 8–23)
CO2: 40 mmol/L — ABNORMAL HIGH (ref 22–32)
Calcium: 8 mg/dL — ABNORMAL LOW (ref 8.9–10.3)
Chloride: 89 mmol/L — ABNORMAL LOW (ref 98–111)
Creatinine, Ser: 0.3 mg/dL — ABNORMAL LOW (ref 0.44–1.00)
GFR, Estimated: >60 mL/min (ref >60)
Glucose, Bld: 192 mg/dL — ABNORMAL HIGH (ref 70–99)
Potassium: 3.4 mmol/L — ABNORMAL LOW (ref 3.5–5.1)
Sodium: 136 mmol/L (ref 135–145)

## 2021-05-29 LAB — CBC
HCT: 29.4 % — ABNORMAL LOW (ref 36.0–46.0)
Hemoglobin: 9.7 g/dL — ABNORMAL LOW (ref 12.0–15.0)
MCH: 32.1 pg (ref 26.0–34.0)
MCHC: 33 g/dL (ref 30.0–36.0)
MCV: 97.4 fL (ref 80.0–100.0)
Platelets: 94 10*3/uL — ABNORMAL LOW (ref 150–400)
RBC: 3.02 MIL/uL — ABNORMAL LOW (ref 3.87–5.11)
RDW: 14.1 % (ref 11.5–15.5)
WBC: 6.8 10*3/uL (ref 4.0–10.5)
nRBC: 0.6 % — ABNORMAL HIGH (ref 0.0–0.2)

## 2021-05-29 LAB — BRAIN NATRIURETIC PEPTIDE: B Natriuretic Peptide: 159 pg/mL — ABNORMAL HIGH (ref 0.0–100.0)

## 2021-05-29 LAB — PROCALCITONIN: Procalcitonin: 0.47 ng/mL

## 2021-05-29 IMAGING — DX DG CHEST 1V PORT
1 series · 1 of 1 positions shown · non-contrast
Comparison: [DATE] CT and chest radiograph

CLINICAL DATA: COPD exacerbation.

EXAM:
PORTABLE CHEST 1 VIEW

[chest ap]
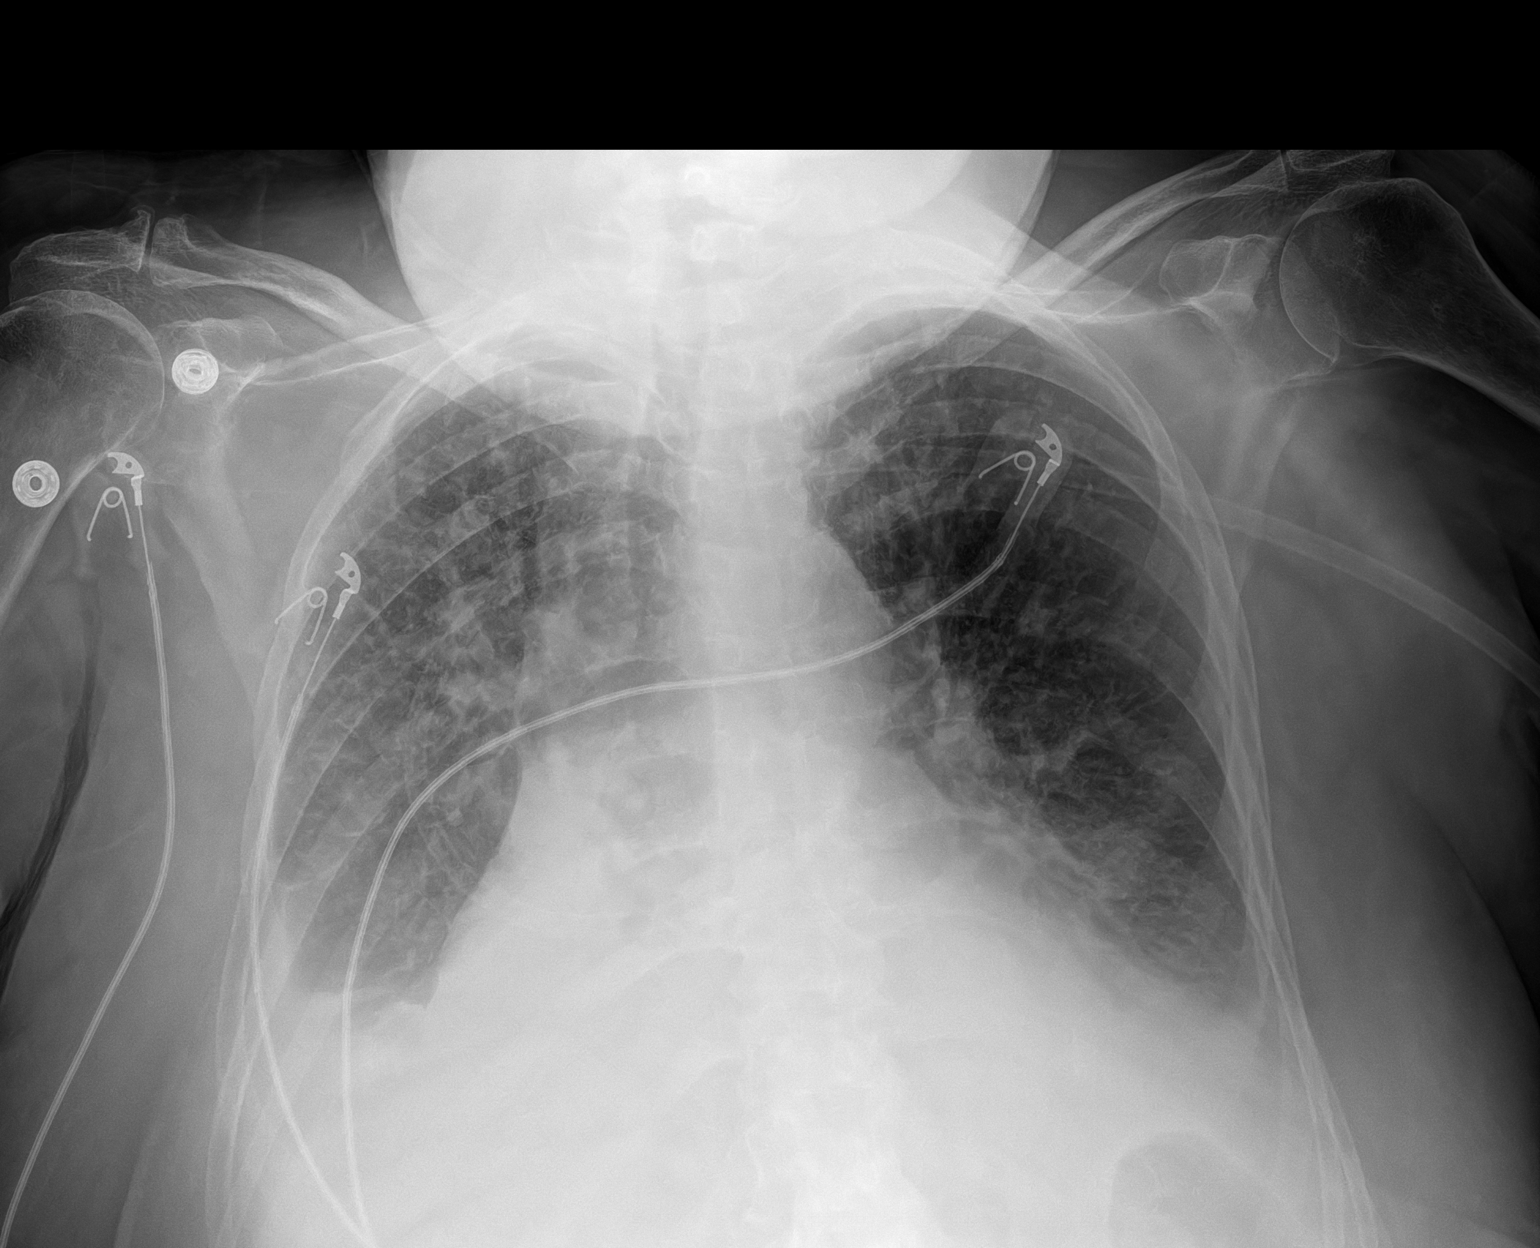

[1 of 1 positions shown; findings below may reference images not displayed]

FINDINGS: Increased basilar chest densities particularly on the right side.
Findings are suggestive for new pleural fluid. Heart is enlarged but
likely accentuated by the AP portable technique. Patient has a known
right lung mass that is poorly characterized on this examination.
Increased interstitial lung markings bilaterally are concerning for
mild edema. Negative for a pneumothorax.
IMPRESSION: 1. Increased basilar chest densities and slightly increased
interstitial lung markings. Findings are concerning for new
bilateral pleural effusions and mild interstitial edema.
2. Cannot exclude consolidation or airspace disease at the lung
bases.
3. Known right lung lesion is poorly characterized on this
examination.

## 2021-05-29 MED ORDER — FUROSEMIDE 10 MG/ML IJ SOLN
40.0000 mg | Freq: Once | INTRAMUSCULAR | Status: AC
  Administered 2021-05-29: 40 mg via INTRAVENOUS
  Filled 2021-05-29: qty 4

## 2021-05-29 MED ORDER — POTASSIUM CHLORIDE CRYS ER 20 MEQ PO TBCR
20.0000 meq | EXTENDED_RELEASE_TABLET | Freq: Once | ORAL | Status: AC
  Administered 2021-05-29: 20 meq via ORAL
  Filled 2021-05-29: qty 1

## 2021-05-29 NOTE — Progress Notes (Signed)
PROGRESS NOTE  Tammy Blair RSW:[id number] DOB: [birth year] DOA: 05/25/2021 PCP: [name], MD  Brief History:  History per Dr. Arlyce Dice  70 y.o. 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 Patient was brought to the ED with multiple complaints which included decreased responsiveness.  History is obtained from patient's daughter [name] who is at bedside as at the time of y evaluation patient is altered and unable to answer questions appropriately. Daughter reports symptoms started 2 days ago, and patient has rapidly declined since then.  Has been sleeping more, confused, unable to get out of bed, with reported increasing difficulty breathing, onset of productive cough that started yesterday.  She also complained of lower abdominal pain.  Blood sugar was checked this morning and was 44, patient was brought to the ED.   ED Course: Temperature 100.8, heart rate 98-113, respiratory rate 17-26, lactic acid 1.7 >> 2.5 .  O2 sats 91 to 95% on 6 L.  WBC 8.9.  Blood glucose 38.  CT Abd/pelvis with contrast-without acute intra-abdominal findings, CT chest w contrast-shows new patchy infiltrate in the left upper and lower lobe which has a somewhat masslike appearance in the lower lobe but likely related to extensive pneumonia.  Also persistent right upper lobe mass slightly smaller than prior CT.  2.5 L sepsis fluid bolus given.  IV ceftriaxone and azithromycin started.  D50  50 mils given.  Hospitalist to admit     Assessment/Plan:   Severe Sepsis  -present on admission- tachycardic to 113, febrile to 100.8, tachypneic to 26, with evidence of endorgan dysfunction lactic acidosis of 2.5 and acute on chronic respiratory failure.   -secondary to lobar pneumonia -CT chest shows left upper and lower lobe patchy infiltrates.  -COVID test negative -IV ceftriaxone and azithromycin  --continue -Follow-up blood and urine cultures -Trend lactic acid--peaked 2.5 -continue IVF>>saline lock -05/25/21 ABG--7.38/68/76/32 (0.44) -Patient is on chronic steroids-->continue IV steroids -MRSA screen negative -PCT 0.74>>0.47 -6/18--personally reviewed CXR--new bilateral pleural effusions; increased interstitial markings -lasix 40 IV x 1   Acute metabolic encephalopathy -secondary to sepsis, hypoglycemia in setting of metastatic brain cancer. -head CT--bilateral metastatic foci with interval improvement of vasogenic edema - mental status improving ,patient requesting her home benzos and narcotics- resumed.  -continues to have occasional episodes of agitation -6/14 UA neg for pyuria   Acute on chronic respiratory failure with hypercarbia and hypoxia -due to pneumonia and COPD exacerbation -O2 sats on my evaluation down to 89% on home 2-1/2 L,  -currently on 7 L maintaining sats greater than 93%. -wean oxygen back to baseline for saturation >90% -6/18--personally reviewed CXR--new bilateral pleural effusions; increased interstitial markings -lasix 40 IV x 1   COPD Exacerbation -continue pulmicort -continue duonebs -continue solumedrol IV -added brovana   Hypoglycemia , controlled diabetes mellitus -blood glucose down to 25 in the ED > 121> 79 >111  status post 50 mils D50.   -hypoglycemia resolved on steroids -from sepsis, relative adrenal insufficiencyand home insulins 70/30  30 units twice daily.  -04/08/21 A1c- 6.3. 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 Alvy Bimler.   -Completed palliative radiation therapy to the brain.   -Per last visit, patient is profoundly cushingoid and had classical signs of steroid-induced myopathy.  Steroid taper was started. -Palliative care consult -Hold  steroid taper for now, and start stress dose steroids with hydrocortisone   Hypertension - Hold  chlortalidone due to soft BPs initially - Resumed Cardizem decreased dose   Hypokalemia -replete -mag 2.1   Goals of Care -DNR -palliative medicine following             Status is: Inpatient   Remains inpatient appropriate because:Hemodynamically unstable   Dispo: The patient is from: Home              Anticipated d/c is to: Home              Patient currently is not medically stable to d/c.              Difficult to place patient No               Family Communication:   sister/niece updated 6/18   Consultants:  palliative   Code Status:  DNR   DVT Prophylaxis:[place] Lovenox     Procedures: As Listed in Progress Note Above   Antibiotics: Ceftriaxone 6/14>> Azithro 6/14>>     Subjective: Patient agitated overnight.  Denies f/c cp, sob, n/v/d, abd pain, headache  Objective: Vitals:   05/29/21 0900 05/29/21 1000 05/29/21 1100 05/29/21 1117  BP: (!) 142/55 (!) 145/58 (!) 135/42   Pulse: 72 71 76 80  Resp: 12 16 16 10   Temp:    98.1 F (36.7 C)  TempSrc:    Axillary  SpO2: 96% 96% 95% 96%  Weight:      Height:        Intake/Output Summary (Last 24 hours) at 05/29/2021 1317 Last data filed at 05/29/2021 0839 Gross per 24 hour  Intake 2210.61 ml  Output 1250 ml  Net 960.61 ml   Weight change:  Exam:  General:  Pt is alert, follows commands appropriately, not in acute distress HEENT: No icterus, No thrush, No neck mass, [place]/AT Cardiovascular: RRR, S1/S2, no rubs, no gallops Respiratory: bilateral scattered rhonchi;  bibasilar wheeze Abdomen: Soft/+BS, non tender, non distended, no guarding Extremities: 1+ LEedema, No lymphangitis, No petechiae, No rashes, no synovitis   Data Reviewed: I have personally reviewed following labs and imaging studies Basic Metabolic Panel: Recent Labs  Lab 05/25/21 1107 05/26/21 0504 05/27/21 0413 05/28/21 0518 05/29/21 0420  NA 132* 134* 136 138 136  K 3.2* 3.2* 2.6* 3.6 3.4*  CL 84* 93* 94* 94* 89*  CO2  36* 33* 36* 37* 40*  GLUCOSE 38* 105* 178* 197* 192*  BUN 26* 17 16 17 19   CREATININE 0.48 0.33* 0.32* 0.31* 0.30*  CALCIUM 8.6* 7.7* 7.8* 8.0* 8.0*  MG  --   --  2.1 2.0 2.2   Liver Function Tests: Recent Labs  Lab 05/25/21 1107 05/27/21 0413  AST 50* 44*  ALT 49* 49*  ALKPHOS 73 58  BILITOT 1.7* 0.3  PROT 6.8 5.5*  ALBUMIN 2.9* 1.9*   Recent Labs  Lab 05/25/21 1107  LIPASE 17   No results for input(s): AMMONIA in the last 168 hours. Coagulation Profile: Recent Labs  Lab 05/25/21 1107  INR 1.1   CBC: Recent Labs  Lab 05/25/21 1107 05/26/21 0504 05/27/21 0413 05/28/21 0518 05/29/21 0420  WBC 8.9 6.0 4.9 4.5 6.8  NEUTROABS 5.7  --   --   --   --   HGB 12.2 9.6* 9.0* 9.5* 9.7*  HCT 36.2 29.6* 27.5* 30.0* 29.4*  MCV 94.3 97.0 96.8 98.4 97.4  PLT 118* 73* 65* 80* 94*  Cardiac Enzymes: No results for input(s): CKTOTAL, CKMB, CKMBINDEX, TROPONINI in the last 168 hours. BNP: Invalid input(s): POCBNP CBG: Recent Labs  Lab 05/28/21 1121 05/28/21 1613 05/28/21 2139 05/29/21 0724 05/29/21 1116  GLUCAP 295* 370* 242* 210* 141*   HbA1C: No results for input(s): HGBA1C in the last 72 hours. Urine analysis:    Component Value Date/Time   COLORURINE YELLOW 05/25/2021 1403   APPEARANCEUR HAZY (A) 05/25/2021 1403   LABSPEC 1.013 05/25/2021 1403   PHURINE 6.0 05/25/2021 1403   GLUCOSEU 150 (A) 05/25/2021 1403   HGBUR MODERATE (A) 05/25/2021 1403   BILIRUBINUR NEGATIVE 05/25/2021 1403   KETONESUR NEGATIVE 05/25/2021 1403   PROTEINUR NEGATIVE 05/25/2021 1403   UROBILINOGEN 0.2 07/17/2014 1613   NITRITE NEGATIVE 05/25/2021 1403   LEUKOCYTESUR NEGATIVE 05/25/2021 1403   Sepsis Labs: @LABRCNTIP (procalcitonin:4,lacticidven:4) ) Recent Results (from the past 240 hour(s))  Resp Panel by RT-PCR (Flu A&B, Covid) Nasopharyngeal Swab     Status: None   Collection Time: 05/25/21 10:45 AM   Specimen: Nasopharyngeal Swab; Nasopharyngeal(NP) swabs in vial transport  medium  Result Value Ref Range Status   SARS Coronavirus 2 by RT PCR NEGATIVE NEGATIVE Final    Comment: (NOTE) SARS-CoV-2 target nucleic acids are NOT DETECTED.  The SARS-CoV-2 RNA is generally detectable in upper respiratory specimens during the acute phase of infection. The lowest concentration of SARS-CoV-2 viral copies this assay can detect is 138 copies/mL. A negative result does not preclude SARS-Cov-2 infection and should not be used as the sole basis for treatment or other patient management decisions. A negative result may occur with  improper specimen collection/handling, submission of specimen other than nasopharyngeal swab, presence of viral mutation(s) within the areas targeted by this assay, and inadequate number of viral copies(<138 copies/mL). A negative result must be combined with clinical observations, patient history, and epidemiological information. The expected result is Negative.  Fact Sheet for Patients:  EntrepreneurPulse.com.au  Fact Sheet for Healthcare Providers:  IncredibleEmployment.be  This test is no t yet approved or cleared by the [place] FDA and  has been authorized for detection and/or diagnosis of SARS-CoV-2 by FDA under an Emergency Use Authorization (EUA). This EUA will remain  in effect (meaning this test can be used) for the duration of the COVID-19 declaration under Section 564(b)(1) of the Act, 21 U.S.C.section 360bbb-3(b)(1), unless the authorization is terminated  or revoked sooner.       Influenza A by PCR NEGATIVE NEGATIVE Final   Influenza B by PCR NEGATIVE NEGATIVE Final    Comment: (NOTE) The Xpert Xpress SARS-CoV-2/FLU/RSV plus assay is intended as an aid in the diagnosis of influenza from Nasopharyngeal swab specimens and should not be used as a sole basis for treatment. Nasal washings and aspirates are unacceptable for Xpert Xpress SARS-CoV-2/FLU/RSV testing.  Fact Sheet for  Patients: EntrepreneurPulse.com.au  Fact Sheet for Healthcare Providers: IncredibleEmployment.be  This test is not yet approved or cleared by the [place] FDA and has been authorized for detection and/or diagnosis of SARS-CoV-2 by FDA under an Emergency Use Authorization (EUA). This EUA will remain in effect (meaning this test can be used) for the duration of the COVID-19 declaration under Section 564(b)(1) of the Act, 21 U.S.C. section 360bbb-3(b)(1), unless the authorization is terminated or revoked.  Performed at [facility], [address], [place], [place] [address]   Blood culture (routine single)     Status: None (Preliminary result)   Collection Time: 05/25/21 11:08 AM   Specimen: BLOOD  Result  Value Ref Range Status   Specimen Description BLOOD RIGHT ANTECUBITAL  Final   Special Requests   Final    Blood Culture results may not be optimal due to an inadequate volume of blood received in culture bottles BOTTLES DRAWN AEROBIC AND ANAEROBIC   Culture   Final    NO GROWTH 4 DAYS Performed at [facility], [address], [place], [place] [address]    Report Status PENDING  Incomplete  Culture, blood (single)     Status: None (Preliminary result)   Collection Time: 05/25/21 11:19 AM   Specimen: BLOOD  Result Value Ref Range Status   Specimen Description BLOOD LEFT ANTECUBITAL  Final   Special Requests   Final    Blood Culture adequate volume BOTTLES DRAWN AEROBIC AND ANAEROBIC   Culture   Final    NO GROWTH 4 DAYS Performed at [facility], [address] [place], [place] [address]    Report Status PENDING  Incomplete  Urine culture     Status: Abnormal   Collection Time: 05/25/21  2:04 PM   Specimen: Urine, Catheterized  Result Value Ref Range Status   Specimen Description   Final    URINE, CATHETERIZED Performed at [facility], [address], [place], [place] [address]    Special Requests   Final    NONE Performed at  [facility], [address], [place], [place] [address]    Culture (A)  Final    20,000 COLONIES/mL STREPTOCOCCUS AGALACTIAE TESTING AGAINST S. AGALACTIAE NOT ROUTINELY PERFORMED DUE TO PREDICTABILITY OF AMP/PEN/VAN SUSCEPTIBILITY. Performed at [facility], [place] [address] [place], [place] [address]    Report Status 05/27/2021 FINAL  Final  MRSA Next Gen by PCR, Nasal     Status: None   Collection Time: 05/25/21  5:52 PM   Specimen: Nasal Mucosa; Nasal Swab  Result Value Ref Range Status   MRSA by PCR Next Gen NOT DETECTED NOT DETECTED Final    Comment: (NOTE) The GeneXpert MRSA Assay (FDA approved for NASAL specimens only), is one component of a comprehensive MRSA colonization surveillance program. It is not intended to diagnose MRSA infection nor to guide or monitor treatment for MRSA infections. Test performance is not FDA approved in patients less than 79 years old. Performed at [facility], [address], [place], [place] [address]      Scheduled Meds:  arformoterol  15 mcg Nebulization BID   budesonide (PULMICORT) nebulizer solution  0.5 mg Nebulization BID   Chlorhexidine Gluconate Cloth  6 each Topical Daily   diltiazem  120 mg Oral Daily   enoxaparin (LOVENOX) injection  40 mg Subcutaneous Q24H   furosemide  40 mg Intravenous Once   insulin aspart  0-20 Units Subcutaneous TID WC   insulin aspart  0-5 Units Subcutaneous QHS   methylPREDNISolone (SOLU-MEDROL) injection  60 mg Intravenous Q12H   Continuous Infusions:  sodium chloride     azithromycin 500 mg (05/29/21 1122)   cefTRIAXone (ROCEPHIN)  IV 2 g (05/29/21 1032)    Procedures/Studies: CT HEAD WO CONTRAST  Result Date: 05/25/2021 CLINICAL DATA:  Altered mental status. History of lung cancer with brain metastasis. EXAM: CT HEAD WITHOUT CONTRAST TECHNIQUE: Contiguous axial images were obtained from the base of the skull through the vertex without intravenous contrast. COMPARISON:  CT head 04/07/2021.   MRI brain 04/08/2021 FINDINGS: Brain: Since the prior study, there is interval decrease of areas of vasogenic edema seen previously in the left parietal region, right  parietal region, and right occipital region. This suggest response to interval therapy. Vague areas of slightly increased attenuation are demonstrated corresponding to known metastatic lesions in the parietal regions bilaterally. No mass-effect or midline shift. Mild ventricular dilatation. Gray-white matter junctions are mostly distinct. Basal cisterns are not effaced. No acute intracranial hemorrhage is identified. Vascular: Intracranial arterial vascular calcifications. Skull: Calvarium appears intact. Sinuses/Orbits: Mild mucosal thickening in the paranasal sinuses. No acute air-fluid levels. Opacification of the mastoid air cells. Other: None. IMPRESSION: 1. Bilateral metastatic foci with interval improvement of vasogenic edema since prior study. 2. No acute intracranial hemorrhage or significant mass effect. 3. Bilateral mastoid effusions. Electronically Signed   By: Lucienne Capers M.D.   On: 05/25/2021 17:20   CT Chest W Contrast  Result Date: 05/25/2021 CLINICAL DATA:  Known history of lung carcinoma with metastatic disease and abnormal chest x-ray, initial encounter EXAM: CT CHEST WITH CONTRAST TECHNIQUE: Multidetector CT imaging of the chest was performed during intravenous contrast administration. CONTRAST:  30mL OMNIPAQUE IOHEXOL 300 MG/ML  SOLN COMPARISON:  Chest x-ray from earlier in the same day, CT from 04/08/2021 FINDINGS: Cardiovascular: Atherosclerotic calcifications of the thoracic aorta are noted. No aneurysmal dilatation or dissection is noted. Heart is at the upper limits of normal in size. Mild coronary calcifications are noted. No definitive emboli are seen although timing was not performed for embolus evaluation. Mediastinum/Nodes: Thoracic inlet is within normal limits. Scattered mediastinal adenopathy is noted most  prominent in the precarinal region measuring up to 2.8 cm slightly enlarged from the prior exam. Noted along the aortic arch measures 15 mm in short axis also slightly enlarged when compared with the prior exam. Bilateral hilar adenopathy is noted with a dominant 2.5 cm node stable in appearance in the right hilum. The esophagus as visualized is within normal limits. Right axillary adenopathy is noted slightly more prominent than that seen on the prior exam. Left-sided subpectoral adenopathy is noted. Lungs/Pleura: Lungs are well aerated bilaterally. The previously seen mass lesion along the major fissure is again identified with slightly reduced in size measuring approximately 2.7 x 2.5 cm. It previously measured up to 3.7 cm. Patchy infiltrative density is noted in the right lower lobe somewhat limited in evaluation due to patient motion artifact. Patchy infiltrate is seen in the left upper lobe along the fissure as well as in the left lower lobe also new from the prior exam. No sizable effusion is seen. Upper Abdomen: Mild nodularity to the liver is noted similar to that seen on prior CT. No other focal abnormality in the upper abdomen is noted. Musculoskeletal: Degenerative changes of the thoracic spine are seen. No acute bony abnormality is noted. IMPRESSION: Persistent right upper lobe mass although slightly smaller than that seen on the prior CT. Mediastinal adenopathy which is slightly more prominent than that seen on the prior exam. Bilateral hilar adenopathy is noted as well. Subpectoral adenopathy in right axillary adenopathy is noted in slightly more prominent than that noted on the prior exam. New patchy infiltrate in the left upper and lower lobe which has a somewhat masslike appearance in the lower lobe but likely related to extensive pneumonia given the relative abrupt onset. Cirrhotic changes of the liver similar to that seen on prior exam. Aortic Atherosclerosis (ICD10-I70.0). Electronically Signed    By: Inez Catalina M.D.   On: 05/25/2021 15:11   CT ABDOMEN PELVIS W CONTRAST  Result Date: 05/25/2021 CLINICAL DATA:  70 year old female with history of abdominal pain and fever.  History of lung cancer with metastatic disease to the brain, status post radiation therapy in May 2022. EXAM: CT ABDOMEN AND PELVIS WITH CONTRAST TECHNIQUE: Multidetector CT imaging of the abdomen and pelvis was performed using the standard protocol following bolus administration of intravenous contrast. CONTRAST:  172mL OMNIPAQUE IOHEXOL 300 MG/ML  SOLN COMPARISON:  CT the chest, abdomen and pelvis 04/08/2021. FINDINGS: Lower chest: There is soft tissue prominence in a peribronchovascular distribution in the lung bases bilaterally (left greater than right). In addition, widespread areas of airspace consolidation are noted in the lung bases bilaterally, most severe in the left lower lobe and inferior segment of the lingula. Atherosclerotic calcifications in the descending thoracic aorta as well as the right coronary artery. Hepatobiliary: Diffuse low attenuation throughout the hepatic parenchyma, indicative of a background of hepatic steatosis. Liver also has a shrunken appearance and nodular contour, indicative of underlying cirrhosis. No suspicious cystic or solid hepatic lesions. No intra or extrahepatic biliary ductal dilatation. Gallbladder is normal in appearance. Pancreas: No pancreatic mass. No pancreatic ductal dilatation. No pancreatic or peripancreatic fluid collections or inflammatory changes. Spleen: Unremarkable. Adrenals/Urinary Tract: Bilateral kidneys and adrenal glands are normal in appearance. No hydroureteronephrosis. Urinary bladder is normal in appearance. Stomach/Bowel: The appearance of the stomach is normal. There is no pathologic dilatation of small bowel or colon. Normal appendix. Vascular/Lymphatic: Aortic atherosclerosis, without evidence of aneurysm or dissection in the abdominal or pelvic vasculature.  There is some fusiform ectasia of the distal infrarenal abdominal aorta which measures up to 2.5 x 2.2 cm in diameter shortly before the aortic bifurcation. No lymphadenopathy noted in the abdomen or pelvis. Reproductive: Uterus and ovaries are atrophic. Other: No significant volume of ascites.  No pneumoperitoneum. Musculoskeletal: There are no aggressive appearing lytic or blastic lesions noted in the visualized portions of the skeleton. IMPRESSION: 1. There are no acute findings noted in the abdomen or pelvis to account for the patient's symptoms. 2. However, the visualized portions of the lung bases are very abnormal. Although much of these findings may relate to multilobar pneumonia, the profound soft tissue thickening in a peribronchovascular distribution in the lower lungs is very unusual in appearance and raises concern for potential lymphangitic spread of tumor. Further evaluation with contrast enhanced chest CT is recommended at this time to better evaluate the full extent of pulmonary disease. 3. Hepatic steatosis with hepatic cirrhosis. 4. Aortic atherosclerosis, in addition to at least right coronary artery disease. Please note that although the presence of coronary artery calcium documents the presence of coronary artery disease, the severity of this disease and any potential stenosis cannot be assessed on this non-gated CT examination. Assessment for potential risk factor modification, dietary therapy or pharmacologic therapy may be warranted, if clinically indicated. 5. Additional incidental findings, as above. Electronically Signed   By: Vinnie Langton M.D.   On: 05/25/2021 13:59   DG CHEST PORT 1 VIEW  Result Date: 05/29/2021 CLINICAL DATA:  COPD exacerbation. EXAM: PORTABLE CHEST 1 VIEW COMPARISON:  05/25/2021 CT and chest radiograph FINDINGS: Increased basilar chest densities particularly on the right side. Findings are suggestive for new pleural fluid. Heart is enlarged but likely  accentuated by the AP portable technique. Patient has a known right lung mass that is poorly characterized on this examination. Increased interstitial lung markings bilaterally are concerning for mild edema. Negative for a pneumothorax. IMPRESSION: 1. Increased basilar chest densities and slightly increased interstitial lung markings. Findings are concerning for new bilateral pleural effusions and mild interstitial edema.  2. Cannot exclude consolidation or airspace disease at the lung bases. 3. Known right lung lesion is poorly characterized on this examination. Electronically Signed   By: Markus Daft M.D.   On: 05/29/2021 09:01   DG Chest Port 1 View  Result Date: 05/25/2021 CLINICAL DATA:  Shortness of breath. EXAM: PORTABLE CHEST 1 VIEW COMPARISON:  04/07/2021 FINDINGS: Stable heart size. Redemonstrated right upper lobe lung mass measuring approximately 3.8 cm in diameter, stable to minimally progressed in size from prior. New patchy consolidation within the left mid to lower lung field. No pleural effusion or pneumothorax. IMPRESSION: 1. New patchy consolidation within the left mid to lower lung field concerning for pneumonia. 2. Stable to minimally progressed size of known right upper lobe lung mass. Electronically Signed   By: Davina Poke D.O.   On: 05/25/2021 11:04    Orson Eva, DO  [facility]  If 7PM-7AM, please contact night-coverage [email or web address] Password [email or web address] 05/29/2021, 1:17 PM   LOS: 4 days

## 2021-05-29 NOTE — Progress Notes (Signed)
Oxygen titrated down from 7L HFNC to 4L Nasal cannula. Pt tolerated well, will continue to monitor.

## 2021-05-30 DIAGNOSIS — J441 Chronic obstructive pulmonary disease with (acute) exacerbation: Secondary | ICD-10-CM | POA: Diagnosis not present

## 2021-05-30 DIAGNOSIS — J181 Lobar pneumonia, unspecified organism: Secondary | ICD-10-CM | POA: Diagnosis not present

## 2021-05-30 DIAGNOSIS — A419 Sepsis, unspecified organism: Secondary | ICD-10-CM | POA: Diagnosis not present

## 2021-05-30 DIAGNOSIS — G9341 Metabolic encephalopathy: Secondary | ICD-10-CM | POA: Diagnosis not present

## 2021-05-30 DIAGNOSIS — J9621 Acute and chronic respiratory failure with hypoxia: Secondary | ICD-10-CM | POA: Diagnosis not present

## 2021-05-30 LAB — CBC
HCT: 28.8 % — ABNORMAL LOW (ref 36.0–46.0)
Hemoglobin: 9.5 g/dL — ABNORMAL LOW (ref 12.0–15.0)
MCH: 32 pg (ref 26.0–34.0)
MCHC: 33 g/dL (ref 30.0–36.0)
MCV: 97 fL (ref 80.0–100.0)
Platelets: 108 10*3/uL — ABNORMAL LOW (ref 150–400)
RBC: 2.97 MIL/uL — ABNORMAL LOW (ref 3.87–5.11)
RDW: 13.8 % (ref 11.5–15.5)
WBC: 7.3 10*3/uL (ref 4.0–10.5)
nRBC: 0.5 % — ABNORMAL HIGH (ref 0.0–0.2)

## 2021-05-30 LAB — COMPREHENSIVE METABOLIC PANEL
ALT: 50 U/L — ABNORMAL HIGH (ref 0–44)
AST: 25 U/L (ref 15–41)
Albumin: 2.1 g/dL — ABNORMAL LOW (ref 3.5–5.0)
Alkaline Phosphatase: 72 U/L (ref 38–126)
Anion gap: 11 (ref 5–15)
BUN: 26 mg/dL — ABNORMAL HIGH (ref 8–23)
CO2: 43 mmol/L — ABNORMAL HIGH (ref 22–32)
Calcium: 8.3 mg/dL — ABNORMAL LOW (ref 8.9–10.3)
Chloride: 84 mmol/L — ABNORMAL LOW (ref 98–111)
Creatinine, Ser: 0.35 mg/dL — ABNORMAL LOW (ref 0.44–1.00)
GFR, Estimated: >60 mL/min (ref >60)
Glucose, Bld: 224 mg/dL — ABNORMAL HIGH (ref 70–99)
Potassium: 3.6 mmol/L (ref 3.5–5.1)
Sodium: 138 mmol/L (ref 135–145)
Total Bilirubin: 0.3 mg/dL (ref 0.3–1.2)
Total Protein: 5.6 g/dL — ABNORMAL LOW (ref 6.5–8.1)

## 2021-05-30 LAB — GLUCOSE, CAPILLARY
Glucose-Capillary: 220 mg/dL — ABNORMAL HIGH (ref 70–99)
Glucose-Capillary: 221 mg/dL — ABNORMAL HIGH (ref 70–99)
Glucose-Capillary: 169 mg/dL — ABNORMAL HIGH (ref 70–99)
Glucose-Capillary: 280 mg/dL — ABNORMAL HIGH (ref 70–99)
Glucose-Capillary: 309 mg/dL — ABNORMAL HIGH (ref 70–99)

## 2021-05-30 LAB — CULTURE, BLOOD (SINGLE)
Culture: NO GROWTH
Culture: NO GROWTH
Special Requests: ADEQUATE

## 2021-05-30 LAB — VITAMIN B12: Vitamin B-12: 537 pg/mL (ref 180–914)

## 2021-05-30 LAB — PHOSPHORUS: Phosphorus: 2.5 mg/dL (ref 2.5–4.6)

## 2021-05-30 LAB — FOLATE: Folate: 10.6 ng/mL (ref >5.9)

## 2021-05-30 LAB — MAGNESIUM: Magnesium: 2 mg/dL (ref 1.7–2.4)

## 2021-05-30 MED ORDER — FUROSEMIDE 10 MG/ML IJ SOLN
40.0000 mg | Freq: Once | INTRAMUSCULAR | Status: AC
  Administered 2021-05-30: 40 mg via INTRAVENOUS
  Filled 2021-05-30: qty 4

## 2021-05-30 NOTE — Progress Notes (Signed)
PROGRESS NOTE  Tammy Blair CZY:[id number] DOB: [birth year] DOA: 05/25/2021 PCP: [name], MD  Brief History:  History per Dr. Arlyce Dice  70 y.o. 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 Patient was brought to the ED with multiple complaints which included decreased responsiveness.  History is obtained from patient's daughter [name] who is at bedside as at the time of y evaluation patient is altered and unable to answer questions appropriately. Daughter reports symptoms started 2 days ago, and patient has rapidly declined since then.  Has been sleeping more, confused, unable to get out of bed, with reported increasing difficulty breathing, onset of productive cough that started yesterday.  She also complained of lower abdominal pain.  Blood sugar was checked this morning and was 44, patient was brought to the ED.   ED Course: Temperature 100.8, heart rate 98-113, respiratory rate 17-26, lactic acid 1.7 >> 2.5 .  O2 sats 91 to 95% on 6 L.  WBC 8.9.  Blood glucose 38.  CT Abd/pelvis with contrast-without acute intra-abdominal findings, CT chest w contrast-shows new patchy infiltrate in the left upper and lower lobe which has a somewhat masslike appearance in the lower lobe but likely related to extensive pneumonia.  Also persistent right upper lobe mass slightly smaller than prior CT.  2.5 L sepsis fluid bolus given.  IV ceftriaxone and azithromycin started.  D50  50 mils given.  Hospitalist to admit     Assessment/Plan:   Severe Sepsis  -present on admission- tachycardic to 113, febrile to 100.8, tachypneic to 26, with evidence of endorgan dysfunction lactic acidosis of 2.5 and acute on chronic respiratory failure.   -secondary to lobar pneumonia -CT chest shows left upper and lower lobe patchy infiltrates.  -COVID test negative -IV ceftriaxone and azithromycin  --continue -Follow-up blood and urine cultures -Trend lactic acid--peaked 2.5 -continue IVF>>saline lock -05/25/21 ABG--7.38/68/76/32 (0.44) -Patient is on chronic steroids-->continue IV steroids -MRSA screen negative -PCT 0.74>>0.47 -6/18--personally reviewed CXR--new bilateral pleural effusions; increased interstitial markings -lasix 40 IV x 1 on 6/18 and 0/93   Acute metabolic encephalopathy -secondary to sepsis, hypoglycemia in setting of metastatic brain cancer. -head CT--bilateral metastatic foci with interval improvement of vasogenic edema - mental status improving ,patient requesting her home benzos and narcotics- resumed.  -continues to have occasional episodes of agitation -6/14 UA neg for pyuria   Acute on chronic respiratory failure with hypercarbia and hypoxia -due to pneumonia and COPD exacerbation -O2 sats on my evaluation down to 89% on home 2-1/2 L,  -currently on 7 L maintaining sats greater than 93%. -wean oxygen back to baseline for saturation >90% -6/18--personally reviewed CXR--new bilateral pleural effusions; increased interstitial markings -lasix 40 IV x 1   COPD Exacerbation -continue pulmicort -continue duonebs -continue solumedrol IV -added brovana   Hypoglycemia , controlled diabetes mellitus -blood glucose down to 25 in the ED > 121> 79 >111  status post 50 mils D50.   -hypoglycemia resolved on steroids>>now hyperglycemic -from sepsis, relative adrenal insufficiencyand home insulins 70/30  30 units twice daily.  -04/08/21 A1c- 6.3. 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 Alvy Bimler.   -Completed palliative radiation therapy to the brain.   -Per last visit, patient is profoundly cushingoid and had classical signs of steroid-induced myopathy.  Steroid taper was started. -Palliative care consult -Hold steroid taper for now, and start stress dose  steroids with hydrocortisone   Hypertension - Hold chlortalidone due to soft BPs initially - Resumed Cardizem decreased dose   Hypokalemia -replete -mag 2.1   Goals of Care -DNR -palliative medicine following             Status is: Inpatient   Remains inpatient appropriate because:Hemodynamically unstable   Dispo: The patient is from: Home              Anticipated d/c is to: Home              Patient currently is not medically stable to d/c.              Difficult to place patient No               Family Communication:   sister/niece updated 6/19   Consultants:  palliative   Code Status:  DNR   DVT Prophylaxis:[place] Lovenox     Procedures: As Listed in Progress Note Above   Antibiotics: Ceftriaxone 6/14>> Azithro 6/14>>     Subjective: Patient denies fevers, chills, headache, chest pain, dyspnea, nausea, vomiting, diarrhea, abdominal pain, dysuria, hematuria, hematochezia, and melena.   Objective: Vitals:   05/30/21 1329 05/30/21 1400 05/30/21 1500 05/30/21 1613  BP:  (!) 153/61 (!) 145/80   Pulse: 86 79 84 79  Resp: (!) 25 16 18 18   Temp:    (!) 97.3 F (36.3 C)  TempSrc:    Oral  SpO2: 96% 96% 97% 99%  Weight:      Height:        Intake/Output Summary (Last 24 hours) at 05/30/2021 1706 Last data filed at 05/30/2021 1640 Gross per 24 hour  Intake 620 ml  Output 1750 ml  Net -1130 ml   Weight change:  Exam:  General:  Pt is alert, follows commands appropriately, not in acute distress HEENT: No icterus, No thrush, No neck mass, [place]/AT Cardiovascular: RRR, S1/S2, no rubs, no gallops Respiratory: bibasilar rhonchi and wheeze Abdomen: Soft/+BS, non tender, non distended, no guarding Extremities: 1+LE edema, No lymphangitis, No petechiae, No rashes, no synovitis   Data Reviewed: I have personally reviewed following labs and imaging studies Basic Metabolic Panel: Recent Labs  Lab 05/26/21 0504 05/27/21 0413 05/28/21 0518  05/29/21 0420 05/30/21 0454  NA 134* 136 138 136 138  K 3.2* 2.6* 3.6 3.4* 3.6  CL 93* 94* 94* 89* 84*  CO2 33* 36* 37* 40* 43*  GLUCOSE 105* 178* 197* 192* 224*  BUN 17 16 17 19  26*  CREATININE 0.33* 0.32* 0.31* 0.30* 0.35*  CALCIUM 7.7* 7.8* 8.0* 8.0* 8.3*  MG  --  2.1 2.0 2.2 2.0  PHOS  --   --   --   --  2.5   Liver Function Tests: Recent Labs  Lab 05/25/21 1107 05/27/21 0413 05/30/21 0454  AST 50* 44* 25  ALT 49* 49* 50*  ALKPHOS 73 58 72  BILITOT 1.7* 0.3 0.3  PROT 6.8 5.5* 5.6*  ALBUMIN 2.9* 1.9* 2.1*   Recent Labs  Lab 05/25/21 1107  LIPASE 17   No results for input(s): AMMONIA in the last 168 hours. Coagulation Profile: Recent Labs  Lab 05/25/21 1107  INR 1.1   CBC: Recent Labs  Lab 05/25/21 1107 05/26/21 0504 05/27/21 0413 05/28/21 0518 05/29/21 0420 05/30/21 0454  WBC 8.9 6.0 4.9 4.5 6.8 7.3  NEUTROABS 5.7  --   --   --   --   --  HGB 12.2 9.6* 9.0* 9.5* 9.7* 9.5*  HCT 36.2 29.6* 27.5* 30.0* 29.4* 28.8*  MCV 94.3 97.0 96.8 98.4 97.4 97.0  PLT 118* 73* 65* 80* 94* 108*   Cardiac Enzymes: No results for input(s): CKTOTAL, CKMB, CKMBINDEX, TROPONINI in the last 168 hours. BNP: Invalid input(s): POCBNP CBG: Recent Labs  Lab 05/29/21 2053 05/30/21 0341 05/30/21 0732 05/30/21 1101 05/30/21 1611  GLUCAP 279* 220* 221* 169* 280*   HbA1C: No results for input(s): HGBA1C in the last 72 hours. Urine analysis:    Component Value Date/Time   COLORURINE YELLOW 05/25/2021 1403   APPEARANCEUR HAZY (A) 05/25/2021 1403   LABSPEC 1.013 05/25/2021 1403   PHURINE 6.0 05/25/2021 1403   GLUCOSEU 150 (A) 05/25/2021 1403   HGBUR MODERATE (A) 05/25/2021 1403   BILIRUBINUR NEGATIVE 05/25/2021 1403   KETONESUR NEGATIVE 05/25/2021 1403   PROTEINUR NEGATIVE 05/25/2021 1403   UROBILINOGEN 0.2 07/17/2014 1613   NITRITE NEGATIVE 05/25/2021 1403   LEUKOCYTESUR NEGATIVE 05/25/2021 1403   Sepsis Labs: @LABRCNTIP (procalcitonin:4,lacticidven:4) ) Recent  Results (from the past 240 hour(s))  Resp Panel by RT-PCR (Flu A&B, Covid) Nasopharyngeal Swab     Status: None   Collection Time: 05/25/21 10:45 AM   Specimen: Nasopharyngeal Swab; Nasopharyngeal(NP) swabs in vial transport medium  Result Value Ref Range Status   SARS Coronavirus 2 by RT PCR NEGATIVE NEGATIVE Final    Comment: (NOTE) SARS-CoV-2 target nucleic acids are NOT DETECTED.  The SARS-CoV-2 RNA is generally detectable in upper respiratory specimens during the acute phase of infection. The lowest concentration of SARS-CoV-2 viral copies this assay can detect is 138 copies/mL. A negative result does not preclude SARS-Cov-2 infection and should not be used as the sole basis for treatment or other patient management decisions. A negative result may occur with  improper specimen collection/handling, submission of specimen other than nasopharyngeal swab, presence of viral mutation(s) within the areas targeted by this assay, and inadequate number of viral copies(<138 copies/mL). A negative result must be combined with clinical observations, patient history, and epidemiological information. The expected result is Negative.  Fact Sheet for Patients:  EntrepreneurPulse.com.au  Fact Sheet for Healthcare Providers:  IncredibleEmployment.be  This test is no t yet approved or cleared by the [place] FDA and  has been authorized for detection and/or diagnosis of SARS-CoV-2 by FDA under an Emergency Use Authorization (EUA). This EUA will remain  in effect (meaning this test can be used) for the duration of the COVID-19 declaration under Section 564(b)(1) of the Act, 21 U.S.C.section 360bbb-3(b)(1), unless the authorization is terminated  or revoked sooner.       Influenza A by PCR NEGATIVE NEGATIVE Final   Influenza B by PCR NEGATIVE NEGATIVE Final    Comment: (NOTE) The Xpert Xpress SARS-CoV-2/FLU/RSV plus assay is intended as an aid in the  diagnosis of influenza from Nasopharyngeal swab specimens and should not be used as a sole basis for treatment. Nasal washings and aspirates are unacceptable for Xpert Xpress SARS-CoV-2/FLU/RSV testing.  Fact Sheet for Patients: EntrepreneurPulse.com.au  Fact Sheet for Healthcare Providers: IncredibleEmployment.be  This test is not yet approved or cleared by the [place] FDA and has been authorized for detection and/or diagnosis of SARS-CoV-2 by FDA under an Emergency Use Authorization (EUA). This EUA will remain in effect (meaning this test can be used) for the duration of the COVID-19 declaration under Section 564(b)(1) of the Act, 21 U.S.C. section 360bbb-3(b)(1), unless the authorization is terminated or revoked.  Performed at [facility],  [address] [place], [place] [address]   Blood culture (routine single)     Status: None   Collection Time: 05/25/21 11:08 AM   Specimen: BLOOD  Result Value Ref Range Status   Specimen Description BLOOD RIGHT ANTECUBITAL  Final   Special Requests   Final    Blood Culture results may not be optimal due to an inadequate volume of blood received in culture bottles BOTTLES DRAWN AEROBIC AND ANAEROBIC   Culture   Final    NO GROWTH 5 DAYS Performed at [facility], [address] [place], [place] [address]    Report Status 05/30/2021 FINAL  Final  Culture, blood (single)     Status: None   Collection Time: 05/25/21 11:19 AM   Specimen: BLOOD  Result Value Ref Range Status   Specimen Description BLOOD LEFT ANTECUBITAL  Final   Special Requests   Final    Blood Culture adequate volume BOTTLES DRAWN AEROBIC AND ANAEROBIC   Culture   Final    NO GROWTH 5 DAYS Performed at [facility], [address], [place], [place] [address]    Report Status 05/30/2021 FINAL  Final  Urine culture     Status: Abnormal   Collection Time: 05/25/21  2:04 PM   Specimen: Urine, Catheterized  Result Value Ref Range  Status   Specimen Description   Final    URINE, CATHETERIZED Performed at [facility], [address] [place], [place] [address]    Special Requests   Final    NONE Performed at [facility], [address], [place], [place] [address]    Culture (A)  Final    20,000 COLONIES/mL STREPTOCOCCUS AGALACTIAE TESTING AGAINST S. AGALACTIAE NOT ROUTINELY PERFORMED DUE TO PREDICTABILITY OF AMP/PEN/VAN SUSCEPTIBILITY. Performed at [facility], [place] [address], [place], [place] [address]    Report Status 05/27/2021 FINAL  Final  MRSA Next Gen by PCR, Nasal     Status: None   Collection Time: 05/25/21  5:52 PM   Specimen: Nasal Mucosa; Nasal Swab  Result Value Ref Range Status   MRSA by PCR Next Gen NOT DETECTED NOT DETECTED Final    Comment: (NOTE) The GeneXpert MRSA Assay (FDA approved for NASAL specimens only), is one component of a comprehensive MRSA colonization surveillance program. It is not intended to diagnose MRSA infection nor to guide or monitor treatment for MRSA infections. Test performance is not FDA approved in patients less than 73 years old. Performed at [facility], [address], [place], [place] [address]      Scheduled Meds:  arformoterol  15 mcg Nebulization BID   budesonide (PULMICORT) nebulizer solution  0.5 mg Nebulization BID   Chlorhexidine Gluconate Cloth  6 each Topical Daily   diltiazem  120 mg Oral Daily   enoxaparin (LOVENOX) injection  40 mg Subcutaneous Q24H   insulin aspart  0-20 Units Subcutaneous TID WC   insulin aspart  0-5 Units Subcutaneous QHS   methylPREDNISolone (SOLU-MEDROL) injection  60 mg Intravenous Q12H   Continuous Infusions:  sodium chloride     azithromycin 500 mg (05/30/21 1131)   cefTRIAXone (ROCEPHIN)  IV 2 g (05/30/21 1040)    Procedures/Studies: CT HEAD WO CONTRAST  Result Date: 05/25/2021 CLINICAL DATA:  Altered mental status. History of lung cancer with brain metastasis. EXAM: CT HEAD WITHOUT CONTRAST TECHNIQUE:  Contiguous axial images were obtained from the base of the skull through the vertex without intravenous contrast. COMPARISON:  CT head 04/07/2021.  MRI brain 04/08/2021  FINDINGS: Brain: Since the prior study, there is interval decrease of areas of vasogenic edema seen previously in the left parietal region, right parietal region, and right occipital region. This suggest response to interval therapy. Vague areas of slightly increased attenuation are demonstrated corresponding to known metastatic lesions in the parietal regions bilaterally. No mass-effect or midline shift. Mild ventricular dilatation. Gray-white matter junctions are mostly distinct. Basal cisterns are not effaced. No acute intracranial hemorrhage is identified. Vascular: Intracranial arterial vascular calcifications. Skull: Calvarium appears intact. Sinuses/Orbits: Mild mucosal thickening in the paranasal sinuses. No acute air-fluid levels. Opacification of the mastoid air cells. Other: None. IMPRESSION: 1. Bilateral metastatic foci with interval improvement of vasogenic edema since prior study. 2. No acute intracranial hemorrhage or significant mass effect. 3. Bilateral mastoid effusions. Electronically Signed   By: Lucienne Capers M.D.   On: 05/25/2021 17:20   CT Chest W Contrast  Result Date: 05/25/2021 CLINICAL DATA:  Known history of lung carcinoma with metastatic disease and abnormal chest x-ray, initial encounter EXAM: CT CHEST WITH CONTRAST TECHNIQUE: Multidetector CT imaging of the chest was performed during intravenous contrast administration. CONTRAST:  15mL OMNIPAQUE IOHEXOL 300 MG/ML  SOLN COMPARISON:  Chest x-ray from earlier in the same day, CT from 04/08/2021 FINDINGS: Cardiovascular: Atherosclerotic calcifications of the thoracic aorta are noted. No aneurysmal dilatation or dissection is noted. Heart is at the upper limits of normal in size. Mild coronary calcifications are noted. No definitive emboli are seen although timing was  not performed for embolus evaluation. Mediastinum/Nodes: Thoracic inlet is within normal limits. Scattered mediastinal adenopathy is noted most prominent in the precarinal region measuring up to 2.8 cm slightly enlarged from the prior exam. Noted along the aortic arch measures 15 mm in short axis also slightly enlarged when compared with the prior exam. Bilateral hilar adenopathy is noted with a dominant 2.5 cm node stable in appearance in the right hilum. The esophagus as visualized is within normal limits. Right axillary adenopathy is noted slightly more prominent than that seen on the prior exam. Left-sided subpectoral adenopathy is noted. Lungs/Pleura: Lungs are well aerated bilaterally. The previously seen mass lesion along the major fissure is again identified with slightly reduced in size measuring approximately 2.7 x 2.5 cm. It previously measured up to 3.7 cm. Patchy infiltrative density is noted in the right lower lobe somewhat limited in evaluation due to patient motion artifact. Patchy infiltrate is seen in the left upper lobe along the fissure as well as in the left lower lobe also new from the prior exam. No sizable effusion is seen. Upper Abdomen: Mild nodularity to the liver is noted similar to that seen on prior CT. No other focal abnormality in the upper abdomen is noted. Musculoskeletal: Degenerative changes of the thoracic spine are seen. No acute bony abnormality is noted. IMPRESSION: Persistent right upper lobe mass although slightly smaller than that seen on the prior CT. Mediastinal adenopathy which is slightly more prominent than that seen on the prior exam. Bilateral hilar adenopathy is noted as well. Subpectoral adenopathy in right axillary adenopathy is noted in slightly more prominent than that noted on the prior exam. New patchy infiltrate in the left upper and lower lobe which has a somewhat masslike appearance in the lower lobe but likely related to extensive pneumonia given the  relative abrupt onset. Cirrhotic changes of the liver similar to that seen on prior exam. Aortic Atherosclerosis (ICD10-I70.0). Electronically Signed   By: Inez Catalina M.D.   On: 05/25/2021 15:11  CT ABDOMEN PELVIS W CONTRAST  Result Date: 05/25/2021 CLINICAL DATA:  70 year old female with history of abdominal pain and fever. History of lung cancer with metastatic disease to the brain, status post radiation therapy in May 2022. EXAM: CT ABDOMEN AND PELVIS WITH CONTRAST TECHNIQUE: Multidetector CT imaging of the abdomen and pelvis was performed using the standard protocol following bolus administration of intravenous contrast. CONTRAST:  166mL OMNIPAQUE IOHEXOL 300 MG/ML  SOLN COMPARISON:  CT the chest, abdomen and pelvis 04/08/2021. FINDINGS: Lower chest: There is soft tissue prominence in a peribronchovascular distribution in the lung bases bilaterally (left greater than right). In addition, widespread areas of airspace consolidation are noted in the lung bases bilaterally, most severe in the left lower lobe and inferior segment of the lingula. Atherosclerotic calcifications in the descending thoracic aorta as well as the right coronary artery. Hepatobiliary: Diffuse low attenuation throughout the hepatic parenchyma, indicative of a background of hepatic steatosis. Liver also has a shrunken appearance and nodular contour, indicative of underlying cirrhosis. No suspicious cystic or solid hepatic lesions. No intra or extrahepatic biliary ductal dilatation. Gallbladder is normal in appearance. Pancreas: No pancreatic mass. No pancreatic ductal dilatation. No pancreatic or peripancreatic fluid collections or inflammatory changes. Spleen: Unremarkable. Adrenals/Urinary Tract: Bilateral kidneys and adrenal glands are normal in appearance. No hydroureteronephrosis. Urinary bladder is normal in appearance. Stomach/Bowel: The appearance of the stomach is normal. There is no pathologic dilatation of small bowel or  colon. Normal appendix. Vascular/Lymphatic: Aortic atherosclerosis, without evidence of aneurysm or dissection in the abdominal or pelvic vasculature. There is some fusiform ectasia of the distal infrarenal abdominal aorta which measures up to 2.5 x 2.2 cm in diameter shortly before the aortic bifurcation. No lymphadenopathy noted in the abdomen or pelvis. Reproductive: Uterus and ovaries are atrophic. Other: No significant volume of ascites.  No pneumoperitoneum. Musculoskeletal: There are no aggressive appearing lytic or blastic lesions noted in the visualized portions of the skeleton. IMPRESSION: 1. There are no acute findings noted in the abdomen or pelvis to account for the patient's symptoms. 2. However, the visualized portions of the lung bases are very abnormal. Although much of these findings may relate to multilobar pneumonia, the profound soft tissue thickening in a peribronchovascular distribution in the lower lungs is very unusual in appearance and raises concern for potential lymphangitic spread of tumor. Further evaluation with contrast enhanced chest CT is recommended at this time to better evaluate the full extent of pulmonary disease. 3. Hepatic steatosis with hepatic cirrhosis. 4. Aortic atherosclerosis, in addition to at least right coronary artery disease. Please note that although the presence of coronary artery calcium documents the presence of coronary artery disease, the severity of this disease and any potential stenosis cannot be assessed on this non-gated CT examination. Assessment for potential risk factor modification, dietary therapy or pharmacologic therapy may be warranted, if clinically indicated. 5. Additional incidental findings, as above. Electronically Signed   By: Vinnie Langton M.D.   On: 05/25/2021 13:59   DG CHEST PORT 1 VIEW  Result Date: 05/29/2021 CLINICAL DATA:  COPD exacerbation. EXAM: PORTABLE CHEST 1 VIEW COMPARISON:  05/25/2021 CT and chest radiograph FINDINGS:  Increased basilar chest densities particularly on the right side. Findings are suggestive for new pleural fluid. Heart is enlarged but likely accentuated by the AP portable technique. Patient has a known right lung mass that is poorly characterized on this examination. Increased interstitial lung markings bilaterally are concerning for mild edema. Negative for a pneumothorax. IMPRESSION: 1. Increased  basilar chest densities and slightly increased interstitial lung markings. Findings are concerning for new bilateral pleural effusions and mild interstitial edema. 2. Cannot exclude consolidation or airspace disease at the lung bases. 3. Known right lung lesion is poorly characterized on this examination. Electronically Signed   By: Markus Daft M.D.   On: 05/29/2021 09:01   DG Chest Port 1 View  Result Date: 05/25/2021 CLINICAL DATA:  Shortness of breath. EXAM: PORTABLE CHEST 1 VIEW COMPARISON:  04/07/2021 FINDINGS: Stable heart size. Redemonstrated right upper lobe lung mass measuring approximately 3.8 cm in diameter, stable to minimally progressed in size from prior. New patchy consolidation within the left mid to lower lung field. No pleural effusion or pneumothorax. IMPRESSION: 1. New patchy consolidation within the left mid to lower lung field concerning for pneumonia. 2. Stable to minimally progressed size of known right upper lobe lung mass. Electronically Signed   By: Davina Poke D.O.   On: 05/25/2021 11:04    Orson Eva, DO  [facility]  If 7PM-7AM, please contact night-coverage [email or web address] Password [email or web address] 05/30/2021, 5:06 PM   LOS: 5 days

## 2021-05-31 DIAGNOSIS — J9621 Acute and chronic respiratory failure with hypoxia: Secondary | ICD-10-CM | POA: Diagnosis not present

## 2021-05-31 DIAGNOSIS — G9341 Metabolic encephalopathy: Secondary | ICD-10-CM | POA: Diagnosis not present

## 2021-05-31 DIAGNOSIS — Z7189 Other specified counseling: Secondary | ICD-10-CM | POA: Diagnosis not present

## 2021-05-31 DIAGNOSIS — L8990 Pressure ulcer of unspecified site, unspecified stage: Secondary | ICD-10-CM | POA: Insufficient documentation

## 2021-05-31 DIAGNOSIS — Z515 Encounter for palliative care: Secondary | ICD-10-CM | POA: Diagnosis not present

## 2021-05-31 DIAGNOSIS — J441 Chronic obstructive pulmonary disease with (acute) exacerbation: Secondary | ICD-10-CM | POA: Diagnosis not present

## 2021-05-31 DIAGNOSIS — A419 Sepsis, unspecified organism: Secondary | ICD-10-CM | POA: Diagnosis not present

## 2021-05-31 DIAGNOSIS — I10 Essential (primary) hypertension: Secondary | ICD-10-CM | POA: Diagnosis not present

## 2021-05-31 LAB — COMPREHENSIVE METABOLIC PANEL
ALT: 49 U/L — ABNORMAL HIGH (ref 0–44)
AST: 24 U/L (ref 15–41)
Albumin: 2.3 g/dL — ABNORMAL LOW (ref 3.5–5.0)
Alkaline Phosphatase: 70 U/L (ref 38–126)
Anion gap: 9 (ref 5–15)
BUN: 22 mg/dL (ref 8–23)
CO2: 44 mmol/L — ABNORMAL HIGH (ref 22–32)
Calcium: 8.5 mg/dL — ABNORMAL LOW (ref 8.9–10.3)
Chloride: 84 mmol/L — ABNORMAL LOW (ref 98–111)
Creatinine, Ser: 0.3 mg/dL — ABNORMAL LOW (ref 0.44–1.00)
GFR, Estimated: >60 mL/min (ref >60)
Glucose, Bld: 123 mg/dL — ABNORMAL HIGH (ref 70–99)
Potassium: 3.5 mmol/L (ref 3.5–5.1)
Sodium: 137 mmol/L (ref 135–145)
Total Bilirubin: 0.3 mg/dL (ref 0.3–1.2)
Total Protein: 5.5 g/dL — ABNORMAL LOW (ref 6.5–8.1)

## 2021-05-31 LAB — CBC
HCT: 30.2 % — ABNORMAL LOW (ref 36.0–46.0)
Hemoglobin: 10.1 g/dL — ABNORMAL LOW (ref 12.0–15.0)
MCH: 32 pg (ref 26.0–34.0)
MCHC: 33.4 g/dL (ref 30.0–36.0)
MCV: 95.6 fL (ref 80.0–100.0)
Platelets: 137 10*3/uL — ABNORMAL LOW (ref 150–400)
RBC: 3.16 MIL/uL — ABNORMAL LOW (ref 3.87–5.11)
RDW: 13.7 % (ref 11.5–15.5)
WBC: 9.6 10*3/uL (ref 4.0–10.5)
nRBC: 0.6 % — ABNORMAL HIGH (ref 0.0–0.2)

## 2021-05-31 LAB — GLUCOSE, CAPILLARY
Glucose-Capillary: 139 mg/dL — ABNORMAL HIGH (ref 70–99)
Glucose-Capillary: 95 mg/dL (ref 70–99)
Glucose-Capillary: 181 mg/dL — ABNORMAL HIGH (ref 70–99)
Glucose-Capillary: 331 mg/dL — ABNORMAL HIGH (ref 70–99)

## 2021-05-31 MED ORDER — PREDNISONE 20 MG PO TABS
60.0000 mg | ORAL_TABLET | Freq: Every day | ORAL | Status: DC
  Administered 2021-05-31 – 2021-06-01 (×2): 60 mg via ORAL
  Filled 2021-05-31 (×2): qty 3

## 2021-05-31 MED ORDER — AZITHROMYCIN 250 MG PO TABS
500.0000 mg | ORAL_TABLET | Freq: Every day | ORAL | Status: AC
  Administered 2021-05-31: 500 mg via ORAL
  Filled 2021-05-31: qty 2

## 2021-05-31 MED ORDER — TORSEMIDE 20 MG PO TABS
60.0000 mg | ORAL_TABLET | Freq: Once | ORAL | Status: AC
  Administered 2021-05-31: 60 mg via ORAL
  Filled 2021-05-31: qty 3

## 2021-05-31 MED ORDER — IPRATROPIUM-ALBUTEROL 0.5-2.5 (3) MG/3ML IN SOLN
3.0000 mL | Freq: Four times a day (QID) | RESPIRATORY_TRACT | Status: DC
  Administered 2021-05-31 – 2021-06-01 (×5): 3 mL via RESPIRATORY_TRACT
  Filled 2021-05-31 (×5): qty 3

## 2021-05-31 NOTE — Progress Notes (Signed)
PROGRESS NOTE  Tammy Blair KYH:[id number] DOB: 06-Feb-1951 DOA: 05/25/2021 PCP: [name], MD    Brief History:  History per Dr. Arlyce Dice  70 y.o. 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 Patient was brought to the ED with multiple complaints which included decreased responsiveness.  History is obtained from patient's daughter Tammy Blair who is at bedside as at the time of y evaluation patient is altered and unable to answer questions appropriately. Daughter reports symptoms started 2 days ago, and patient has rapidly declined since then.  Has been sleeping more, confused, unable to get out of bed, with reported increasing difficulty breathing, onset of productive cough that started yesterday.  She also complained of lower abdominal pain.  Blood sugar was checked this morning and was 44, patient was brought to the ED.   ED Course: Temperature 100.8, heart rate 98-113, respiratory rate 17-26, lactic acid 1.7 >> 2.5 .  O2 sats 91 to 95% on 6 L.  WBC 8.9.  Blood glucose 38.  CT Abd/pelvis with contrast-without acute intra-abdominal findings, CT chest w contrast-shows new patchy infiltrate in the left upper and lower lobe which has a somewhat masslike appearance in the lower lobe but likely related to extensive pneumonia.  Also persistent right upper lobe mass slightly smaller than prior CT.  2.5 L sepsis fluid bolus given.  IV ceftriaxone and azithromycin started.  D50  50 mils given.  Hospitalist to admit     Assessment/Plan:   Severe Sepsis  -present on admission- tachycardic to 113, febrile to 100.8, tachypneic to 26, with evidence of endorgan dysfunction lactic acidosis of 2.5 and acute on chronic respiratory failure.   -secondary to lobar pneumonia -CT chest shows left upper and lower lobe patchy infiltrates.  -COVID test negative -IV ceftriaxone and azithromycin  --continue -Follow-up blood and urine cultures -Trend lactic acid--peaked 2.5 -continue IVF>>saline lock -05/25/21 ABG--7.38/68/76/32 (0.44) -Patient is on chronic steroids-->continue IV steroids -MRSA screen negative -PCT 0.74>>0.47 -6/18--personally reviewed CXR--new bilateral pleural effusions; increased interstitial markings -lasix 40 IV x 1 on 6/18 and 6/19 -torsemide x 1 1/51   Acute metabolic encephalopathy -secondary to sepsis, hypoglycemia in setting of metastatic brain cancer. -head CT--bilateral metastatic foci with interval improvement of vasogenic edema - mental status improving ,patient requesting her home benzos and narcotics- resumed.  -continues to have occasional episodes of agitation -6/14 UA neg for pyuria -overall mental status is improved, but has intermittent episodes of agitation/confusion   Acute on chronic respiratory failure with hypercarbia and hypoxia -due to pneumonia and COPD exacerbation -O2 sats on my evaluation down to 89% on home 2-1/2 L,  -currently on 7 L maintaining sats greater than 93%. -wean oxygen back to baseline for saturation >90% -6/18--personally reviewed CXR--new bilateral pleural effusions; increased interstitial markings -05/31/21--now back to 3L   COPD Exacerbation -continue pulmicort -continue duonebs -continue solumedrol IV>>prednisone -added brovana   Hypoglycemia , controlled diabetes mellitus -blood glucose down to 25 in the ED > 121> 79 >111  status post 50 mils D50.   -hypoglycemia resolved on steroids>>now hyperglycemic -from sepsis, relative adrenal insufficiencyand home insulins 70/30  30 units twice daily.  -04/08/21 A1c- 6.3. 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 Alvy Bimler.   -Completed palliative radiation therapy to the brain.   -Per last  visit, patient is profoundly cushingoid and had classical signs of steroid-induced  myopathy.  Steroid taper was started. -Palliative care consult -Hold steroid taper for now, and start stress dose steroids with hydrocortisone   Hypertension - Hold chlortalidone due to soft BPs initially - Resumed Cardizem decreased dose   Hypokalemia -replete -mag 2.1   Goals of Care -DNR -palliative medicine following             Status is: Inpatient   Remains inpatient appropriate because:Hemodynamically unstable   Dispo: The patient is from: Home              Anticipated d/c is to: Home              Patient currently is not medically stable to d/c.              Difficult to place patient No               Family Communication:   sister/niece updated 6/19   Consultants:  palliative   Code Status:  DNR   DVT Prophylaxis:[place] Lovenox     Procedures: As Listed in Progress Note Above   Antibiotics: Ceftriaxone 6/14>> Azithro 6/14>>    Subjective: Patient denies fevers, chills, headache, chest pain, dyspnea, nausea, vomiting, diarrhea, abdominal pain, dysuria,    Objective: Vitals:   05/31/21 0436 05/31/21 0500 05/31/21 0600 05/31/21 0700  BP: (!) 159/66 (!) 147/60 (!) 154/66   Pulse: 71 62 66   Resp: 11 (!) 9 10   Temp: 98.8 F (37.1 C)   97.9 F (36.6 C)  TempSrc: Oral   Oral  SpO2: 100% 97% 97%   Weight: 77.8 kg     Height:        Intake/Output Summary (Last 24 hours) at 05/31/2021 0904 Last data filed at 05/30/2021 2000 Gross per 24 hour  Intake 470 ml  Output 900 ml  Net -430 ml   Weight change: 4 kg Exam:  General:  Pt is alert, follows commands appropriately, not in acute distress HEENT: No icterus, No thrush, No neck mass, [place]/AT Cardiovascular: RRR, S1/S2, no rubs, no gallops Respiratory: CTA bilaterally, no wheezing, no crackles, no rhonchi Abdomen: Soft/+BS, non tender, non distended, no guarding Extremities: No edema, No lymphangitis, No petechiae, No rashes, no synovitis   Data Reviewed: I have personally reviewed  following labs and imaging studies Basic Metabolic Panel: Recent Labs  Lab 05/27/21 0413 05/28/21 0518 05/29/21 0420 05/30/21 0454 05/31/21 0431  NA 136 138 136 138 137  K 2.6* 3.6 3.4* 3.6 3.5  CL 94* 94* 89* 84* 84*  CO2 36* 37* 40* 43* 44*  GLUCOSE 178* 197* 192* 224* 123*  BUN 16 17 19  26* 22  CREATININE 0.32* 0.31* 0.30* 0.35* 0.30*  CALCIUM 7.8* 8.0* 8.0* 8.3* 8.5*  MG 2.1 2.0 2.2 2.0  --   PHOS  --   --   --  2.5  --    Liver Function Tests: Recent Labs  Lab 05/25/21 1107 05/27/21 0413 05/30/21 0454 05/31/21 0431  AST 50* 44* 25 24  ALT 49* 49* 50* 49*  ALKPHOS 73 58 72 70  BILITOT 1.7* 0.3 0.3 0.3  PROT 6.8 5.5* 5.6* 5.5*  ALBUMIN 2.9* 1.9* 2.1* 2.3*   Recent Labs  Lab 05/25/21 1107  LIPASE 17   No results for input(s): AMMONIA in the last 168 hours. Coagulation Profile: Recent Labs  Lab 05/25/21 1107  INR 1.1   CBC: Recent Labs  Lab  05/25/21 1107 05/26/21 0504 05/27/21 0413 05/28/21 0518 05/29/21 0420 05/30/21 0454 05/31/21 0431  WBC 8.9   < > 4.9 4.5 6.8 7.3 9.6  NEUTROABS 5.7  --   --   --   --   --   --   HGB 12.2   < > 9.0* 9.5* 9.7* 9.5* 10.1*  HCT 36.2   < > 27.5* 30.0* 29.4* 28.8* 30.2*  MCV 94.3   < > 96.8 98.4 97.4 97.0 95.6  PLT 118*   < > 65* 80* 94* 108* 137*   < > = values in this interval not displayed.   Cardiac Enzymes: No results for input(s): CKTOTAL, CKMB, CKMBINDEX, TROPONINI in the last 168 hours. BNP: Invalid input(s): POCBNP CBG: Recent Labs  Lab 05/30/21 1101 05/30/21 1611 05/30/21 2115 05/31/21 0229 05/31/21 0716  GLUCAP 169* 280* 309* 139* 95   HbA1C: No results for input(s): HGBA1C in the last 72 hours. Urine analysis:    Component Value Date/Time   COLORURINE YELLOW 05/25/2021 1403   APPEARANCEUR HAZY (A) 05/25/2021 1403   LABSPEC 1.013 05/25/2021 1403   PHURINE 6.0 05/25/2021 1403   GLUCOSEU 150 (A) 05/25/2021 1403   HGBUR MODERATE (A) 05/25/2021 1403   BILIRUBINUR NEGATIVE 05/25/2021 1403    KETONESUR NEGATIVE 05/25/2021 1403   PROTEINUR NEGATIVE 05/25/2021 1403   UROBILINOGEN 0.2 07/17/2014 1613   NITRITE NEGATIVE 05/25/2021 1403   LEUKOCYTESUR NEGATIVE 05/25/2021 1403   Sepsis Labs: @LABRCNTIP (procalcitonin:4,lacticidven:4) ) Recent Results (from the past 240 hour(s))  Resp Panel by RT-PCR (Flu A&B, Covid) Nasopharyngeal Swab     Status: None   Collection Time: 05/25/21 10:45 AM   Specimen: Nasopharyngeal Swab; Nasopharyngeal(NP) swabs in vial transport medium  Result Value Ref Range Status   SARS Coronavirus 2 by RT PCR NEGATIVE NEGATIVE Final    Comment: (NOTE) SARS-CoV-2 target nucleic acids are NOT DETECTED.  The SARS-CoV-2 RNA is generally detectable in upper respiratory specimens during the acute phase of infection. The lowest concentration of SARS-CoV-2 viral copies this assay can detect is 138 copies/mL. A negative result does not preclude SARS-Cov-2 infection and should not be used as the sole basis for treatment or other patient management decisions. A negative result may occur with  improper specimen collection/handling, submission of specimen other than nasopharyngeal swab, presence of viral mutation(s) within the areas targeted by this assay, and inadequate number of viral copies(<138 copies/mL). A negative result must be combined with clinical observations, patient history, and epidemiological information. The expected result is Negative.  Fact Sheet for Patients:  EntrepreneurPulse.com.au  Fact Sheet for Healthcare Providers:  IncredibleEmployment.be  This test is no t yet approved or cleared by the [place] FDA and  has been authorized for detection and/or diagnosis of SARS-CoV-2 by FDA under an Emergency Use Authorization (EUA). This EUA will remain  in effect (meaning this test can be used) for the duration of the COVID-19 declaration under Section 564(b)(1) of the Act, 21 U.S.C.section  360bbb-3(b)(1), unless the authorization is terminated  or revoked sooner.       Influenza A by PCR NEGATIVE NEGATIVE Final   Influenza B by PCR NEGATIVE NEGATIVE Final    Comment: (NOTE) The Xpert Xpress SARS-CoV-2/FLU/RSV plus assay is intended as an aid in the diagnosis of influenza from Nasopharyngeal swab specimens and should not be used as a sole basis for treatment. Nasal washings and aspirates are unacceptable for Xpert Xpress SARS-CoV-2/FLU/RSV testing.  Fact Sheet for Patients: EntrepreneurPulse.com.au  Fact Sheet for Healthcare Providers:  IncredibleEmployment.be  This test is not yet approved or cleared by the [place] and has been authorized for detection and/or diagnosis of SARS-CoV-2 by FDA under an Emergency Use Authorization (EUA). This EUA will remain in effect (meaning this test can be used) for the duration of the COVID-19 declaration under Section 564(b)(1) of the Act, 21 U.S.C. section 360bbb-3(b)(1), unless the authorization is terminated or revoked.  Performed at [facility], [address], [place], [place] [address]   Blood culture (routine single)     Status: None   Collection Time: 05/25/21 11:08 AM   Specimen: BLOOD  Result Value Ref Range Status   Specimen Description BLOOD RIGHT ANTECUBITAL  Final   Special Requests   Final    Blood Culture results may not be optimal due to an inadequate volume of blood received in culture bottles BOTTLES DRAWN AEROBIC AND ANAEROBIC   Culture   Final    NO GROWTH 5 DAYS Performed at [facility], [address], [place], [place] [address]    Report Status 05/30/2021 FINAL  Final  Culture, blood (single)     Status: None   Collection Time: 05/25/21 11:19 AM   Specimen: BLOOD  Result Value Ref Range Status   Specimen Description BLOOD LEFT ANTECUBITAL  Final   Special Requests   Final    Blood Culture adequate volume BOTTLES DRAWN AEROBIC AND ANAEROBIC   Culture    Final    NO GROWTH 5 DAYS Performed at [facility], [address] [place], [place] [address]    Report Status 05/30/2021 FINAL  Final  Urine culture     Status: Abnormal   Collection Time: 05/25/21  2:04 PM   Specimen: Urine, Catheterized  Result Value Ref Range Status   Specimen Description   Final    URINE, CATHETERIZED Performed at [facility], [address], [place], [place] [address]    Special Requests   Final    NONE Performed at [facility], [address] [place], [place] [address]    Culture (A)  Final    20,000 COLONIES/mL STREPTOCOCCUS AGALACTIAE TESTING AGAINST S. AGALACTIAE NOT ROUTINELY PERFORMED DUE TO PREDICTABILITY OF AMP/PEN/VAN SUSCEPTIBILITY. Performed at [facility], [place] [address], [place], [place] [address]    Report Status 05/27/2021 FINAL  Final  MRSA Next Gen by PCR, Nasal     Status: None   Collection Time: 05/25/21  5:52 PM   Specimen: Nasal Mucosa; Nasal Swab  Result Value Ref Range Status   MRSA by PCR Next Gen NOT DETECTED NOT DETECTED Final    Comment: (NOTE) The GeneXpert MRSA Assay (FDA approved for NASAL specimens only), is one component of a comprehensive MRSA colonization surveillance program. It is not intended to diagnose MRSA infection nor to guide or monitor treatment for MRSA infections. Test performance is not FDA approved in patients less than 7 years old. Performed at [facility], [address] [place], [place] [address]      Scheduled Meds:  arformoterol  15 mcg Nebulization BID   budesonide (PULMICORT) nebulizer solution  0.5 mg Nebulization BID   Chlorhexidine Gluconate Cloth  6 each Topical Daily   diltiazem  120 mg Oral Daily   enoxaparin (LOVENOX) injection  40 mg Subcutaneous Q24H   insulin aspart  0-20 Units Subcutaneous TID WC   insulin aspart  0-5 Units Subcutaneous QHS   methylPREDNISolone (SOLU-MEDROL) injection  60 mg Intravenous Q12H   Continuous Infusions:  sodium chloride  azithromycin 500  mg (05/30/21 1131)   cefTRIAXone (ROCEPHIN)  IV 2 g (05/30/21 1040)    Procedures/Studies: CT HEAD WO CONTRAST  Result Date: 05/25/2021 CLINICAL DATA:  Altered mental status. History of lung cancer with brain metastasis. EXAM: CT HEAD WITHOUT CONTRAST TECHNIQUE: Contiguous axial images were obtained from the base of the skull through the vertex without intravenous contrast. COMPARISON:  CT head 04/07/2021.  MRI brain 04/08/2021 FINDINGS: Brain: Since the prior study, there is interval decrease of areas of vasogenic edema seen previously in the left parietal region, right parietal region, and right occipital region. This suggest response to interval therapy. Vague areas of slightly increased attenuation are demonstrated corresponding to known metastatic lesions in the parietal regions bilaterally. No mass-effect or midline shift. Mild ventricular dilatation. Gray-white matter junctions are mostly distinct. Basal cisterns are not effaced. No acute intracranial hemorrhage is identified. Vascular: Intracranial arterial vascular calcifications. Skull: Calvarium appears intact. Sinuses/Orbits: Mild mucosal thickening in the paranasal sinuses. No acute air-fluid levels. Opacification of the mastoid air cells. Other: None. IMPRESSION: 1. Bilateral metastatic foci with interval improvement of vasogenic edema since prior study. 2. No acute intracranial hemorrhage or significant mass effect. 3. Bilateral mastoid effusions. Electronically Signed   By: Lucienne Capers M.D.   On: 05/25/2021 17:20   CT Chest W Contrast  Result Date: 05/25/2021 CLINICAL DATA:  Known history of lung carcinoma with metastatic disease and abnormal chest x-ray, initial encounter EXAM: CT CHEST WITH CONTRAST TECHNIQUE: Multidetector CT imaging of the chest was performed during intravenous contrast administration. CONTRAST:  94mL OMNIPAQUE IOHEXOL 300 MG/ML  SOLN COMPARISON:  Chest x-ray from earlier in the same day, CT from 04/08/2021  FINDINGS: Cardiovascular: Atherosclerotic calcifications of the thoracic aorta are noted. No aneurysmal dilatation or dissection is noted. Heart is at the upper limits of normal in size. Mild coronary calcifications are noted. No definitive emboli are seen although timing was not performed for embolus evaluation. Mediastinum/Nodes: Thoracic inlet is within normal limits. Scattered mediastinal adenopathy is noted most prominent in the precarinal region measuring up to 2.8 cm slightly enlarged from the prior exam. Noted along the aortic arch measures 15 mm in short axis also slightly enlarged when compared with the prior exam. Bilateral hilar adenopathy is noted with a dominant 2.5 cm node stable in appearance in the right hilum. The esophagus as visualized is within normal limits. Right axillary adenopathy is noted slightly more prominent than that seen on the prior exam. Left-sided subpectoral adenopathy is noted. Lungs/Pleura: Lungs are well aerated bilaterally. The previously seen mass lesion along the major fissure is again identified with slightly reduced in size measuring approximately 2.7 x 2.5 cm. It previously measured up to 3.7 cm. Patchy infiltrative density is noted in the right lower lobe somewhat limited in evaluation due to patient motion artifact. Patchy infiltrate is seen in the left upper lobe along the fissure as well as in the left lower lobe also new from the prior exam. No sizable effusion is seen. Upper Abdomen: Mild nodularity to the liver is noted similar to that seen on prior CT. No other focal abnormality in the upper abdomen is noted. Musculoskeletal: Degenerative changes of the thoracic spine are seen. No acute bony abnormality is noted. IMPRESSION: Persistent right upper lobe mass although slightly smaller than that seen on the prior CT. Mediastinal adenopathy which is slightly more prominent than that seen on the prior exam. Bilateral hilar adenopathy is noted as well. Subpectoral  adenopathy in right axillary adenopathy  is noted in slightly more prominent than that noted on the prior exam. New patchy infiltrate in the left upper and lower lobe which has a somewhat masslike appearance in the lower lobe but likely related to extensive pneumonia given the relative abrupt onset. Cirrhotic changes of the liver similar to that seen on prior exam. Aortic Atherosclerosis (ICD10-I70.0). Electronically Signed   By: Inez Catalina M.D.   On: 05/25/2021 15:11   CT ABDOMEN PELVIS W CONTRAST  Result Date: 05/25/2021 CLINICAL DATA:  70 year old female with history of abdominal pain and fever. History of lung cancer with metastatic disease to the brain, status post radiation therapy in May 2022. EXAM: CT ABDOMEN AND PELVIS WITH CONTRAST TECHNIQUE: Multidetector CT imaging of the abdomen and pelvis was performed using the standard protocol following bolus administration of intravenous contrast. CONTRAST:  143mL OMNIPAQUE IOHEXOL 300 MG/ML  SOLN COMPARISON:  CT the chest, abdomen and pelvis 04/08/2021. FINDINGS: Lower chest: There is soft tissue prominence in a peribronchovascular distribution in the lung bases bilaterally (left greater than right). In addition, widespread areas of airspace consolidation are noted in the lung bases bilaterally, most severe in the left lower lobe and inferior segment of the lingula. Atherosclerotic calcifications in the descending thoracic aorta as well as the right coronary artery. Hepatobiliary: Diffuse low attenuation throughout the hepatic parenchyma, indicative of a background of hepatic steatosis. Liver also has a shrunken appearance and nodular contour, indicative of underlying cirrhosis. No suspicious cystic or solid hepatic lesions. No intra or extrahepatic biliary ductal dilatation. Gallbladder is normal in appearance. Pancreas: No pancreatic mass. No pancreatic ductal dilatation. No pancreatic or peripancreatic fluid collections or inflammatory changes. Spleen:  Unremarkable. Adrenals/Urinary Tract: Bilateral kidneys and adrenal glands are normal in appearance. No hydroureteronephrosis. Urinary bladder is normal in appearance. Stomach/Bowel: The appearance of the stomach is normal. There is no pathologic dilatation of small bowel or colon. Normal appendix. Vascular/Lymphatic: Aortic atherosclerosis, without evidence of aneurysm or dissection in the abdominal or pelvic vasculature. There is some fusiform ectasia of the distal infrarenal abdominal aorta which measures up to 2.5 x 2.2 cm in diameter shortly before the aortic bifurcation. No lymphadenopathy noted in the abdomen or pelvis. Reproductive: Uterus and ovaries are atrophic. Other: No significant volume of ascites.  No pneumoperitoneum. Musculoskeletal: There are no aggressive appearing lytic or blastic lesions noted in the visualized portions of the skeleton. IMPRESSION: 1. There are no acute findings noted in the abdomen or pelvis to account for the patient's symptoms. 2. However, the visualized portions of the lung bases are very abnormal. Although much of these findings may relate to multilobar pneumonia, the profound soft tissue thickening in a peribronchovascular distribution in the lower lungs is very unusual in appearance and raises concern for potential lymphangitic spread of tumor. Further evaluation with contrast enhanced chest CT is recommended at this time to better evaluate the full extent of pulmonary disease. 3. Hepatic steatosis with hepatic cirrhosis. 4. Aortic atherosclerosis, in addition to at least right coronary artery disease. Please note that although the presence of coronary artery calcium documents the presence of coronary artery disease, the severity of this disease and any potential stenosis cannot be assessed on this non-gated CT examination. Assessment for potential risk factor modification, dietary therapy or pharmacologic therapy may be warranted, if clinically indicated. 5. Additional  incidental findings, as above. Electronically Signed   By: Vinnie Langton M.D.   On: 05/25/2021 13:59   DG CHEST PORT 1 VIEW  Result Date: 05/29/2021 CLINICAL  DATA:  COPD exacerbation. EXAM: PORTABLE CHEST 1 VIEW COMPARISON:  05/25/2021 CT and chest radiograph FINDINGS: Increased basilar chest densities particularly on the right side. Findings are suggestive for new pleural fluid. Heart is enlarged but likely accentuated by the AP portable technique. Patient has a known right lung mass that is poorly characterized on this examination. Increased interstitial lung markings bilaterally are concerning for mild edema. Negative for a pneumothorax. IMPRESSION: 1. Increased basilar chest densities and slightly increased interstitial lung markings. Findings are concerning for new bilateral pleural effusions and mild interstitial edema. 2. Cannot exclude consolidation or airspace disease at the lung bases. 3. Known right lung lesion is poorly characterized on this examination. Electronically Signed   By: Markus Daft M.D.   On: 05/29/2021 09:01   DG Chest Port 1 View  Result Date: 05/25/2021 CLINICAL DATA:  Shortness of breath. EXAM: PORTABLE CHEST 1 VIEW COMPARISON:  04/07/2021 FINDINGS: Stable heart size. Redemonstrated right upper lobe lung mass measuring approximately 3.8 cm in diameter, stable to minimally progressed in size from prior. New patchy consolidation within the left mid to lower lung field. No pleural effusion or pneumothorax. IMPRESSION: 1. New patchy consolidation within the left mid to lower lung field concerning for pneumonia. 2. Stable to minimally progressed size of known right upper lobe lung mass. Electronically Signed   By: Davina Poke D.O.   On: 05/25/2021 11:04    Orson Eva, DO  [facility]  If 7PM-7AM, please contact night-coverage [email or web address] Password [email or web address] 05/31/2021, 9:04 AM   LOS: 6 days

## 2021-05-31 NOTE — Progress Notes (Addendum)
Multiple attempts to initiate IV access by 3 nurses, including ultrasound guidance. Unable to give IV Solumedrol at this time. MD notified. Patient may be in need of PICC or midline.   MD response: Deferred to AM team for PICC line, sign out to incoming team

## 2021-05-31 NOTE — Progress Notes (Signed)
Palliative: Tammy Blair is sitting up in bed with nursing staff at bedside.  She appears acutely/chronically ill and very frail, obese.  She is very hard of hearing, and she does finally tell me her name, but I do not ask for other orientation questions.  I am not sure that she can make her basic needs known.  There is no family at bedside at this time.  Call to daughter, Tammy Blair.  No answer, left voicemail message.  Conference with attending, bedside nursing staff, transition of care team related to patient condition, needs, goals of care, disposition home with hospice care.  Plan:    At this point Tammy Blair is planning to discharge home with hospice care.  It is unsure whether she will discharge to her daughter's home or her sister's home.  Transition of care team is working with family for final disposition location.  PMT to continue to reach out.  33 minutes  Quinn Axe, NP Palliative medicine team Team phone [phone] Greater than 50% of this time was spent counseling and coordinating care related to the above assessment and plan.

## 2021-05-31 NOTE — TOC Progression Note (Signed)
Transition of Care [facility]) - Progression Note    Patient Details  Name: Tammy Blair MRN: [id number] Date of Birth: 23-Aug-1951  Transition of Care [facility]) CM/SW Contact  Natasha Bence, LCSW Phone Number: 05/31/2021, 3:08 PM  Clinical Narrative:    Patient's family agreeable to [facility] hospice referral and requested hospital bed. Cassandra with [facility] agreeable to provide home hospice referral and provide hospital bed. Family agreed on discharge to sister's address at [place], [place]. TOC to follow.     Barriers to Discharge: Barriers Resolved  Expected Discharge Plan and Services                                                 Social Determinants of Health (SDOH) Interventions    Readmission Risk Interventions Readmission Risk Prevention Plan 04/14/2021  Transportation Screening Complete  PCP or Specialist Appt within 3-5 Days Complete  HRI or [place] Complete  Social Work Consult for [place] Planning/Counseling Complete  Palliative Care Screening Not Applicable  Medication Review [employer]) Complete  Some recent data might be hidden

## 2021-05-31 NOTE — Plan of Care (Signed)
  Problem: Acute Rehab PT Goals(only PT should resolve) Goal: Pt Will Go Supine/Side To Sit Outcome: Progressing Flowsheets (Taken 05/31/2021 1544) Pt will go Supine/Side to Sit:  with minimal assist  with min guard assist Goal: Patient Will Transfer Sit To/From Stand Outcome: Progressing Flowsheets (Taken 05/31/2021 1544) Patient will transfer sit to/from stand: with moderate assist Goal: Pt Will Transfer Bed To Chair/Chair To Bed Outcome: Progressing Flowsheets (Taken 05/31/2021 1544) Pt will Transfer Bed to Chair/Chair to Bed: with mod assist Goal: Pt Will Ambulate Outcome: Progressing Flowsheets (Taken 05/31/2021 1544) Pt will Ambulate:  10 feet  with moderate assist  with rolling walker   3:45 PM, 05/31/21 Lonell Grandchild, MPT Physical Therapist with [facility] [phone] office [phone] mobile phone

## 2021-05-31 NOTE — Evaluation (Signed)
Physical Therapy Evaluation Patient Details Name: Tammy Blair MRN: [id number] DOB: 1951-03-03 Today's Date: 05/31/2021   History of Present Illness  TRISTINA SAHAGIAN is a 70 y.o. 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  Patient was brought to the ED with multiple complaints which included decreased responsiveness.  History is obtained from patient's daughter [name] who is at bedside as at the time of y evaluation patient is altered and unable to answer questions appropriately.  Daughter reports symptoms started 2 days ago, and patient has rapidly declined since then.  Has been sleeping more, confused, unable to get out of bed, with reported increasing difficulty breathing, onset of productive cough that started yesterday.  She also complained of lower abdominal pain.  Blood sugar was checked this morning and was 44, patient was brought to the ED.   Clinical Impression  Patient demonstrates slow labored movement for sitting up at bedside requiring use of bed rail and Mod assist, at high risk for falls and limited to standing with RW for up to 20-30 seconds before having to sit due to BLE weakness and unable to lock knees.  Patient put back to bed requiring Mod/max assist to reposition.  Patient will benefit from continued physical therapy in hospital and recommended venue below to increase strength, balance, endurance for safe ADLs and gait.      Follow Up Recommendations SNF    Equipment Recommendations  None recommended by PT    Recommendations for Other Services       Precautions / Restrictions Precautions Precautions: Fall Restrictions Weight Bearing Restrictions: No      Mobility  Bed Mobility Overal bed mobility: Needs Assistance Bed Mobility: Supine to Sit;Sit to Supine     Supine to sit: Mod assist Sit to supine: Mod assist   General bed mobility comments: increased  time, labored movement    Transfers Overall transfer level: Needs assistance Equipment used: Rolling walker (2 wheeled) Transfers: Sit to/from Stand Sit to Stand: Max assist         General transfer comment: unable to lock knees due to weakness  Ambulation/Gait                Stairs            Wheelchair Mobility    Modified Rankin (Stroke Patients Only)       Balance Overall balance assessment: Needs assistance Sitting-balance support: Feet supported;No upper extremity supported Sitting balance-Leahy Scale: Fair Sitting balance - Comments: seated at EOB   Standing balance support: During functional activity;Bilateral upper extremity supported Standing balance-Leahy Scale: Poor Standing balance comment: using RW                             Pertinent Vitals/Pain Pain Assessment: No/denies pain    Home Living Family/patient expects to be discharged to:: Private residence Living Arrangements: Children Available Help at Discharge: Family;Available 24 hours/day Type of Home: Mobile home Home Access: Stairs to enter Entrance Stairs-Rails: Can reach both;Right;Left Entrance Stairs-Number of Steps: 8 Home Layout: One level Home Equipment: Walker - 4 wheels;Wheelchair - manual;Cane - single point;Bedside commode      Prior Function Level of Independence: Needs assistance   Gait / Transfers Assistance Needed: Household amulatory without AD; use of RW when going to doctor per pt report.  ADL's / Homemaking Assistance Needed: assisted by family  Comments: household ambulator     Hand Dominance   Dominant Hand: Right    Extremity/Trunk Assessment   Upper Extremity Assessment Upper Extremity Assessment: Generalized weakness    Lower Extremity Assessment Lower Extremity Assessment: Generalized weakness    Cervical / Trunk Assessment Cervical / Trunk Assessment: Normal  Communication   Communication: HOH  Cognition  Arousal/Alertness: Awake/alert Behavior During Therapy: WFL for tasks assessed/performed Overall Cognitive Status: No family/caregiver present to determine baseline cognitive functioning                                        General Comments      Exercises     Assessment/Plan    PT Assessment Patient needs continued PT services  PT Problem List Decreased strength;Decreased activity tolerance;Decreased balance;Decreased mobility       PT Treatment Interventions DME instruction;Gait training;Stair training;Functional mobility training;Therapeutic exercise;Therapeutic activities;Patient/family education;Balance training    PT Goals (Current goals can be found in the Care Plan section)  Acute Rehab PT Goals Patient Stated Goal: return home with family to assist PT Goal Formulation: With patient Time For Goal Achievement: 06/14/21 Potential to Achieve Goals: Fair    Frequency Min 3X/week   Barriers to discharge        Co-evaluation               AM-PAC PT "6 Clicks" Mobility  Outcome Measure Help needed turning from your back to your side while in a flat bed without using bedrails?: A Lot Help needed moving from lying on your back to sitting on the side of a flat bed without using bedrails?: A Lot Help needed moving to and from a bed to a chair (including a wheelchair)?: Total Help needed standing up from a chair using your arms (e.g., wheelchair or bedside chair)?: A Lot Help needed to walk in hospital room?: Total Help needed climbing 3-5 steps with a railing? : Total 6 Click Score: 9    End of Session Equipment Utilized During Treatment: Oxygen Activity Tolerance: Patient tolerated treatment well;Patient limited by fatigue Patient left: in bed;with call bell/phone within reach Nurse Communication: Mobility status PT Visit Diagnosis: Unsteadiness on feet (R26.81);Other abnormalities of gait and mobility (R26.89);Muscle weakness (generalized)  (M62.81)    Time: 2952-8413 PT Time Calculation (min) (ACUTE ONLY): 28 min   Charges:   PT Evaluation $PT Eval Moderate Complexity: 1 Mod PT Treatments $Therapeutic Activity: 23-37 mins        3:43 PM, 05/31/21 Lonell Grandchild, MPT Physical Therapist with [facility] [phone] office [phone] mobile phone

## 2021-06-01 DIAGNOSIS — A419 Sepsis, unspecified organism: Secondary | ICD-10-CM | POA: Diagnosis not present

## 2021-06-01 DIAGNOSIS — G9341 Metabolic encephalopathy: Secondary | ICD-10-CM | POA: Diagnosis not present

## 2021-06-01 DIAGNOSIS — J181 Lobar pneumonia, unspecified organism: Secondary | ICD-10-CM | POA: Diagnosis not present

## 2021-06-01 DIAGNOSIS — J441 Chronic obstructive pulmonary disease with (acute) exacerbation: Secondary | ICD-10-CM | POA: Diagnosis not present

## 2021-06-01 DIAGNOSIS — J9621 Acute and chronic respiratory failure with hypoxia: Secondary | ICD-10-CM | POA: Diagnosis not present

## 2021-06-01 LAB — COMPREHENSIVE METABOLIC PANEL
ALT: 44 U/L (ref 0–44)
AST: 22 U/L (ref 15–41)
Albumin: 2.5 g/dL — ABNORMAL LOW (ref 3.5–5.0)
Alkaline Phosphatase: 71 U/L (ref 38–126)
Anion gap: 14 (ref 5–15)
BUN: 21 mg/dL (ref 8–23)
CO2: 38 mmol/L — ABNORMAL HIGH (ref 22–32)
Calcium: 8.6 mg/dL — ABNORMAL LOW (ref 8.9–10.3)
Chloride: 85 mmol/L — ABNORMAL LOW (ref 98–111)
Creatinine, Ser: 0.39 mg/dL — ABNORMAL LOW (ref 0.44–1.00)
GFR, Estimated: >60 mL/min (ref >60)
Glucose, Bld: 155 mg/dL — ABNORMAL HIGH (ref 70–99)
Potassium: 3.3 mmol/L — ABNORMAL LOW (ref 3.5–5.1)
Sodium: 137 mmol/L (ref 135–145)
Total Bilirubin: 0.8 mg/dL (ref 0.3–1.2)
Total Protein: 6 g/dL — ABNORMAL LOW (ref 6.5–8.1)

## 2021-06-01 LAB — CBC
HCT: 34.4 % — ABNORMAL LOW (ref 36.0–46.0)
Hemoglobin: 11.1 g/dL — ABNORMAL LOW (ref 12.0–15.0)
MCH: 30.8 pg (ref 26.0–34.0)
MCHC: 32.3 g/dL (ref 30.0–36.0)
MCV: 95.6 fL (ref 80.0–100.0)
Platelets: 146 10*3/uL — ABNORMAL LOW (ref 150–400)
RBC: 3.6 MIL/uL — ABNORMAL LOW (ref 3.87–5.11)
RDW: 14.4 % (ref 11.5–15.5)
WBC: 10.8 10*3/uL — ABNORMAL HIGH (ref 4.0–10.5)
nRBC: 0.6 % — ABNORMAL HIGH (ref 0.0–0.2)

## 2021-06-01 LAB — GLUCOSE, CAPILLARY
Glucose-Capillary: 189 mg/dL — ABNORMAL HIGH (ref 70–99)
Glucose-Capillary: 314 mg/dL — ABNORMAL HIGH (ref 70–99)
Glucose-Capillary: 141 mg/dL — ABNORMAL HIGH (ref 70–99)
Glucose-Capillary: 99 mg/dL (ref 70–99)
Glucose-Capillary: 160 mg/dL — ABNORMAL HIGH (ref 70–99)

## 2021-06-01 MED ORDER — VALACYCLOVIR HCL 500 MG PO TABS
1000.0000 mg | ORAL_TABLET | Freq: Three times a day (TID) | ORAL | Status: DC
  Administered 2021-06-01: 1000 mg via ORAL
  Filled 2021-06-01: qty 2

## 2021-06-01 MED ORDER — KETOCONAZOLE 2 % EX CREA: TOPICAL_CREAM | Freq: Every day | CUTANEOUS | 0 refills | Status: AC

## 2021-06-01 MED ORDER — KETOCONAZOLE 2 % EX CREA
TOPICAL_CREAM | Freq: Every day | CUTANEOUS | Status: DC
  Filled 2021-06-01: qty 15

## 2021-06-01 MED ORDER — VALACYCLOVIR HCL 1 G PO TABS
1000.0000 mg | ORAL_TABLET | Freq: Three times a day (TID) | ORAL | 0 refills | Status: AC
Start: 2021-06-01 — End: ?

## 2021-06-01 MED ORDER — TORSEMIDE 20 MG PO TABS
40.0000 mg | ORAL_TABLET | Freq: Once | ORAL | Status: AC
  Administered 2021-06-01: 40 mg via ORAL
  Filled 2021-06-01: qty 2

## 2021-06-01 MED ORDER — DEXAMETHASONE 4 MG PO TABS: 2.0000 mg | ORAL_TABLET | Freq: Two times a day (BID) | ORAL | Status: AC

## 2021-06-01 NOTE — Progress Notes (Signed)
PT Cancellation Note  Patient Details Name: Tammy Blair MRN: [id number] DOB: December 06, 1951   Cancelled Treatment:    Reason Eval/Treat Not Completed: Other (comment)  Therapist knocked and entered room.  PT on the phone with her daughter and states, "Why are you sneaking up on me".  Therapist explained that she was not sneaking up on her that she was checking in on her and wanted to do some exercises.  PT states that she is going home by ambulance and she does not want to participate in therapy.   Rayetta Humphrey, PT CLT [phone]  06/01/2021, 11:57 AM

## 2021-06-01 NOTE — Progress Notes (Signed)
Patient found to be pulling off linens and clothes. CNA assisted with re-positioning patient and she was noted to have a rash in her left groin that is new. Patient did report some pain with this area, also new. Tammy Crosby, RN notified Dr. Carles Collet. Came to assess area.

## 2021-06-01 NOTE — Progress Notes (Signed)
Patient given a bath and dressings applied back to skin tears that patient had removed. Patient's daughter [name] called for an update, provided update. Stated patient is going home. Called about an hour later and wanted to know if the doctor had rounded and for him to call her. Left chat message for Dr. Carles Collet with her number.

## 2021-06-01 NOTE — Discharge Summary (Signed)
Physician Discharge Summary  Tammy Blair LNL:[id number] DOB: 06/18/1951 DOA: 05/25/2021  PCP: [name], MD  Admit date: 05/25/2021 Discharge date: 06/01/2021  Admitted From: Home Disposition:  Home   Recommendations for Outpatient Follow-up:  Follow up with PCP in 1-2 weeks Please obtain BMP/CBC in one week   Home Health: Home with hospice care services Equipment/Devices:Hospital Bed  Discharge Condition: Stable CODE STATUS: DNR Diet recommendation: Regular   Brief/Interim Summary: History per Dr. Arlyce Dice  70 y.o. 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 Patient was brought to the ED with multiple complaints which included decreased responsiveness.  History is obtained from patient's daughter Tammy Blair who is at bedside as at the time of y evaluation patient is altered and unable to answer questions appropriately. Daughter reports symptoms started 2 days ago, and patient has rapidly declined since then.  Has been sleeping more, confused, unable to get out of bed, with reported increasing difficulty breathing, onset of productive cough that started yesterday.  She also complained of lower abdominal pain.  Blood sugar was checked this morning and was 44, patient was brought to the ED.   ED Course: Temperature 100.8, heart rate 98-113, respiratory rate 17-26, lactic acid 1.7 >> 2.5 .  O2 sats 91 to 95% on 6 L.  WBC 8.9.  Blood glucose 38.  CT Abd/pelvis with contrast-without acute intra-abdominal findings, CT chest w contrast-shows new patchy infiltrate in the left upper and lower lobe which has a somewhat masslike appearance in the lower lobe but likely related to extensive pneumonia.  Also persistent right upper lobe mass slightly smaller than prior CT.  2.5 L sepsis fluid bolus given.  IV ceftriaxone and azithromycin started.  D50  50 mils given.  Hospitalist to  admit  Discharge Diagnoses:    Severe Sepsis  -present on admission- tachycardic to 113, febrile to 100.8, tachypneic to 26, with evidence of endorgan dysfunction lactic acidosis of 2.5 and acute on chronic respiratory failure.   -secondary to lobar pneumonia -CT chest shows left upper and lower lobe patchy infiltrates.  -COVID test negative -IV ceftriaxone and azithromycin --continue -Follow-up blood and urine cultures -Trend lactic acid--peaked 2.5 -continue IVF>>saline lock -05/25/21 ABG--7.38/68/76/32 (0.44) -Patient is on chronic steroids-->continue IV steroids -MRSA screen negative -PCT 0.74>>0.47 -6/18--personally reviewed CXR--new bilateral pleural effusions; increased interstitial markings -lasix 40 IV x 1 on 6/18 and 6/19 -torsemide x 1 6/20 -finished 7 days abx during the hospitalization   Acute metabolic encephalopathy -secondary to sepsis, hypoglycemia in setting of metastatic brain cancer. -head CT--bilateral metastatic foci with interval improvement of vasogenic edema - mental status improving ,patient requesting her home benzos and narcotics- resumed.  -continues to have occasional episodes of agitation -6/14 UA neg for pyuria -overall mental status is improved, but has intermittent episodes of agitation/confusion   Acute on chronic respiratory failure with hypercarbia and hypoxia -due to pneumonia and COPD exacerbation -O2 sats on my evaluation down to 89% on home 2-1/2 L,  -currently on 7 L maintaining sats greater than 93%. -wean oxygen back to baseline for saturation >90% -6/18--personally reviewed CXR--new bilateral pleural effusions; increased interstitial markings -05/31/21--now back to 3L   COPD Exacerbation -continue pulmicort -continue duonebs -continue solumedrol IV>>prednisone -added brovana -d/c home with prior dose dexamethasone   Hypoglycemia , controlled diabetes mellitus -blood glucose down to 25 in the ED > 121> 79 >111  status post 50  mils  D50.   -hypoglycemia resolved on steroids>>now hyperglycemic -from sepsis, relative adrenal insufficiencyand home insulins 70/30  30 units twice daily.  -04/08/21 A1c- 6.3. 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 Alvy Bimler.   -Completed palliative radiation therapy to the brain.   -Per last visit, patient is profoundly cushingoid and had classical signs of steroid-induced myopathy.  Steroid taper was started. -Palliative care consult -Hold steroid taper for now, and start stress dose steroids with hydrocortisone-->subsequently placed on IV solumedrol for COPD exacerbation -d/c home with prior dose of dexamethasone   Hypertension - Hold chlortalidone due to soft BPs initially--will not restart - Resumed Cardizem    Hypokalemia -replete -mag 2.1   Goals of Care -DNR -palliative medicine following>>home with hospice  Left groin rash -low suspicion of zoster -looks like moisture/maceration injury -rx valtrex x 1 week -rx ketoconazole cream which daughter states she has used in the past     Discharge Instructions   Allergies as of 06/01/2021   No Known Allergies      Medication List     STOP taking these medications    albuterol (2.5 MG/3ML) 0.083% nebulizer solution Commonly known as: PROVENTIL   albuterol 108 (90 Base) MCG/ACT inhaler Commonly known as: VENTOLIN HFA   ALPRAZolam 0.5 MG tablet Commonly known as: XANAX   amoxicillin-clavulanate 875-125 MG tablet Commonly known as: AUGMENTIN   aspirin 81 MG EC tablet   chlorthalidone 25 MG tablet Commonly known as: HYGROTON   cyclobenzaprine 10 MG tablet Commonly known as: FLEXERIL   nicotine 21 mg/24hr patch Commonly known as: NICODERM CQ - dosed in mg/24 hours   nystatin 100000 UNIT/ML suspension Commonly known as: MYCOSTATIN       TAKE these medications    atorvastatin 40 MG tablet Commonly known as:  LIPITOR Take 1 tablet (40 mg total) by mouth daily.   blood glucose meter kit and supplies Dispense based on patient and insurance preference. Use up to 3 times daily as directed. (FOR ICD-10 E10.9, E11.9).   Breztri Aerosphere 160-9-4.8 MCG/ACT Aero Generic drug: Budeson-Glycopyrrol-Formoterol Inhale 2 puffs into the lungs in the morning and at bedtime.   budesonide-formoterol 160-4.5 MCG/ACT inhaler Commonly known as: SYMBICORT Inhale 1 puff into the lungs 2 (two) times daily.   dexamethasone 4 MG tablet Commonly known as: DECADRON Take 0.5 tablets (2 mg total) by mouth 2 (two) times daily.   diltiazem 240 MG 24 hr capsule Commonly known as: CARDIZEM CD Take 1 capsule (240 mg total) by mouth daily.   famotidine 20 MG tablet Commonly known as: PEPCID Take 20 mg by mouth daily as needed.   gabapentin 300 MG capsule Commonly known as: NEURONTIN Take 1 capsule (300 mg total) by mouth 3 (three) times daily.   ketoconazole 2 % cream Commonly known as: NIZORAL Apply topically daily.   metFORMIN 500 MG 24 hr tablet Commonly known as: GLUCOPHAGE-XR Take 1 tablet (500 mg total) by mouth in the morning and at bedtime.   NovoLIN 70/30 FlexPen Relion (70-30) 100 UNIT/ML KwikPen Generic drug: insulin isophane & regular human Inject 30 units subcutaneously twice a day before breakfast and dinner   ondansetron 4 MG tablet Commonly known as: Zofran Take 1 tablet (4 mg total) by mouth daily as needed for nausea or vomiting.   oxyCODONE 5 MG immediate release tablet Commonly known as: Oxy IR/ROXICODONE TAKE (1) TABLET BY MOUTH EVERY 6 HOURS AS NEEDED FOR 14 DAYS.  Pen Needles 31G X 6 MM Misc 1 pen by Does not apply route in the morning and at bedtime.   umeclidinium bromide 62.5 MCG/INH Aepb Commonly known as: INCRUSE ELLIPTA Inhale 1 puff into the lungs daily.   valACYclovir 1000 MG tablet Commonly known as: VALTREX Take 1 tablet (1,000 mg total) by mouth 3 (three) times  daily.        No Known Allergies  Consultations: Palliative med   Procedures/Studies: CT HEAD WO CONTRAST  Result Date: 05/25/2021 CLINICAL DATA:  Altered mental status. History of lung cancer with brain metastasis. EXAM: CT HEAD WITHOUT CONTRAST TECHNIQUE: Contiguous axial images were obtained from the base of the skull through the vertex without intravenous contrast. COMPARISON:  CT head 04/07/2021.  MRI brain 04/08/2021 FINDINGS: Brain: Since the prior study, there is interval decrease of areas of vasogenic edema seen previously in the left parietal region, right parietal region, and right occipital region. This suggest response to interval therapy. Vague areas of slightly increased attenuation are demonstrated corresponding to known metastatic lesions in the parietal regions bilaterally. No mass-effect or midline shift. Mild ventricular dilatation. Gray-white matter junctions are mostly distinct. Basal cisterns are not effaced. No acute intracranial hemorrhage is identified. Vascular: Intracranial arterial vascular calcifications. Skull: Calvarium appears intact. Sinuses/Orbits: Mild mucosal thickening in the paranasal sinuses. No acute air-fluid levels. Opacification of the mastoid air cells. Other: None. IMPRESSION: 1. Bilateral metastatic foci with interval improvement of vasogenic edema since prior study. 2. No acute intracranial hemorrhage or significant mass effect. 3. Bilateral mastoid effusions. Electronically Signed   By: Lucienne Capers M.D.   On: 05/25/2021 17:20   CT Chest W Contrast  Result Date: 05/25/2021 CLINICAL DATA:  Known history of lung carcinoma with metastatic disease and abnormal chest x-ray, initial encounter EXAM: CT CHEST WITH CONTRAST TECHNIQUE: Multidetector CT imaging of the chest was performed during intravenous contrast administration. CONTRAST:  22mL OMNIPAQUE IOHEXOL 300 MG/ML  SOLN COMPARISON:  Chest x-ray from earlier in the same day, CT from 04/08/2021  FINDINGS: Cardiovascular: Atherosclerotic calcifications of the thoracic aorta are noted. No aneurysmal dilatation or dissection is noted. Heart is at the upper limits of normal in size. Mild coronary calcifications are noted. No definitive emboli are seen although timing was not performed for embolus evaluation. Mediastinum/Nodes: Thoracic inlet is within normal limits. Scattered mediastinal adenopathy is noted most prominent in the precarinal region measuring up to 2.8 cm slightly enlarged from the prior exam. Noted along the aortic arch measures 15 mm in short axis also slightly enlarged when compared with the prior exam. Bilateral hilar adenopathy is noted with a dominant 2.5 cm node stable in appearance in the right hilum. The esophagus as visualized is within normal limits. Right axillary adenopathy is noted slightly more prominent than that seen on the prior exam. Left-sided subpectoral adenopathy is noted. Lungs/Pleura: Lungs are well aerated bilaterally. The previously seen mass lesion along the major fissure is again identified with slightly reduced in size measuring approximately 2.7 x 2.5 cm. It previously measured up to 3.7 cm. Patchy infiltrative density is noted in the right lower lobe somewhat limited in evaluation due to patient motion artifact. Patchy infiltrate is seen in the left upper lobe along the fissure as well as in the left lower lobe also new from the prior exam. No sizable effusion is seen. Upper Abdomen: Mild nodularity to the liver is noted similar to that seen on prior CT. No other focal abnormality in the upper abdomen is noted.  Musculoskeletal: Degenerative changes of the thoracic spine are seen. No acute bony abnormality is noted. IMPRESSION: Persistent right upper lobe mass although slightly smaller than that seen on the prior CT. Mediastinal adenopathy which is slightly more prominent than that seen on the prior exam. Bilateral hilar adenopathy is noted as well. Subpectoral  adenopathy in right axillary adenopathy is noted in slightly more prominent than that noted on the prior exam. New patchy infiltrate in the left upper and lower lobe which has a somewhat masslike appearance in the lower lobe but likely related to extensive pneumonia given the relative abrupt onset. Cirrhotic changes of the liver similar to that seen on prior exam. Aortic Atherosclerosis (ICD10-I70.0). Electronically Signed   By: Inez Catalina M.D.   On: 05/25/2021 15:11   CT ABDOMEN PELVIS W CONTRAST  Result Date: 05/25/2021 CLINICAL DATA:  70 year old female with history of abdominal pain and fever. History of lung cancer with metastatic disease to the brain, status post radiation therapy in May 2022. EXAM: CT ABDOMEN AND PELVIS WITH CONTRAST TECHNIQUE: Multidetector CT imaging of the abdomen and pelvis was performed using the standard protocol following bolus administration of intravenous contrast. CONTRAST:  183mL OMNIPAQUE IOHEXOL 300 MG/ML  SOLN COMPARISON:  CT the chest, abdomen and pelvis 04/08/2021. FINDINGS: Lower chest: There is soft tissue prominence in a peribronchovascular distribution in the lung bases bilaterally (left greater than right). In addition, widespread areas of airspace consolidation are noted in the lung bases bilaterally, most severe in the left lower lobe and inferior segment of the lingula. Atherosclerotic calcifications in the descending thoracic aorta as well as the right coronary artery. Hepatobiliary: Diffuse low attenuation throughout the hepatic parenchyma, indicative of a background of hepatic steatosis. Liver also has a shrunken appearance and nodular contour, indicative of underlying cirrhosis. No suspicious cystic or solid hepatic lesions. No intra or extrahepatic biliary ductal dilatation. Gallbladder is normal in appearance. Pancreas: No pancreatic mass. No pancreatic ductal dilatation. No pancreatic or peripancreatic fluid collections or inflammatory changes. Spleen:  Unremarkable. Adrenals/Urinary Tract: Bilateral kidneys and adrenal glands are normal in appearance. No hydroureteronephrosis. Urinary bladder is normal in appearance. Stomach/Bowel: The appearance of the stomach is normal. There is no pathologic dilatation of small bowel or colon. Normal appendix. Vascular/Lymphatic: Aortic atherosclerosis, without evidence of aneurysm or dissection in the abdominal or pelvic vasculature. There is some fusiform ectasia of the distal infrarenal abdominal aorta which measures up to 2.5 x 2.2 cm in diameter shortly before the aortic bifurcation. No lymphadenopathy noted in the abdomen or pelvis. Reproductive: Uterus and ovaries are atrophic. Other: No significant volume of ascites.  No pneumoperitoneum. Musculoskeletal: There are no aggressive appearing lytic or blastic lesions noted in the visualized portions of the skeleton. IMPRESSION: 1. There are no acute findings noted in the abdomen or pelvis to account for the patient's symptoms. 2. However, the visualized portions of the lung bases are very abnormal. Although much of these findings may relate to multilobar pneumonia, the profound soft tissue thickening in a peribronchovascular distribution in the lower lungs is very unusual in appearance and raises concern for potential lymphangitic spread of tumor. Further evaluation with contrast enhanced chest CT is recommended at this time to better evaluate the full extent of pulmonary disease. 3. Hepatic steatosis with hepatic cirrhosis. 4. Aortic atherosclerosis, in addition to at least right coronary artery disease. Please note that although the presence of coronary artery calcium documents the presence of coronary artery disease, the severity of this disease and any potential  stenosis cannot be assessed on this non-gated CT examination. Assessment for potential risk factor modification, dietary therapy or pharmacologic therapy may be warranted, if clinically indicated. 5. Additional  incidental findings, as above. Electronically Signed   By: Vinnie Langton M.D.   On: 05/25/2021 13:59   DG CHEST PORT 1 VIEW  Result Date: 05/29/2021 CLINICAL DATA:  COPD exacerbation. EXAM: PORTABLE CHEST 1 VIEW COMPARISON:  05/25/2021 CT and chest radiograph FINDINGS: Increased basilar chest densities particularly on the right side. Findings are suggestive for new pleural fluid. Heart is enlarged but likely accentuated by the AP portable technique. Patient has a known right lung mass that is poorly characterized on this examination. Increased interstitial lung markings bilaterally are concerning for mild edema. Negative for a pneumothorax. IMPRESSION: 1. Increased basilar chest densities and slightly increased interstitial lung markings. Findings are concerning for new bilateral pleural effusions and mild interstitial edema. 2. Cannot exclude consolidation or airspace disease at the lung bases. 3. Known right lung lesion is poorly characterized on this examination. Electronically Signed   By: Markus Daft M.D.   On: 05/29/2021 09:01   DG Chest Port 1 View  Result Date: 05/25/2021 CLINICAL DATA:  Shortness of breath. EXAM: PORTABLE CHEST 1 VIEW COMPARISON:  04/07/2021 FINDINGS: Stable heart size. Redemonstrated right upper lobe lung mass measuring approximately 3.8 cm in diameter, stable to minimally progressed in size from prior. New patchy consolidation within the left mid to lower lung field. No pleural effusion or pneumothorax. IMPRESSION: 1. New patchy consolidation within the left mid to lower lung field concerning for pneumonia. 2. Stable to minimally progressed size of known right upper lobe lung mass. Electronically Signed   By: Davina Poke D.O.   On: 05/25/2021 11:04        Discharge Exam: Vitals:   06/01/21 0900 06/01/21 1356  BP:  136/78  Pulse:  99  Resp:  18  Temp:  98.3 F (36.8 C)  SpO2: 93% 94%   Vitals:   06/01/21 0459 06/01/21 0811 06/01/21 0900 06/01/21 1356  BP:  138/85   136/78  Pulse: 82   99  Resp: 19   18  Temp: 97.7 F (36.5 C)   98.3 F (36.8 C)  TempSrc: Oral   Oral  SpO2: 90% 92% 93% 94%  Weight:      Height:        General: Pt is alert, awake, not in acute distress Cardiovascular: RRR, S1/S2 +, no rubs, no gallops Respiratory: bibasilar rales. No wheeze Abdominal: Soft, NT, ND, bowel sounds + Extremities: 1+ LE edema, no cyanosis   The results of significant diagnostics from this hospitalization (including imaging, microbiology, ancillary and laboratory) are listed below for reference.    Significant Diagnostic Studies: CT HEAD WO CONTRAST  Result Date: 05/25/2021 CLINICAL DATA:  Altered mental status. History of lung cancer with brain metastasis. EXAM: CT HEAD WITHOUT CONTRAST TECHNIQUE: Contiguous axial images were obtained from the base of the skull through the vertex without intravenous contrast. COMPARISON:  CT head 04/07/2021.  MRI brain 04/08/2021 FINDINGS: Brain: Since the prior study, there is interval decrease of areas of vasogenic edema seen previously in the left parietal region, right parietal region, and right occipital region. This suggest response to interval therapy. Vague areas of slightly increased attenuation are demonstrated corresponding to known metastatic lesions in the parietal regions bilaterally. No mass-effect or midline shift. Mild ventricular dilatation. Gray-white matter junctions are mostly distinct. Basal cisterns are not effaced. No acute intracranial hemorrhage  is identified. Vascular: Intracranial arterial vascular calcifications. Skull: Calvarium appears intact. Sinuses/Orbits: Mild mucosal thickening in the paranasal sinuses. No acute air-fluid levels. Opacification of the mastoid air cells. Other: None. IMPRESSION: 1. Bilateral metastatic foci with interval improvement of vasogenic edema since prior study. 2. No acute intracranial hemorrhage or significant mass effect. 3. Bilateral mastoid effusions.  Electronically Signed   By: Lucienne Capers M.D.   On: 05/25/2021 17:20   CT Chest W Contrast  Result Date: 05/25/2021 CLINICAL DATA:  Known history of lung carcinoma with metastatic disease and abnormal chest x-ray, initial encounter EXAM: CT CHEST WITH CONTRAST TECHNIQUE: Multidetector CT imaging of the chest was performed during intravenous contrast administration. CONTRAST:  66mL OMNIPAQUE IOHEXOL 300 MG/ML  SOLN COMPARISON:  Chest x-ray from earlier in the same day, CT from 04/08/2021 FINDINGS: Cardiovascular: Atherosclerotic calcifications of the thoracic aorta are noted. No aneurysmal dilatation or dissection is noted. Heart is at the upper limits of normal in size. Mild coronary calcifications are noted. No definitive emboli are seen although timing was not performed for embolus evaluation. Mediastinum/Nodes: Thoracic inlet is within normal limits. Scattered mediastinal adenopathy is noted most prominent in the precarinal region measuring up to 2.8 cm slightly enlarged from the prior exam. Noted along the aortic arch measures 15 mm in short axis also slightly enlarged when compared with the prior exam. Bilateral hilar adenopathy is noted with a dominant 2.5 cm node stable in appearance in the right hilum. The esophagus as visualized is within normal limits. Right axillary adenopathy is noted slightly more prominent than that seen on the prior exam. Left-sided subpectoral adenopathy is noted. Lungs/Pleura: Lungs are well aerated bilaterally. The previously seen mass lesion along the major fissure is again identified with slightly reduced in size measuring approximately 2.7 x 2.5 cm. It previously measured up to 3.7 cm. Patchy infiltrative density is noted in the right lower lobe somewhat limited in evaluation due to patient motion artifact. Patchy infiltrate is seen in the left upper lobe along the fissure as well as in the left lower lobe also new from the prior exam. No sizable effusion is seen. Upper  Abdomen: Mild nodularity to the liver is noted similar to that seen on prior CT. No other focal abnormality in the upper abdomen is noted. Musculoskeletal: Degenerative changes of the thoracic spine are seen. No acute bony abnormality is noted. IMPRESSION: Persistent right upper lobe mass although slightly smaller than that seen on the prior CT. Mediastinal adenopathy which is slightly more prominent than that seen on the prior exam. Bilateral hilar adenopathy is noted as well. Subpectoral adenopathy in right axillary adenopathy is noted in slightly more prominent than that noted on the prior exam. New patchy infiltrate in the left upper and lower lobe which has a somewhat masslike appearance in the lower lobe but likely related to extensive pneumonia given the relative abrupt onset. Cirrhotic changes of the liver similar to that seen on prior exam. Aortic Atherosclerosis (ICD10-I70.0). Electronically Signed   By: Inez Catalina M.D.   On: 05/25/2021 15:11   CT ABDOMEN PELVIS W CONTRAST  Result Date: 05/25/2021 CLINICAL DATA:  70 year old female with history of abdominal pain and fever. History of lung cancer with metastatic disease to the brain, status post radiation therapy in May 2022. EXAM: CT ABDOMEN AND PELVIS WITH CONTRAST TECHNIQUE: Multidetector CT imaging of the abdomen and pelvis was performed using the standard protocol following bolus administration of intravenous contrast. CONTRAST:  128mL OMNIPAQUE IOHEXOL 300 MG/ML  SOLN COMPARISON:  CT the chest, abdomen and pelvis 04/08/2021. FINDINGS: Lower chest: There is soft tissue prominence in a peribronchovascular distribution in the lung bases bilaterally (left greater than right). In addition, widespread areas of airspace consolidation are noted in the lung bases bilaterally, most severe in the left lower lobe and inferior segment of the lingula. Atherosclerotic calcifications in the descending thoracic aorta as well as the right coronary artery.  Hepatobiliary: Diffuse low attenuation throughout the hepatic parenchyma, indicative of a background of hepatic steatosis. Liver also has a shrunken appearance and nodular contour, indicative of underlying cirrhosis. No suspicious cystic or solid hepatic lesions. No intra or extrahepatic biliary ductal dilatation. Gallbladder is normal in appearance. Pancreas: No pancreatic mass. No pancreatic ductal dilatation. No pancreatic or peripancreatic fluid collections or inflammatory changes. Spleen: Unremarkable. Adrenals/Urinary Tract: Bilateral kidneys and adrenal glands are normal in appearance. No hydroureteronephrosis. Urinary bladder is normal in appearance. Stomach/Bowel: The appearance of the stomach is normal. There is no pathologic dilatation of small bowel or colon. Normal appendix. Vascular/Lymphatic: Aortic atherosclerosis, without evidence of aneurysm or dissection in the abdominal or pelvic vasculature. There is some fusiform ectasia of the distal infrarenal abdominal aorta which measures up to 2.5 x 2.2 cm in diameter shortly before the aortic bifurcation. No lymphadenopathy noted in the abdomen or pelvis. Reproductive: Uterus and ovaries are atrophic. Other: No significant volume of ascites.  No pneumoperitoneum. Musculoskeletal: There are no aggressive appearing lytic or blastic lesions noted in the visualized portions of the skeleton. IMPRESSION: 1. There are no acute findings noted in the abdomen or pelvis to account for the patient's symptoms. 2. However, the visualized portions of the lung bases are very abnormal. Although much of these findings may relate to multilobar pneumonia, the profound soft tissue thickening in a peribronchovascular distribution in the lower lungs is very unusual in appearance and raises concern for potential lymphangitic spread of tumor. Further evaluation with contrast enhanced chest CT is recommended at this time to better evaluate the full extent of pulmonary disease. 3.  Hepatic steatosis with hepatic cirrhosis. 4. Aortic atherosclerosis, in addition to at least right coronary artery disease. Please note that although the presence of coronary artery calcium documents the presence of coronary artery disease, the severity of this disease and any potential stenosis cannot be assessed on this non-gated CT examination. Assessment for potential risk factor modification, dietary therapy or pharmacologic therapy may be warranted, if clinically indicated. 5. Additional incidental findings, as above. Electronically Signed   By: Vinnie Langton M.D.   On: 05/25/2021 13:59   DG CHEST PORT 1 VIEW  Result Date: 05/29/2021 CLINICAL DATA:  COPD exacerbation. EXAM: PORTABLE CHEST 1 VIEW COMPARISON:  05/25/2021 CT and chest radiograph FINDINGS: Increased basilar chest densities particularly on the right side. Findings are suggestive for new pleural fluid. Heart is enlarged but likely accentuated by the AP portable technique. Patient has a known right lung mass that is poorly characterized on this examination. Increased interstitial lung markings bilaterally are concerning for mild edema. Negative for a pneumothorax. IMPRESSION: 1. Increased basilar chest densities and slightly increased interstitial lung markings. Findings are concerning for new bilateral pleural effusions and mild interstitial edema. 2. Cannot exclude consolidation or airspace disease at the lung bases. 3. Known right lung lesion is poorly characterized on this examination. Electronically Signed   By: Markus Daft M.D.   On: 05/29/2021 09:01   DG Chest Port 1 View  Result Date: 05/25/2021 CLINICAL DATA:  Shortness of breath.  EXAM: PORTABLE CHEST 1 VIEW COMPARISON:  04/07/2021 FINDINGS: Stable heart size. Redemonstrated right upper lobe lung mass measuring approximately 3.8 cm in diameter, stable to minimally progressed in size from prior. New patchy consolidation within the left mid to lower lung field. No pleural effusion or  pneumothorax. IMPRESSION: 1. New patchy consolidation within the left mid to lower lung field concerning for pneumonia. 2. Stable to minimally progressed size of known right upper lobe lung mass. Electronically Signed   By: Davina Poke D.O.   On: 05/25/2021 11:04    Microbiology: Recent Results (from the past 240 hour(s))  Resp Panel by RT-PCR (Flu A&B, Covid) Nasopharyngeal Swab     Status: None   Collection Time: 05/25/21 10:45 AM   Specimen: Nasopharyngeal Swab; Nasopharyngeal(NP) swabs in vial transport medium  Result Value Ref Range Status   SARS Coronavirus 2 by RT PCR NEGATIVE NEGATIVE Final    Comment: (NOTE) SARS-CoV-2 target nucleic acids are NOT DETECTED.  The SARS-CoV-2 RNA is generally detectable in upper respiratory specimens during the acute phase of infection. The lowest concentration of SARS-CoV-2 viral copies this assay can detect is 138 copies/mL. A negative result does not preclude SARS-Cov-2 infection and should not be used as the sole basis for treatment or other patient management decisions. A negative result may occur with  improper specimen collection/handling, submission of specimen other than nasopharyngeal swab, presence of viral mutation(s) within the areas targeted by this assay, and inadequate number of viral copies(<138 copies/mL). A negative result must be combined with clinical observations, patient history, and epidemiological information. The expected result is Negative.  Fact Sheet for Patients:  EntrepreneurPulse.com.au  Fact Sheet for Healthcare Providers:  IncredibleEmployment.be  This test is no t yet approved or cleared by the [place] FDA and  has been authorized for detection and/or diagnosis of SARS-CoV-2 by FDA under an Emergency Use Authorization (EUA). This EUA will remain  in effect (meaning this test can be used) for the duration of the COVID-19 declaration under Section 564(b)(1) of the  Act, 21 U.S.C.section 360bbb-3(b)(1), unless the authorization is terminated  or revoked sooner.       Influenza A by PCR NEGATIVE NEGATIVE Final   Influenza B by PCR NEGATIVE NEGATIVE Final    Comment: (NOTE) The Xpert Xpress SARS-CoV-2/FLU/RSV plus assay is intended as an aid in the diagnosis of influenza from Nasopharyngeal swab specimens and should not be used as a sole basis for treatment. Nasal washings and aspirates are unacceptable for Xpert Xpress SARS-CoV-2/FLU/RSV testing.  Fact Sheet for Patients: EntrepreneurPulse.com.au  Fact Sheet for Healthcare Providers: IncredibleEmployment.be  This test is not yet approved or cleared by the [place] FDA and has been authorized for detection and/or diagnosis of SARS-CoV-2 by FDA under an Emergency Use Authorization (EUA). This EUA will remain in effect (meaning this test can be used) for the duration of the COVID-19 declaration under Section 564(b)(1) of the Act, 21 U.S.C. section 360bbb-3(b)(1), unless the authorization is terminated or revoked.  Performed at [facility], [address], [place], [place] [address]   Blood culture (routine single)     Status: None   Collection Time: 05/25/21 11:08 AM   Specimen: BLOOD  Result Value Ref Range Status   Specimen Description BLOOD RIGHT ANTECUBITAL  Final   Special Requests   Final    Blood Culture results may not be optimal due to an inadequate volume of blood received in culture bottles BOTTLES DRAWN AEROBIC AND ANAEROBIC   Culture  Final    NO GROWTH 5 DAYS Performed at [facility], [address], [place], [place] [address]    Report Status 05/30/2021 FINAL  Final  Culture, blood (single)     Status: None   Collection Time: 05/25/21 11:19 AM   Specimen: BLOOD  Result Value Ref Range Status   Specimen Description BLOOD LEFT ANTECUBITAL  Final   Special Requests   Final    Blood Culture adequate volume BOTTLES DRAWN AEROBIC AND  ANAEROBIC   Culture   Final    NO GROWTH 5 DAYS Performed at [facility], [address], [place], [place] [address]    Report Status 05/30/2021 FINAL  Final  Urine culture     Status: Abnormal   Collection Time: 05/25/21  2:04 PM   Specimen: Urine, Catheterized  Result Value Ref Range Status   Specimen Description   Final    URINE, CATHETERIZED Performed at [facility], [address], [place], [place] [address]    Special Requests   Final    NONE Performed at [facility], [address], [place], [place] [address]    Culture (A)  Final    20,000 COLONIES/mL STREPTOCOCCUS AGALACTIAE TESTING AGAINST S. AGALACTIAE NOT ROUTINELY PERFORMED DUE TO PREDICTABILITY OF AMP/PEN/VAN SUSCEPTIBILITY. Performed at [facility], [place] [address], [place], [place] [address]    Report Status 05/27/2021 FINAL  Final  MRSA Next Gen by PCR, Nasal     Status: None   Collection Time: 05/25/21  5:52 PM   Specimen: Nasal Mucosa; Nasal Swab  Result Value Ref Range Status   MRSA by PCR Next Gen NOT DETECTED NOT DETECTED Final    Comment: (NOTE) The GeneXpert MRSA Assay (FDA approved for NASAL specimens only), is one component of a comprehensive MRSA colonization surveillance program. It is not intended to diagnose MRSA infection nor to guide or monitor treatment for MRSA infections. Test performance is not FDA approved in patients less than 59 years old. Performed at [facility], [address] [place], [place] [address]      Labs: Basic Metabolic Panel: Recent Labs  Lab 05/27/21 0413 05/28/21 0518 05/29/21 0420 05/30/21 0454 05/31/21 0431 06/01/21 0541  NA 136 138 136 138 137 137  K 2.6* 3.6 3.4* 3.6 3.5 3.3*  CL 94* 94* 89* 84* 84* 85*  CO2 36* 37* 40* 43* 44* 38*  GLUCOSE 178* 197* 192* 224* 123* 155*  BUN $Re'16 17 19 'JyB$ 26* 22 21  CREATININE 0.32* 0.31* 0.30* 0.35* 0.30* 0.39*  CALCIUM 7.8* 8.0* 8.0* 8.3* 8.5* 8.6*  MG 2.1 2.0 2.2 2.0  --   --   PHOS  --   --   --  2.5  --   --     Liver Function Tests: Recent Labs  Lab 05/27/21 0413 05/30/21 0454 05/31/21 0431 06/01/21 0541  AST 44* $Remov'25 24 22  'MdDjkG$ ALT 49* 50* 49* 44  ALKPHOS 58 72 70 71  BILITOT 0.3 0.3 0.3 0.8  PROT 5.5* 5.6* 5.5* 6.0*  ALBUMIN 1.9* 2.1* 2.3* 2.5*   No results for input(s): LIPASE, AMYLASE in the last 168 hours. No results for input(s): AMMONIA in the last 168 hours. CBC: Recent Labs  Lab 05/28/21 0518 05/29/21 0420 05/30/21 0454 05/31/21 0431 06/01/21 0541  WBC 4.5 6.8 7.3 9.6 10.8*  HGB 9.5* 9.7* 9.5* 10.1* 11.1*  HCT 30.0* 29.4* 28.8* 30.2* 34.4*  MCV 98.4 97.4 97.0 95.6 95.6  PLT 80* 94* 108* 137* 146*  Cardiac Enzymes: No results for input(s): CKTOTAL, CKMB, CKMBINDEX, TROPONINI in the last 168 hours. BNP: Invalid input(s): POCBNP CBG: Recent Labs  Lab 05/31/21 1623 05/31/21 2020 05/31/21 2154 06/01/21 0740 06/01/21 1134  GLUCAP 189* 314* 331* 141* 99    Time coordinating discharge:  36 minutes  Signed:  Orson Eva, DO [facility] Pager: [phone] 06/01/2021, 2:38 PM

## 2021-06-01 NOTE — TOC Transition Note (Signed)
Transition of Care [facility]) - CM/SW Discharge Note   Patient Details  Name: Tammy Blair MRN: [id number] Date of Birth: 11-Sep-1951  [facility] CM/SW Contact:  Shade Flood, LCSW Phone Number: 06/01/2021, 2:40 PM   Clinical Narrative:     Pt able to dc home with hospice today per MD. Damaris Schooner with Cassandra at [facility] today and she has confirmed with family that DME is at the home pt will dc to and that family has brought pt's Home O2 concentrator to that home as well. Pt will need EMS transport home. Signed DNR form will need to go with her. Contacted EMS to request transport.  Updated RN. Attempted to update family though had to leave VMM. Will follow up if return call received.  There are no other TOC needs for dc.  Final next level of care: Home w Hospice Care Barriers to Discharge: Barriers Resolved   Patient Goals and CMS Choice Patient states their goals for this hospitalization and ongoing recovery are:: return home with hospice CMS Medicare.gov Compare Post Acute Care list provided to:: Patient Choice offered to / list presented to : Patient  Discharge Placement                Patient to be transferred to facility by: [facility] Name of family member notified: Ronald Lobo (Daughter)   [phone] Patient and family notified of of transfer: 05/31/21  Discharge Plan and Services                                     Social Determinants of Health (SDOH) Interventions     Readmission Risk Interventions Readmission Risk Prevention Plan 04/14/2021  Transportation Screening Complete  PCP or Specialist Appt within 3-5 Days Complete  HRI or Home Care Consult Complete  Social Work Consult for [place] Planning/Counseling Complete  Palliative Care Screening Not Applicable  Medication Review Press photographer) Complete  Some recent data might be hidden

## 2021-06-01 NOTE — Progress Notes (Signed)
Patient's daughter called, states she had two phones with her, one smart phone and one flip phone. The flip phone is at the bedside, is [employer] brand.  Called down to ICU to see if phone could have  been left down there if it was with her then. Patient belonging bag with just clothes. Patient does have a personal pillow listed on intake for personal belongings.

## 2021-06-01 NOTE — Progress Notes (Signed)
[facility] tech familiar with patient came up to floor, stated she did look for phone but could not find it. Suggested calling patient's boyfriend who visited often to see if he perhaps has it. Called but mailbox is full. Patient is alert enough to tell me that she did have the two phones but does not remember when last she had them both.

## 2021-06-01 NOTE — Progress Notes (Addendum)
Responded to nursing call:  new rash left groin RN report rash was not present in am 6/21 in left groin  Subjective: Patient unable to clarify how long rash has been present nor how long it has been symptomatic.  She states it hurst "a little bit".  Vitals:   06/01/21 0459 06/01/21 0811 06/01/21 0900 06/01/21 1356  BP: 138/85   136/78  Pulse: 82   99  Resp: 19   18  Temp: 97.7 F (36.5 C)   98.3 F (36.8 C)  TempSrc: Oral   Oral  SpO2: 90% 92% 93% 94%  Weight:      Height:       CV--RRR Lung--bibasilar rales        Assessment/Plan:  Left groin rash -appears to have a chronic component  where skin is healing -Zoster is in the differential diagnosis, but does not appear typical -?some maceration/skin breakdown -start valacyclovir empirically -if pt remains in hospital, she will need airborne isolation -discussed with daughter, [name] who stated that patient has had this "rash" before in the same area.  States that it occurs with moisture and being on antibiotics.  States she used ketoconazole cream in the past which helped   Orson Eva, DO [facility]

## 2021-06-01 NOTE — Progress Notes (Signed)
Patient has talked with family and some staff with very alert/oriented demeanor, but also removed her IV prior to shift, and has removed dressings from her skin tears and wounds through out the day. Has pulled off her o2 and tele leads multiple multiple times today. When speaking to patient she states "Have yall found my phone?" "You need to find my phone." Patient will remove the pads from under her for her incontinence, throw them in the floor, and proceed to urinate in the bed. Wheels are locked, bed is in the lowest position, bed alarm in place, call bell and flip phone within reach. Per report this is patient's usual behavior.

## 2021-06-04 ENCOUNTER — Other Ambulatory Visit: Payer: Medicare Other | Admitting: Nurse Practitioner

## 2021-06-04 ENCOUNTER — Other Ambulatory Visit (HOSPITAL_COMMUNITY): Payer: Medicare Other

## 2021-06-04 ENCOUNTER — Other Ambulatory Visit: Payer: Self-pay

## 2021-06-07 ENCOUNTER — Ambulatory Visit (HOSPITAL_COMMUNITY): Payer: Medicare Other | Admitting: Hematology

## 2021-06-07 ENCOUNTER — Ambulatory Visit (HOSPITAL_COMMUNITY): Payer: Medicare Other

## 2021-06-07 ENCOUNTER — Other Ambulatory Visit (HOSPITAL_COMMUNITY): Payer: Medicare Other

## 2021-06-09 NOTE — Progress Notes (Signed)
  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  The patient was immobilized with a thermoplastic mask and isocenter was verified with weekly port films.  Narrative: The patient tolerated radiation treatment relatively well.   She did report frontal headache, blurry vision and fatigue despite decadron. She also developed thrush which was treated appropriately and resolved.  Plan: The patient has completed radiation treatment. The patient will return to radiation oncology clinic for routine followup in one month. I advised them to call or return sooner if they have any questions or concerns related to their recovery or treatment. ________________________________  Sheral Apley. [name], M.D.

## 2021-06-10 ENCOUNTER — Ambulatory Visit: Payer: Medicare Other | Admitting: Urology

## 2021-07-05 ENCOUNTER — Ambulatory Visit: Payer: Medicare Other | Admitting: Pulmonary Disease

## 2021-07-07 ENCOUNTER — Ambulatory Visit: Payer: Medicare Other | Admitting: Pulmonary Disease

## 2021-07-12 DEATH — deceased
